# Patient Record
Sex: Female | Born: 1955 | Race: White | Hispanic: No | Marital: Married | Smoking: Former smoker
Health system: Southern US, Community
[De-identification: ages and names within clinical notes are randomized; demographics above are authoritative.]

## PROBLEM LIST (undated history)

## (undated) DIAGNOSIS — J310 Chronic rhinitis: Secondary | ICD-10-CM

## (undated) DIAGNOSIS — M797 Fibromyalgia: Secondary | ICD-10-CM

## (undated) DIAGNOSIS — E785 Hyperlipidemia, unspecified: Secondary | ICD-10-CM

## (undated) DIAGNOSIS — M810 Age-related osteoporosis without current pathological fracture: Secondary | ICD-10-CM

## (undated) DIAGNOSIS — E559 Vitamin D deficiency, unspecified: Secondary | ICD-10-CM

## (undated) DIAGNOSIS — C539 Malignant neoplasm of cervix uteri, unspecified: Secondary | ICD-10-CM

## (undated) DIAGNOSIS — E8801 Alpha-1-antitrypsin deficiency: Secondary | ICD-10-CM

## (undated) DIAGNOSIS — Z720 Tobacco use: Secondary | ICD-10-CM

## (undated) DIAGNOSIS — Z205 Contact with and (suspected) exposure to viral hepatitis: Secondary | ICD-10-CM

## (undated) DIAGNOSIS — G4733 Obstructive sleep apnea (adult) (pediatric): Secondary | ICD-10-CM

## (undated) DIAGNOSIS — K589 Irritable bowel syndrome without diarrhea: Secondary | ICD-10-CM

## (undated) DIAGNOSIS — J8410 Pulmonary fibrosis, unspecified: Secondary | ICD-10-CM

## (undated) DIAGNOSIS — K219 Gastro-esophageal reflux disease without esophagitis: Secondary | ICD-10-CM

## (undated) DIAGNOSIS — J449 Chronic obstructive pulmonary disease, unspecified: Secondary | ICD-10-CM

## (undated) HISTORY — DX: Fibromyalgia: M79.7

## (undated) HISTORY — DX: Age-related osteoporosis without current pathological fracture: M81.0

## (undated) HISTORY — DX: Alpha-1-antitrypsin deficiency: E88.01

## (undated) HISTORY — DX: Tobacco use: Z72.0

## (undated) HISTORY — PX: LUMBAR DISC SURGERY: SHX700

## (undated) HISTORY — DX: Contact with and (suspected) exposure to viral hepatitis: Z20.5

## (undated) HISTORY — DX: Malignant neoplasm of cervix uteri, unspecified: C53.9

## (undated) HISTORY — DX: Chronic rhinitis: J31.0

## (undated) HISTORY — DX: Vitamin D deficiency, unspecified: E55.9

## (undated) HISTORY — DX: Obstructive sleep apnea (adult) (pediatric): G47.33

## (undated) HISTORY — PX: ABDOMINAL HYSTERECTOMY: SHX81

## (undated) HISTORY — DX: Irritable bowel syndrome, unspecified: K58.9

## (undated) HISTORY — DX: Pulmonary fibrosis, unspecified: J84.10

## (undated) HISTORY — DX: Hyperlipidemia, unspecified: E78.5

## (undated) HISTORY — DX: Chronic obstructive pulmonary disease, unspecified: J44.9

## (undated) HISTORY — DX: Gastro-esophageal reflux disease without esophagitis: K21.9

---

## 1999-02-24 ENCOUNTER — Other Ambulatory Visit: Admission: RE | Admit: 1999-02-24 | Discharge: 1999-02-24 | Payer: Self-pay | Admitting: Family Medicine

## 2000-03-01 ENCOUNTER — Other Ambulatory Visit: Admission: RE | Admit: 2000-03-01 | Discharge: 2000-03-01 | Payer: Self-pay | Admitting: Family Medicine

## 2000-11-23 ENCOUNTER — Other Ambulatory Visit: Admission: RE | Admit: 2000-11-23 | Discharge: 2000-11-23 | Payer: Self-pay | Admitting: *Deleted

## 2000-11-23 ENCOUNTER — Encounter (INDEPENDENT_AMBULATORY_CARE_PROVIDER_SITE_OTHER): Payer: Self-pay | Admitting: Specialist

## 2000-12-01 ENCOUNTER — Inpatient Hospital Stay (HOSPITAL_COMMUNITY): Admission: EM | Admit: 2000-12-01 | Discharge: 2000-12-04 | Payer: Self-pay | Admitting: Emergency Medicine

## 2000-12-01 ENCOUNTER — Encounter (INDEPENDENT_AMBULATORY_CARE_PROVIDER_SITE_OTHER): Payer: Self-pay

## 2000-12-04 ENCOUNTER — Encounter: Payer: Self-pay | Admitting: Internal Medicine

## 2001-01-01 ENCOUNTER — Ambulatory Visit: Admission: RE | Admit: 2001-01-01 | Discharge: 2001-01-01 | Payer: Self-pay | Admitting: Gynecology

## 2001-01-03 ENCOUNTER — Encounter: Payer: Self-pay | Admitting: *Deleted

## 2001-01-08 ENCOUNTER — Encounter (INDEPENDENT_AMBULATORY_CARE_PROVIDER_SITE_OTHER): Payer: Self-pay

## 2001-01-08 ENCOUNTER — Inpatient Hospital Stay (HOSPITAL_COMMUNITY): Admission: RE | Admit: 2001-01-08 | Discharge: 2001-01-10 | Payer: Self-pay | Admitting: *Deleted

## 2001-01-22 ENCOUNTER — Ambulatory Visit: Admission: RE | Admit: 2001-01-22 | Discharge: 2001-01-22 | Payer: Self-pay | Admitting: Gynecology

## 2001-02-20 ENCOUNTER — Ambulatory Visit: Admission: RE | Admit: 2001-02-20 | Discharge: 2001-02-20 | Payer: Self-pay | Admitting: Gynecology

## 2001-04-23 ENCOUNTER — Other Ambulatory Visit: Admission: RE | Admit: 2001-04-23 | Discharge: 2001-04-23 | Payer: Self-pay | Admitting: Gynecology

## 2001-04-23 ENCOUNTER — Ambulatory Visit: Admission: RE | Admit: 2001-04-23 | Discharge: 2001-04-23 | Payer: Self-pay | Admitting: Gynecology

## 2001-08-14 ENCOUNTER — Ambulatory Visit: Admission: RE | Admit: 2001-08-14 | Discharge: 2001-08-14 | Payer: Self-pay | Admitting: Gynecology

## 2001-08-14 ENCOUNTER — Other Ambulatory Visit: Admission: RE | Admit: 2001-08-14 | Discharge: 2001-08-14 | Payer: Self-pay | Admitting: Gynecology

## 2002-02-10 ENCOUNTER — Other Ambulatory Visit: Admission: RE | Admit: 2002-02-10 | Discharge: 2002-02-10 | Payer: Self-pay | Admitting: *Deleted

## 2002-07-29 ENCOUNTER — Other Ambulatory Visit: Admission: RE | Admit: 2002-07-29 | Discharge: 2002-07-29 | Payer: Self-pay | Admitting: Gynecology

## 2002-07-29 ENCOUNTER — Encounter (INDEPENDENT_AMBULATORY_CARE_PROVIDER_SITE_OTHER): Payer: Self-pay | Admitting: Specialist

## 2002-07-29 ENCOUNTER — Ambulatory Visit: Admission: RE | Admit: 2002-07-29 | Discharge: 2002-07-29 | Payer: Self-pay | Admitting: Gynecology

## 2002-11-20 DIAGNOSIS — C539 Malignant neoplasm of cervix uteri, unspecified: Secondary | ICD-10-CM

## 2002-11-20 HISTORY — DX: Malignant neoplasm of cervix uteri, unspecified: C53.9

## 2002-12-04 ENCOUNTER — Ambulatory Visit (HOSPITAL_COMMUNITY): Admission: RE | Admit: 2002-12-04 | Discharge: 2002-12-04 | Payer: Self-pay | Admitting: Family Medicine

## 2002-12-04 ENCOUNTER — Encounter: Payer: Self-pay | Admitting: Family Medicine

## 2003-01-19 ENCOUNTER — Other Ambulatory Visit: Admission: RE | Admit: 2003-01-19 | Discharge: 2003-01-19 | Payer: Self-pay | Admitting: *Deleted

## 2003-08-05 ENCOUNTER — Encounter (INDEPENDENT_AMBULATORY_CARE_PROVIDER_SITE_OTHER): Payer: Self-pay | Admitting: Specialist

## 2003-08-05 ENCOUNTER — Ambulatory Visit: Admission: RE | Admit: 2003-08-05 | Discharge: 2003-08-05 | Payer: Self-pay | Admitting: Gynecology

## 2003-08-05 ENCOUNTER — Other Ambulatory Visit: Admission: RE | Admit: 2003-08-05 | Discharge: 2003-08-05 | Payer: Self-pay | Admitting: Gynecology

## 2003-09-19 ENCOUNTER — Emergency Department (HOSPITAL_COMMUNITY): Admission: EM | Admit: 2003-09-19 | Discharge: 2003-09-19 | Payer: Self-pay | Admitting: Emergency Medicine

## 2004-01-27 ENCOUNTER — Other Ambulatory Visit: Admission: RE | Admit: 2004-01-27 | Discharge: 2004-01-27 | Payer: Self-pay | Admitting: *Deleted

## 2004-08-05 ENCOUNTER — Encounter (INDEPENDENT_AMBULATORY_CARE_PROVIDER_SITE_OTHER): Payer: Self-pay | Admitting: *Deleted

## 2004-08-05 ENCOUNTER — Ambulatory Visit: Admission: RE | Admit: 2004-08-05 | Discharge: 2004-08-05 | Payer: Self-pay | Admitting: Gynecology

## 2004-11-29 ENCOUNTER — Ambulatory Visit (HOSPITAL_COMMUNITY): Admission: RE | Admit: 2004-11-29 | Discharge: 2004-11-29 | Payer: Self-pay | Admitting: Family Medicine

## 2005-02-08 ENCOUNTER — Other Ambulatory Visit: Admission: RE | Admit: 2005-02-08 | Discharge: 2005-02-08 | Payer: Self-pay | Admitting: *Deleted

## 2005-07-25 ENCOUNTER — Encounter (INDEPENDENT_AMBULATORY_CARE_PROVIDER_SITE_OTHER): Payer: Self-pay | Admitting: *Deleted

## 2005-07-25 ENCOUNTER — Ambulatory Visit: Admission: RE | Admit: 2005-07-25 | Discharge: 2005-07-25 | Payer: Self-pay | Admitting: Gynecologic Oncology

## 2005-07-25 ENCOUNTER — Other Ambulatory Visit: Admission: RE | Admit: 2005-07-25 | Discharge: 2005-07-25 | Payer: Self-pay | Admitting: Gynecologic Oncology

## 2005-07-31 ENCOUNTER — Ambulatory Visit (HOSPITAL_COMMUNITY): Admission: RE | Admit: 2005-07-31 | Discharge: 2005-07-31 | Payer: Self-pay | Admitting: Gynecologic Oncology

## 2006-03-22 ENCOUNTER — Other Ambulatory Visit: Admission: RE | Admit: 2006-03-22 | Discharge: 2006-03-22 | Payer: Self-pay | Admitting: *Deleted

## 2006-04-17 ENCOUNTER — Ambulatory Visit: Payer: Self-pay | Admitting: Internal Medicine

## 2006-05-15 ENCOUNTER — Ambulatory Visit: Payer: Self-pay | Admitting: Internal Medicine

## 2006-05-22 ENCOUNTER — Ambulatory Visit: Payer: Self-pay | Admitting: Internal Medicine

## 2006-05-30 ENCOUNTER — Ambulatory Visit: Payer: Self-pay | Admitting: Internal Medicine

## 2006-05-30 ENCOUNTER — Encounter (INDEPENDENT_AMBULATORY_CARE_PROVIDER_SITE_OTHER): Payer: Self-pay | Admitting: Specialist

## 2006-07-04 ENCOUNTER — Ambulatory Visit: Payer: Self-pay | Admitting: Internal Medicine

## 2006-07-11 ENCOUNTER — Ambulatory Visit: Payer: Self-pay | Admitting: Internal Medicine

## 2006-08-29 ENCOUNTER — Ambulatory Visit: Payer: Self-pay | Admitting: Internal Medicine

## 2006-12-03 ENCOUNTER — Ambulatory Visit: Payer: Self-pay | Admitting: Internal Medicine

## 2007-01-01 ENCOUNTER — Ambulatory Visit: Payer: Self-pay | Admitting: Internal Medicine

## 2007-06-03 ENCOUNTER — Other Ambulatory Visit: Admission: RE | Admit: 2007-06-03 | Discharge: 2007-06-03 | Payer: Self-pay | Admitting: *Deleted

## 2007-06-21 ENCOUNTER — Ambulatory Visit: Payer: Self-pay | Admitting: Internal Medicine

## 2007-09-14 DIAGNOSIS — M797 Fibromyalgia: Secondary | ICD-10-CM

## 2007-09-14 DIAGNOSIS — J449 Chronic obstructive pulmonary disease, unspecified: Secondary | ICD-10-CM

## 2007-09-14 HISTORY — DX: Chronic obstructive pulmonary disease, unspecified: J44.9

## 2007-12-06 ENCOUNTER — Ambulatory Visit: Payer: Self-pay | Admitting: Internal Medicine

## 2007-12-06 DIAGNOSIS — E8801 Alpha-1-antitrypsin deficiency: Secondary | ICD-10-CM

## 2008-04-17 ENCOUNTER — Encounter: Payer: Self-pay | Admitting: Internal Medicine

## 2008-06-02 ENCOUNTER — Ambulatory Visit: Payer: Self-pay | Admitting: Internal Medicine

## 2008-06-02 DIAGNOSIS — G4733 Obstructive sleep apnea (adult) (pediatric): Secondary | ICD-10-CM

## 2008-06-02 HISTORY — DX: Obstructive sleep apnea (adult) (pediatric): G47.33

## 2008-06-07 ENCOUNTER — Encounter: Payer: Self-pay | Admitting: Internal Medicine

## 2008-06-07 ENCOUNTER — Ambulatory Visit (HOSPITAL_BASED_OUTPATIENT_CLINIC_OR_DEPARTMENT_OTHER): Admission: RE | Admit: 2008-06-07 | Discharge: 2008-06-07 | Payer: Self-pay | Admitting: Internal Medicine

## 2008-06-10 ENCOUNTER — Emergency Department (HOSPITAL_COMMUNITY): Admission: EM | Admit: 2008-06-10 | Discharge: 2008-06-10 | Payer: Self-pay | Admitting: Emergency Medicine

## 2008-06-13 ENCOUNTER — Ambulatory Visit: Payer: Self-pay | Admitting: Internal Medicine

## 2008-06-23 ENCOUNTER — Ambulatory Visit: Payer: Self-pay | Admitting: Internal Medicine

## 2008-06-24 ENCOUNTER — Encounter: Payer: Self-pay | Admitting: Internal Medicine

## 2008-06-24 ENCOUNTER — Ambulatory Visit (HOSPITAL_COMMUNITY): Admission: RE | Admit: 2008-06-24 | Discharge: 2008-06-24 | Payer: Self-pay | Admitting: Family Medicine

## 2008-06-24 DIAGNOSIS — R0602 Shortness of breath: Secondary | ICD-10-CM | POA: Insufficient documentation

## 2008-07-09 ENCOUNTER — Ambulatory Visit: Payer: Self-pay | Admitting: Internal Medicine

## 2008-07-10 ENCOUNTER — Encounter: Payer: Self-pay | Admitting: Internal Medicine

## 2008-07-21 ENCOUNTER — Encounter: Payer: Self-pay | Admitting: Internal Medicine

## 2008-08-06 ENCOUNTER — Encounter: Payer: Self-pay | Admitting: Internal Medicine

## 2008-10-19 ENCOUNTER — Encounter: Payer: Self-pay | Admitting: Internal Medicine

## 2008-11-09 ENCOUNTER — Ambulatory Visit: Payer: Self-pay | Admitting: Internal Medicine

## 2008-12-28 ENCOUNTER — Encounter: Payer: Self-pay | Admitting: Internal Medicine

## 2009-03-31 ENCOUNTER — Encounter: Payer: Self-pay | Admitting: Internal Medicine

## 2009-04-05 ENCOUNTER — Encounter: Payer: Self-pay | Admitting: Internal Medicine

## 2009-04-13 ENCOUNTER — Encounter: Payer: Self-pay | Admitting: Internal Medicine

## 2009-05-11 ENCOUNTER — Encounter: Payer: Self-pay | Admitting: Internal Medicine

## 2009-05-21 ENCOUNTER — Encounter: Payer: Self-pay | Admitting: Internal Medicine

## 2009-05-30 ENCOUNTER — Encounter: Payer: Self-pay | Admitting: Internal Medicine

## 2009-07-13 ENCOUNTER — Encounter: Payer: Self-pay | Admitting: Internal Medicine

## 2009-07-23 ENCOUNTER — Encounter: Payer: Self-pay | Admitting: Internal Medicine

## 2009-10-08 ENCOUNTER — Encounter: Payer: Self-pay | Admitting: Internal Medicine

## 2009-11-22 ENCOUNTER — Ambulatory Visit: Payer: Self-pay | Admitting: Internal Medicine

## 2009-12-02 ENCOUNTER — Telehealth: Payer: Self-pay | Admitting: Internal Medicine

## 2009-12-08 ENCOUNTER — Telehealth: Payer: Self-pay | Admitting: Internal Medicine

## 2009-12-09 ENCOUNTER — Encounter: Payer: Self-pay | Admitting: Internal Medicine

## 2009-12-10 ENCOUNTER — Encounter: Payer: Self-pay | Admitting: Internal Medicine

## 2010-01-03 ENCOUNTER — Encounter: Payer: Self-pay | Admitting: Internal Medicine

## 2010-03-04 ENCOUNTER — Telehealth: Payer: Self-pay | Admitting: Internal Medicine

## 2010-03-07 ENCOUNTER — Encounter: Payer: Self-pay | Admitting: Internal Medicine

## 2010-04-01 ENCOUNTER — Encounter: Payer: Self-pay | Admitting: Internal Medicine

## 2010-05-18 ENCOUNTER — Encounter: Payer: Self-pay | Admitting: Internal Medicine

## 2010-05-31 ENCOUNTER — Encounter: Payer: Self-pay | Admitting: Internal Medicine

## 2010-07-10 ENCOUNTER — Encounter: Payer: Self-pay | Admitting: Internal Medicine

## 2010-08-01 ENCOUNTER — Telehealth (INDEPENDENT_AMBULATORY_CARE_PROVIDER_SITE_OTHER): Payer: Self-pay | Admitting: *Deleted

## 2010-09-23 ENCOUNTER — Encounter: Payer: Self-pay | Admitting: Internal Medicine

## 2010-10-09 ENCOUNTER — Encounter: Payer: Self-pay | Admitting: Internal Medicine

## 2010-11-22 ENCOUNTER — Ambulatory Visit
Admission: RE | Admit: 2010-11-22 | Discharge: 2010-11-22 | Payer: Self-pay | Source: Home / Self Care | Attending: Internal Medicine | Admitting: Internal Medicine

## 2010-11-22 DIAGNOSIS — Z720 Tobacco use: Secondary | ICD-10-CM

## 2010-11-22 HISTORY — DX: Tobacco use: Z72.0

## 2010-12-12 ENCOUNTER — Telehealth (INDEPENDENT_AMBULATORY_CARE_PROVIDER_SITE_OTHER): Payer: Self-pay | Admitting: *Deleted

## 2010-12-16 ENCOUNTER — Encounter
Admission: RE | Admit: 2010-12-16 | Discharge: 2010-12-16 | Payer: Self-pay | Source: Home / Self Care | Attending: Internal Medicine | Admitting: Internal Medicine

## 2010-12-22 NOTE — Miscellaneous (Signed)
Summary: Treatment Plan/[facility]  Treatment Plan/[facility]   Imported By: Sherian Rein 07/18/2010 09:26:55  _____________________________________________________________________  External Attachment:    Type:   Image     Comment:   External Document

## 2010-12-22 NOTE — Miscellaneous (Signed)
Summary: Treatment Plans/[facility]  Treatment Plans/[facility]   Imported By: Sherian Rein 10/12/2010 09:11:22  _____________________________________________________________________  External Attachment:    Type:   Image     Comment:   External Document

## 2010-12-22 NOTE — Progress Notes (Signed)
Summary: rx issue-  Phone Note Call from Patient Call back at Home Phone [phone]   Caller: Patient Call For: young Summary of Call: Pt states she doesn't know what the doxycycline is for, pls advise. Initial call taken by: Darletta Moll,  August 01, 2010 1:55 PM  Follow-up for Phone Call        ATC pt back at home number and this number has been d/c'ed. No other number in the system.  Will hol in triage to see if pt tried to call back again. Vernie Murders  August 01, 2010 3:04 PM  LM for pt's daughter, Bud Face at [phone]. Her # was listed in IDX. Asked that she get a msg to the pt and have her call us back. Pt's phone disconnected.  Follow-up by: Michel Bickers CMA,  August 02, 2010 10:45 AM  Additional Follow-up for Phone Call Additional follow up Details #1::        Patient says she does not need RX for the doxy. She received Zpak through her family doctor. RX request was sent from the pharmacy by mistake.Michel Bickers [facility]  August 02, 2010 11:06 AM  Pt's new number is [phone] and will be updated in her chart.Michel Bickers [facility]  August 02, 2010 11:07 AM

## 2010-12-22 NOTE — Progress Notes (Signed)
Summary: rx  Phone Note Call from Patient Call back at Home Phone [phone]   Caller: Patient Call For: Gerda Yin Reason for Call: Talk to Nurse Summary of Call: [facility] called last week re: needing a PA for pt's Zemaira.  Can you please check into this and see if it is being handled.  Pt doesn't have a treatment for today and [facility] is saying they don't have authorization for the meds. Initial call taken by: Eugene Gavia,  December 08, 2009 2:56 PM  Follow-up for Phone Call        Called  [facility] and spoke with [facility] and got PA approval for pt  Zemaira from 12-08-09 thru 12-08-10. Advised pharmacy of thsi as well as the patient. Approval number is [id number]. Carron Curie CMA  December 08, 2009 3:39 PM

## 2010-12-22 NOTE — Progress Notes (Signed)
Summary: prescription  Phone Note From Other Clinic Call back at [phone] ext [phone]   Caller: cindy/[facility] Summary of Call: Wants benadryl rx to go with vemeila infusion. Initial call taken by: Darletta Moll,  March 04, 2010 9:54 AM  Follow-up for Phone Call        Please advise on RX for Benadryl.Michel Bickers CMA  March 04, 2010 10:20 AM  Additional Follow-up for Phone Call Additional follow up Details #1::        Does she want by mouth benadryl ( 25 mg , 1-2 by mouth four times a day as needed , # 30, ref as needed)?   Or IV Benadryl ( 25 mg IV two times a day as needed) , # 8, ref as needed??  I think she wants this for nausea/ itching with her weekly IV Zemaira infusion. Additional Follow-up by: Waymon Budge MD,  March 04, 2010 1:45 PM    Additional Follow-up for Phone Call Additional follow up Details #2::    called and spoke with the pharmacists at [facility]---she stated the pt said she would just pick up the benadryl from the local pharmacy---pharmacy also stated that she has enough refills to last until july.   Randell Loop CMA  March 04, 2010 2:30 PM

## 2010-12-22 NOTE — Medication Information (Signed)
Summary: [facility]  [facility]   Imported By: Lester [place] 06/06/2010 10:45:45  _____________________________________________________________________  External Attachment:    Type:   Image     Comment:   External Document

## 2010-12-22 NOTE — Miscellaneous (Signed)
Summary: Plan/[facility]  Plan/[facility]   Imported By: Lester [place] 10/17/2010 09:30:30  _____________________________________________________________________  External Attachment:    Type:   Image     Comment:   External Document

## 2010-12-22 NOTE — Miscellaneous (Signed)
Summary: Physician Interim Order/[facility]  Physician Interim Order/[facility]   Imported By: Sherian Rein 12/16/2009 10:34:29  _____________________________________________________________________  External Attachment:    Type:   Image     Comment:   External Document

## 2010-12-22 NOTE — Miscellaneous (Signed)
Summary: Plan of Treatment/[facility]  Plan of Treatment/[facility]   Imported By: Sherian Rein 04/08/2010 14:16:13  _____________________________________________________________________  External Attachment:    Type:   Image     Comment:   External Document

## 2010-12-22 NOTE — Medication Information (Signed)
Summary: RX Folder  Registration Update   Imported By: Valinda Hoar 12/09/2009 08:01:52  _____________________________________________________________________  External Attachment:    Type:   Image     Comment:   External Document

## 2010-12-22 NOTE — Miscellaneous (Signed)
Summary: [facility] of Treatment[facility]  Plan of Treatment[facility]   Imported By: Sherian Rein 01/07/2010 10:31:49  _____________________________________________________________________  External Attachment:    Type:   Image     Comment:   External Document

## 2010-12-22 NOTE — Progress Notes (Signed)
Summary: prior auth  Phone Note From Pharmacy   Caller: [facility] Call For: [name]  Summary of Call: [name] needs Prior Auth.  [id number] Policy # [id number] Initial call taken by: Eugene Gavia,  December 02, 2009 12:11 PM  Follow-up for Phone Call        called number given and requested PA form be faxed. will give to CY once received. Carron Curie CMA  December 02, 2009 2:50 PM

## 2010-12-22 NOTE — Assessment & Plan Note (Signed)
Summary: ROV - OK PER KATIE ///KP   Primary Provider/Referring Provider:  Vernon Prey  CC:  Follow up visit-alpha 1 and COPD.[name]  History of Present Illness: 122109-COPD, a-AT deficiency had both flu vx. No recent acute problems. Stays with congested feeling. Using mucinex and more regular albuterol. Phlegm a little yellow or clear, but no blood, fever, chills, chest pain. Feet sometimes swell. More sensitive to odors but without nasal congestion or discharge. Can't tolerate Chantix. stopped smoking again about 2 weeks ago. Dr Christell Constant has cxr scheduled.  November 22, 2009- COPD, A-1aT deficiency Continhues Zemaira IV infusion once weekly with no problems. Sees Dr Christell Constant every 3 months for primary care. Got flu vax. Overall has felt stable, but has noted increased nose and chest congestion x 3 days, dry cough. Sore throat is better today. No fever. GI is ok. She expects a CXR once a year, but doesn't remember one. She uses rescue inhaler and Spiriva at home.  November 22, 2010- COPD, A-1aT deficiency, tobacco Nurse-CC: Follow up visit-alpha 1 and COPD. Continues weekly infusions for alpha 1 replacement through [facility]. She keeps some baseline cough and wheeze. Blames allergy for nasal congestion, hacking cough, sinus drip. Heat pump.  Unfortunately she still smokes some, but trying to taper off. We focused on this issue today.    Preventive Screening-Counseling & Management  Alcohol-Tobacco     Smoking Status: current     Smoking Cessation Counseling: yes     Smoke Cessation Stage: contemplative     Packs/Day: <0.25     Year Started: age 55     Tobacco Counseling: to quit use of tobacco products  Current Medications (verified): 1)  Spiriva Handihaler 18 Mcg  Caps (Tiotropium Bromide Monohydrate) .... Inhale Contents of 1 Capsule Once A Day 2)  Astelin 137 Mcg/spray  Soln (Azelastine Hcl) .... Take 2 Sprays in Each Nostril Twice A Day  As Needed 3)  Proair Hfa 108 (90 Base) Mcg/act  Aers  (Albuterol Sulfate) .... 2 Puffs Four Times A Day As Needed 4)  Fluoxetine Hcl 20 Mg Tabs (Fluoxetine Hcl) .... Take 1 Tablet By Mouth Once A Day 5)  Optivar 0.05 % Soln (Azelastine Hcl) .... Instill 1 Gtt Each Eye Two Times A Day 6)  Amitriptyline Hcl 75 Mg  Tabs (Amitriptyline Hcl) .... One At Bedtime 7)  Crestor 20 Mg  Tabs (Rosuvastatin Calcium) .... One By Mouth Qd 8)  Prevacid 30 Mg  Cpdr (Lansoprazole) .... Take 1 Capsule By Mouth Once A Day 9)  Zemaira 1000 Mg  Solr (Proteinase Inhibitor (Human)) .... 60 Mg/kg 10)  Hydrocodone-Acetaminophen 5-500 Mg Tabs (Hydrocodone-Acetaminophen) .... Take 1 Tablet By Mouth Two Times A Day 11)  Flutter To Help Clear Airways .[name].. 3-4 Reps, Three Times A Day 12)  Mucinex 600 Mg Xr12h-Tab (Guaifenesin) .... One Tab Once Daily 13)  Doxycycline Hyclate 100 Mg Tabs (Doxycycline Hyclate) .... 2 Today Then One Daily  Allergies: 1)  ! Erythromycin 2)  ! Iodine 3)  ! Morphine 4)  ! * Antiinflammatory Meds 5)  ! Jonne Ply  Past History:  Past Medical History: Last updated: 06/02/2008 cervical cancer A-1-AT COPD  Past Surgical History: Last updated: 12/06/2007 lumbar disk cage radical hysterectomy  Family History: Last updated: 11/09/2008 Sister Eber Jones also a1at, older sister been tested. Mother died pancreatic cancer MI/Heart Attack Osteoarthritis Brother died lung cancer  Social History: Last updated: 11/09/2008 Patient states former smoker.off and on, currently off.  disability  Risk Factors: Smoking Status:  current (11/22/2010) Packs/Day: <0.25 (11/22/2010)  Social History: Smoking Status:  current Packs/Day:  <0.25  Review of Systems      See HPI       The patient complains of shortness of breath with activity, non-productive cough, and nasal congestion/difficulty breathing through nose.  The patient denies shortness of breath at rest, productive cough, coughing up blood, chest pain, irregular heartbeats, acid heartburn,  indigestion, loss of appetite, weight change, abdominal pain, difficulty swallowing, sore throat, tooth/dental problems, headaches, sneezing, change in color of mucus, and fever.    Vital Signs:  Patient profile:   55 year old female Height:      65 inches Weight:      205.50 pounds BMI:     34.32 O2 Sat:      92 % on Room air Pulse rate:   75 / minute BP sitting:   136 / 70  (left arm) Cuff size:   large  Vitals Entered By: Reynaldo Minium CMA (November 22, 2010 9:44 AM)  O2 Flow:  Room air  Physical Exam  Additional Exam:  General: A/Ox3; pleasant and cooperative, NAD, room air SKIN: no rash, lesions NODES: no lymphadenopathy HEENT: [place]/AT, EOM- WNL, Conjuctivae- clear, PERRLA, TM-WNL, Nose- sniffing, Throat- clear and wnl, dentures, Mallampati   III NECK: Supple w/ fair ROM, JVD- none, normal carotid impulses w/o bruits Thyroid- normal to palpation, no stridor CHEST: Coarse rhonchi and raspy, wheezey laugh HEART: RRR, no m/g/r heard ABDOMEN: Soft and nl;  ZOX:WRUE, nl pulses, no edema  NEURO: Grossly intact to observation      Impression & Recommendations:  Problem # 1:  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   Problem # 2:  TOBACCO USER (ICD-305.1)  I am disappointed that she is still smoking. She is now trying to wean off gradually and we discussed support.   Problem # 3:  CHRONIC OBSTRUCTIVE PULMONARY DISEASE, SEVERE (ICD-496) Bronchitic wheezey congestion is evident on exam today, even though she considers herself comfortable and stable today.   Other Orders: Est. Patient Level III (45409)  Patient Instructions: 1)  Please schedule a follow-up appointment in 6 months. 2)  Please keep working at those cigarettes- they are bad for you and not worth the money ! 3)  cc Dr Christell Constant   Immunization History:  Influenza Immunization History:    Influenza:  historical (09/15/2010)

## 2010-12-22 NOTE — Miscellaneous (Signed)
Summary: [facility]  [facility]   Imported By: Lester [place] 03/10/2010 08:59:33  _____________________________________________________________________  External Attachment:    Type:   Image     Comment:   External Document

## 2010-12-22 NOTE — Assessment & Plan Note (Signed)
Summary: 12 months/apc   Primary Provider/Referring Provider:  Vernon Prey  CC:  Yearly Follow up.  States breathing is doing well overall.  having inc SOB with activity, head and chest congestion, dry cough, some wheezing, and and chest tightness x3 days. Marland Kitchen  History of Present Illness: 07/09/08- 55 year old woman returning for follow-up of COPD related to alpha one antitrypsin deficiency.  She continues IV replacement.  Stays short of breath with exertion, but feels better and is stable with no acute problems or concerns.  Took an antibiotic for a URI.  Two months ago at her primary office.  Stopped Advair because it caused yeast.  Has continued Spiriva and her rescue inhaler.  She has degenerative disk disease in her lumbar spine and is going for surgery evaluation. Returns for follow-up after her sleep study, which showed mild obstructive sleep apnea with an index of 6.9/hr only while supine.  Moderate snoring with desaturation to 71%.  We discussed conservative treatment.  Her mean oxygen saturation was only 89.3% during sleep.  We discussed potential need for home oxygen while sleeping. PFTs showed mild to moderate obstructive disease previously.  122109-COPD, a-AT deficiency had both flu vx. No recent acute problems. Stays with congested feeling. Using mucinex and more regular albuterol. Phlegm a little yellow or clear, but no blood, fever, chills, chest pain. Feet sometimes swell. More sensitive to odors but without nasal congestion or discharge. Can't tolerate Chantix. stopped smoking again about 2 weeks ago. Dr Christell Constant has cxr scheduled.  November 22, 2009- COPD, A-1aT deficiency Continhues Zemaira IV infusion once weekly with no problems. Sees Dr Christell Constant every 3 months for primary care. Got flu vax. Overall has felt stable, but has noted increased nose and chest congestion x 3 days, dry cough. Sore throat is better today. No fever. GI is ok. She expects a CXR once a year, but doesn't remember  one. She uses rescue inhaler and Spiriva at home.    Current Medications (verified): 1)  Spiriva Handihaler 18 Mcg  Caps (Tiotropium Bromide Monohydrate) .... Inhale Contents of 1 Capsule Once A Day 2)  Astelin 137 Mcg/spray  Soln (Azelastine Hcl) .... Take 2 Sprays in Each Nostril Twice A Day  As Needed 3)  Proair Hfa 108 (90 Base) Mcg/act  Aers (Albuterol Sulfate) .... 2 Puffs Four Times A Day As Needed 4)  Fluoxetine Hcl 20 Mg Tabs (Fluoxetine Hcl) .... Take 1 Tablet By Mouth Once A Day 5)  Optivar 0.05 % Soln (Azelastine Hcl) .... Instill 1 Gtt Each Eye Two Times A Day 6)  Amitriptyline Hcl 75 Mg  Tabs (Amitriptyline Hcl) .... One At Bedtime 7)  Crestor 20 Mg  Tabs (Rosuvastatin Calcium) .... One By Mouth Qd 8)  Prevacid 30 Mg  Cpdr (Lansoprazole) .... Take 1 Capsule By Mouth Once A Day 9)  Zemaira 1000 Mg  Solr (Proteinase Inhibitor (Human)) .... 60 Mg/kg 10)  Robinul 1 Mg Tabs (Glycopyrrolate) .... Take 2 Tablets Once Daily As Needed 11)  Lovaza 1 Gm Caps (Omega-3-Acid Ethyl Esters) .... Take 1 Capsule By Mouth Two Times A Day 12)  Hydrocodone-Acetaminophen 5-500 Mg Tabs (Hydrocodone-Acetaminophen) .... Take 1 Tablet By Mouth Two Times A Day 13)  Flutter To Help Clear Airways .Marland Kitchen.. 3-4 Reps, Three Times A Day 14)  Mucinex 600 Mg Xr12h-Tab (Guaifenesin) .... One Tab Once Daily  Allergies (verified): 1)  ! Erythromycin 2)  ! Iodine 3)  ! Morphine 4)  ! * Antiinflammatory Meds  Past History:  Past  Medical History: Last updated: 06/02/2008 cervical cancer A-1-AT COPD  Past Surgical History: Last updated: 12/06/2007 lumbar disk cage radical hysterectomy  Family History: Last updated: 11/09/2008 Sister Eber Jones also a1at, older sister been tested. Mother died pancreatic cancer MI/Heart Attack Osteoarthritis Brother died lung cancer  Social History: Last updated: 11/09/2008 Patient states former smoker.off and on, currently off.  disability  Risk Factors: Smoking  Status: quit (12/06/2007)  Review of Systems      See HPI       The patient complains of dyspnea on exertion and prolonged cough.  The patient denies anorexia, fever, weight loss, weight gain, vision loss, decreased hearing, hoarseness, chest pain, syncope, peripheral edema, hemoptysis, abdominal pain, and severe indigestion/heartburn.    Vital Signs:  Patient profile:   55 year old female Height:      65 inches Weight:      200 pounds BMI:     33.40 O2 Sat:      91 % on Room air Pulse rate:   81 / minute BP sitting:   132 / 84  (right arm) Cuff size:   regular  Vitals Entered By: Gweneth Dimitri RN (November 22, 2009 3:46 PM)  O2 Flow:  Room air CC: Yearly Follow up.  States breathing is doing well overall.  having inc SOB with activity, head and chest congestion, dry cough, some wheezing, and chest tightness x3 days.  Comments Medications reviewed with patient Gweneth Dimitri RN  November 22, 2009 3:47 PM    Physical Exam  Additional Exam:  General: A/Ox3; pleasant and cooperative, NAD, room air SKIN: no rash, lesions NODES: no lymphadenopathy HEENT: [place]/AT, EOM- WNL, Conjuctivae- clear, PERRLA, TM-WNL, Nose- sniffing, Throat- clear and wnl, dentures, [place]  III NECK: Supple w/ fair ROM, JVD- none, normal carotid impulses w/o bruits Thyroid- normal to palpation CHEST: Cough with blateral fine rhonchi HEART: RRR, no m/g/r heard ABDOMEN: Soft and nl;  ZOX:WRUE, nl pulses, no edema  NEURO: Grossly intact to observation      Impression & Recommendations:  Problem # 1:  CHRONIC OBSTRUCTIVE PULMONARY DISEASE, SEVERE (ICD-496) Acute exacerbation of chronic bronchitis. We will give neb and depo, CXR, antibiotic.  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 One tab once daily 2)   Doxycycline Hyclate 100 Mg Tabs (Doxycycline hyclate) .... 2 today then one daily  Other Orders: Est. Patient Level III (45409) Admin of Therapeutic Inj  intramuscular or subcutaneous (81191) Depo- Medrol 80mg  (J1040) Nebulizer Tx (47829) T-2 View CXR (71020TC)  Patient Instructions: 1)  Schedule return in one year, earlier if needed 2)  neb xop 1.25 3)  depo 80 4)  Script to hold for antibiotic doxycycline 5)  A chest x-ray has been recommended.  Your imaging study may require preauthorization.  Prescriptions: DOXYCYCLINE HYCLATE 100 MG TABS (DOXYCYCLINE HYCLATE) 2 today then one daily  #8 x 0   Entered and Authorized by:   Waymon Budge MD   Signed by:   Waymon Budge MD on 11/22/2009   Method used:   Print then Give to Patient   RxID:   [id number]    Medication Administration  Injection # 1:    Medication: Depo- Medrol 80mg     Diagnosis: CHRONIC OBSTRUCTIVE PULMONARY DISEASE, SEVERE (ICD-496)    Route: SQ    Site: LUOQ gluteus  Exp Date: 08/2010    Lot #: [id number]    Mfr: Teva    Patient tolerated injection without complications    Given by: Vivianne Spence 11/22/2009  Medication # 1:    Medication: Xopenex 1.25mg     Diagnosis: CHRONIC OBSTRUCTIVE PULMONARY DISEASE, SEVERE (ICD-496)    Dose: 1 vial     Route: inhaled    Exp Date: 04/2010    Lot #: [id number]    Mfr: Sepracor    Patient tolerated medication without complications    Given by: Vivianne Spence 11/22/2009  Orders Added: 1)  Est. Patient Level III [91478] 2)  Admin of Therapeutic Inj  intramuscular or subcutaneous [96372] 3)  Depo- Medrol 80mg  [J1040] 4)  Nebulizer Tx [94640] 5)  T-2 View CXR [71020TC]

## 2010-12-30 ENCOUNTER — Encounter: Payer: Self-pay | Admitting: Internal Medicine

## 2011-01-05 NOTE — Medication Information (Signed)
Summary: Tax adviser   Imported By: Valinda Hoar 12/30/2010 16:32:38  _____________________________________________________________________  External Attachment:    Type:   Image     Comment:   External Document

## 2011-01-05 NOTE — Progress Notes (Signed)
Summary: prior auth for Zemaira-APPROVED 12-24-10 [facility] 11-20-2011  Phone Note From Pharmacy   Caller: ALISA WITH [facility] Call For: DR YOUNG  Summary of Call: Alisa with [facility] phoned she needs prior auth for Zemaira 60 mg per kg. Part D usually authorizes this for one year at a time and the one for this year expired on 12/08/2010. Her last shipment went out on the 19th. Serina Cowper can be reached at [phone]. Part B can be reached at [phone] for authorizations patients policy number [id number] Initial call taken by: Vedia Coffer,  December 12, 2010 11:32 AM  Follow-up for Phone Call        will forward to PA folder. Boone Master CNA/MA  December 12, 2010 3:10 PM    Serina Cowper called back to day to ck on the status of PA-was unaware that PA needed to be done; I called PA Dept at Insurance company-approved from 12-24-10 through 11-20-2011; Alisa at [facility] is aware.Reynaldo Minium CMA  December 28, 2010 12:19 PM

## 2011-01-06 ENCOUNTER — Encounter: Payer: Self-pay | Admitting: Internal Medicine

## 2011-01-15 ENCOUNTER — Encounter: Payer: Self-pay | Admitting: Internal Medicine

## 2011-01-17 NOTE — Miscellaneous (Signed)
Summary: Treatment Plans/[facility]  Treatment Plans/[facility]   Imported By: Sherian Rein 01/13/2011 10:34:32  _____________________________________________________________________  External Attachment:    Type:   Image     Comment:   External Document

## 2011-01-26 NOTE — Miscellaneous (Signed)
Summary: Certification & Plan of Care /[facility]  Certification & Plan of Care /[facility]   Imported By: Lennie Odor 01/18/2011 11:12:11  _____________________________________________________________________  External Attachment:    Type:   Image     Comment:   External Document

## 2011-04-04 NOTE — Procedures (Signed)
NAMERECIA, SONS              ACCOUNT NO.:  [id number]   MEDICAL RECORD NO.:  [id number]          PATIENT TYPE:  OUT   LOCATION:  SLEEP CENTER                 FACILITY[facility]  [facility]   PHYSICIAN:  Clinton D. Maple Hudson, MD, FCCP, FACPDATE OF BIRTH:  02/01/58   DATE OF STUDY:  06/07/2008                            NOCTURNAL POLYSOMNOGRAM   REFERRING PHYSICIAN:   REFERRING PHYSICIAN:  Clinton D. Maple Hudson, M.D, [facility], FACP.   INDICATION FOR STUDY:  Hypersomnia with sleep apnea.   EPWORTH SLEEPINESS SCORE:  8/24, BMI 36.6, weight 220 pounds.  Height 65  inches.  Neck 15 inches.   HOME MEDICATIONS:  Charted and reviewed.   SLEEP ARCHITECTURE:  Total sleep time 328 minutes with sleep efficiency  71%.  Stage I was 1.5%.  Stage II 91.9%.  Stage III absent.  REM 6.5% of  total sleep time.  Sleep latency 117 minutes.  REM latency 196 minutes.  Awake after sleep onset 15.5 minutes.  Arousal index 5.3.  No bedtime  medication was taken.   RESPIRATORY DATA:  Apnea-hypopnea index (AHI) 6.9 per hour.  There were  38 events scored, including 6 obstructive apneas, 1 mixed apnea, and 31  hypopneas.  Events were associated with supine sleep position.  REM AHI  47.4.  There were insufficient events to qualify for CPAP titration by  split protocol on this study night.   OXYGEN DATA:  Moderately loud snoring with oxygen desaturation to a  nadir of 71%.  Mean oxygen saturation through the study was 89.3% on  room air, indicating underlying cardiopulmonary disease.   CARDIAC DATA:  Normal sinus rhythm.   MOVEMENT-PARASOMNIA:  Occasional limb jerk with arousal, insignificant.  No bathroom trips.   IMPRESSIONS-RECOMMENDATIONS:  1. Mild obstructive sleep apnea/hypopnea syndrome, apnea-hypopnea      index 6.9 per hour.  Events were limited to supine sleep position.      Moderate snoring with oxygen desaturation to a nadir of 71%.  2. Scores in this range are not usually addressed first with CPAP.  Consider conservative measures and evaluate for alternative      management as appropriate.  3. Low mean oxygen saturation on room air through the study, 89.3%,      suggesting underlying cardiopulmonary disease.      Clinton D. Maple Hudson, MD, FCCP, FACP  Diplomate, Biomedical engineer of Sleep Medicine  Electronically Signed     CDY/MEDQ  D:  06/13/2008 11:09:26  T:  06/13/2008 11:23:36  Job:  [id number]

## 2011-04-04 NOTE — Assessment & Plan Note (Signed)
[facility]                             PULMONARY OFFICE NOTE   NAME:[name], [name]                     MRN:          [id number]  DATE:06/21/2007                            DOB:          [birth year]    PROBLEM:  1. Severe COPD.  2. Alpha 1 antitrypsin deficiency.  3. Fibromyalgia.   HISTORY:  She has been off of cigarette for about 2 months and I  congratulated her. She feel fairly well trying to avoid the summer heat.  She likes Astelin nasal spray which works quite well for her. She  stopped Advair because it caused thrush.  [facility] continues to give  intervenous Zemaira infusion every Wednesday, well tolerated. Little  sputum, no chest pain. No acute events.   MEDICATIONS:  1. Spiriva once daily.  2. Prozac 40 mg.  3. Amitriptyline 75 mg at bedtime.  4. Aciphex 20 mg.  5. Crestor 20 mg.  6. Prevacid 30 mg.  7. Zemaira 60 mg per kilogram.  8. P.r.n. use of Astelin nasal spray.  9. Fish oil.  10.Albuterol rescue inhaler.   DRUG INTOLERANT:  ERYTHROMYCIN, IODINE, MORPHINE, ANTIINFLAMMATORY  DRUGS.   OBJECTIVE:  Weight 212 pounds, blood pressure 124/76, pulse 81, room air  saturation 95%. Wet congested rattle.  HEART SOUNDS: Regular without murmur.  There is no neck vein distension or strider. She is not coughing or  laboring.  There is no cyanosis or edema.   IMPRESSION:  Severe chronic obstructive pulmonary disease with a chronic  bronchitic component evident from the airway noise. The underlying  problem is alpha 1 antitrypsin deficiency.   PLAN:  Support cigarette abstinence with discussions with her. Consider  Symbicort with a spacer if we need to replace the Advair. Schedule  return 6 months.     Clinton D. Maple Hudson, MD, Tonny Bollman, FACP  Electronically Signed    CDY/MedQ  DD: 06/22/2007  DT: 06/22/2007  Job #: [id number]   cc:   Ernestina Penna, M.D.  Areatha Keas, M.D.

## 2011-04-07 NOTE — Consult Note (Signed)
NAMEDESARAY, MARSCHNER              ACCOUNT NO.:  [id number]   MEDICAL RECORD NO.:  [id number]          PATIENT TYPE:  OUT   LOCATION:  GYN                          FACILITY:  [facility]   PHYSICIAN:  De Blanch, M.D.DATE OF BIRTH:  1956/04/16   DATE OF CONSULTATION:  08/06/2004  DATE OF DISCHARGE:                                   CONSULTATION   A 55 year old white female returns for continuing follow-up of cervical  cancer.   INTERVAL HISTORY:  Since her last visit, the patient has done well.  She  denies any GI symptoms.  She does have some urinary hesitancy which seems to  be new.  She does not have any dysuria, flank pain, or fever.  She denies  any other pelvic pain, pressure, vaginal bleeding, or discharge.  She has  had some constipation resulting in a small tear in the anus which bled for 2  days.   HISTORY OF PRESENT ILLNESS:  The patient underwent a radical hysterectomy in  February 2002.  She had low risk disease and received no further therapy.   CURRENT MEDICATIONS:  Amitriptyline, Prozac, Pepcid, albuterol inhaler,  Darvocet-n   PAST MEDICAL ILLNESSES:  1.  Asthma.  2.  Gastroesophageal reflux disease.  3.  Fibromyalgia.   PAST SURGICAL HISTORY:  Radical hysterectomy.   DRUG ALLERGIES:  None.   REVIEW OF SYSTEMS:  Negative except as noted above.Marland Kitchen   PHYSICAL EXAMINATION:  VITAL SIGNS:  Weight 180 pounds.  GENERAL:  The patient is a healthy white female in no acute distress.  HEENT:  Negative.  NECK:  Supple without thyromegaly.  There is no supraclavicular or inguinal  adenopathy.  ABDOMEN:  Soft, nontender.  No mass, organomegaly, ascites, or hernias are  noted.  Her Pfannenstiel incision is well healed.  PELVIC:  EG//BUS, vagina, bladder, urethra are normal.  Vaginal cuff is well  healed.  No lesions are noted.  She has good vaginal depth and caliber.  Palpation over the bladder reveals no symptoms.  RECTAL EXAM:  Deferred.   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  No evidence of recurrent disease.  Anal  tear secondary to constipation.  The patient is encouraged to use more stool  softeners.   Because of her urinary hesitancy, we will obtain an urine culture to make  certain she does not have a urinary tract infection.  She will return to see  Dr. Jeanine Luz in 6 months and return to see [place] in 1 year.     Dani   DC/MEDQ  D:  08/05/2004  T:  08/06/2004  Job:  [id number]   cc:   Almedia Balls. Fore, M.D.  [phone] [address]. [address] [place]  [place] [address]  Fax: [phone]   Telford Nab, R.N.  [phone] N. [address]  [place], [place] [address]

## 2011-04-07 NOTE — Consult Note (Signed)
[facility]  Patient:    [name], [name]                  MRN: [id number] Proc. Date: 02/20/01 Adm. Date:  [id number] Disc. Date: [id number] Attending:  Jeannette Corpus CC:         Almedia Balls. Randell Patient, M.D.  Jeralyn Bennett, M.D.  Telford Nab, R.N.   Consultation Report  HISTORY OF PRESENT ILLNESS:  [age]-year-old white female returns for continuing followup of carcinoma of the cervix.  She underwent a radical hysterectomy and pelvic lymphadenectomy on February 19th.  She was found to have a poorly differentiated squamous cell carcinoma invading 7 mm.  There were no margins involved and all lymph nodes were negative.  The patient had her suprapubic catheter removed approximately two weeks following surgery. Since that time, she has done well.  She has some loss of sensation of the bladder but feels like she is emptying completely when she does.  She has had slight overflow incontinence on one or two occasions.  She denies any fever, chills or flank pain.  She has no GI or GU symptoms.  Overall, she feels quite well.  REVIEW OF SYSTEMS:  Review of systems is negative except as noted in history of present illness.  FAMILY HISTORY AND SOCIAL HISTORY:  Reviewed and unchanged.  PHYSICAL EXAMINATION:  GENERAL:  Weight 184 pounds.  ABDOMEN:  The abdomen is soft and nontender.  No mass, organomegaly, ascites or herniae are noted.  Her transverse incision is well-healed.  PELVIC:  EG/BUS normal.  Vagina is clean and well-supported.  The cuff is healing, although there is a slight amount of granulation in this area. Bimanual exam reveals some postoperative induration.  No masses or nodularity are noted.  IMPRESSION:  Status post radical hysterectomy for a stage IB1 squamous cell carcinoma of the cervix.  The patient is given the okay to return to full levels of activity.  I would like her to abstain from sexual intercourse for one more  week.  She will return to see me in three months for continuing followup and we will initiate Pap smear followup at that time.  DD:  02/20/01 TD:  02/21/01 Job: [id number]

## 2011-04-07 NOTE — Discharge Summary (Signed)
[facility]  Patient:    [name], [name]                     MRN: [id number] Adm. Date:  [id number] Disc. Date: [id number] Attending:  Estella Husk Dictator:   Mike Gip, P.A. CC:         Monica Becton, M.D.             Almedia Balls. Randell Patient, M.D.             Daniel L. Clarke-Pearson, M.D.                           Discharge Summary  ADMISSION DIAGNOSES: 1. [age]-year-old white female with acute lower gastrointestinal bleed,    rule out rectal involvement with gynecologic cancer, rule out ischemic    colitis, rule out occult colonic lesion or diverticular bleeding. 2. New diagnosis of cervical cancer.  Surgical evaluation pending. 3. Asthma. 4. Gastroesophageal reflux disease. 5. Fibromyalgia.  DISCHARGE DIAGNOSES: 1. Acute splenic flexure ischemic colitis, mild. 2. Cervical cancer.  Surgical intervention pending. 3. Asthma. 4. Gastroesophageal reflux disease. 5. Fibromyalgia.  CONSULTATIONS:  Dr. Stanford Breed, GYN oncology.  PROCEDURES: 1. Colonoscopy. 2. CT scan of the abdomen and pelvis.  HISTORY OF PRESENT ILLNESS:  [name] is a pleasant 55 year old white female, primary patient of Dr. Vernon Prey, also known to Dr. Randell Patient with history of fibromyalgia, asthma, and reflux.  The patient has not had any prior other GI problems.  She has been having some recent vaginal bleeding and had undergone laparoscopy and hysteroscopy as well as D&C approximately one week ago with Dr. Randell Patient.  She was found to have endometriosis and also what sounds like cervical cancer.  She is scheduled to see Dr. Stanford Breed on Tuesday, December 04, 2000.  The patient had not noted any recent changes in her bowel habits, abdominal pain, cramping, etc.  She had onset on Friday, November 30, 2000, in the evening with an episode of abdominal cramping, diaphoresis, and then diarrhea but did not notice blood initially.  About 4:30 a.m. she was then awakened  with another episode of abdominal cramping, followed by burgundy stool.  She had several episodes of this over the next few hours, and then it stopped.  This was associated with sweats and lightheadedness and recurred again last evening with three further episodes of burgundy stool, and she presented to the emergency room.  She was initially seen and evaluated by Dr. Beverely Pace, found to have a large amount of burgundy heme on rectal exam, though hemoglobin was 14.7, WBC of 12.4.  GI was called, and the patient is admitted to the hospital for further diagnostic workup.  LABORATORY DATA:  Again, on December 01, 2000, WBC of 12.4, hemoglobin 14.7, hematocrit of 41.4, MCV of 87.9, platelets 314.  Serial laboratories were obtained.  On December 04, 2000, hemoglobin 14.2, hematocrit of 39.4.  Protime 13.3, INR of 1.1, PTT of 33.  Electrolytes within normal limits.  BUN less than 5, creatinine 0.5, albumin 4.3.  Liver function studies normal. Urinalysis did show 6-10 wbcs and many bacteria.  X-ray studies:  CT scan of the abdomen and pelvis done on December 04, 2000, shows scarring in the lung bases bilaterally.  There are some areas of nodularity associated with pleural parenchymal scarring, likely representing scarring; however, recommended follow-up in four to six months.  There was no evidence of metastatic disease in the  abdomen.  There was noted some indistinctness around the cervix, difficult to interpret local invasion by CT scan, and further evaluation by MRI may be beneficial.  Remainder of the uterus was unremarkable.  There was some mild stranding also in the perirectal fat.  HOSPITAL COURSE:  The patient was admitted to the service of Dr. Yancey Flemings, who was covering the hospital for GI.  She was initially started on clear liquid diet, started on IV fluids, given Phenergan and Demerol as needed for pain and nausea.  She was started on Pepcid, and serial H&Hs were obtained. The following  morning the patient had not had any further episodes of hematochezia and was feeling better.  Her hemoglobin had remained stable at 13.5 despite her episodes of hematochezia.  The decision was made to proceed with colonoscopy for diagnostic purposes, especially in light of the fact that she had had a recent diagnosis of cervical cancer.  She was prepped for colonoscopy and tolerated this without difficulty on December 03, 2000.  She was found to have a mild segmental ischemic colitis of the splenic flexure. Biopsies were taken and, again, were consistent with an ischemic colitis. There was no evidence of rectal involvement of her cervical cancer on colonoscopy.  We also obtained CT scan of the abdomen and pelvis prior to discharge, with findings as outlined above.  The patient was seen in consultation while in the hospital with Dr. Stanford Breed, and arrangements are in the works regarding surgical intervention.  Apparently, there is some difficulty currently with coverage of Dr. Karie Kirks service by this patients insurance.  By December 04, 2000, the patient was felt to be stable for discharge from the hospital.  Was discharged with instructions to follow up with Dr. Randell Patient and Dr. Stanford Breed as arranged and with GI on a p.r.n. basis.  She was advised to keep herself well hydrated and to avoid aspirin and NSAIDs over the next couple of weeks.  We did not add any new medications.  DISCHARGE MEDICATIONS:  As previous, including: 1. Amitriptyline 75 q.h.s. 2. Prozac 20 p.o. q.d. 3. Pepcid 40 p.o. q.d. 4. Albuterol inhaler 2 puffs as needed. 5. Darvocet-N 100 every six hours as needed for pain.  CONDITION ON DISCHARGE:  Stable. DD:  12/11/00 TD:  12/11/00 Job: [id number]

## 2011-04-07 NOTE — Consult Note (Signed)
[facility]  Patient:    [name], [name]                  MRN: [id number] Proc. Date: 01/01/01 Adm. Date:  [id number] Attending:  Jeannette Corpus CC:         Almedia Balls. Randell Patient, M.D.  Wilhemina Bonito. Eda Keys., M.D. [facility]  Telford Nab, R.N.   Consultation Report  HISTORY:  Patient returns today for further preoperative preparation.  We have worked closely with her insurance company, who has now approved for her to go ahead with the radical hysterectomy for a stage IB-1 poorly differentiated squamous cell carcinoma of the cervix.  I initially saw the patient on January 15th.  Since that time, she has had no rectal bleeding and denies any vaginal bleeding.  The patients past medical history, surgical history and review of systems are reviewed.  Only modifications from my note on January 15th is that patient has had back surgery in the past and currently takes Motrin.  It is interesting that she claims an allergic reaction to CELEBREX and VIOXX but not to Motrin, which she takes quite readily.  PHYSICAL EXAMINATION  VITAL SIGNS:  Weight 183 pounds.  Blood pressure 120/70, pulse 88, respiratory rate 16.  GENERAL:  The patient is a healthy white female in no acute distress.  HEENT:  Negative.  NECK:  Supple without thyromegaly.  LYMPHATICS:  There is no supraclavicular, axillary or inguinal adenopathy.  ABDOMEN:  Soft and nontender.  No mass, organomegaly, ascites or herniae are noted.  She does have a well-healed scar over her lumbar vertebrae.  PELVIC:  EG/BUS normal.  Vagina is clean.  Cervix is apparently normal, however, on bimanual examination, it is hard, measuring approximately 3 cm across.  There is no apparent parametrial involvement.  Rectovaginal exam confirms.  IMPRESSION:  Stage IB-1 cervical carcinoma; patient also apparently has associated endometriosis, based on Dr. Almedia Balls. Fores laparoscopy.  We discussed the pros and  cons of radiation versus surgery and the patient continues to wish to have surgical approach.  We would recommend she undergo a radical hysterectomy and pelvic lymphadenectomy.  If her ovaries appear normal, we would preserve them; on the other hand, if she has severe endometriosis or bad adhesions, we would proceed with bilateral salpingo-oophorectomy.  She is aware of this need for intraoperative decision making and is in agreement with our best opinion at the time of surgery.  She wishes to proceed with surgery next week.  The risks of surgery including hemorrhage or infection, injury to adjacent viscera and thromboembolic complications are outlined to the patient.  We also emphasized the problems with bladder dysfunction following radical hysterectomy, that she will need to have a suprapubic catheter for a period of possibly several weeks after surgery and that she may require intermittent self-catheterization.  She is in agreement with this plan and these potential risks.  We will proceed with surgery, operating with Dr. Jeanine Luz on Tuesday, February 19th. DD:  01/01/01 TD:  01/02/01 Job: [id number]

## 2011-04-07 NOTE — Assessment & Plan Note (Signed)
[facility]                               PULMONARY OFFICE NOTE   NAME:[name], [name]                     MRN:          [id number]  DATE:08/29/2006                            DOB:          November 01, 1956    PROBLEM LIST:  1. Alpha 1 antitrypsin deficiency (homozygous ZZ).  2. Severe chronic obstructive pulmonary disease.  3. History of cervical cancer.  4. Irritable bowel.  5. Esophageal reflux and chronic gastritis (Dr. Marina Goodell).  6. Fibromyalgia.   HISTORY OF PRESENT ILLNESS:  She returns for follow-up having started  proteinase inhibitor replacement with Zemaira once a week through [employer] [id number]) after her August visit.  She had endoscopies by  Dr. Marina Goodell and was continued on Prevacid with reflux precautions.  She says  she still feels tired on the day of her Zemaira infusion each week, but that  is an improvement.  Originally she was also having some nausea and headache,  but that is improved by taking Benadryl.  She is not smoking now.  Cough  brings up a little yellow sputum.  There is no chest pain or edema.  She  will get her flu vaccine at Dr. Kathi Der office.  Her sister is also a  homozygote ZZ on replacement therapy.  Two other siblings are followed in  this pulmonary group and I have alerted their physician to screen them as  well.   MEDICATIONS:  1. Spiriva.  2. Nasacort.  3. Advair 250/50.  4. Prozac 40 mg.  5. Amitriptyline 75 mg.  6. AcipHex 20 mg.  7. Crestor 20 mg.  8. Prevacid 30 mg.  9. Zemaira 60 mg/kg IV once a week.   ALLERGIES:  Intolerant of ERYTHROMYCIN, IODINE, MORPHINE, ANTI-INFLAMMATORY  DRUGS.  She has an albuterol rescue inhaler and takes occasional  hydrocodone 5/500 for her diagnosis of fibromyalgia.   OBJECTIVE:  VITAL SIGNS:  Weight 213 pounds, blood pressure 108/78, pulse  regular at 71, room air saturation 94% at rest.  CHEST:  There is minimal chest congestion and a few scattered  crackles.  Breath sounds overall are distant and unlabored.  NECK:  There is no neck vein distention or peripheral edema.  I do not find  adenopathy or clubbing.  HEART:  Sounds are regular, best heard at the xiphoid, and without murmur or  gallop.   IMPRESSION:  She has severe chronic obstructive pulmonary disease directly  related to past smoking history and Alpha 1 antitrypsin deficiency, but is  clinically stable and improved now off of cigarettes.   PLAN:  She will return to routine follow-up through Dr. Lowanda Foster at [facility].  She can be seen here once a year to maintain  follow-up of her intravenous Zemaira therapy, earlier p.r.n.       Clinton D. Maple Hudson, MD, [facility], FACP      CDY/MedQ  DD:  08/29/2006  DT:  08/31/2006  Job #:  [id number]   cc:   Ernestina Penna, M.D.  Caryl Comes. Slotnick, M.D.  Almedia Balls. Randell Patient, M.D.  Areatha Keas, M.D.  Wilhemina Bonito. Eda Keys., MD

## 2011-04-07 NOTE — Consult Note (Signed)
   NAME:  [name], [name]                     ACCOUNT NO.:  [id number]   MEDICAL RECORD NO.:  [id number]                   PATIENT TYPE:  OUT   LOCATION:  GYN                                  FACILITY:  [facility]   PHYSICIAN:  De Blanch, M.D.         DATE OF BIRTH:  1955-12-19   DATE OF CONSULTATION:  08/05/2003  DATE OF DISCHARGE:                                   CONSULTATION   REASON FOR CONSULTATION:  A 55 year old white female returns for continuing  follow-up, having undergone a radical hysterectomy February 2002.   INTERVAL HISTORY:  Since her last visit she has done well.  She denies any  GI or GU symptoms.  There is no pelvic pain, pressure, vaginal bleeding, or  discharge.  She saw Dr. Jeanine Luz on January 19, 2003 at which time her exam  and findings were good.   Family history and social history are reviewed and unchanged.  She comes  accompanied by her husband who has recently had wrist surgery and has had to  stop his job as a Agricultural consultant.   REVIEW OF SYSTEMS:  Negative except as noted above.  The patient does have  fibromyalgia.   PHYSICAL EXAMINATION:  GENERAL:  The patient is a healthy white female in no  acute distress.  HEENT:  Negative.  NECK:  Supple without thyromegaly.  NODES:  There is no supraclavicular or inguinal adenopathy.  ABDOMEN:  Soft, nontender.  No mass, organomegaly, ascites, or hernias are  noted.  The lower abdominal incision is well healed.  PELVIC:  EG/BUS, vagina, bladder, urethra are normal.  Cuff is well healed.  No lesions are noted.  She has good depth and caliber of the vagina.  Bimanual and rectovaginal exam reveal no masses, induration, or nodularity.   IMPRESSION:  Stage IB1 squamous cell carcinoma of the cervix status post  radical hysterectomy February 2002.  No evidence of recurrent disease.    PLAN:  Pap smears are obtained today.  The patient will return to see Dr.  Melba Coon in six months and  return to see [place] in one year.  The patient was  given a new prescription for Premarin 1.25 mg daily.                                               De Blanch, M.D.    DC/MEDQ  D:  08/05/2003  T:  08/05/2003  Job:  [id number]   cc:   Almedia Balls. Fore, M.D.  [phone] [address]. [address] [place]  [place] [address]  Fax: [phone]   Telford Nab, R.N.  [phone] N. [address]  [place], [place] [address]

## 2011-04-07 NOTE — Consult Note (Signed)
[facility]  Patient:    [name], [name]                  MRN: [id number] Proc. Date: 04/23/01 Adm. Date:  [id number] Attending:  Jeannette Corpus CC:         Almedia Balls. Randell Patient, M.D.  Telford Nab, R.N.  Jeralyn Bennett, M.D.  Monica Becton, M.D., [facility]   Consultation Report  HISTORY OF PRESENT ILLNESS:  [age]-year-old white female returns for continuing followup, having undergone a radical hysterectomy and pelvic lymphadenectomy, February 2001, for a stage IB1 squamous cell carcinoma of the cervix.  She had 7 mm of invasion with no margins or lymph nodes involved. She has done well since her last visit.  She has had an upper respiratory tract infection and "pneumonia" and when she coughs, she is having minimal stress incontinence; otherwise, her bladder is working well.  She has no other GI or GU symptoms.  She does note she is having hot flushes and takes Premarin 0.625 mg daily.  Overall, she is doing quite well.  REVIEW OF SYSTEMS:  Negative except as noted above.  FAMILY HISTORY AND SOCIAL HISTORY:  Reviewed and unchanged.  Patient comes accompanied by her husband today.  PHYSICAL EXAMINATION:  VITAL SIGNS:  Weight 188 pounds (up 4 pounds from April).  Blood pressure 130/70.  GENERAL:  The patient is a healthy white female in no acute distress.  HEENT:  Negative.  NECK:  Supple without thyromegaly.  NODES:  There is no supraclavicular, axillary or inguinal adenopathy.  ABDOMEN:  Soft and nontender.  No mass, organomegaly, ascites or herniae are noted.  Her incision is well-healed.  PELVIC:  EGBUS is normal.  Vagina is clean, well-supported.  No lesions are noted.  Bimanual and rectovaginal exam reveal no mass, induration or nodularity.  Pap smears are obtained.  IMPRESSION:  Stage IB1 squamous cell carcinoma of the cervix, no evidence of recurrent disease, Pap smears are  pending.  Menopausal symptoms.  We will increase the patients Premarin dose to 0.9 mg. She will report to [place] in a month if she does not have improvement of her symptoms.  She will return to see me in three months and at that point, we may begin alternating visits with Dr. Vear Clock. DD:  04/23/01 TD:  04/23/01 Job: [id number]

## 2011-04-07 NOTE — Discharge Summary (Signed)
[facility]  Patient:    [name], [name]                  MRN: [id number] Adm. Date:  [id number] Disc. Date: [id number] Attending:  Collene Schlichter CC:         Rande Brunt. Clarke-Pearson, M.D.   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  She was admitted on January 08, 2001, for surgery.  The remainder of her History and Physical is as previously dictated.  LABORATORY DATA AND X-RAY FINDINGS:  Preoperative hemoglobin of 14.9, postoperative hemoglobin 13 with 6500 white blood cells initially and 10,300 white blood cells postoperatively.  Chemistries were within normal limits. Chest x-ray was normal.  HOSPITAL COURSE:  The patient was taken to the operating room on January 08, 2001, at which time Dr. De Blanch performed a radical hysterectomy, bilateral salpingo-oophorectomy with pelvic lymphadenectomy. The patient did well postoperatively.  Diet and ambulation were progressed over several days postoperatively.  On January 10, 2001, she was afebrile and experiencing no problems and had voided small amounts, but was left with the suprapubic catheter open.  It was felt that she could be discharged at this time.  DISCHARGE DIAGNOSIS:  Stage IB1 carcinoma of the cervix.  PROCEDURE:  Radical hysterectomy, bilateral salpingo-oophorectomy with bilateral iliac and obturator lymphadenectomy.  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  The patient also had an adenomyosis within the endometrium.  There were four external iliac nodes on the right and two on the left which were negative for tumor.  There was one obturator node on the right and three on the left which were also negative for tumor.  DISPOSITION:  Discharged home.  FOLLOWUP:  Return to the office in approximately five  days for followup.  SPECIAL INSTRUCTIONS:  She was discharged with the suprapubic catheter intact with instructions on how to manage this device.  She will call for any problems.  DISCHARGE MEDICATIONS: 1. Tylox or generic, #30, to be taken one or two q.4h. p.r.n. pain. 2. Doxycycline 100 mg, #14, to be taken one b.i.d. 3. Urecholine 25 mg, #28, or generic to be taken one q.i.d. DD:  01/14/01 TD:  01/15/01 Job: [id number]

## 2011-04-07 NOTE — H&P (Signed)
[facility]  Patient:    [name], [name]                  MRN: [id number] Adm. Date:  [id number] Disc. Date: [id number] Attending:  Jeannette Corpus                         History and Physical  CHIEF COMPLAINT:  Carcinoma of the cervix.  HISTORY:  Patient is a 55 year old gravida 2, para 2, who was initially seen in our office on September 26, 2000 on referral by [place] Dimple [place] to abnormal Pap smear.  The patient underwent evaluation for this and for abnormal bleeding as well as pelvic pain and underwent endocervical curettage on November 23, 2000 which showed squamous cell carcinoma of the cervix, which was poorly differentiated, and laparoscopy which showed multiple adhesions involving the adnexal structures.  There were areas of peritoneal infolding and suggestions of endometriosis throughout the pelvis with enlargement of the uterus.  She is admitted at this time for definitive surgery for her cervical carcinoma, having been seen by Dr. Reuel Boom L. Clarke-Pearson on December 04, 2000.  She was admitted on December 03, 2000 to the hospital by the GI service because of profuse rectal bleeding and found to have colonic ischemia at the splenic flexure but otherwise normal.  CT scan was performed which revealed no evidence of other pelvic disease.  Colonoscopy was done showing the above-noted findings only. Patient has been fully counseled as to the nature of the procedure to be performed and the risks involved to include risks of anesthesia; injury to bowel, bladder, blood vessels, ureters; postoperative hemorrhage or infection; recuperation and possible hormone replacement, should her ovaries be removed. She fully understands all these considerations and wishes to proceed on January 08, 2001.  PAST MEDICAL HISTORY:  History of fibromyalgia, hypercholesterolemia, emotional problems, bone density problems.  She has  frequent headaches, history of some asthma, which has not been a problem, but bronchitis, which has recurred.  MEDICATIONS:  She has been maintained on Prozac 20 mg daily, amitriptyline, Zocor, Fosamax and calcium.  ALLERGIES:  She is allergic to MORPHINE, ERYTHROMYCIN, INFLAMMATORY MEDICATIONS, ASPIRIN and IODINE.  PAST SURGICAL HISTORY:  She has undergone surgery in the past for arthroscopic right knee evaluation in 1994 and a ruptured disk in her lumbosacral area in 1998.  SOCIAL HISTORY:  She smokes four to five cigarettes a day and drinks three to four cups of coffee a day.  FAMILY HISTORY:  Sister with leukemia.  Mother, father, two brothers and a sister with cardiovascular disease.  Uncle with diabetes mellitus.  REVIEW OF SYSTEMS:  HEENT:  Headaches.  CARDIORESPIRATORY:  As noted above only.  GI:  Has trouble with constipation and occasional abdominal pain secondary to GI problems.  GENITOURINARY:  As in present illness. NEUROMUSCULAR:  As noted above only.  PHYSICAL EXAMINATION  VITAL SIGNS:  Height 5 feet 5-1/2 inches.  Weight 187 pounds.  Blood pressure 128/70, pulse 72, respirations 18.  GENERAL:  Well-developed white female in no acute distress.  HEENT:  Within normal limits.  NECK:  Supple without masses, adenopathy or bruits.  HEART:  Regular rate and rhythm without murmurs.  LUNGS:  Clear to P&A.  BREASTS:  Examined sitting and lying, without mass.  Axillae negative.  ABDOMEN:  Flat and soft with no palpable masses, somewhat tender in both lower quadrants.  PELVIC:  External genitalia, Bartholins, urethra  and Skenes glands within normal limits.  Vagina shows no lesions.  Cervix has a reddish lesion within the endocervix and some abnormal friability to the exocervix without definite lesion noted.  Bimanual exam reveals no extension of disease in the parametrial area to palpation.  Adnexa are full but mobile without firmness into the pelvic wall.   Rectovaginal exam is confirmatory.  EXTREMITIES:  Within normal limits.  CENTRAL NERVOUS SYSTEM:  Grossly intact.  SKIN:  Without suspicious lesions.  IMPRESSION:  Carcinoma of the cervix, stage IB clinically.  DISPOSITION:  Admit for the above-noted surgery on January 08, 2001. DD:  01/03/01 TD:  01/04/01 Job: [id number]

## 2011-04-07 NOTE — Assessment & Plan Note (Signed)
HEALTHCARE                               PULMONARY OFFICE NOTE   NAME:[name], [name]                     MRN:          [id number]  DATE:07/04/2006                            DOB:          [birth year]    PROBLEM LIST:  1. Alpha 1 antitrypsin deficiency (homozygous ZZ).  2. Severe chronic obstructive pulmonary disease.  3. History of cervical cancer.   HISTORY:  This woman who is a confirmed homozygous ZZ alpha 1 antitrypsin  deficient patient on chronic replacement, comes now with her husband for  followup.  Her own reference laboratory phenotype is also homozygous ZZ with  an alpha 1 antitrypsin level of 13.959 mg/dl, confirming screening value  done elsewhere.  Her spirometry had shown an FEV1 of 2.77 (56% predicted)  and an FEV1/FVC ratio of 81.99 or 85% of predicted.  We will get more  complete baseline function including lung volumes.  She has quit smoking  very recently using Chantix and we discussed this for encouragement.  She  notices some mild crackling and cough but no chest pain, palpitations, ankle  edema, bloody or purulent sputum.  She has avoided outside activity in the  current air quality conditions.   MEDICATIONS:  1. Spiriva once daily.  2. Nasacort p.r.n.  3. Advair 250/50 b.i.d.  4. Prozac 40 mg  5. Amitriptyline 75 mg at h.s.  6. Crestor 20 mg  7. Prevacid 30 mg  8. Chantix.  9. Rescue albuterol inhaler used occasionally.   ALLERGIES:  DRUG INTOLERANT OF ERYTHROMYCIN, IODINE, MORPHINE AND ANTI-  INFLAMMATORY DRUGS.   Chest CT in January 2006 at [facility] had shown emphysema  changes with no masses.  We talked today again, with husband present, about  the availability and theoretic infection risks associated with providing  replacement therapy on a chronic basis.  She is familiar with the issue from  her sister's experience and wishes  to begin.   OBJECTIVE:  VITAL SIGNS:  Weight 214 pounds, BP  104/70, pulse regular 87,  room air saturation 94%.  There is a mildly congested quality to her  laughter.  Work of breathing is not increased as she sits quietly breathing  on room air.  NECK:  There is no neck vein distention, clubbing, cyanosis or edema.  I  find no adenopathy.  HEART:  Heart sounds are regular.  P2 is not increased.   IMPRESSION:  Chronic obstructive pulmonary disease, Alpha 1 antitrypsin  deficient (ZZ).   PLAN:  1. Confirmed that she has had pneumococcal vaccine within the last few      years.  2. Discussed infection precautions, recommending annual flu shot.  3. Chest x-ray.  4. Schedule complete pulmonary function tests.  5. Walk regularly for endurance.  6. We are arranging to begin proteinase inhibitor replacement therapy      anticipating IV infusion once a week of Zemaira 60 mg/kg per week.      Schedule return here in one year to follow up on replacement therapy.      Should will follow for her primary and  pulmonary management with Dr.      Lowanda Foster and [facility].                                   Clinton D. Maple Hudson, MD, FCCP, FACP   CDY/MedQ  DD:  07/05/2006  DT:  07/05/2006  Job #:  [id number]   cc:   Caryl Comes. Lowanda Foster, MD  Ernestina Penna, MD  Areatha Keas, MD  Almedia Balls Randell Patient, MD

## 2011-04-07 NOTE — Consult Note (Signed)
NAMEKATHYANN, Rose              ACCOUNT NO.:  [id number]   MEDICAL RECORD NO.:  [id number]          PATIENT TYPE:  OUT   LOCATION:  GYN                          FACILITY:  [facility]   PHYSICIAN:  John T. Kyla Balzarine, M.D.    DATE OF BIRTH:  1956/03/18   DATE OF CONSULTATION:  DATE OF DISCHARGE:                                   CONSULTATION   CHIEF COMPLAINT:  This patient returns for ongoing followup of cervical  cancer.   INTERVAL HISTORY/HISTORY OF PRESENT ILLNESS:  Patient underwent a radical  hysterectomy in February, 2002 and received no further therapy for low risk  stage IBI disease.  She denies back pain or pelvic pain, pressure, vaginal  bleeding, or discharge.  She has symptoms of urinary urgency but no  incontinence.  Particularly troublesome is urgency immediately after she has  voided.   PAST MEDICAL HISTORY:  Essentially unchanged from those recorded during  intake evaluation in 2002.   PERSONAL SOCIAL HISTORY:  Essentially unchanged from those recorded during  intake evaluation in 2002.   FAMILY HISTORY:  Essentially unchanged from those recorded during intake  evaluation in 2002.   She is allergic to no known drugs.   REVIEW OF SYSTEMS:  Other than noted above, a comprehensive 10-point review  of systems is negative.   PHYSICAL EXAMINATION:  VITAL SIGNS:  Weight 209 pounds.  GENERAL:  Patient is alert and oriented x3 in no acute distress.  LUNGS:  Lung fields are clear.  BACK:  There is no back or CVA tenderness.  ABDOMEN:  Obese, soft and benign without ascites, mass, or organomegaly.  No  hernia noted.  EXTREMITIES:  Good strength and full range of motion with intact pulses and  no edema.  PELVIC:  External genitalia and BUS are normal.  The vagina is clear without  mucosal lesions and with good supportive bladder and urethra.  Bimanual and  rectovaginal examinations reveal absent uterus and cervix without mass or  nodularity.   ASSESSMENT:  1.  Cervical  carcinoma, no active disease.  2.  Urinary urgency.   PLAN:  These urinary symptoms are worrisome.  I recommended an empiric trial  of Detrol XL 10 mg daily.  She is cautioned regarding side effects.  Patient  will be seeing Dr. Randell Patient for followup in approximately six months.  That  would be her fifth year anniversary.  After that visit, she could be  followed on an annual basis by Dr. Randell Patient.  We would, of course, be glad to  see her at any time on a p.r.n. basis.  Today's cytology will be  communicated to her and to Dr. Randell Patient.      John T. Kyla Balzarine, M.D.  Electronically Signed     JTS/MEDQ  D:  07/25/2005  T:  07/25/2005  Job:  [id number]   cc:   Telford Nab, R.N.  [address]  [place], [place] [address]   Almedia Balls. Fore, M.D.  [phone] [address] [place]  [place] [address]  Fax: [phone]

## 2011-04-07 NOTE — H&P (Signed)
[facility]  Patient:    [name], [name]                     MRN: [id number] Adm. Date:  [id number] Attending:  Estella Husk Dictator:   Mike Gip, P.A.-C. CC:         Almedia Balls. Randell Patient, M.D.  Daniel L. Clarke-Pearson, M.D.   History and Physical  CHIEF COMPLAINT:  Rectal bleeding and abdominal cramping.  HISTORY OF PRESENT ILLNESS:  [name] is a pleasant, 55 year old, white female, a primary patient of Monica Becton, M.D., also known to [employer]. Fore, M.D., with a history of fibromyalgia, asthma, and reflux.  The patient has no prior history of GI problems and no prior abdominal surgery.  She has recently been having difficulty with vaginal bleeding and underwent laparoscopy and hysteroscopy, as well as D&C, approximately one week ago with Almedia Balls. Randell Patient, M.D.  She has been told of a diagnosis of endometriosis and also possibly endometrial and cervical cancer.  The patient is unsure of the exact diagnosis.  She is scheduled to see Reuel Boom L. Clarke-Pearson, M.D., on Tuesday, December 04, 2000, for further evaluation.  She has not noted any recent in her bowel habits, abdominal pain, etc.  She has been losing weight, but had been attempting to lose weight.  Now with onset on Friday, November 30, 2000, p.m. with an episode of abdominal cramping and diaphoresis followed by diarrhea.  She did not notice any blood at that time and went back to bed. About 4:30 a.m., she was awakened with abdominal cramping followed by a burgundy stool and then had several more episodes over the next few hours. This then resolved.  These episodes were associated with sweats and lightheadedness.  Last evening, her symptoms recurred and she had three further episodes of burgundy stool and came to the emergency room.  She was evaluated by the ER physician, hemodynamically stable.  The rectal exam per Smitty Cords. Cheek, M.D., showed a large amount of burgundy  heme.  Labs showed a hemoglobin of 14.7, a hematocrit of 41.4, a WBC of 12.4, and platelets of 314. The urinalysis was positive for ketones, bacteria, and 6-10 wbcs.  The culture is pending.  GI was called and she is now admitted to the hospital for further diagnostic evaluation.  CURRENT MEDICATIONS: 1. Amitriptyline 75 mg q.h.s. 2. Prozac 20 mg q.d. 3. Pepcid 40 mg q.a.m. 4. Albuterol inhaler two puffs q.i.d. 5. Darvocet-N 100 one every six hours as needed.  ALLERGIES:  IODINE which causes hives and swelling, MORPHINE itching and swelling, and ASPIRIN and NSAIDs aggravate her asthma symptoms.  PAST MEDICAL HISTORY:  As above.  FAMILY HISTORY:  Mother with pacemaker.  Brother, age 23, status post recent CABG.  The family history is pertinent for COPD and asthma.  No family history of colon cancer, polyps, or inflammatory bowel disease.  SOCIAL HISTORY:  The patient is married.  She is disabled secondary to her fibromyalgia.  She has two children, ages 58 and 55.  No tobacco and no ETOH.  REVIEW OF SYSTEMS:  Cardiovascular:  Negative for chest pain or anginal symptoms.  Pulmonary:  Intermittent for asthma symptoms.  Genitourinary: Denies any dysuria, urgency, or frequency currently.  GYN:  As above. Musculoskeletal:  Patient with fibromyalgia fairly well controlled at the moment.  PHYSICAL EXAMINATION:  A well-developed white female in no acute distress.  VITAL SIGNS:  The temperature was 97.4 degrees, blood pressure  105/60, and pulse in the 70s.  WEIGHT:  178 pounds.  HEENT:  Normocephalic and atraumatic.  EOMI.  PERRLA.  Sclerae anicteric.  NECK:  Supple without nodes.  No JVD or bruit.  CARDIOVASCULAR:  Regular rate and rhythm with S1 and S2.  No murmurs, rubs, or gallops.  PULMONARY:  Scattered wheezes.  ABDOMEN:  Soft and nontender.  There is no mass or hepatosplenomegaly.  Bowel sounds are active.  RECTAL:  Exam is not repeated at this time.  Burgundy heme per  the ER physician.  PELVIC:  Exam not done.  Recent work-up per Almedia Balls. Fore, M.D., as outlined above.  EXTREMITIES:  No clubbing, cyanosis, or edema.  Pulses are intact.  IMPRESSION: 1. A 55 year old white female with acute lower gastrointestinal bleed.  Rule    out rectal involvement with gynecologic cancer.  Rule out ischemic colitis.    Rule out occult colonic lesion.  Rule out diverticular source. 2. New diagnosis of endometrial versus cervical cancer. 3. Asthma. 4. Gastroesophageal reflux disease. 5. Fibromyalgia.  PLAN:  The patient is admitted to the service of Wilhemina Bonito. Eda Keys., M.D., for IV fluid hydration, serial H&Hs, and transfusions as necessary.  She will be kept on a liquid diet.  Will check CT scan of the abdomen and pelvis and possibly colonoscopy depending on CT results.  Will notify.  Will also notify Almedia Balls. Randell Patient, M.D., and Rande Brunt. Clarke-Pearson, M.D., of her admission. For details, please see the orders. DD:  12/02/00 TD:  12/02/00 Job: [id number]

## 2011-04-07 NOTE — Assessment & Plan Note (Signed)
[facility]                           GASTROENTEROLOGY OFFICE NOTE   KHALIYA, GOLINSKI                     MRN:          [id number]  DATE:07/11/2006                            DOB:          07-Aug-1956    HISTORY:  Ms. Natt presents today for followup.  She was evaluated on May 22, 2006 for dysphagia and diarrhea.  See that dictation for details.  She  was suspected as having irritable bowel syndrome as well as reflux disease  with possible peptic stricture.   She subsequently underwent colonoscopy and upper endoscopy on May 30, 2006.  A complete colonoscopy was normal except for the presence of a diminutive  sigmoid colon polyp, which was removed and found to be hyperplastic.  Followup in 10 years recommended.  Levbid prescribed for irritable bowel.   Next, upper endoscopy revealed of esophagitis as well as chronic gastritis.  Biopsies of the gastric mucosa confirmed gastritis.  No Helicobacter pylori.  She was treated with Prevacid 30 mg daily.   She returns for followup today, as requested.  Patient is with her husband.  She is happy to report that she is feeling much better.  No complaints of  dysphagia or reflux symptoms.  As well, her abdominal cramping and loose  stools are much improved on Levbid.  No appreciable medication side effects.  She is also happy to report that she is taking an agent to help her quit  smoking.  She has not smoked for one week, which is the longest she has gone  since she started smoking many years ago.   PHYSICAL EXAMINATION:  GENERAL:  A well-appearing female in no acute  distress.  VITAL SIGNS:  Blood pressure 138/76, heart rate 88 and regular.  Weight is  212.6 pounds.  ABDOMEN:  Soft without tenderness, mass, or hernia.  Good bowel sounds  heard.   IMPRESSION:  1. Diarrhea, predominant irritable bowel syndrome, improved on Levbid.      Negative colonoscopy, as discussed.  2. Gastroesophageal  reflux disease and chronic gastritis.  Symptoms      improved on Prevacid.   RECOMMENDATIONS:  1. Continue Prevacid 30 mg daily.  2. Reflux precautions with attention to weight loss and discontinuance of      smoking.  3. Continue Levbid p.r.n.  4. Return to the general medical care of Dr. Rudi Heap.  5. GI followup p.r.n.                                   Wilhemina Bonito. Eda Keys., MD   JNP/MedQ  DD:  07/11/2006  DT:  07/11/2006  Job #:  [id number]   cc:   Ernestina Penna, MD  Almedia Balls. Randell Patient, MD

## 2011-04-07 NOTE — Consult Note (Signed)
NAME:  [name], [name]                     ACCOUNT NO.:  [id number]   MEDICAL RECORD NO.:  [id number]                   PATIENT TYPE:  OUT   LOCATION:  GYN                                  FACILITY:  [facility]   PHYSICIAN:  Daniel L. Clarke-Pearson, M.D.      DATE OF BIRTH:  [birth year]   DATE OF CONSULTATION:  07/29/2002  DATE OF DISCHARGE:                                   CONSULTATION   HISTORY OF PRESENT ILLNESS:  The patient is a 55 year old white female who  returns for continuing follow-up having undergone a radicle hysterectomy and  pelvic lymphadenectomy in February of 2001 with a stage IBI squamous cell  carcinoma of the cervix.  She had good prognostic features and did not  receive any additional adjuvant radiation therapy.   Since her last visit with me a year ago, she has seen Almedia Balls. Fore, M.D.,  approximately six months ago.   From a gynecologic point of view, the patient denies any pelvic pain or  pressure, GI or GU symptoms.  She has no vaginal bleeding.  She does note  that she is having hot flashes especially at night. She currently is taking  Premarin 0.9 mg daily.   REVIEW OF SYSTEMS:  This is essentially negative except as noted above. The  patient has fibromyalgia.   FAMILY HISTORY:  Reviewed and unchanged.   SOCIAL HISTORY:  Reviewed and unchanged.  She comes accompanied by her  husband today.   PHYSICAL EXAMINATION:  GENERAL APPEARANCE:  The patient is a healthy white  female in no acute distress.  VITAL SIGNS:  Weight 184 pounds, blood pressure 130/86.  HEENT:  Negative.  NECK:  Supple without thyromegaly.  There is no supraclavicular or inguinal  adenopathy.  ABDOMEN:  Soft and nontender with no masses, organomegaly, ascites or  hernias noted.  Transverse incision has healed nicely.  PELVIC:  EGBUS, vagina, bladder and urethra are normal.  The vaginal cuff is  well healed.  No lesions are noted.  Bimanual and rectovaginal examination  revealed  no masses,  induration or nodularity.   IMPRESSION:  1. Stage IB1 squamous cell carcinoma of the cervix, status post radical     hysterectomy February 2001.  No evidence of recurrent disease.  2.     Hot flashes are not being adequately controlled using Premarin 0.9 mg.  We     will increase her dose to 1.25 mg.  Potassiums are obtained today.   PLAN:  She will return to see Dr. Randell Patient in six months and return to see [place] in  one year.                                                 Daniel L. Clarke-Pearson, M.D.    DLC/MEDQ  D:  07/29/2002  T:  07/29/2002  Job:  [id number]   cc:   Almedia Balls. Fore, M.D.  [phone] [address] [place]  [place] [address]  Fax: [phone]   Telford Nab, R.N.

## 2011-04-07 NOTE — Discharge Summary (Signed)
[facility]  Patient:    [name], [name]                  MRN: [id number] Adm. Date:  [id number] Disc. Date: [id number] Attending:  Collene Schlichter                           Discharge Summary  ADDENDUM:  DISPOSITION:  She was fully ambulatory, on a regular diet and in good condition at the time of discharge. DD:  01/14/01 TD:  01/15/01 Job: [id number]

## 2011-04-07 NOTE — Consult Note (Signed)
[facility]  Patient:    [name], [name]                     MRN: [id number] Proc. Date: 12/04/00 Adm. Date:  [id number] Attending:  Estella Husk CC:         Almedia Balls. Randell Patient, M.D.             Telford Nab, R.N.             Wilhemina Bonito. Eda Keys., M.D. [facility]                          Consultation Report  HISTORY:  [age]-old white married female seen in consultation regarding management of a newly diagnosed cervical carcinoma.  The patient has had at least one year of irregular bleeding.  She has been seen recently by Dr. Almedia Balls. Fore, who found that the patient had pelvic pain, ovarian cysts and a stenotic cervix.  She underwent hysteroscopy, endocervical curettage and laparoscopy on January 4th.  On the endocervical curettage, she was found to have a poorly differentiated squamous cell carcinoma.  At the time of laparoscopy, she was found to have pelvic adhesions, ovarian cysts and possible endometriosis.  The patient has had an uncomplicated postoperative course.  She was admitted to [facility] with lower GI bleeding and has recently undergone colonoscopy showing an ischemic colitis.  From a gynecologic point of view, the patient claims she has had recent Pap smears which were essentially normal.  She does have a remote history of having cryotherapy as a young adult.  She denies any fever or chills or any other GU symptoms.  PAST MEDICAL HISTORY Medial illnesses:  Fibromyalgia and asthma.  SURGICAL HISTORY:  None.  DRUG ALLERGIES:  IODINE, MORPHINE, ASPIRIN and NSAIDS.  CURRENT MEDICATIONS:  Amitriptyline, Prozac, Pepcid, albuterol inhaler p.r.n., Darvocet-N 100.  FAMILY HISTORY:  Negative for gynecologic, breast or colon cancer.  SOCIAL HISTORY:  The patient is married.  She is disabled secondary to fibromyalgia.  She has two children.  She does not smoke or drink.  PHYSICAL EXAMINATION  VITAL SIGNS:  Height 5  feet 5 inches.  Weight 178 pounds.  Blood pressure 110/70, pulse 80, respiratory rate 18.  GENERAL:  The patient is a healthy white female in no acute distress.  HEENT:  Negative.  NECK:  Supple without thyromegaly.  LYMPHATICS:  There is no supraclavicular or inguinal adenopathy.  ABDOMEN:  Soft and nontender.  Her laparoscopy incisions are healing well.  No masses, organomegaly, ascites or herniae are noted.  PELVIC:  EG/BUS, vagina, urethra and bladder are normal.  The cervix has an endophytic lesion measuring approximately 2 to 3 cm in diameter with friable tissue in the central portion of the lesion.  On bimanual examination, the cervix is tender.  I do not feel any parametrial or vaginal involvement. Rectovaginal exam confirms.  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  RECOMMENDATION:  Patient has had a CAT scan earlier this morning and I do not have these results, which need to be checked to be certain there is no evidence of distant metastases.  Provided the lesion appears to be confined to the cervix, the patient might be treated either with radical hysterectomy and pelvic lymphadenectomy or with radiation therapy.  I discussed with the patient and her husband both of these approaches and the expected side-effects.  As  it turns out, I am not on this patients insurance plan and therefore cannot provide further care.  Further consultation and management might be coordinated through Dr. Pershing Cox here in [place], or upon referral to an institution signed on on this patients managed care plan. DD:  12/04/00 TD:  12/04/00 Job: [id number]

## 2011-04-07 NOTE — Consult Note (Signed)
[facility]  Patient:    VERNELL, BACK Visit Number: [id number] MRN: [id number]          Service Type: GON Location: GYN Attending Physician:  Jeannette Corpus Dictated by:   Rande Brunt. Clarke-Pearson, M.D. Proc. Date: 08/14/01 Admit Date:  08/14/2001   CC:         Almedia Balls. Randell Patient, M.D.  Jeralyn Bennett, M.D.  Gwynneth Albright, M.D., [facility]  Telford Nab, R.N.   Consultation Report  FOLLOWUP EVALUATION:  [age]-year-old white female returns for continuing followup of a cervical cancer.  She underwent a radical hysterectomy and pelvic lymphadenopathy in February of 2001 for a stage IB1 squamous cell carcinoma of the cervix.  She had negative margins, negative nodes and received no adjuvant therapy.  INTERVAL HISTORY:  Since her last visit, the patient reports she has done well.  She does have some urinary hesitancy, she has no incontinence and has had no bladder infections or pyelonephritis.  She has no other GI or GU symptoms and is fully active.  REVIEW OF SYSTEMS:  Negative.  FAMILY HISTORY AND SOCIAL HISTORY:  Reviewed and unchanged from previous notation.  Her husband comes with her today.  PHYSICAL EXAMINATION:  VITAL SIGNS:  Weight 188-1/2 pounds (stable).  Blood pressure 128/78.  GENERAL:  The patient is a healthy white female in no acute distress.  HEENT:  Negative.  NECK:  Supple without thyromegaly.  NODES:  There is no supraclavicular or inguinal adenopathy.  ABDOMEN:  Soft and nontender.  No masses, organomegaly, ascites or herniae are noted.  Her transverse incision is well-healed.  PELVIC:  EGBUS normal.  Vagina is clean, well-supported.  No lesions are noted.  Bimanual and rectovaginal exam reveal no masses, induration or nodularity.  IMPRESSION:  Stage IB1 squamous cell carcinoma of the cervix, status post radical hysterectomy, February of 2001.  No evidence of recurrent  disease.  PLAN:  Pap smear are obtained.  We will have the patient return to see Dr. Almedia Balls. Fore in six months and have her return to see me in one year and we will alternate visits therefore.Dictated by:   Rande Brunt. Clarke-Pearson, M.D. Attending Physician:  Jeannette Corpus DD:  08/14/01 TD:  08/14/01 Job: [phone] [id number]

## 2011-04-07 NOTE — Assessment & Plan Note (Signed)
[facility]                             PULMONARY OFFICE NOTE   NAME:DUNLAPLisel, Siegrist                     MRN:          [id number]  DATE:12/03/2006                            DOB:          [birth year]    PROBLEMS:  1. Alpha-1 antitrypsin deficiency (homozygous ZZ).  2. Severe chronic obstructive pulmonary disease.  3. Tobacco abuse.  4. History of cervical cancer.  5. Irritable bowel.  6. Esophageal reflux and chronic gastritis (Dr. Marina Goodell).  7. Fibromyalgia.   HISTORY OF PRESENT ILLNESS:  She was having trouble with headache and  chills from Zemaira, but now pretreating with Benadryl and Tylenol.  This is no longer much of an issue, and she is continuing her infusions  once a week.  She could not afford the Chantix which she says was  helping, and hopes I can get her an override to cover it.  I compared  the cost of a couple of months of Chantix with long term cost of ongoing  cigarette smoking.  Unfortunately, she is smoking more.  She will get  short of breath walking across the parking lot or carrying groceries,  but she tries to walk daily.  She says Dr.  Christell Constant did a chest x-ray two  months ago with no acute findings.   MEDICATIONS:  1. Spiriva.  2. Nasacort.  3. Advair 250/50 .  4. Prozac 40 mg.  5. Amitriptyline 75 mg.  6. Aciphex 20 mg.  7. Crestor 20 mg.  8. Prevacid 30 mg.  9. Chantix .  10.Zemaira 60 mg/kg.  11.Albuterol inhaler.  12.Hydrocodone.   ALLERGIES:  INTOLERANCES TO ERYTHROMYCIN, IODINE, MORPHINE, ANTI-  INFLAMMATORY DRUGS.   OBJECTIVE:  VITAL SIGNS:  Weight 212 pounds, blood pressure 122/64,  pulse regular at 80.  Room air saturation 92%.  There are a few crackles  at the bases.  Breath sounds are quite diminished, but she is not  labored or in distress.  HEART:  Sounds are regular without murmur or gallops.  I do not find  adenopathy, cyanosis, clubbing or edema.   IMPRESSION:  1. Severe COPD.  2. Alpha-1  antitrypsin deficiency.  3. Ongoing tobacco use.   PLAN:  1. Smoking cessation counseling was emphasized with options and      techniques reviewed.  We are scheduling a pulmonary function test.      I will try calling her home pharmacy to see if we can get an      override request for the Chantix.  2. Schedule return in six months, earlier p.r.n.     Clinton D. Maple Hudson, MD, Tonny Bollman, FACP  Electronically Signed    CDY/MedQ  DD: 12/03/2006  DT: 12/04/2006  Job #: [id number]   cc:   Ernestina Penna, M.D.  Areatha Keas, M.D.

## 2011-04-07 NOTE — Consult Note (Signed)
[facility]  Patient:    [name], [name]                  MRN: [id number] Adm. Date:  [id number] Attending:  Jeannette Corpus CC:         Almedia Balls. Randell Patient, M.D.  Telford Nab, R.N.  Jeralyn Bennett, M.D.   Consultation Report  A 55 year old white female returns now two weeks postoperatively from a radical hysterectomy and pelvic lymphadenectomy.  She has been doing a clamp and release regimen with her suprapubic catheter and is now voiding nice volumes with minimal residuals.  She has no urinary tract symptoms.  We also had the patient come today to discuss her pathology report and treatment plan.  PROCEDURE NOTE:  The suprapubic catheter was removed without difficulty. Dressing was applied.  IMPRESSION:  Stage 1B1 squamous cell carcinoma of the cervix.  Final pathology showed invasion to 7 mm and a rare focus of vascular space invasion.  The perimetria, vaginal margins, and all lymph nodes were free of any metastatic disease.  Given the favorable pathology report I do not believe any additional therapy would be of benefit to the patient.  We will arrange for her to have followup visits.  She is encouraged to void by the clock for the next week and to get up once at night to void so that she does not get over extension of her bladder.  She will return for six week postoperative checkup. DD:  01/23/01 TD:  01/23/01 Job: [id number]

## 2011-05-19 ENCOUNTER — Encounter: Payer: Self-pay | Admitting: Internal Medicine

## 2011-05-22 ENCOUNTER — Encounter: Payer: Self-pay | Admitting: Internal Medicine

## 2011-05-22 ENCOUNTER — Ambulatory Visit (INDEPENDENT_AMBULATORY_CARE_PROVIDER_SITE_OTHER): Payer: Medicare Other | Admitting: Internal Medicine

## 2011-05-22 VITALS — BP 130/72 | HR 86 | Ht 65.0 in | Wt 210.0 lb

## 2011-05-22 DIAGNOSIS — F17200 Nicotine dependence, unspecified, uncomplicated: Secondary | ICD-10-CM

## 2011-05-22 DIAGNOSIS — J449 Chronic obstructive pulmonary disease, unspecified: Secondary | ICD-10-CM

## 2011-05-22 DIAGNOSIS — E8809 Other disorders of plasma-protein metabolism, not elsewhere classified: Secondary | ICD-10-CM

## 2011-05-22 DIAGNOSIS — J4489 Other specified chronic obstructive pulmonary disease: Secondary | ICD-10-CM

## 2011-05-22 MED ORDER — BUDESONIDE-FORMOTEROL FUMARATE 80-4.5 MCG/ACT IN AERO: 2.0000 | INHALATION_SPRAY | Freq: Two times a day (BID) | RESPIRATORY_TRACT | Status: DC

## 2011-05-22 MED ORDER — FUROSEMIDE 20 MG PO TABS: ORAL_TABLET | ORAL | Status: DC

## 2011-05-22 NOTE — Assessment & Plan Note (Signed)
We explained the extreme importance of this. She is going to try patches.

## 2011-05-22 NOTE — Assessment & Plan Note (Addendum)
Severe COPD with ongoing bronchitis. Advair caused edema. We will try sample Symbicort. I discussed her mild dependent edema, probably from cor pulmonale, and we will give her some lasix for prn use. As long as she doesn't use more than a couple of days/ week, she shouldn't need supplemental potassium.

## 2011-05-22 NOTE — Patient Instructions (Signed)
Script for lasix/ furosemide 20 mg- diuretic. Use no more than a couple of times/ week to control swelling from water retention.   Sample and script Symbicort 2 puffs and rinse mouth, twice every day  Try nicotine patches- start with 21 mg, once daily. Every 2-3 weeks, try reducing- to 14 mg, then to 7 mg, then eventually stop. They come with directions.

## 2011-05-22 NOTE — Progress Notes (Signed)
  Subjective:    Patient ID: [name], female    DOB: 10-25-1956, 55 y.o.   MRN: [id number]  HPI 05/22/11- 55 yoF  smoker on replacement for a1AT deficiency, with severe COPD, complicated by OSA and fibromyalgia. Last here November 22, 2010. Still smoking 1/3 ppd. We discussed her mood changes with Chantix. She has never tried patches.  Coughing more in hot weather, around strong smells. Short of breath may be a little worse over time, especially if carrying something. Denies chest pain or palpitation. Feet and legs swell some and she notes nocturia x 2-3.   Review of Systems Constitutional:   No weight loss, night sweats,  Fevers, chills, fatigue, lassitude. HEENT:   No headaches,  Difficulty swallowing,  Tooth/dental problems,  Sore throat,                No sneezing, itching, ear ache, nasal congestion, post nasal drip,   CV:  No chest pain,  Orthopnea, PND, , anasarca, dizziness, palpitations  GI  No heartburn, indigestion, abdominal pain, nausea, vomiting, diarrhea, change in bowel habits, loss of appetite  Resp:No excess mucus,   No coughing up of blood.  No change in color of mucus.    Skin: no rash or lesions.  GU: no dysuria, change in color of urine, no urgency or frequency.  No flank pain.  MS:  No joint pain or swelling.  No decreased range of motion.  No back pain.  Psych:  No change in mood or affect. No depression or anxiety.  No memory loss.      Objective:   Physical Exam General- Alert, Oriented, Affect-appropriate, Distress- none acute  Skin- rash-none, lesions- none, excoriation- none  Lymphadenopathy- none  Head- atraumatic  Eyes- Gross vision intact, PERRLA, conjunctivae clear secretions  Ears- Hearing, canals, Tm- normal   Nose- Clear, No- Septal dev, mucus, polyps, erosion, perforation   Throat- Mallampati II , mucosa clear , drainage- none, tonsils- atrophic  Neck- flexible , trachea midline, no stridor , thyroid nl, carotid no bruit  Chest  - symmetrical excursion , unlabored     Heart/CV- RRR , no murmur , no gallop  , no rub, nl s1 s2                     - JVD- none , edema- none now , stasis changes- none, varices- none     Lung- Rhonchit right base, raspy / wheezy cough , dullness-none, rub- none     Chest wall-  Abd- tender-no, distended-no, bowel sounds-present, HSM- no  Br/ Gen/ Rectal- Not done, not indicated  Extrem- cyanosis- none, clubbing, none, atrophy- none, strength- nl  Neuro- grossly intact to observation         Assessment & Plan:

## 2011-05-22 NOTE — Assessment & Plan Note (Signed)
Continues Zemaira delivered weekly IV by [facility]

## 2011-05-27 ENCOUNTER — Encounter: Payer: Self-pay | Admitting: Internal Medicine

## 2011-06-08 ENCOUNTER — Telehealth: Payer: Self-pay | Admitting: Internal Medicine

## 2011-06-08 MED ORDER — DOXYCYCLINE HYCLATE 100 MG PO TABS: ORAL_TABLET | ORAL | Status: DC

## 2011-06-08 NOTE — Telephone Encounter (Signed)
Per CY-more likely she is getting sick offer her Doxycycline 100 mg #8 take 2 today then 1 daily no refills.

## 2011-06-08 NOTE — Telephone Encounter (Signed)
Pt returning call to triage [place]

## 2011-06-08 NOTE — Telephone Encounter (Signed)
Spoke with pt. She was last seen on 05/22/11 and started on symbicort. She is now c/o chest tightness, prod cough with minimal light yellow sputum, and increased congestion in her chest. She states that she is wondering if this is coming from symbicort, or if she is just getting sick. She denies any fever, chills, sweats, wheeze or change in her breathing. Please advise, thanks! Allergies  Allergen Reactions  . Aspirin   . Erythromycin   . Iodine   . Morphine

## 2011-06-08 NOTE — Telephone Encounter (Signed)
LMOMTCB

## 2011-06-08 NOTE — Telephone Encounter (Signed)
Spoke with pt and notified of recs per CDY. She verbalized understanding, and rx for doxy called to pharm.

## 2011-11-01 ENCOUNTER — Telehealth: Payer: Self-pay | Admitting: Internal Medicine

## 2011-11-01 DIAGNOSIS — R05 Cough: Secondary | ICD-10-CM

## 2011-11-01 DIAGNOSIS — R509 Fever, unspecified: Secondary | ICD-10-CM

## 2011-11-01 MED ORDER — LEVOFLOXACIN 750 MG PO TABS: 750.0000 mg | ORAL_TABLET | Freq: Every day | ORAL | Status: AC

## 2011-11-01 NOTE — Telephone Encounter (Signed)
Spoke with pt. She states has had fever x 2 days ranging from 100-102- states that she is also having dry cough. No other complaints- denied any wheeze or SOB. She states has only tried taking mucinex and benadryl for the cough with no relief. Please advise, thanks! Allergies  Allergen Reactions  . Aspirin   . Erythromycin   . Iodine   . Morphine

## 2011-11-01 NOTE — Telephone Encounter (Signed)
Prescription called to pharmacy. Pt aware. Pt verbalized understanding. Appointment made with CDY 11-02-11 at 10:30 pt to go for CXR prior order placed.

## 2011-11-01 NOTE — Telephone Encounter (Signed)
CY is out of the office this afternoon so will send to [facility] as doc of day.

## 2011-11-01 NOTE — Telephone Encounter (Signed)
Ok to call in levaquin 750mg  one a day for 5 days so that she can get started, but MUST be seen tomorrow in the office by someone given her severe lung disease and fever.  Will need cxr prior to visit with provider tomorrow.

## 2011-11-02 ENCOUNTER — Ambulatory Visit (INDEPENDENT_AMBULATORY_CARE_PROVIDER_SITE_OTHER)
Admission: RE | Admit: 2011-11-02 | Discharge: 2011-11-02 | Disposition: A | Discharge status: Home / Self Care | Payer: Medicare Other | Source: Ambulatory Visit | Attending: Internal Medicine | Admitting: Internal Medicine

## 2011-11-02 ENCOUNTER — Encounter: Payer: Self-pay | Admitting: Internal Medicine

## 2011-11-02 ENCOUNTER — Ambulatory Visit (INDEPENDENT_AMBULATORY_CARE_PROVIDER_SITE_OTHER): Payer: Medicare Other | Admitting: Internal Medicine

## 2011-11-02 VITALS — BP 110/70 | HR 103 | Temp 97.5°F | Ht 65.0 in | Wt 208.4 lb

## 2011-11-02 DIAGNOSIS — J449 Chronic obstructive pulmonary disease, unspecified: Secondary | ICD-10-CM

## 2011-11-02 DIAGNOSIS — R509 Fever, unspecified: Secondary | ICD-10-CM

## 2011-11-02 DIAGNOSIS — R05 Cough: Secondary | ICD-10-CM

## 2011-11-02 MED ORDER — METHYLPREDNISOLONE ACETATE 80 MG/ML IJ SUSP
80.0000 mg | Freq: Once | INTRAMUSCULAR | Status: AC
  Administered 2011-11-02: 80 mg via INTRAMUSCULAR

## 2011-11-02 MED ORDER — LEVALBUTEROL HCL 0.63 MG/3ML IN NEBU
0.6300 mg | INHALATION_SOLUTION | Freq: Once | RESPIRATORY_TRACT | Status: AC
  Administered 2011-11-02: 0.63 mg via RESPIRATORY_TRACT

## 2011-11-02 MED ORDER — LEVOFLOXACIN 500 MG PO TABS: 500.0000 mg | ORAL_TABLET | Freq: Every day | ORAL | Status: AC

## 2011-11-02 NOTE — Progress Notes (Signed)
Patient ID: [name], female    DOB: Oct 06, 1956, 55 y.o.   MRN: [id number]  HPI 05/22/11- 6 yoF  smoker on replacement for a1AT deficiency, with severe COPD, complicated by OSA and fibromyalgia. Last here November 22, 2010. Still smoking 1/3 ppd. We discussed her mood changes with Chantix. She has never tried patches.  Coughing more in hot weather, around strong smells. Short of breath may be a little worse over time, especially if carrying something. Denies chest pain or palpitation. Feet and legs swell some and she notes nocturia x 2-3.   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 Husband is here. She has had flu and pneumonia vaccines. Acute visit-new respiratory infection started about 2 days ago with dry cough, fever to 100 and 101, no chills, frontal headache, nasal congestion. She denies rash, nodes, GI upset or myalgias/arthralgias. She had called our on-call physician yesterday is going to sent Levaquin but her drugstore was already closed. A couple of grandchildren have been recently ill with viral syndromes.    Review of Systems- see HPI Constitutional:   No-   weight loss, night sweats,     +fevers, , fatigue, lassitude. HEENT:   + headaches, No- difficulty swallowing, tooth/dental problems, sore throat,       No-  sneezing, itching, ear ache,  +nasal congestion, post nasal drip,  CV:  No-   chest pain, orthopnea, PND, swelling in lower extremities, anasarca,                                  dizziness, palpitations Resp: +  shortness of breath with exertion or at rest.              +  productive cough,  + non-productive cough,  No- coughing up of blood.              No-   change in color of mucus.  No- wheezing.   Skin: No-   rash or lesions. GI:  No-   heartburn, indigestion, abdominal pain, nausea, vomiting, diarrhea,                 change in bowel habits, loss of appetite GU: MS:   Neuro-        Psych:         Objective:   Physical Exam General- Alert, Oriented, Affect-appropriate, Distress- none acute Skin- rash-none, lesions- none, excoriation- none Lymphadenopathy- none Head- atraumatic            Eyes- Gross vision intact, PERRLA, conjunctivae clear secretions            Ears- Hearing, canals-normal            Nose- congested, no-Septal dev, mucus, polyps, erosion, perforation             Throat- Mallampati II , mucosa clear , drainage- none, tonsils- atrophic, tongue coated Neck- flexible , trachea midline, no stridor , thyroid nl, carotid no bruit Chest - symmetrical excursion , unlabored           Heart/CV- RRR , no murmur , no gallop  , no rub, nl s1 s2                           - JVD- none , edema- none, stasis changes-  none, varices- none           Lung- rough bronchitic cough , dullness-none, rub- none           Chest wall-  Abd- tender-no, distended-no, bowel sounds-present, HSM- no Br/ Gen/ Rectal- Not done, not indicated Extrem- cyanosis- none, clubbing, none, atrophy- none, strength- nl Neuro- grossly intact to observation   or at all

## 2011-11-02 NOTE — Progress Notes (Signed)
Addended by: Ronny Bacon on: 11/02/2011 04:53 PM   Modules accepted: Orders

## 2011-11-02 NOTE — Assessment & Plan Note (Signed)
Acute URI/bronchitis syndrome consistent with the widespread respiratory illness in the community now. This may be influenza. Biggest concern with her underlying COPD, is her potential for progression with sinus infection or purulent bronchitis/pneumonia. We discussed supportive care measures, fluids and Mucinex etc. Plan-nebulizer treatment today, Depo-Medrol, Levaquin.

## 2011-11-02 NOTE — Patient Instructions (Signed)
Script sent for levaquin generic  Try mucinex and plenty of fluids to thin the mucus  Neb xop 0.63  Depo 80   Comfort meds for cough and colds are ok

## 2011-11-08 ENCOUNTER — Telehealth: Payer: Self-pay | Admitting: Internal Medicine

## 2011-11-08 NOTE — Telephone Encounter (Signed)
I spoke with patient; she states she was told to stop Levaquin due to reaction and is wanting another Rx. Pt aware that CY is out of office until Thursday am. Will address then.

## 2011-11-08 NOTE — Telephone Encounter (Signed)
Need to know what kind of reaction she thinks she had to Levaquin, what are her symptoms now, and what are her med allergies.

## 2011-11-09 NOTE — Telephone Encounter (Signed)
I called to speak with patient-she was still sleeping at this time-man that answered phone will have patient return our call.

## 2011-11-10 MED ORDER — DOXYCYCLINE HYCLATE 100 MG PO TABS: ORAL_TABLET | ORAL | Status: AC

## 2011-11-10 NOTE — Telephone Encounter (Signed)
Offer doxycycline 100 mg, # 8, 2 today then one daily 

## 2011-11-10 NOTE — Telephone Encounter (Signed)
Pt says Levaquin caused severe stomach cramping and blood in her stools. These symptoms have stopped.  She is still congested and coughing up yellow mucus. Also fatigued.  Allergies reviewed with the patient. NSAIDS added to the list.  Please advise. Allergies  Allergen Reactions  . Aspirin   . Erythromycin   . Iodine   . Morphine   . Nsaids     Swelling, GI upset

## 2011-11-10 NOTE — Telephone Encounter (Signed)
Left message with female that answered the phone and he stated he would let pt know new antibiotic was sent to the pharmacy

## 2011-11-16 ENCOUNTER — Telehealth: Payer: Self-pay | Admitting: Internal Medicine

## 2011-11-16 NOTE — Telephone Encounter (Signed)
Called to initiate PA for the zemaira.  Will fax over forms to be filled out and faxed back.

## 2011-11-16 NOTE — Telephone Encounter (Signed)
lmomtcb for alisa to discuss pts meds.

## 2011-11-23 ENCOUNTER — Ambulatory Visit: Payer: Medicare Other | Admitting: Internal Medicine

## 2011-11-24 NOTE — Telephone Encounter (Signed)
Papers have been completed per Katie---will forward message to [facility] to hold and wait for approval.

## 2011-11-24 NOTE — Telephone Encounter (Signed)
Pt is aware that we have faxed back the paperwork to insurance company and will await a decision. Papers in Triage holding area.

## 2011-11-27 NOTE — Telephone Encounter (Signed)
PA Approved through 11-23-2012. Christoper Allegra will be informed by [employer] as well.

## 2012-05-02 ENCOUNTER — Ambulatory Visit (INDEPENDENT_AMBULATORY_CARE_PROVIDER_SITE_OTHER): Payer: Medicare Other | Admitting: Internal Medicine

## 2012-05-02 ENCOUNTER — Encounter: Payer: Self-pay | Admitting: Internal Medicine

## 2012-05-02 VITALS — BP 122/74 | HR 89 | Ht 65.0 in | Wt 208.0 lb

## 2012-05-02 DIAGNOSIS — E8809 Other disorders of plasma-protein metabolism, not elsewhere classified: Secondary | ICD-10-CM

## 2012-05-02 DIAGNOSIS — J4489 Other specified chronic obstructive pulmonary disease: Secondary | ICD-10-CM

## 2012-05-02 DIAGNOSIS — J449 Chronic obstructive pulmonary disease, unspecified: Secondary | ICD-10-CM

## 2012-05-02 DIAGNOSIS — J310 Chronic rhinitis: Secondary | ICD-10-CM

## 2012-05-02 DIAGNOSIS — F17200 Nicotine dependence, unspecified, uncomplicated: Secondary | ICD-10-CM

## 2012-05-02 MED ORDER — AZELASTINE HCL 0.1 % NA SOLN: 2.0000 | Freq: Two times a day (BID) | NASAL | Status: DC | PRN

## 2012-05-02 NOTE — Progress Notes (Signed)
Patient ID: [name], female    DOB: December 07, 1955, 56 y.o.   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 Last here November 22, 2010. Still smoking 1/3 ppd. We discussed her mood changes with Chantix. She has never tried patches.  Coughing more in hot weather, around strong smells. Short of breath may be a little worse over time, especially if carrying something. Denies chest pain or palpitation. Feet and legs swell some and she notes nocturia x 2-3.   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 Husband is here. She has had flu and pneumonia vaccines. Acute visit-new respiratory infection started about 2 days ago with dry cough, fever to 100 and 101, no chills, frontal headache, nasal congestion. She denies rash, nodes, GI upset or myalgias/arthralgias. She had called our on-call physician yesterday is going to sent Levaquin but her drugstore was already closed. A couple of grandchildren have been recently ill with viral syndromes.   05/02/12- 56 yoF former smoker on replacement for a1AT deficiency, with severe COPD, complicated by OSA and fibromyalgia. Says she quit smoking 04/01/12 !  Here with granddaughter.  Her COPD assessment test(CAT) score today 17/40. Or treatment cleared her bronchitis symptoms in December. In April she got bronchitis again treated by her primary physician with a home nebulizer/albuterol, used twice a day. Chronic productive cough, usually  white. CXR 11/02/11-reviewed with her IMPRESSION:  There is no evidence of acute cardiac or pulmonary process.  Original Report Authenticated By: Brandon Melnick, M.D.  Dr. Christell Constant did a chest x-ray 03/19/2012-COPD with emphysema and bronchitis changes. She says Advair caused edema. She continues alpha replacement Zemaira/ [facility].   ROS-see HPI Constitutional:   No-    weight loss, night sweats, fevers, chills, fatigue, lassitude. HEENT:   No-  headaches, difficulty swallowing, tooth/dental problems, sore throat,       No-  sneezing, itching, ear ache, nasal congestion, post nasal drip,  CV:  No-   chest pain, orthopnea, PND, swelling in lower extremities, anasarca, dizziness, palpitations Resp: +shortness of breath with exertion or at rest.              +productive cough,  No non-productive cough,  No- coughing up of blood.              + change in color of mucus.  No- wheezing.   Skin: No-   rash or lesions. GI:  No-   heartburn, indigestion, abdominal pain, nausea, vomiting, GU:  MS:  No-   joint pain or swelling.   Neuro-     nothing unusual Psych:  No- change in mood or affect. No depression or anxiety.  No memory loss.   Objective:   Physical Exam General- Alert, Oriented, Affect-appropriate, Distress- none acute, obese Skin- rash-none, lesions- none, excoriation- none Lymphadenopathy- none Head- atraumatic            Eyes- Gross vision intact, PERRLA, conjunctivae clear secretions            Ears- Hearing, canals-normal            Nose- congested, no-Septal dev, mucus, polyps, erosion, perforation             Throat- Mallampati II , mucosa clear , +drainage- white, mucoid, tonsils- atrophic, tongue coated Neck- flexible , trachea midline, no stridor , thyroid nl,  carotid no bruit Chest - symmetrical excursion , unlabored           Heart/CV- RRR , no murmur , no gallop  , no rub, nl s1 s2                           - JVD- none , edema- none, stasis changes- none, varices- none           Lung- + loose cough , +bilateral rhonchi, dullness-none, rub- none           Chest wall-  Abd-  Br/ Gen/ Rectal- Not done, not indicated Extrem- cyanosis- none, clubbing, none, atrophy- none, strength- nl Neuro- grossly intact to observation

## 2012-05-02 NOTE — Patient Instructions (Addendum)
Continue present treatment. You can use your nebulizer up to 4 times per day if needed. f that isn't holding you, then please let me know.   Script sent to refill Astelin

## 2012-05-08 DIAGNOSIS — J310 Chronic rhinitis: Secondary | ICD-10-CM | POA: Insufficient documentation

## 2012-05-08 HISTORY — DX: Chronic rhinitis: J31.0

## 2012-05-08 NOTE — Assessment & Plan Note (Signed)
She has been very compliant with long-term therapy.

## 2012-05-08 NOTE — Assessment & Plan Note (Signed)
She is encouraged to remain firmly off of cigarettes

## 2012-05-08 NOTE — Assessment & Plan Note (Signed)
We discussed nebulized bronchodilators. Consider another trial of inhaled steroid.

## 2012-05-08 NOTE — Assessment & Plan Note (Signed)
Plan-refill Astelin. Use Mucinex if needed. Maintain hydration.

## 2012-06-07 ENCOUNTER — Other Ambulatory Visit: Payer: Self-pay | Admitting: Internal Medicine

## 2012-11-05 ENCOUNTER — Encounter: Payer: Self-pay | Admitting: Internal Medicine

## 2012-11-05 ENCOUNTER — Ambulatory Visit (INDEPENDENT_AMBULATORY_CARE_PROVIDER_SITE_OTHER): Payer: Medicare Other | Admitting: Internal Medicine

## 2012-11-05 ENCOUNTER — Ambulatory Visit (INDEPENDENT_AMBULATORY_CARE_PROVIDER_SITE_OTHER)
Admission: RE | Admit: 2012-11-05 | Discharge: 2012-11-05 | Disposition: A | Discharge status: Home / Self Care | Payer: Medicare Other | Source: Ambulatory Visit | Attending: Internal Medicine | Admitting: Internal Medicine

## 2012-11-05 VITALS — BP 130/72 | HR 99 | Ht 65.0 in | Wt 203.0 lb

## 2012-11-05 DIAGNOSIS — F17200 Nicotine dependence, unspecified, uncomplicated: Secondary | ICD-10-CM

## 2012-11-05 DIAGNOSIS — E8809 Other disorders of plasma-protein metabolism, not elsewhere classified: Secondary | ICD-10-CM

## 2012-11-05 DIAGNOSIS — J449 Chronic obstructive pulmonary disease, unspecified: Secondary | ICD-10-CM

## 2012-11-05 DIAGNOSIS — J4489 Other specified chronic obstructive pulmonary disease: Secondary | ICD-10-CM

## 2012-11-05 NOTE — Patient Instructions (Addendum)
Order- CXR   Dx COPD, ? Fibrosis  We can continue Zemaira a1AT replacement  There is some risk of repeat or worse reaction to flu vax, so no guarantee can be given. It would help to pretreat yourself with an antihistamine an hour before the shot if you decide to do it.   Order PFT next visit   Dx COPD

## 2012-11-05 NOTE — Progress Notes (Signed)
Patient ID: [name], female    DOB: Jun 19, 1956, 56 y.o.   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 Last here November 22, 2010. Still smoking 1/3 ppd. We discussed her mood changes with Chantix. She has never tried patches.  Coughing more in hot weather, around strong smells. Short of breath may be a little worse over time, especially if carrying something. Denies chest pain or palpitation. Feet and legs swell some and she notes nocturia x 2-3.   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 Husband is here. She has had flu and pneumonia vaccines. Acute visit-new respiratory infection started about 2 days ago with dry cough, fever to 100 and 101, no chills, frontal headache, nasal congestion. She denies rash, nodes, GI upset or myalgias/arthralgias. She had called our on-call physician yesterday is going to sent Levaquin but her drugstore was already closed. A couple of grandchildren have been recently ill with viral syndromes.   05/02/12- 56 yoF former smoker on replacement for a1AT deficiency, with severe COPD, complicated by OSA and fibromyalgia. Says she quit smoking 04/01/12 !  Here with granddaughter.  Her COPD assessment test(CAT) score today 17/40. Or treatment cleared her bronchitis symptoms in December. In April she got bronchitis again treated by her primary physician with a home nebulizer/albuterol, used twice a day. Chronic productive cough, usually  white. CXR 11/02/11-reviewed with her IMPRESSION:  There is no evidence of acute cardiac or pulmonary process.  Original Report Authenticated By: Brandon Melnick, M.D.  Dr. Christell Constant did a chest x-ray 03/19/2012-COPD with emphysema and bronchitis changes. She says Advair caused edema. She continues alpha replacement Zemaira/ [facility].  11/05/12-  56 yoF former smoker on  replacement for a1AT deficiency, with severe COPD, complicated by OSA and fibromyalgia. FOLLOWS FOR: doing good overall; no more than usual SOB, wheezing ,cough, or congestion    Daughter here Had large local reaction with heat and rash after flu shot last year( Fluzone) so we discussed different brands. She continues on Zemaira IV/ [facility]. replacement therapy for alpha-1 antitrypsin deficiency CXR 03/19/12- recommended follow-up attention to ? Early fibrotic changes   ROS-see HPI Constitutional:   No-   weight loss, night sweats, fevers, chills, fatigue, lassitude. HEENT:   No-  headaches, difficulty swallowing, tooth/dental problems, sore throat,       No-  sneezing, itching, ear ache, nasal congestion, post nasal drip,  CV:  No-   chest pain, orthopnea, PND, swelling in lower extremities, anasarca, dizziness, palpitations Resp: +shortness of breath with exertion or at rest.              +productive cough,  No non-productive cough,  No- coughing up of blood.              + change in color of mucus.  No- wheezing.   Skin: No-   rash or lesions. GI:  No-   heartburn, indigestion, abdominal pain, nausea, vomiting, GU:  MS:  No-   joint pain or swelling.   Neuro-     nothing unusual Psych:  No- change in mood or affect. No depression or anxiety.  No memory loss.   Objective:   Physical Exam General- Alert, Oriented, Affect-appropriate, Distress- none acute, obese Skin- rash-none, lesions- none, excoriation- none Lymphadenopathy- none Head- atraumatic  Eyes- Gross vision intact, PERRLA, conjunctivae clear secretions            Ears- Hearing, canals-normal            Nose- congested, no-Septal dev, mucus, polyps, erosion, perforation             Throat- Mallampati II , mucosa clear , +drainage- white, mucoid, tonsils- atrophic,  Neck- flexible , trachea midline, no stridor , thyroid nl, carotid no bruit Chest - symmetrical excursion , unlabored           Heart/CV- RRR , no  murmur , no gallop  , no rub, nl s1 s2                           - JVD- none , edema- none, stasis changes- none, varices- none           Lung- + loose cough , +bilateral rhonchi, dullness-none, rub- none. Unlabored           Chest wall-  Abd-  Br/ Gen/ Rectal- Not done, not indicated Extrem- cyanosis- none, clubbing, none, atrophy- none, strength- nl Neuro- grossly intact to observation

## 2012-11-17 NOTE — Assessment & Plan Note (Signed)
She is prepared to continue this.

## 2012-11-17 NOTE — Assessment & Plan Note (Signed)
She has been mostly successful remaining off cigarettes. Chest x-ray report from her primary office recommended followup some fibrotic change. Plan-chest x-ray

## 2012-11-17 NOTE — Assessment & Plan Note (Signed)
She is willing to take the risk one more time large local reaction after flu shot.

## 2013-01-14 ENCOUNTER — Telehealth: Payer: Self-pay | Admitting: Internal Medicine

## 2013-01-14 NOTE — Telephone Encounter (Signed)
Pt scheduled for previsit 02/17/13 @3 :30pm. Pt scheduled for Colon with Dr. Marina Goodell 03/04/13 @9 :30am. Pt aware of appt dates and times.

## 2013-01-14 NOTE — Telephone Encounter (Signed)
Given the history of rectal bleeding and a colonoscopy 7 years ago, go ahead and set her up for colonoscopy as recommended by her PCP

## 2013-01-14 NOTE — Telephone Encounter (Signed)
Pt states her PCP, Dr. Christell Constant, instructed her to call and schedule her colonoscopy. Per recall in the system pt is due for recall in 2017. Pt had colon in 2007 with some polyps removed. Pt states ago she was placed on an antibiotic due to COPD and she started having some bright red bleeding from her rectum. Pt states she was told by PCP to take align and the bleeding stopped. Pt states she is not having any problems at present. Pt wants to know if she should go ahead and schedule the colon or does she need an office visit. Dr. Marina Goodell please advise.

## 2013-01-14 NOTE — Telephone Encounter (Signed)
The patient had a colonoscopy 05/20/2010 when she saw Dr. Marina Goodell when she was having some problems.  It shows in Epic that she has a recall colon for 05/20/16.  Patient's primary doctor, Dr. Christell Constant, says it is time for her to have a colonoscopy.  Patient is wanting to know if she needs to schedule this or can she wait until 05/20/16.  She is not having any problems.

## 2013-01-28 ENCOUNTER — Telehealth: Payer: Self-pay | Admitting: Internal Medicine

## 2013-01-28 NOTE — Telephone Encounter (Signed)
Forms given to CY.  

## 2013-01-28 NOTE — Telephone Encounter (Signed)
Called [facility], spoke with Synetta Fail.  Calling to check on the status of the Plan of Treatment nursing orders for weekly Zemaira infusions for the date of 2/28-5/28.  States they faxed this to the front fax # on Feb 11, 18, and 26.  I do not see this scanned into pt's chart.  Katie, do you have this fax?  Pls advise.  Thank you.  Synetta Fail will refax this to triage just incase it wasn't received.

## 2013-01-29 NOTE — Telephone Encounter (Signed)
Forms faxed to [facility] and sent to scan in EPIC. Nothing more needed at this time.

## 2013-01-31 ENCOUNTER — Other Ambulatory Visit: Payer: Self-pay | Admitting: *Deleted

## 2013-01-31 DIAGNOSIS — E559 Vitamin D deficiency, unspecified: Secondary | ICD-10-CM

## 2013-02-11 ENCOUNTER — Other Ambulatory Visit: Payer: Self-pay | Admitting: Family Medicine

## 2013-02-13 ENCOUNTER — Encounter: Payer: Self-pay | Admitting: Family Medicine

## 2013-02-13 ENCOUNTER — Ambulatory Visit (INDEPENDENT_AMBULATORY_CARE_PROVIDER_SITE_OTHER): Payer: Medicare Other | Admitting: Family Medicine

## 2013-02-13 VITALS — BP 123/72 | HR 81 | Temp 98.2°F | Ht 65.0 in | Wt 201.0 lb

## 2013-02-13 DIAGNOSIS — J309 Allergic rhinitis, unspecified: Secondary | ICD-10-CM

## 2013-02-13 DIAGNOSIS — E559 Vitamin D deficiency, unspecified: Secondary | ICD-10-CM

## 2013-02-13 DIAGNOSIS — R5381 Other malaise: Secondary | ICD-10-CM

## 2013-02-13 DIAGNOSIS — J449 Chronic obstructive pulmonary disease, unspecified: Secondary | ICD-10-CM

## 2013-02-13 DIAGNOSIS — R42 Dizziness and giddiness: Secondary | ICD-10-CM

## 2013-02-13 DIAGNOSIS — J4489 Other specified chronic obstructive pulmonary disease: Secondary | ICD-10-CM

## 2013-02-13 DIAGNOSIS — E785 Hyperlipidemia, unspecified: Secondary | ICD-10-CM

## 2013-02-13 DIAGNOSIS — E8809 Other disorders of plasma-protein metabolism, not elsewhere classified: Secondary | ICD-10-CM

## 2013-02-13 DIAGNOSIS — M797 Fibromyalgia: Secondary | ICD-10-CM

## 2013-02-13 DIAGNOSIS — R5383 Other fatigue: Secondary | ICD-10-CM

## 2013-02-13 DIAGNOSIS — IMO0001 Reserved for inherently not codable concepts without codable children: Secondary | ICD-10-CM

## 2013-02-13 LAB — POCT CBC
HCT, POC: 46.1 % (ref 37.7–47.9)
Hemoglobin: 15.6 g/dL (ref 12.2–16.2)
MCH, POC: 30.7 pg (ref 27–31.2)
MCHC: 33.8 g/dL (ref 31.8–35.4)
POC Granulocyte: 4.8 (ref 2–6.9)
POC LYMPH PERCENT: 39.5 %L (ref 10–50)
RDW, POC: 14.1 %
WBC: 8.6 10*3/uL (ref 4.6–10.2)

## 2013-02-13 LAB — HEPATIC FUNCTION PANEL
AST: 24 U/L (ref 0–37)
Alkaline Phosphatase: 114 U/L (ref 39–117)
Indirect Bilirubin: 0.3 mg/dL (ref 0.0–0.9)
Total Bilirubin: 0.4 mg/dL (ref 0.3–1.2)

## 2013-02-13 LAB — BASIC METABOLIC PANEL WITH GFR
CO2: 30 mEq/L (ref 19–32)
Calcium: 9.9 mg/dL (ref 8.4–10.5)
Chloride: 100 mEq/L (ref 96–112)
Potassium: 4.3 mEq/L (ref 3.5–5.3)
Sodium: 138 mEq/L (ref 135–145)

## 2013-02-13 LAB — THYROID PANEL WITH TSH: TSH: 1.939 u[IU]/mL (ref 0.350–4.500)

## 2013-02-13 MED ORDER — FLUTICASONE PROPIONATE 50 MCG/ACT NA SUSP: 2.0000 | Freq: Every day | NASAL | Status: DC

## 2013-02-13 MED ORDER — MECLIZINE HCL 12.5 MG PO TABS: 12.5000 mg | ORAL_TABLET | Freq: Three times a day (TID) | ORAL | Status: DC | PRN

## 2013-02-13 NOTE — Patient Instructions (Addendum)
FOBT given Continue current meds and therapeutic lifestyle changes For chronic medication refills, please call pharmacy take meclizine regularly for one week and then as needed Use Flonase regularly 1-2 sprays at nighttime in each nostril

## 2013-02-13 NOTE — Progress Notes (Signed)
  Subjective:    Patient ID: [name], female    DOB: 21-Apr-1956, 57 y.o.   MRN: [id number]  HPI This patient presents for recheck of multiple medical problems.   Patient Active Problem List  Diagnosis  . ALPHA 1 ANTITRYPSIN DEFICIENCY  . CHRONIC OBSTRUCTIVE PULMONARY DISEASE, SEVERE  . FIBROMYALGIA  . SLEEP APNEA  . DYSPNEA  . TOBACCO USER  . Rhinitis, nonallergic   In addition some increased dizziness. Worse at nighttime with rolling over in the bed. Also notices with certain movements. This has been going on for about 2 weeks.  The allergies, current medications, past medical history, surgical history, family and social history are reviewed.  Immunizations reviewed.  Health maintenance reviewed. No items are outstanding:       Review of Systems  Eyes: Positive for itching (occasional).  Respiratory: Positive for cough and shortness of breath (as usual). Negative for choking, chest tightness and wheezing.   Cardiovascular: Negative.   Gastrointestinal: Negative.   Genitourinary: Negative.   Musculoskeletal: Positive for myalgias (arms and legs), back pain (mostly lower back) and arthralgias (all over).  Skin: Negative.   Allergic/Immunologic: Food allergies: secondary to fibromyalgia.  Neurological: Positive for dizziness (occasional, most everyday with fast movementespecially the last week), weakness and headaches (occasionalno worse thanthan usual). Negative for syncope.  Hematological: Negative.   Psychiatric/Behavioral: Negative.        Objective:   Physical Exam BP 123/72  Pulse 81  Temp(Src) 98.2 F (36.8 C) (Oral)  Ht 5\' 5"  (1.651 m)  Wt 201 lb (91.173 kg)  BMI 33.45 kg/m2  The patient appeared well nourished and normally developed, alert and oriented to time and place. Speech, behavior and judgement appear normal. Vital signs as documented.  Head exam is unremarkable except for increased nasal congestion . No scleral icterus or pallor noted.   Neck is without jugular venous distension, thyromegally, or carotid bruits. Carotid upstrokes are brisk bilaterally. No cervical adenopathy. Lungs have some scattered rales anteriorly and posteriorly to auscultation, less. than usual. Normal respiratory effort. Cardiac exam reveals regular rate and rhythm. First and second heart sounds normal. No murmurs, rubs or gallops.  Abdominal exam reveals normal bowl sounds, no masses, no organomegaly and no aortic enlargement. No inguinal adenopathy. Extremities are nonedematous and both femoral and pedal pulses are normal. Skin without pallor or jaundice.  Warm and dry, without rash. Small insect bite right back. Neurologic exam reveals normal deep tendon reflexes and normal sensation with generalized muscle soreness          Assessment & Plan:    1. Other and unspecified hyperlipidemia  - Hepatic function panel - NMR Lipoprofile with Lipids - BASIC METABOLIC PANEL WITH GFR  2. Unspecified vitamin D deficiency  - Vitamin D 25 hydroxy  3. Fibromyalgia   4. Other malaise and fatigue  - POCT CBC - Thyroid Panel With TSH  5. CHRONIC OBSTRUCTIVE PULMONARY DISEASE, SEVERE   6. ALPHA 1 ANTITRYPSIN DEFICIENCY   7. FIBROMYALGIA   8. Dizziness and giddiness  - meclizine (ANTIVERT) 12.5 MG tablet; Take 1 tablet (12.5 mg total) by mouth 3 (three) times daily as needed.  Dispense: 30 tablet; Refill: 0  9. Allergic rhinitis  - fluticasone (FLONASE) 50 MCG/ACT nasal spray; Place 2 sprays into the nose daily.  Dispense: 16 g; Refill: 6 - meclizine (ANTIVERT) 12.5 MG tablet; Take 1 tablet (12.5 mg total) by mouth 3 (three) times daily as needed.  Dispense: 30 tablet; Refill: 0

## 2013-02-17 ENCOUNTER — Ambulatory Visit (AMBULATORY_SURGERY_CENTER): Payer: Medicare Other | Admitting: *Deleted

## 2013-02-17 VITALS — Ht 65.0 in | Wt 204.4 lb

## 2013-02-17 DIAGNOSIS — K625 Hemorrhage of anus and rectum: Secondary | ICD-10-CM

## 2013-02-17 LAB — NMR LIPOPROFILE WITH LIPIDS
Cholesterol, Total: 200 mg/dL — ABNORMAL HIGH (ref <200)
HDL Particle Number: 36.1 umol/L (ref >=30.5)
HDL-C: 49 mg/dL (ref >=40)
LDL (calc): 116 mg/dL — ABNORMAL HIGH (ref <100)
LP-IR Score: 73 — ABNORMAL HIGH (ref <=45)
Large HDL-P: 2.4 umol/L — ABNORMAL LOW (ref >=4.8)
Triglycerides: 175 mg/dL — ABNORMAL HIGH (ref <150)

## 2013-02-17 MED ORDER — MOVIPREP 100 G PO SOLR: ORAL | Status: DC

## 2013-02-18 ENCOUNTER — Ambulatory Visit (INDEPENDENT_AMBULATORY_CARE_PROVIDER_SITE_OTHER): Payer: Medicare Other | Admitting: Nurse Practitioner

## 2013-02-18 ENCOUNTER — Encounter: Payer: Self-pay | Admitting: Internal Medicine

## 2013-02-18 ENCOUNTER — Encounter: Payer: Self-pay | Admitting: Nurse Practitioner

## 2013-02-18 VITALS — BP 133/75 | HR 94 | Temp 97.0°F | Ht 65.0 in | Wt 205.0 lb

## 2013-02-18 DIAGNOSIS — R87629 Unspecified abnormal cytological findings in specimens from vagina: Secondary | ICD-10-CM

## 2013-02-18 DIAGNOSIS — Z01419 Encounter for gynecological examination (general) (routine) without abnormal findings: Secondary | ICD-10-CM

## 2013-02-18 LAB — POCT UA - MICROSCOPIC ONLY
Casts, Ur, LPF, POC: NEGATIVE
Crystals, Ur, HPF, POC: NEGATIVE
Mucus, UA: NEGATIVE
Yeast, UA: NEGATIVE

## 2013-02-18 LAB — POCT URINALYSIS DIPSTICK
Bilirubin, UA: NEGATIVE
Glucose, UA: NEGATIVE
Ketones, UA: NEGATIVE
Nitrite, UA: POSITIVE
Protein, UA: NEGATIVE
Spec Grav, UA: <=1.005
Urobilinogen, UA: NEGATIVE
pH, UA: 7.5

## 2013-02-18 NOTE — Progress Notes (Signed)
Subjective:    Patient ID: [name], female    DOB: 11-Jan-1956, 57 y.o.   MRN: [id number]  HPIPatient had a PAP 06/13/12 that came back abnormal LSIL. Was sent to GYN and had BX which was abnormal but no cancer cells. Nothing further was done. Was told to repaet PAP in 6 months. Here to day for repeat.    Review of Systems  Genitourinary: Negative for vaginal bleeding, vaginal discharge and vaginal pain.       LMP 10 years ago   Allergies  Allergen Reactions  . Penicillins Nausea And Vomiting  . Actonel (Risedronate Sodium)   . Advair Diskus (Fluticasone-Salmeterol)   . Aspirin Nausea Only    Stomach burns; abdominal pain  . Levofloxacin     Reports hx of GI distress and GI bleed  . Morphine Itching and Swelling    Throat swells  . Nsaids     Swelling, GI upset   . Omnicef (Cefdinir)   . Erythromycin Rash  . Iodine Rash    Topical and IV    Outpatient Encounter Prescriptions as of 02/18/2013  Medication Sig Dispense Refill  . albuterol (PROVENTIL) (2.5 MG/3ML) 0.083% nebulizer solution Take 2.5 mg by nebulization every 6 (six) hours as needed.      Marland Kitchen alpha-1-proteinase inhibitor, human, (ZEMAIRA) 1000 MG SOLR Inject 60 mg/kg into the vein once a week.        Marland Kitchen amitriptyline (ELAVIL) 75 MG tablet Take 75 mg by mouth at bedtime.        Marland Kitchen FLUoxetine (PROZAC) 20 MG capsule Take 20 mg by mouth daily.        . fluticasone (FLONASE) 50 MCG/ACT nasal spray Place 2 sprays into the nose daily.  16 g  6  . guaiFENesin (MUCINEX) 600 MG 12 hr tablet Take 1,200 mg by mouth 2 (two) times daily.        . hydrocodone-acetaminophen (LORCET-HD) 5-500 MG per capsule Take 1 capsule by mouth 2 (two) times daily.        . meclizine (ANTIVERT) 12.5 MG tablet Take 1 tablet (12.5 mg total) by mouth 3 (three) times daily as needed.  30 tablet  0  . MOVIPREP 100 G SOLR moviprep as directed. No substitutions  1 kit  0  . omeprazole (PRILOSEC) 40 MG capsule TAKE 1 CAPSULE DAILY FOR GASTRIC  ESOPHAGEAL REFLUX DISEASE  90 capsule  0  . rosuvastatin (CRESTOR) 20 MG tablet Take 20 mg by mouth daily.        . SYMBICORT 80-4.5 MCG/ACT inhaler INHALE 2 PUFFS INTO THE LUNGS 2 TIMES A DAY  10.2 g  4  . tiotropium (SPIRIVA) 18 MCG inhalation capsule Place 18 mcg into inhaler and inhale daily.        Marland Kitchen ZETIA 10 MG tablet TAKE 1 TABLET ONCE A DAY  30 tablet  2  . furosemide (LASIX) 20 MG tablet 1 daily for occasional use - diuretic  30 tablet  11  . lansoprazole (PREVACID) 30 MG capsule Take 30 mg by mouth daily.         No facility-administered encounter medications on file as of 02/18/2013.    Past Medical History  Diagnosis Date  . Cervical cancer   . Alpha-1-antitrypsin deficiency   . COPD (chronic obstructive pulmonary disease)   . Hyperlipidemia   . Fibromyalgia   . GERD (gastroesophageal reflux disease)   . IBS (irritable bowel syndrome)   . Vitamin D deficiency   . Osteoporosis   .  Cervical cancer 2004    Past Surgical History  Procedure Laterality Date  . Lumbar disc surgery    . Radical hysterectomy    . Abdominal hysterectomy      History   Social History  . Marital Status: Married    Spouse Name: Clide Cliff    Number of Children: N/A  . Years of Education: N/A   Occupational History  . disabled    Social History Main Topics  . Smoking status: Former Smoker -- 0.50 packs/day    Types: Cigarettes    Start date: 11/20/1972    Quit date: 04/01/2012  . Smokeless tobacco: Never Used  . Alcohol Use: No  . Drug Use: No  . Sexually Active: Yes   Other Topics Concern  . Not on file   Social History Narrative  . No narrative on file          Objective:   Physical Exam  Constitutional: She is oriented to person, place, and time. She appears well-developed and well-nourished.  Cardiovascular: Normal rate, regular rhythm, normal heart sounds and intact distal pulses.   No murmur heard. Pulmonary/Chest: Effort normal and breath sounds normal.   Genitourinary:  Vaginal mucosa pink and atrophic Vaginal cuff intact No masses Bimanual exam -no tenderness no adnexal masses  Neurological: She is alert and oriented to person, place, and time.    BP 133/75  Pulse 94  Temp(Src) 97 F (36.1 C) (Oral)  Ht 5\' 5"  (1.651 m)  Wt 205 lb (92.987 kg)  BMI 34.11 kg/m2 Results for orders placed in visit on 02/18/13  POCT UA - MICROSCOPIC ONLY      Result Value Range   WBC, Ur, HPF, POC 5-10     RBC, urine, microscopic 1-5     Bacteria, U Microscopic many     Mucus, UA neg     Epithelial cells, urine per micros occ     Crystals, Ur, HPF, POC neg     Casts, Ur, LPF, POC neg     Yeast, UA neg    POCT URINALYSIS DIPSTICK      Result Value Range   Color, UA yellow     Clarity, UA clear     Glucose, UA neg     Bilirubin, UA neg     Ketones, UA neg     Spec Grav, UA <=1.005     Blood, UA trace     pH, UA 7.5     Protein, UA neg     Urobilinogen, UA negative     Nitrite, UA positive     Leukocytes, UA moderate (2+)          Assessment & Plan:  History Abnormal Pap  Keep routine F/U with Dr. Christell Constant  Will call with results of PAP Mary-Margaret Daphine Deutscher, FNP

## 2013-02-18 NOTE — Patient Instructions (Signed)

## 2013-02-19 ENCOUNTER — Other Ambulatory Visit: Payer: Self-pay | Admitting: Nurse Practitioner

## 2013-02-19 ENCOUNTER — Other Ambulatory Visit: Payer: Self-pay | Admitting: *Deleted

## 2013-02-19 LAB — PAP IG (IMAGE GUIDED)

## 2013-02-19 MED ORDER — NITROFURANTOIN MONOHYD MACRO 100 MG PO CAPS: 100.0000 mg | ORAL_CAPSULE | Freq: Two times a day (BID) | ORAL | Status: DC

## 2013-02-19 MED ORDER — ALBUTEROL SULFATE HFA 108 (90 BASE) MCG/ACT IN AERS: 2.0000 | INHALATION_SPRAY | RESPIRATORY_TRACT | Status: DC | PRN

## 2013-02-24 ENCOUNTER — Telehealth: Payer: Self-pay | Admitting: Nurse Practitioner

## 2013-02-24 NOTE — Telephone Encounter (Signed)
Patient was calling back on labs she is aware

## 2013-03-04 ENCOUNTER — Encounter: Payer: Self-pay | Admitting: Internal Medicine

## 2013-03-04 ENCOUNTER — Ambulatory Visit (AMBULATORY_SURGERY_CENTER): Payer: Medicare Other | Admitting: Internal Medicine

## 2013-03-04 VITALS — BP 112/77 | HR 74 | Temp 98.1°F | Resp 20 | Ht 65.0 in | Wt 204.0 lb

## 2013-03-04 DIAGNOSIS — D126 Benign neoplasm of colon, unspecified: Secondary | ICD-10-CM

## 2013-03-04 DIAGNOSIS — K625 Hemorrhage of anus and rectum: Secondary | ICD-10-CM

## 2013-03-04 MED ORDER — SODIUM CHLORIDE 0.9 % IV SOLN: 500.0000 mL | INTRAVENOUS | Status: DC

## 2013-03-04 NOTE — Progress Notes (Signed)
Called to room to assist during endoscopic procedure.  Patient ID and intended procedure confirmed with present staff. Received instructions for my participation in the procedure from the performing physician. ewm 

## 2013-03-04 NOTE — Op Note (Signed)
[facility] [address] [place], [address]   COLONOSCOPY PROCEDURE REPORT  PATIENT: [name], [name].  MR#: [id number] BIRTHDATE: 13-Aug-1956 , 56  yrs. old GENDER: Female ENDOSCOPIST: Roxy Cedar, MD REFERRED MW:[id number] Christell Constant, M.D. - direct referal PROCEDURE DATE:  03/04/2013 PROCEDURE:   Colonoscopy with snare polypectomy    x 1 ASA CLASS:   Class II INDICATIONS:hematochezia.   Rectal bleeding 6 months ago. Prior colonoscopy 2007 w/ hyperplastic polyp MEDICATIONS: MAC sedation, administered by CRNA and propofol (Diprivan) 250mg  IV  DESCRIPTION OF PROCEDURE:   After the risks benefits and alternatives of the procedure were thoroughly explained, informed consent was obtained.  A digital rectal exam revealed no abnormalities of the rectum.   The LB [id number]  endoscope was introduced through the anus and advanced to the cecum, which was identified by both the appendix and ileocecal valve. No adverse events experienced.   The quality of the prep was excellent, using MoviPrep  The instrument was then slowly withdrawn as the colon was fully examined.      COLON FINDINGS: A sessile polyp measuring 6 mm in size was found in the rectum.  A polypectomy was performed with a cold snare.  The resection was complete and the polyp tissue was completely retrieved.   The colon mucosa was otherwise normal.  Retroflexed views revealed internal hemorrhoids. The time to cecum=4 minutes 18 seconds.  Withdrawal time=8 minutes 40 seconds.  The scope was withdrawn and the procedure completed. COMPLICATIONS: There were no complications.  ENDOSCOPIC IMPRESSION: 1.   Sessile polyp measuring 6 mm in size was found in the rectum; polypectomy was performed with a cold snare 2.   The colon mucosa was otherwise normal  RECOMMENDATIONS: 1. Repeat colonoscopy in 5 years if polyp adenomatous; otherwise 10 years   eSigned:  Roxy Cedar, MD 03/04/2013 10:01  AM   cc: Rudi Heap, MD and The Patient

## 2013-03-04 NOTE — Progress Notes (Signed)
Patient did not experience any of the following events: a burn prior to discharge; a fall within the facility; wrong site/side/patient/procedure/implant event; or a hospital transfer or hospital admission upon discharge from the facility. (G8907) Patient did not have preoperative order for IV antibiotic SSI prophylaxis. (G8918)  

## 2013-03-04 NOTE — Patient Instructions (Addendum)
YOU HAD AN ENDOSCOPIC PROCEDURE TODAY AT THE [facility]: Refer to the procedure report that was given to you for any specific questions about what was found during the examination.  If the procedure report does not answer your questions, please call your gastroenterologist to clarify.  If you requested that your care partner not be given the details of your procedure findings, then the procedure report has been included in a sealed envelope for you to review at your convenience later.  YOU SHOULD EXPECT: Some feelings of bloating in the abdomen. Passage of more gas than usual.  Walking can help get rid of the air that was put into your GI tract during the procedure and reduce the bloating. If you had a lower endoscopy (such as a colonoscopy or flexible sigmoidoscopy) you may notice spotting of blood in your stool or on the toilet paper. If you underwent a bowel prep for your procedure, then you may not have a normal bowel movement for a few days.  DIET: Your first meal following the procedure should be a light meal and then it is ok to progress to your normal diet.  A half-sandwich or bowl of soup is an example of a good first meal.  Heavy or fried foods are harder to digest and may make you feel nauseous or bloated.  Likewise meals heavy in dairy and vegetables can cause extra gas to form and this can also increase the bloating.  Drink plenty of fluids but you should avoid alcoholic beverages for 24 hours.  ACTIVITY: Your care partner should take you home directly after the procedure.  You should plan to take it easy, moving slowly for the rest of the day.  You can resume normal activity the day after the procedure however you should NOT DRIVE or use heavy machinery for 24 hours (because of the sedation medicines used during the test).    SYMPTOMS TO REPORT IMMEDIATELY: A gastroenterologist can be reached at any hour.  During normal business hours, 8:30 AM to 5:00 PM Monday through Friday,  call [phone].  After hours and on weekends, please call the GI answering service at [phone] who will take a message and have the physician on call contact you.   Following lower endoscopy (colonoscopy or flexible sigmoidoscopy):  Excessive amounts of blood in the stool  Significant tenderness or worsening of abdominal pains  Swelling of the abdomen that is new, acute  Fever of 100F or higher  Following upper endoscopy (EGD)  Vomiting of blood or coffee ground material  New chest pain or pain under the shoulder blades  Painful or persistently difficult swallowing  New shortness of breath  Fever of 100F or higher  Black, tarry-looking stools  FOLLOW UP: If any biopsies were taken you will be contacted by phone or by letter within the next 1-3 weeks.  Call your gastroenterologist if you have not heard about the biopsies in 3 weeks.  Our staff will call the home number listed on your records the next business day following your procedure to check on you and address any questions or concerns that you may have at that time regarding the information given to you following your procedure. This is a courtesy call and so if there is no answer at the home number and we have not heard from you through the emergency physician on call, we will assume that you have returned to your regular daily activities without incident.  SIGNATURES/CONFIDENTIALITY: You and/or your care   partner have signed paperwork which will be entered into your electronic medical record.  These signatures attest to the fact that that the information above on your After Visit Summary has been reviewed and is understood.  Full responsibility of the confidentiality of this discharge information lies with you and/or your care-partner.  

## 2013-03-04 NOTE — Progress Notes (Signed)
Lidocaine-40mg IV prior to Propofol InductionPropofol given over incremental dosages 

## 2013-03-05 ENCOUNTER — Ambulatory Visit: Payer: Self-pay

## 2013-03-05 ENCOUNTER — Telehealth: Payer: Self-pay

## 2013-03-05 ENCOUNTER — Other Ambulatory Visit: Payer: Self-pay

## 2013-03-05 NOTE — Telephone Encounter (Signed)
  Follow up Call-  Call back number 03/04/2013  Post procedure Call Back phone  # [phone]  Permission to leave phone message Yes     Patient questions:  Do you have a fever, pain , or abdominal swelling? no Pain Score  0 *  Have you tolerated food without any problems? yes  Have you been able to return to your normal activities? yes  Do you have any questions about your discharge instructions: Diet   no Medications  no Follow up visit  no  Do you have questions or concerns about your Care? no  Actions: * If pain score is 4 or above: No action needed, pain <4.  No problems per the pt. Maw

## 2013-03-11 ENCOUNTER — Encounter: Payer: Self-pay | Admitting: Internal Medicine

## 2013-03-11 ENCOUNTER — Other Ambulatory Visit: Payer: Self-pay

## 2013-03-11 MED ORDER — ROSUVASTATIN CALCIUM 20 MG PO TABS: 20.0000 mg | ORAL_TABLET | Freq: Every day | ORAL | Status: DC

## 2013-04-24 ENCOUNTER — Encounter: Payer: Self-pay | Admitting: *Deleted

## 2013-05-06 ENCOUNTER — Encounter: Payer: Self-pay | Admitting: Internal Medicine

## 2013-05-06 ENCOUNTER — Ambulatory Visit (INDEPENDENT_AMBULATORY_CARE_PROVIDER_SITE_OTHER): Payer: Medicare Other | Admitting: Internal Medicine

## 2013-05-06 VITALS — BP 108/66 | HR 90 | Ht 65.5 in | Wt 202.0 lb

## 2013-05-06 DIAGNOSIS — J4489 Other specified chronic obstructive pulmonary disease: Secondary | ICD-10-CM

## 2013-05-06 DIAGNOSIS — E8809 Other disorders of plasma-protein metabolism, not elsewhere classified: Secondary | ICD-10-CM

## 2013-05-06 DIAGNOSIS — J449 Chronic obstructive pulmonary disease, unspecified: Secondary | ICD-10-CM

## 2013-05-06 LAB — PULMONARY FUNCTION TEST

## 2013-05-06 NOTE — Progress Notes (Signed)
Patient ID: [name], female    DOB: [birth year], 57 y.o.   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 Last here November 22, 2010. Still smoking 1/3 ppd. We discussed her mood changes with Chantix. She has never tried patches.  Coughing more in hot weather, around strong smells. Short of breath may be a little worse over time, especially if carrying something. Denies chest pain or palpitation. Feet and legs swell some and she notes nocturia x 2-3.   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 Husband is here. She has had flu and pneumonia vaccines. Acute visit-new respiratory infection started about 2 days ago with dry cough, fever to 100 and 101, no chills, frontal headache, nasal congestion. She denies rash, nodes, GI upset or myalgias/arthralgias. She had called our on-call physician yesterday is going to sent Levaquin but her drugstore was already closed. A couple of grandchildren have been recently ill with viral syndromes.   05/02/12- 72 yoF former smoker on replacement for a1AT deficiency, with severe COPD, complicated by OSA and fibromyalgia. Says she quit smoking 04/01/12 !  Here with granddaughter.  Her COPD assessment test(CAT) score today 17/40. Or treatment cleared her bronchitis symptoms in December. In April she got bronchitis again treated by her primary physician with a home nebulizer/albuterol, used twice a day. Chronic productive cough, usually  white. CXR 11/02/11-reviewed with her IMPRESSION:  There is no evidence of acute cardiac or pulmonary process.  Original Report Authenticated By: Brandon Melnick, M.D.  Dr. Christell Constant did a chest x-ray 03/19/2012-COPD with emphysema and bronchitis changes. She says Advair caused edema. She continues alpha replacement Zemaira/ [facility].  11/05/12-  37 yoF former smoker on  replacement for a1AT deficiency, with severe COPD, complicated by OSA and fibromyalgia. FOLLOWS FOR: doing good overall; no more than usual SOB, wheezing ,cough, or congestion    Daughter here Had large local reaction with heat and rash after flu shot last year( Fluzone) so we discussed different brands. She continues on Zemaira IV/ [facility]. replacement therapy for alpha-1 antitrypsin deficiency CXR 03/19/12- recommended follow-up attention to ? Early fibrotic changes   05/06/13-  55 yoF former smoker on replacement for a1AT deficiency, with severe COPD, complicated by OSA and fibromyalgia. Zemaira alpha 1 replacement. FOLLOWS FOR: review PFT results with patient, no more than usual of SOB. She continues her alpha 1 replacement intravenously each week with no problem. Some productive cough with clear sputum. Flonase worked well in the spring for rhinitis. Still noticing dyspnea on exertion especially if carrying a weight. Symbicort helps a lot. PFT- 05/06/2013-reduced FVC may reflect effort but the overall pattern is moderate obstructive airways disease with response to bronchodilator, air trapping, normal diffusion. This doesn't fit entirely with emphysema.  CXR 11/28/12 IMPRESSION:  No acute cardiopulmonary disease. Findings consistent with COPD.  No change from the prior study.  Original Report Authenticated By: Amie Portland, M.D.  ROS-see HPI Constitutional:   No-   weight loss, night sweats, fevers, chills, fatigue, lassitude. HEENT:   No-  headaches, difficulty swallowing, tooth/dental problems, sore throat,       No-  sneezing, itching, ear ache, nasal congestion, post nasal drip,  CV:  No-   chest pain, orthopnea, PND, swelling in lower extremities, anasarca, dizziness, palpitations Resp: +shortness of breath with exertion or at rest.              +  productive cough,  No non-productive cough,  No- coughing up of blood.              + change in color of mucus.  No- wheezing.   Skin: No-    rash or lesions. GI:  No-   heartburn, indigestion, abdominal pain, nausea, vomiting, GU:  MS:  No-   joint pain or swelling.   Neuro-     nothing unusual Psych:  No- change in mood or affect. No depression or anxiety.  No memory loss.   Objective:   Physical Exam General- Alert, Oriented, Affect-appropriate, Distress- none acute, obese Skin- rash-none, lesions- none, excoriation- none Lymphadenopathy- none Head- atraumatic            Eyes- Gross vision intact, PERRLA, conjunctivae clear secretions            Ears- Hearing, canals-normal            Nose- congested, no-Septal dev, mucus, polyps, erosion, perforation             Throat- Mallampati II , mucosa clear , +drainage- white, mucoid, tonsils- atrophic,  Neck- flexible , trachea midline, no stridor , thyroid nl, carotid no bruit Chest - symmetrical excursion , unlabored           Heart/CV- RRR , no murmur , no gallop  , no rub, nl s1 s2                           - JVD- none , edema- none, stasis changes- none, varices- none           Lung- no- cough , +bilateral rhonchi, dullness-none, rub- none. Unlabored           Chest wall-  Abd-  Br/ Gen/ Rectal- Not done, not indicated Extrem- cyanosis- none, clubbing, none, atrophy- none, strength- nl Neuro- grossly intact to observation

## 2013-05-06 NOTE — Progress Notes (Signed)
PFT done today. 

## 2013-05-06 NOTE — Patient Instructions (Addendum)
We can continue present meds including Zemaira replacement  Please call as needed

## 2013-05-07 ENCOUNTER — Other Ambulatory Visit: Payer: Self-pay

## 2013-05-07 ENCOUNTER — Ambulatory Visit: Payer: Self-pay

## 2013-05-19 ENCOUNTER — Other Ambulatory Visit: Payer: Self-pay | Admitting: Family Medicine

## 2013-05-21 NOTE — Assessment & Plan Note (Signed)
PFT shows air trapping but better diffusion capacity and less clear-cut obstruction than expected. She does respond to Symbicort.

## 2013-05-21 NOTE — Assessment & Plan Note (Signed)
She tolerates replacement well and has had no problems with this process.

## 2013-05-26 ENCOUNTER — Telehealth: Payer: Self-pay | Admitting: Family Medicine

## 2013-05-28 NOTE — Telephone Encounter (Signed)
April can you please send pap

## 2013-05-30 NOTE — Telephone Encounter (Signed)
Pap faxed to [facility]

## 2013-06-19 ENCOUNTER — Ambulatory Visit (INDEPENDENT_AMBULATORY_CARE_PROVIDER_SITE_OTHER): Payer: Medicare Other | Admitting: Family Medicine

## 2013-06-19 ENCOUNTER — Encounter: Payer: Self-pay | Admitting: Family Medicine

## 2013-06-19 VITALS — BP 129/78 | HR 76 | Temp 98.2°F | Ht 65.0 in | Wt 201.0 lb

## 2013-06-19 DIAGNOSIS — IMO0001 Reserved for inherently not codable concepts without codable children: Secondary | ICD-10-CM

## 2013-06-19 DIAGNOSIS — J449 Chronic obstructive pulmonary disease, unspecified: Secondary | ICD-10-CM

## 2013-06-19 DIAGNOSIS — R300 Dysuria: Secondary | ICD-10-CM

## 2013-06-19 DIAGNOSIS — L84 Corns and callosities: Secondary | ICD-10-CM

## 2013-06-19 DIAGNOSIS — M797 Fibromyalgia: Secondary | ICD-10-CM

## 2013-06-19 DIAGNOSIS — E559 Vitamin D deficiency, unspecified: Secondary | ICD-10-CM

## 2013-06-19 DIAGNOSIS — R5383 Other fatigue: Secondary | ICD-10-CM

## 2013-06-19 DIAGNOSIS — E785 Hyperlipidemia, unspecified: Secondary | ICD-10-CM

## 2013-06-19 DIAGNOSIS — E8801 Alpha-1-antitrypsin deficiency: Secondary | ICD-10-CM

## 2013-06-19 LAB — POCT URINALYSIS DIPSTICK
Protein, UA: NEGATIVE
Spec Grav, UA: 1.015
Urobilinogen, UA: NEGATIVE

## 2013-06-19 LAB — POCT CBC
Lymph, poc: 2.9 (ref 0.6–3.4)
MCH, POC: 29.9 pg (ref 27–31.2)
MCHC: 33.3 g/dL (ref 31.8–35.4)
MPV: 8 fL (ref 0–99.8)
POC LYMPH PERCENT: 30.6 %L (ref 10–50)
Platelet Count, POC: 245 10*3/uL (ref 142–424)
RDW, POC: 13.9 %

## 2013-06-19 LAB — POCT UA - MICROSCOPIC ONLY

## 2013-06-19 NOTE — Addendum Note (Signed)
Addended by: Lisbeth Ply C on: 06/19/2013 01:05 PM   Modules accepted: Orders

## 2013-06-19 NOTE — Progress Notes (Signed)
Subjective:    Patient ID: KINJAL NEITZKE, female    DOB: 06-05-1956, 57 y.o.   MRN: [id number]  HPI Patient comes in today for followup and management of chronic medical problems. She sees Dr. Jetty Duhamel for her alpha-1 deficiency. She recently saw them and got a good report that everything is stable. There has a history of hyperlipidemia vitamin D deficiency and fibromyalgia.   Review of Systems  Constitutional: Positive for fatigue.  HENT: Negative for ear pain, congestion, postnasal drip and sinus pressure.   Eyes: Negative.  Negative for photophobia, pain, redness, itching and visual disturbance.  Respiratory: Positive for cough (dry). Negative for chest tightness, shortness of breath and wheezing.   Cardiovascular: Positive for leg swelling. Negative for chest pain and palpitations.  Gastrointestinal: Negative for nausea, vomiting, abdominal pain, diarrhea, constipation, blood in stool, abdominal distention and rectal pain.  Endocrine: Negative.  Negative for cold intolerance, heat intolerance, polydipsia, polyphagia and polyuria.  Genitourinary: Positive for dysuria (slight), urgency and frequency. Negative for hematuria, vaginal bleeding, vaginal discharge and vaginal pain.  Musculoskeletal: Positive for myalgias (due to fibromyalgia), back pain and arthralgias (bilateral knees, albows, hips).  Skin: Negative.  Negative for color change, pallor, rash and wound.  Allergic/Immunologic: Positive for environmental allergies.  Neurological: Positive for headaches (occasional). Negative for dizziness, tremors, weakness and numbness.  Hematological: Negative.   Psychiatric/Behavioral: Negative for confusion and agitation. The patient is not nervous/anxious.        Objective:   Physical Exam BP 129/78  Pulse 76  Temp(Src) 98.2 F (36.8 C) (Oral)  Ht 5\' 5"  (1.651 m)  Wt 201 lb (91.173 kg)  BMI 33.45 kg/m2  The patient appeared well nourished and normally developed for her  stated age and her medical issues. She is alert and oriented to time and place. Speech, behavior and judgement appear normal. Vital signs as documented.  Head exam is unremarkable. No scleral icterus or pallor noted. Nasal passages ear canals eardrums and throat and mouth were normal. Neck is without jugular venous distension, thyromegally, or carotid bruits. Carotid upstrokes are brisk bilaterally. No cervical adenopathy. Lungs are clear anteriorly and posteriorly to auscultation. There were only a few crackles in her lungs today, which is much clearer than in the past. Normal respiratory effort. Cardiac exam reveals regular rate and rhythm at 72 per minute. First and second heart sounds normal.  No murmurs, rubs or gallops.  Abdominal exam reveals slight obesity, normal bowl sounds, no masses, no organomegaly and no aortic enlargement. No inguinal adenopathy. Minimal epigastric tenderness. Extremities are nonedematous and both pedal pulses are normal. Skin without pallor or jaundice.  Warm and dry, without rash. There was a metatarsal callus at the base of the right great toe. This was parred down with a #15 blade after sterile preparation of the skin with patient consent. Neurologic exam reveals normal deep tendon reflexes and normal sensation.           Assessment & Plan:  1. Hyperlipemia - NMR, lipoprofile; Standing - Hepatic function panel; Standing - NMR, lipoprofile - Hepatic function panel  2. COPD (chronic obstructive pulmonary disease)  3. Fibromyalgia - BMP8+EGFR; Standing - BMP8+EGFR  4. Vitamin D deficiency - Vitamin D 25 hydroxy; Standing - Vitamin D 25 hydroxy  5. Alpha-1-antitrypsin deficiency -Continued followup by Dr. Jetty Duhamel  6. Fatigue - POCT CBC; Standing - BMP8+EGFR; Standing - Thyroid Panel With TSH - POCT CBC - BMP8+EGFR  7. Dysuria - POCT UA - Microalbumin -  POCT UA - Microscopic Only  8. Callus of foot -Followup with  podiatrist  Patient Instructions  Fall precautions discussed Continue current meds and therapeutic lifestyle changes Pt will schedule mammogram Dexascan scheduled in August May need to see the podiatrist for followup of callus Please be careful and don't get exposed to people that are sick   Nyra Capes MD

## 2013-06-19 NOTE — Patient Instructions (Addendum)
Fall precautions discussed Continue current meds and therapeutic lifestyle changes Pt will schedule mammogram Dexascan scheduled in August May need to see the podiatrist for followup of callus Please be careful and don't get exposed to people that are sick

## 2013-06-21 LAB — HEPATIC FUNCTION PANEL
ALT: 17 IU/L (ref 0–32)
AST: 24 IU/L (ref 0–40)
Total Bilirubin: 0.3 mg/dL (ref 0.0–1.2)

## 2013-06-21 LAB — THYROID PANEL WITH TSH
Free Thyroxine Index: 2.3 (ref 1.2–4.9)
T3 Uptake Ratio: 23 % — ABNORMAL LOW (ref 24–39)

## 2013-06-21 LAB — NMR, LIPOPROFILE
Cholesterol: 173 mg/dL (ref <200)
HDL Particle Number: 36.3 umol/L (ref >=30.5)
LDL Particle Number: 1563 nmol/L — ABNORMAL HIGH (ref <1000)
LDL Size: 20.8 nm (ref >20.5)
Small LDL Particle Number: 656 nmol/L — ABNORMAL HIGH (ref <=527)
Triglycerides by NMR: 178 mg/dL — ABNORMAL HIGH (ref <150)

## 2013-06-21 LAB — BMP8+EGFR
CO2: 27 mmol/L (ref 18–29)
Calcium: 9.9 mg/dL (ref 8.7–10.2)
Potassium: 4.5 mmol/L (ref 3.5–5.2)
Sodium: 141 mmol/L (ref 134–144)

## 2013-06-25 ENCOUNTER — Ambulatory Visit (INDEPENDENT_AMBULATORY_CARE_PROVIDER_SITE_OTHER): Payer: Medicare Other

## 2013-06-25 ENCOUNTER — Ambulatory Visit (INDEPENDENT_AMBULATORY_CARE_PROVIDER_SITE_OTHER): Payer: Medicare Other | Admitting: Pharmacist

## 2013-06-25 VITALS — Ht 65.0 in | Wt 203.5 lb

## 2013-06-25 DIAGNOSIS — E559 Vitamin D deficiency, unspecified: Secondary | ICD-10-CM

## 2013-06-25 DIAGNOSIS — M810 Age-related osteoporosis without current pathological fracture: Secondary | ICD-10-CM

## 2013-06-25 DIAGNOSIS — N951 Menopausal and female climacteric states: Secondary | ICD-10-CM

## 2013-06-25 NOTE — Patient Instructions (Addendum)

## 2013-06-25 NOTE — Progress Notes (Signed)
Patient ID: [name], female   DOB: December 08, 1955, 57 y.o.   MRN: [id number] [facility] Current Height: Height: 5\' 5"  (165.1 cm)      Max Lifetime Height:  5' 5.5" Current Weight: Weight: 203 lb 8 oz (92.307 kg)       Ethnicity:Caucasian  BP:       HR:         HPI: Does pt already have a diagnosis of:   Osteoporosis?  Yes  Back Pain?  Yes  - history of surgery for ruptured disc (spinal fusion and ?discectomy)     Kyphosis?  No Prior fracture?  No Med(s) for Osteoporosis/Osteopenia:  None currently Med(s) previously tried for Osteoporosis/Osteopenia:  actonel and fosamax                                                             PMH: Age at menopause:  Surgical 2005, 57yo Hysterectomy?  Yes Oophorectomy?  Yes HRT? Yes - Former.  Type/duration: premarin for about 6 months Steroid Use?  Yes - Current.  Type/duration: oral steriod for alpha trip. deficiency Thyroid med?  No History of cancer?  Yes - cervical cancer 2005 History of digestive disorders (ie Crohn's)?  No Current or previous eating disorders?  No Last Vitamin D Result:  46.4 (06/19/2013) Last GFR Result:  99 (06/19/2013)   FH/SH: Family history of osteoporosis?  Yes - mother and 3 sisters Parent with history of hip fracture?  No Family history of breast cancer?  No Exercise?  Yes - walking daily Smoking?  No - quite 2013 Alcohol?  No    Calcium Assessment Calcium Intake  # of servings/day  Calcium mg  Milk (8 oz) 1  x  300  = 300mg   Yogurt (4 oz) 0 x  200 = 0  Cheese (1 oz) 1-2 x  200 = 200-400  Other Calcium sources   250mg   Ca supplement 0 = 0   Estimated calcium intake per day 750mg  to 950mg     DEXA Results Date of Test T-Score for AP Spine L1-L4 T-Score for Total Left Hip T-Score for Total Right Hip  06/25/2013 -1.5 -2.0 -1.9  06/22/2010 -1.1 -2.1 -1.6  04/29/2008 -1.1 -1.7 -1.5        ** T-Score for neck of left hip was -2.5 today **  Assessment: Osteoporosis with decreased  BMD  Recommendations: 1.  Looking into possibly starting Prolia but concerned about risk of skin infections and patient's alpha 1 tripysin deficiency.  Will look into further.   2.  continue calcium 1200mg  daily through supplementation or diet.  3.  recommend weight bearing exercise - 30 minutes at least 4 days  per week.   4.  Counseled and educated about fall risk and prevention.  Recheck DEXA:  2 years  Time spent counseling patient:  30 minutes

## 2013-07-02 ENCOUNTER — Other Ambulatory Visit (INDEPENDENT_AMBULATORY_CARE_PROVIDER_SITE_OTHER): Payer: Medicare Other

## 2013-07-02 DIAGNOSIS — N390 Urinary tract infection, site not specified: Secondary | ICD-10-CM

## 2013-07-03 ENCOUNTER — Other Ambulatory Visit: Payer: Self-pay | Admitting: *Deleted

## 2013-07-03 MED ORDER — SULFAMETHOXAZOLE-TMP DS 800-160 MG PO TABS: 1.0000 | ORAL_TABLET | Freq: Two times a day (BID) | ORAL | Status: DC

## 2013-07-04 LAB — URINE CULTURE

## 2013-07-11 ENCOUNTER — Other Ambulatory Visit: Payer: Self-pay | Admitting: Obstetrics & Gynecology

## 2013-07-11 DIAGNOSIS — R87612 Low grade squamous intraepithelial lesion on cytologic smear of cervix (LGSIL): Secondary | ICD-10-CM

## 2013-07-11 DIAGNOSIS — C539 Malignant neoplasm of cervix uteri, unspecified: Secondary | ICD-10-CM

## 2013-07-14 ENCOUNTER — Other Ambulatory Visit (INDEPENDENT_AMBULATORY_CARE_PROVIDER_SITE_OTHER): Payer: Medicare Other

## 2013-07-14 DIAGNOSIS — N390 Urinary tract infection, site not specified: Secondary | ICD-10-CM

## 2013-07-14 LAB — POCT URINALYSIS DIPSTICK
Glucose, UA: NEGATIVE
Ketones, UA: NEGATIVE
Protein, UA: NEGATIVE
Spec Grav, UA: 1.01

## 2013-07-14 LAB — POCT UA - MICROSCOPIC ONLY
Mucus, UA: NEGATIVE
RBC, urine, microscopic: NEGATIVE
WBC, Ur, HPF, POC: NEGATIVE

## 2013-07-17 ENCOUNTER — Ambulatory Visit
Admission: RE | Admit: 2013-07-17 | Discharge: 2013-07-17 | Disposition: A | Discharge status: Home / Self Care | Payer: Medicare Other | Source: Ambulatory Visit | Attending: Obstetrics & Gynecology | Admitting: Obstetrics & Gynecology

## 2013-07-17 DIAGNOSIS — R87612 Low grade squamous intraepithelial lesion on cytologic smear of cervix (LGSIL): Secondary | ICD-10-CM

## 2013-07-17 DIAGNOSIS — C539 Malignant neoplasm of cervix uteri, unspecified: Secondary | ICD-10-CM

## 2013-07-17 MED ORDER — IOHEXOL 300 MG/ML  SOLN
100.0000 mL | Freq: Once | INTRAMUSCULAR | Status: AC | PRN
  Administered 2013-07-17: 100 mL via INTRAVENOUS

## 2013-07-31 ENCOUNTER — Telehealth: Payer: Self-pay | Admitting: Family Medicine

## 2013-07-31 NOTE — Telephone Encounter (Signed)
Pt called

## 2013-08-13 ENCOUNTER — Other Ambulatory Visit: Payer: Self-pay | Admitting: Family Medicine

## 2013-08-20 ENCOUNTER — Other Ambulatory Visit: Payer: Self-pay | Admitting: Family Medicine

## 2013-09-25 ENCOUNTER — Other Ambulatory Visit: Payer: Self-pay

## 2013-10-06 ENCOUNTER — Encounter: Payer: Self-pay | Admitting: Family Medicine

## 2013-10-06 ENCOUNTER — Ambulatory Visit (INDEPENDENT_AMBULATORY_CARE_PROVIDER_SITE_OTHER): Payer: Medicare Other | Admitting: Family Medicine

## 2013-10-06 VITALS — BP 133/77 | HR 85 | Temp 98.7°F | Ht 65.0 in | Wt 198.0 lb

## 2013-10-06 DIAGNOSIS — IMO0001 Reserved for inherently not codable concepts without codable children: Secondary | ICD-10-CM

## 2013-10-06 DIAGNOSIS — J449 Chronic obstructive pulmonary disease, unspecified: Secondary | ICD-10-CM

## 2013-10-06 DIAGNOSIS — E8809 Other disorders of plasma-protein metabolism, not elsewhere classified: Secondary | ICD-10-CM

## 2013-10-06 DIAGNOSIS — L570 Actinic keratosis: Secondary | ICD-10-CM

## 2013-10-06 DIAGNOSIS — E785 Hyperlipidemia, unspecified: Secondary | ICD-10-CM | POA: Insufficient documentation

## 2013-10-06 DIAGNOSIS — Z23 Encounter for immunization: Secondary | ICD-10-CM

## 2013-10-06 DIAGNOSIS — J310 Chronic rhinitis: Secondary | ICD-10-CM

## 2013-10-06 NOTE — Patient Instructions (Addendum)
Continue current treatment and therapeutic lifestyle changes Drink plenty of fluid Continue to use Mucinex twice daily Keep cryotherapy area is cleaned with alcohol Use a cool mist humidifier this winter Always be careful and do not put yourself at risk for falling

## 2013-10-06 NOTE — Progress Notes (Signed)
Subjective:    Patient ID: [name], female    DOB: 08/26/1956, 57 y.o.   MRN: [id number]  HPI  Pt here for follow up and management of chronic medical problems. Patient sees a pulmonologist regularly and has been in her usual respiratory condition recently. She is due to get a flu shot today. She indicates that she stays as active as possible and if she is doing the best she can watching her diet.  Review of Systems  Constitutional: Positive for fatigue. Negative for fever, activity change and appetite change.  HENT: Negative for congestion, ear pain, sinus pressure and sore throat.   Eyes: Positive for itching (slight). Negative for discharge and redness.  Respiratory: Positive for shortness of breath and wheezing. Cough: intermitent.   Gastrointestinal: Negative for abdominal pain, diarrhea, constipation and abdominal distention.  Genitourinary: Negative for dysuria, urgency and frequency.  Musculoskeletal: Positive for myalgias (all over).  Skin: Positive for color change (L arm lesion). Negative for rash and wound.  Allergic/Immunologic: Negative for environmental allergies.  Neurological: Negative for dizziness, tremors, weakness and light-headedness.  Psychiatric/Behavioral: Negative for confusion, sleep disturbance and decreased concentration. The patient is not nervous/anxious.        Objective:   Physical Exam  Nursing note and vitals reviewed. Constitutional: She is oriented to person, place, and time. She appears well-developed and well-nourished. No distress.  HENT:  Head: Normocephalic and atraumatic.  Right Ear: External ear normal.  Left Ear: External ear normal.  Mouth/Throat: Oropharynx is clear and moist. No oropharyngeal exudate.  There is nasal congestion bilateral  Eyes: Conjunctivae and EOM are normal. Pupils are equal, round, and reactive to light. Right eye exhibits no discharge. Left eye exhibits no discharge. No scleral icterus.  Neck: Normal range  of motion. Neck supple. No JVD present. No thyromegaly present.  There no carotid bruits  Cardiovascular: Normal rate, regular rhythm, normal heart sounds and intact distal pulses.  Exam reveals no gallop and no friction rub.   No murmur heard. Pulmonary/Chest: Effort normal. No respiratory distress. She has wheezes. She has rales. She exhibits no tenderness.  She has bilateral basilar rales which is typical for her lungs from the past  Abdominal: Soft. Bowel sounds are normal. She exhibits no mass. There is no tenderness. There is no rebound and no guarding.  Musculoskeletal: Normal range of motion. She exhibits no edema and no tenderness.  Lymphadenopathy:    She has no cervical adenopathy.  Neurological: She is alert and oriented to person, place, and time. She has normal reflexes. No cranial nerve deficit.  Skin: Skin is warm and dry.  There are 2 actinic keratoses left lateral arm  Psychiatric: She has a normal mood and affect. Her behavior is normal. Judgment and thought content normal.  Patient has a positive affect considering her respiratory status    BP 133/77  Pulse 85  Temp(Src) 98.7 F (37.1 C) (Oral)  Ht 5\' 5"  (1.651 m)  Cryotherapy was performed on 2 actinic keratoses on left arm and patient tolerated the procedure well     Assessment & Plan:    1. Hyperlipidemia   2. FIBROMYALGIA   3. Rhinitis, nonallergic   4. CHRONIC OBSTRUCTIVE PULMONARY DISEASE, SEVERE   5. ALPHA 1 ANTITRYPSIN DEFICIENCY    6. Actinic keratoses x2, left arm No orders of the defined types were placed in this encounter.   No orders of the defined types were placed in this encounter.   Patient Instructions  Continue current  treatment and therapeutic lifestyle changes Drink plenty of fluid Continue to use Mucinex twice daily Keep cryotherapy area is cleaned with alcohol Use a cool mist humidifier this winter Always be careful and do not put yourself at risk for falling   Nyra Capes  MD

## 2013-10-09 ENCOUNTER — Other Ambulatory Visit: Payer: Self-pay | Admitting: Family Medicine

## 2013-10-24 ENCOUNTER — Other Ambulatory Visit (INDEPENDENT_AMBULATORY_CARE_PROVIDER_SITE_OTHER): Payer: Medicare Other

## 2013-10-24 DIAGNOSIS — E559 Vitamin D deficiency, unspecified: Secondary | ICD-10-CM

## 2013-10-24 DIAGNOSIS — M797 Fibromyalgia: Secondary | ICD-10-CM

## 2013-10-24 DIAGNOSIS — E785 Hyperlipidemia, unspecified: Secondary | ICD-10-CM

## 2013-10-24 DIAGNOSIS — R5381 Other malaise: Secondary | ICD-10-CM

## 2013-10-24 DIAGNOSIS — R5383 Other fatigue: Secondary | ICD-10-CM

## 2013-10-24 LAB — POCT CBC
HCT, POC: 45.9 % (ref 37.7–47.9)
Hemoglobin: 14.4 g/dL (ref 12.2–16.2)
MCH, POC: 28.5 pg (ref 27–31.2)
MCHC: 31.5 g/dL — AB (ref 31.8–35.4)
POC LYMPH PERCENT: 39.1 %L (ref 10–50)
RDW, POC: 14.4 %
WBC: 7.7 10*3/uL (ref 4.6–10.2)

## 2013-10-24 NOTE — Progress Notes (Signed)
Pt came in for labs only 

## 2013-10-26 LAB — HEPATIC FUNCTION PANEL
ALT: 14 IU/L (ref 0–32)
AST: 20 IU/L (ref 0–40)
Total Bilirubin: <0.2 mg/dL (ref 0.0–1.2)

## 2013-10-26 LAB — NMR, LIPOPROFILE
Cholesterol: 172 mg/dL (ref <200)
HDL Particle Number: 32.5 umol/L (ref >=30.5)
LDL Size: 20.8 nm (ref >20.5)
Small LDL Particle Number: 941 nmol/L — ABNORMAL HIGH (ref <=527)
Triglycerides by NMR: 165 mg/dL — ABNORMAL HIGH (ref <150)

## 2013-10-26 LAB — BMP8+EGFR
GFR calc Af Amer: 121 mL/min/{1.73_m2} (ref >59)
GFR calc non Af Amer: 105 mL/min/{1.73_m2} (ref >59)
Potassium: 4.1 mmol/L (ref 3.5–5.2)
Sodium: 141 mmol/L (ref 134–144)

## 2013-10-26 LAB — VITAMIN D 25 HYDROXY (VIT D DEFICIENCY, FRACTURES): Vit D, 25-Hydroxy: 52.5 ng/mL (ref 30.0–100.0)

## 2013-11-04 ENCOUNTER — Encounter: Payer: Self-pay | Admitting: Internal Medicine

## 2013-11-04 ENCOUNTER — Ambulatory Visit (INDEPENDENT_AMBULATORY_CARE_PROVIDER_SITE_OTHER): Payer: Medicare Other | Admitting: Internal Medicine

## 2013-11-04 VITALS — BP 132/74 | HR 91 | Ht 65.0 in | Wt 199.0 lb

## 2013-11-04 DIAGNOSIS — F17200 Nicotine dependence, unspecified, uncomplicated: Secondary | ICD-10-CM

## 2013-11-04 DIAGNOSIS — J449 Chronic obstructive pulmonary disease, unspecified: Secondary | ICD-10-CM

## 2013-11-04 DIAGNOSIS — E8809 Other disorders of plasma-protein metabolism, not elsewhere classified: Secondary | ICD-10-CM

## 2013-11-04 NOTE — Patient Instructions (Signed)
We can continue present meds  Please call as needed 

## 2013-11-04 NOTE — Progress Notes (Signed)
Patient ID: [name], female    DOB: December 08, 1955, 57 y.o.   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 Last here November 22, 2010. Still smoking 1/3 ppd. We discussed her mood changes with Chantix. She has never tried patches.  Coughing more in hot weather, around strong smells. Short of breath may be a little worse over time, especially if carrying something. Denies chest pain or palpitation. Feet and legs swell some and she notes nocturia x 2-3.   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 Husband is here. She has had flu and pneumonia vaccines. Acute visit-new respiratory infection started about 2 days ago with dry cough, fever to 100 and 101, no chills, frontal headache, nasal congestion. She denies rash, nodes, GI upset or myalgias/arthralgias. She had called our on-call physician yesterday is going to sent Levaquin but her drugstore was already closed. A couple of grandchildren have been recently ill with viral syndromes.   05/02/12- 61 yoF former smoker on replacement for a1AT deficiency, with severe COPD, complicated by OSA and fibromyalgia. Says she quit smoking 04/01/12 !  Here with granddaughter.  Her COPD assessment test(CAT) score today 17/40. Or treatment cleared her bronchitis symptoms in December. In April she got bronchitis again treated by her primary physician with a home nebulizer/albuterol, used twice a day. Chronic productive cough, usually  white. CXR 11/02/11-reviewed with her IMPRESSION:  There is no evidence of acute cardiac or pulmonary process.  Original Report Authenticated By: Brandon Melnick, M.D.  Dr. Christell Constant did a chest x-ray 03/19/2012-COPD with emphysema and bronchitis changes. She says Advair caused edema. She continues alpha replacement Zemaira/ [facility].  11/05/12-  51 yoF former smoker on  replacement for a1AT deficiency, with severe COPD, complicated by OSA and fibromyalgia. FOLLOWS FOR: doing good overall; no more than usual SOB, wheezing ,cough, or congestion    Daughter here Had large local reaction with heat and rash after flu shot last year( Fluzone) so we discussed different brands. She continues on Zemaira IV/ [facility]. replacement therapy for alpha-1 antitrypsin deficiency CXR 03/19/12- recommended follow-up attention to ? Early fibrotic changes   05/06/13-  55 yoF former smoker on replacement for a1AT deficiency, with severe COPD, complicated by OSA and fibromyalgia. Zemaira alpha 1 replacement. FOLLOWS FOR: review PFT results with patient, no more than usual of SOB. She continues her alpha 1 replacement intravenously each week with no problem. Some productive cough with clear sputum. Flonase worked well in the spring for rhinitis. Still noticing dyspnea on exertion especially if carrying a weight. Symbicort helps a lot. PFT- 05/06/2013-reduced FVC may reflect effort but the overall pattern is moderate obstructive airways disease with response to bronchodilator, air trapping, normal diffusion. This doesn't fit entirely with emphysema.  CXR 11/28/12 IMPRESSION:  No acute cardiopulmonary disease. Findings consistent with COPD.  No change from the prior study.  Original Report Authenticated By: Amie Portland, M.D.  11/04/13- 65 yoF former smoker on replacement for a1AT deficiency, with severe COPD, complicated by OSA and fibromyalgia. Zemaira alpha 1 replacement.  Husband here Follows for- 6 month rov.  Pt c/o SOB with exertion, no other complaints.  Zemaira/ [facility] Baseline the congested cough but doing well. Discussed her vaccine status.  ROS-see HPI Constitutional:   No-   weight loss, night sweats, fevers, chills, fatigue, lassitude. HEENT:  No-  headaches, difficulty swallowing, tooth/dental problems, sore throat,       No-  sneezing, itching, ear ache, nasal  congestion, post nasal drip,  CV:  No-   chest pain, orthopnea, PND, swelling in lower extremities, anasarca, dizziness, palpitations Resp: +shortness of breath with exertion or at rest.              +productive cough,  No non-productive cough,  No- coughing up of blood.              + change in color of mucus.  No- wheezing.   Skin: No-   rash or lesions. GI:  No-   heartburn, indigestion, abdominal pain, nausea, vomiting, GU:  MS:  No-   joint pain or swelling.   Neuro-     nothing unusual Psych:  No- change in mood or affect. No depression or anxiety.  No memory loss.   Objective:   Physical Exam General- Alert, Oriented, Affect-appropriate, Distress- none acute, obese Skin- rash-none, lesions- none, excoriation- none Lymphadenopathy- none Head- atraumatic            Eyes- Gross vision intact, PERRLA, conjunctivae clear secretions            Ears- Hearing, canals-normal            Nose- congested, no-Septal dev, mucus, polyps, erosion, perforation             Throat- Mallampati II , mucosa clear , +drainage- white, mucoid, tonsils- atrophic,  Neck- flexible , trachea midline, no stridor , thyroid nl, carotid no bruit Chest - symmetrical excursion , unlabored           Heart/CV- RRR , no murmur , no gallop  , no rub, nl s1 s2                           - JVD- none , edema- none, stasis changes- none, varices- none           Lung- +cough , +bilateral rhonchi, dullness-none, rub- none. Unlabored           Chest wall-  Abd-  Br/ Gen/ Rectal- Not done, not indicated Extrem- cyanosis- none, clubbing, none, atrophy- none, strength- nl Neuro- grossly intact to observation

## 2013-11-17 ENCOUNTER — Other Ambulatory Visit: Payer: Self-pay | Admitting: Family Medicine

## 2013-11-27 NOTE — Assessment & Plan Note (Signed)
Discussed vaccine protection is available. Dr. Laurance Flatten manages her pneumonia vaccine. Plan-we agreed to wait on chest x-ray

## 2013-11-27 NOTE — Assessment & Plan Note (Signed)
Remains off tobacco

## 2013-11-27 NOTE — Assessment & Plan Note (Signed)
Continues compliant successfully

## 2013-12-04 ENCOUNTER — Telehealth: Payer: Self-pay | Admitting: Internal Medicine

## 2013-12-04 NOTE — Telephone Encounter (Signed)
LM for [name] w/ [employer] (P) [phone] to contact our office

## 2013-12-05 NOTE — Telephone Encounter (Signed)
Faxed back; will await a decision from patients insurance company.Marland Kitchen

## 2013-12-05 NOTE — Telephone Encounter (Signed)
PA form received from [employer] for pt. Form completed and placed on CY cart to sign. Fax back # is on form. Shannon Bing, CMA

## 2013-12-05 NOTE — Telephone Encounter (Signed)
Done

## 2013-12-08 ENCOUNTER — Other Ambulatory Visit: Payer: Self-pay | Admitting: Family Medicine

## 2013-12-12 NOTE — Telephone Encounter (Signed)
[name], have you heard anything on the status of this thanks!

## 2013-12-19 NOTE — Telephone Encounter (Signed)
Sorry, the approval letter was sent to scan in [employer]; now in patients chart. Approved until 2016. Thanks.

## 2014-01-08 ENCOUNTER — Ambulatory Visit: Payer: Medicare Other | Admitting: Family Medicine

## 2014-01-14 ENCOUNTER — Telehealth: Payer: Self-pay | Admitting: Family Medicine

## 2014-01-14 NOTE — Telephone Encounter (Signed)
appt for tues 03/3

## 2014-01-15 ENCOUNTER — Ambulatory Visit: Payer: Medicare Other | Admitting: Family Medicine

## 2014-01-19 ENCOUNTER — Ambulatory Visit: Payer: Medicare Other | Admitting: Family Medicine

## 2014-01-20 ENCOUNTER — Encounter: Payer: Self-pay | Admitting: Family Medicine

## 2014-01-20 ENCOUNTER — Ambulatory Visit (INDEPENDENT_AMBULATORY_CARE_PROVIDER_SITE_OTHER): Payer: Medicare Other | Admitting: Family Medicine

## 2014-01-20 VITALS — BP 147/76 | HR 93 | Temp 98.5°F | Ht 65.0 in | Wt 202.0 lb

## 2014-01-20 DIAGNOSIS — R7989 Other specified abnormal findings of blood chemistry: Secondary | ICD-10-CM

## 2014-01-20 DIAGNOSIS — E559 Vitamin D deficiency, unspecified: Secondary | ICD-10-CM

## 2014-01-20 DIAGNOSIS — J449 Chronic obstructive pulmonary disease, unspecified: Secondary | ICD-10-CM

## 2014-01-20 DIAGNOSIS — M810 Age-related osteoporosis without current pathological fracture: Secondary | ICD-10-CM

## 2014-01-20 DIAGNOSIS — E8809 Other disorders of plasma-protein metabolism, not elsewhere classified: Secondary | ICD-10-CM

## 2014-01-20 DIAGNOSIS — E8881 Metabolic syndrome: Secondary | ICD-10-CM

## 2014-01-20 DIAGNOSIS — E785 Hyperlipidemia, unspecified: Secondary | ICD-10-CM

## 2014-01-20 LAB — POCT CBC
GRANULOCYTE PERCENT: 61.5 % (ref 37–80)
HEMATOCRIT: 46.1 % (ref 37.7–47.9)
HEMOGLOBIN: 15 g/dL (ref 12.2–16.2)
Lymph, poc: 2.9 (ref 0.6–3.4)
MCH, POC: 29.6 pg (ref 27–31.2)
MCHC: 32.6 g/dL (ref 31.8–35.4)
MCV: 91 fL (ref 80–97)
MPV: 7.9 fL (ref 0–99.8)
POC GRANULOCYTE: 4.9 (ref 2–6.9)
POC LYMPH %: 36.8 % (ref 10–50)
Platelet Count, POC: 296 10*3/uL (ref 142–424)
RBC: 5.1 M/uL (ref 4.04–5.48)
RDW, POC: 14.1 %
WBC: 7.9 10*3/uL (ref 4.6–10.2)

## 2014-01-20 LAB — POCT GLYCOSYLATED HEMOGLOBIN (HGB A1C): Hemoglobin A1C: 5.5

## 2014-01-20 NOTE — Addendum Note (Signed)
Addended by: Zannie Cove on: 01/20/2014 11:14 AM   Modules accepted: Orders

## 2014-01-20 NOTE — Patient Instructions (Addendum)
Continue current medications. Continue good therapeutic lifestyle changes which include good diet and exercise. Fall precautions discussed with patient. If an FOBT was given today- please return it to our front desk. If you are over 56 years old - you may need Prevnar 58 or the adult Pneumonia vaccine. Continue to use good hand cleansing and avoidance of people who are sick. We will call you with the results of the lab work once those results are available, return the FOBT in April

## 2014-01-20 NOTE — Progress Notes (Signed)
Subjective:    Patient ID: [name], female    DOB: 1956/08/12, 58 y.o.   MRN: [id number]  HPI Pt here for follow up and management of chronic medical problems. The patient is stable. She is also followed by Dr. Baird Lyons. He indicated to her that she did not need a Prevnar vaccine. She had a colonoscopy in April 2000 410 and will be given FOBT to return in one month. She is up-to-date on all of her other health maintenance parameters. She will get her lab work done today. Patient comes to the visit today with her husband. She is seeing spirits and has been feeling well. She asked that a copy of her DEXA scan be sent to a rheumatologist.        Patient Active Problem List   Diagnosis Date Noted  . Hyperlipidemia 10/06/2013  . Osteoporosis 06/25/2013  . Rhinitis, nonallergic 05/08/2012  . TOBACCO USER 11/22/2010  . DYSPNEA 06/24/2008  . SLEEP APNEA 06/02/2008  . ALPHA 1 ANTITRYPSIN DEFICIENCY 12/06/2007  . CHRONIC OBSTRUCTIVE PULMONARY DISEASE, SEVERE 09/14/2007  . FIBROMYALGIA 09/14/2007   Outpatient Encounter Prescriptions as of 01/20/2014  Medication Sig  . albuterol (PROVENTIL HFA;VENTOLIN HFA) 108 (90 BASE) MCG/ACT inhaler Inhale 2 puffs into the lungs every 4 (four) hours as needed for wheezing.  Marland Kitchen albuterol (PROVENTIL) (2.5 MG/3ML) 0.083% nebulizer solution Take 2.5 mg by nebulization every 6 (six) hours as needed.  Marland Kitchen alpha-1-proteinase inhibitor, human, (ZEMAIRA) 1000 MG SOLR Inject 60 mg/kg into the vein once a week.   Marland Kitchen amitriptyline (ELAVIL) 75 MG tablet Take 75 mg by mouth at bedtime.    . CRESTOR 40 MG tablet TAKE (1) TABLET DAILY AS DIRECTED.  Marland Kitchen FLUoxetine (PROZAC) 20 MG capsule Take 20 mg by mouth daily.    . fluticasone (FLONASE) 50 MCG/ACT nasal spray Place 2 sprays into the nose daily.  . furosemide (LASIX) 20 MG tablet 1 daily for occasional use - diuretic  . guaiFENesin (MUCINEX) 600 MG 12 hr tablet Take 1,200 mg by mouth 2 (two) times daily.    Marland Kitchen  HYDROcodone-acetaminophen (NORCO/VICODIN) 5-325 MG per tablet Take 1 tablet by mouth 3 (three) times daily.  Marland Kitchen omeprazole (PRILOSEC) 40 MG capsule TAKE (1) CAPSULE DAILY  . SPIRIVA HANDIHALER 18 MCG inhalation capsule INHALE 1 CAPSULE DAILY VIA NEBULIZER  . SYMBICORT 80-4.5 MCG/ACT inhaler 2 PUFFS 2 TIMES A DAY  . ZETIA 10 MG tablet TAKE 1 TABLET ONCE A DAY  . [DISCONTINUED] rosuvastatin (CRESTOR) 20 MG tablet Take 1 tablet (20 mg total) by mouth daily.    Review of Systems  Constitutional: Negative.   HENT: Negative.   Eyes: Negative.   Respiratory: Negative.   Cardiovascular: Negative.   Gastrointestinal: Negative.   Endocrine: Negative.   Genitourinary: Negative.   Musculoskeletal: Negative.   Skin: Negative.   Allergic/Immunologic: Negative.   Neurological: Negative.   Hematological: Negative.   Psychiatric/Behavioral: Negative.        Objective:   Physical Exam  Nursing note and vitals reviewed. Constitutional: She is oriented to person, place, and time. She appears well-developed and well-nourished. No distress.  HENT:  Head: Normocephalic and atraumatic.  Right Ear: External ear normal.  Left Ear: External ear normal.  Mouth/Throat: Oropharynx is clear and moist.  Nasal congestion and turbinate edema bilateral  Eyes: Conjunctivae and EOM are normal. Pupils are equal, round, and reactive to light. Right eye exhibits no discharge. Left eye exhibits no discharge. No scleral icterus.  Neck: Normal  range of motion. Neck supple. No thyromegaly present.  Cardiovascular: Normal rate, regular rhythm, normal heart sounds and intact distal pulses.  Exam reveals no gallop and no friction rub.   No murmur heard. At 84 per minute  Pulmonary/Chest: Effort normal. No respiratory distress. She has no wheezes. She has rales. She exhibits no tenderness.  Saran wrap breath sounds as usual by bi laterally  Abdominal: Soft. Bowel sounds are normal. She exhibits no mass. There is no  tenderness. There is no rebound and no guarding.  Musculoskeletal: Normal range of motion. She exhibits no edema and no tenderness.  Lymphadenopathy:    She has no cervical adenopathy.  Neurological: She is alert and oriented to person, place, and time. She has normal reflexes. No cranial nerve deficit.  Skin: Skin is warm and dry.  Psychiatric: She has a normal mood and affect. Her behavior is normal. Judgment and thought content normal.   BP 147/76  Pulse 93  Temp(Src) 98.5 F (36.9 C) (Oral)  Ht $R'5\' 5"'UG$  (1.651 m)  Wt 202 lb (91.627 kg)  BMI 33.61 kg/m2        Assessment & Plan:  1. ALPHA 1 ANTITRYPSIN DEFICIENCY - POCT CBC  2. CHRONIC OBSTRUCTIVE PULMONARY DISEASE, SEVERE - POCT CBC  3. Hyperlipidemia - POCT CBC - BMP8+EGFR - Hepatic function panel - NMR, lipoprofile  4. Osteoporosis - POCT CBC -copy of DEXA to Dr. Amil Amen   5. Vitamin D deficiency - Vit D  25 hydroxy (rtn osteoporosis monitoring)  6. Metabolic syndrome  Patient Instructions  Continue current medications. Continue good therapeutic lifestyle changes which include good diet and exercise. Fall precautions discussed with patient. If an FOBT was given today- please return it to our front desk. If you are over 45 years old - you may need Prevnar 37 or the adult Pneumonia vaccine. Continue to use good hand cleansing and avoidance of people who are sick. We will call you with the results of the lab work once those results are available, return the FOBT in April      Arrie Senate MD

## 2014-01-21 LAB — BMP8+EGFR
BUN/Creatinine Ratio: 14 (ref 9–23)
BUN: 10 mg/dL (ref 6–24)
CALCIUM: 9.6 mg/dL (ref 8.7–10.2)
CHLORIDE: 102 mmol/L (ref 97–108)
CO2: 23 mmol/L (ref 18–29)
Creatinine, Ser: 0.74 mg/dL (ref 0.57–1.00)
GFR, EST AFRICAN AMERICAN: 105 mL/min/{1.73_m2} (ref >59)
GFR, EST NON AFRICAN AMERICAN: 91 mL/min/{1.73_m2} (ref >59)
GLUCOSE: 112 mg/dL — AB (ref 65–99)
POTASSIUM: 4.4 mmol/L (ref 3.5–5.2)
Sodium: 143 mmol/L (ref 134–144)

## 2014-01-21 LAB — NMR, LIPOPROFILE
CHOLESTEROL: 192 mg/dL (ref <200)
HDL Cholesterol by NMR: 51 mg/dL (ref >=40)
HDL PARTICLE NUMBER: 36.4 umol/L (ref >=30.5)
LDL PARTICLE NUMBER: 1472 nmol/L — AB (ref <1000)
LDL Size: 21.1 nm (ref >20.5)
LDLC SERPL CALC-MCNC: 104 mg/dL — AB (ref <100)
LP-IR Score: 60 — ABNORMAL HIGH (ref <=45)
Small LDL Particle Number: 725 nmol/L — ABNORMAL HIGH (ref <=527)
TRIGLYCERIDES BY NMR: 183 mg/dL — AB (ref <150)

## 2014-01-21 LAB — HEPATIC FUNCTION PANEL
ALBUMIN: 4.5 g/dL (ref 3.5–5.5)
ALK PHOS: 141 IU/L — AB (ref 39–117)
ALT: 24 IU/L (ref 0–32)
AST: 30 IU/L (ref 0–40)
Bilirubin, Direct: 0.08 mg/dL (ref 0.00–0.40)
TOTAL PROTEIN: 7.8 g/dL (ref 6.0–8.5)
Total Bilirubin: 0.3 mg/dL (ref 0.0–1.2)

## 2014-01-21 LAB — VITAMIN D 25 HYDROXY (VIT D DEFICIENCY, FRACTURES): Vit D, 25-Hydroxy: 47.8 ng/mL (ref 30.0–100.0)

## 2014-02-06 ENCOUNTER — Other Ambulatory Visit: Payer: Self-pay | Admitting: Family Medicine

## 2014-02-07 ENCOUNTER — Other Ambulatory Visit: Payer: Self-pay | Admitting: Family Medicine

## 2014-02-18 ENCOUNTER — Other Ambulatory Visit: Payer: Self-pay | Admitting: Family Medicine

## 2014-05-05 ENCOUNTER — Encounter: Payer: Self-pay | Admitting: Internal Medicine

## 2014-05-05 ENCOUNTER — Ambulatory Visit (INDEPENDENT_AMBULATORY_CARE_PROVIDER_SITE_OTHER)
Admission: RE | Admit: 2014-05-05 | Discharge: 2014-05-05 | Disposition: A | Discharge status: Home / Self Care | Payer: Medicare Other | Source: Ambulatory Visit | Attending: Internal Medicine | Admitting: Internal Medicine

## 2014-05-05 ENCOUNTER — Ambulatory Visit (INDEPENDENT_AMBULATORY_CARE_PROVIDER_SITE_OTHER): Payer: Medicare Other | Admitting: Internal Medicine

## 2014-05-05 VITALS — BP 120/62 | HR 88 | Ht 65.0 in | Wt 200.6 lb

## 2014-05-05 DIAGNOSIS — J449 Chronic obstructive pulmonary disease, unspecified: Secondary | ICD-10-CM

## 2014-05-05 DIAGNOSIS — F17200 Nicotine dependence, unspecified, uncomplicated: Secondary | ICD-10-CM

## 2014-05-05 DIAGNOSIS — E8801 Alpha-1-antitrypsin deficiency: Secondary | ICD-10-CM

## 2014-05-05 DIAGNOSIS — J4489 Other specified chronic obstructive pulmonary disease: Secondary | ICD-10-CM

## 2014-05-05 DIAGNOSIS — E8809 Other disorders of plasma-protein metabolism, not elsewhere classified: Secondary | ICD-10-CM

## 2014-05-05 MED ORDER — COMPRESSOR/NEBULIZER MISC: Status: DC

## 2014-05-05 MED ORDER — FLUCONAZOLE 150 MG PO TABS: 150.0000 mg | ORAL_TABLET | Freq: Every day | ORAL | Status: DC

## 2014-05-05 NOTE — Patient Instructions (Addendum)
Script for nebulizer compressor  Script sent for diflucan to clear yeast from mouth  Order CXR  Dx COPD, alpha 1 antitrypsin deficiency

## 2014-05-05 NOTE — Progress Notes (Signed)
Patient ID: [name], female    DOB: December 08, 1955, 58 y.o.   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 Last here November 22, 2010. Still smoking 1/3 ppd. We discussed her mood changes with Chantix. She has never tried patches.  Coughing more in hot weather, around strong smells. Short of breath may be a little worse over time, especially if carrying something. Denies chest pain or palpitation. Feet and legs swell some and she notes nocturia x 2-3.   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 Husband is here. She has had flu and pneumonia vaccines. Acute visit-new respiratory infection started about 2 days ago with dry cough, fever to 100 and 101, no chills, frontal headache, nasal congestion. She denies rash, nodes, GI upset or myalgias/arthralgias. She had called our on-call physician yesterday is going to sent Levaquin but her drugstore was already closed. A couple of grandchildren have been recently ill with viral syndromes.   05/02/12- 58 yoF former smoker on replacement for a1AT deficiency, with severe COPD, complicated by OSA and fibromyalgia. Says she quit smoking 04/01/12 !  Here with granddaughter.  Her COPD assessment test(CAT) score today 17/40. Or treatment cleared her bronchitis symptoms in December. In April she got bronchitis again treated by her primary physician with a home nebulizer/albuterol, used twice a day. Chronic productive cough, usually  white. CXR 11/02/11-reviewed with her IMPRESSION:  There is no evidence of acute cardiac or pulmonary process.  Original Report Authenticated By: Duayne Cal, M.D.  Dr. Laurance Flatten did a chest x-ray 03/19/2012-COPD with emphysema and bronchitis changes. She says Advair caused edema. She continues alpha replacement Zemaira/ [facility].  11/05/12-  58 yoF former smoker on  replacement for a1AT deficiency, with severe COPD, complicated by OSA and fibromyalgia. FOLLOWS FOR: doing good overall; no more than usual SOB, wheezing ,cough, or congestion    Daughter here Had large local reaction with heat and rash after flu shot last year( Fluzone) so we discussed different brands. She continues on Zemaira IV/ [facility]. replacement therapy for alpha-1 antitrypsin deficiency CXR 03/19/12- recommended follow-up attention to ? Early fibrotic changes   05/06/13-  55 yoF former smoker on replacement for a1AT deficiency, with severe COPD, complicated by OSA and fibromyalgia. Zemaira alpha 1 replacement. FOLLOWS FOR: review PFT results with patient, no more than usual of SOB. She continues her alpha 1 replacement intravenously each week with no problem. Some productive cough with clear sputum. Flonase worked well in the spring for rhinitis. Still noticing dyspnea on exertion especially if carrying a weight. Symbicort helps a lot. PFT- 05/06/2013-reduced FVC may reflect effort but the overall pattern is moderate obstructive airways disease with response to bronchodilator, air trapping, normal diffusion. This doesn't fit entirely with emphysema.  CXR 11/28/12 IMPRESSION:  No acute cardiopulmonary disease. Findings consistent with COPD.  No change from the prior study.  Original Report Authenticated By: Lajean Manes, M.D.  11/04/13- 58 yoF former smoker on replacement for a1AT deficiency, with severe COPD, complicated by OSA and fibromyalgia. Zemaira alpha 1 replacement.  Husband here Follows for- 6 month rov.  Pt c/o SOB with exertion, no other complaints.  Z[facility]a/ [facility] Baseline the congested cough but doing well. Discussed her vaccine status.  05/05/14- 58 yoF former smoker on replacement for a1AT deficiency, with severe COPD, complicated by OSA and fibromyalgia. Zemaira  alpha 1 replacement.  Husband here FOLLOWS [id number] flare ups-staying out of the heat.  She is avoiding  the summer heat by staying in air conditioned areas. No acute events. Cough is usually productive. Says her nebulizer machine is worn out and asks if we can replace it  ROS-see HPI Constitutional:   No-   weight loss, night sweats, fevers, chills, fatigue, lassitude. HEENT:   No-  headaches, difficulty swallowing, tooth/dental problems, sore throat,       No-  sneezing, itching, ear ache, nasal congestion, post nasal drip,  CV:  No-   chest pain, orthopnea, PND, swelling in lower extremities, anasarca, dizziness, palpitations Resp: +shortness of breath with exertion or at rest.              +productive cough,  No non-productive cough,  No- coughing up of blood.              + change in color of mucus.  No- wheezing.   Skin: No-   rash or lesions. GI:  No-   heartburn, indigestion, abdominal pain, nausea, vomiting, GU:  MS:  No-   joint pain or swelling.   Neuro-     nothing unusual Psych:  No- change in mood or affect. No depression or anxiety.  No memory loss.   Objective:   Physical Exam General- Alert, Oriented, Affect-appropriate, Distress- none acute, obese Skin- rash-none, lesions- none, excoriation- none Lymphadenopathy- none Head- atraumatic            Eyes- Gross vision intact, PERRLA, conjunctivae clear secretions            Ears- Hearing, canals-normal            Nose- congested, no-Septal dev, mucus, polyps, erosion, perforation             Throat- Mallampati II , mucosa+tongue coated , +drainage- white, mucoid,                              tonsils- atrophic, +dentures Neck- flexible , trachea midline, no stridor , thyroid nl, carotid no bruit Chest - symmetrical excursion , unlabored           Heart/CV- RRR , no murmur , no gallop  , no rub, nl s1 s2                           - JVD- none , edema- none, stasis changes- none, varices- none           Lung- +cough , +bilateral rhonchi, dullness-none, rub- none. Unlabored           Chest wall-  Abd-  Br/ Gen/ Rectal- Not  done, not indicated Extrem- cyanosis- none, clubbing, none, atrophy- none, strength- nl Neuro- grossly intact to observation

## 2014-05-05 NOTE — Progress Notes (Signed)
Quick Note:  lmtcb X1 ______ 

## 2014-05-07 ENCOUNTER — Other Ambulatory Visit: Payer: Self-pay | Admitting: Family Medicine

## 2014-05-11 ENCOUNTER — Other Ambulatory Visit: Payer: Self-pay | Admitting: Family Medicine

## 2014-05-26 ENCOUNTER — Encounter: Payer: Self-pay | Admitting: Family Medicine

## 2014-05-26 ENCOUNTER — Ambulatory Visit (INDEPENDENT_AMBULATORY_CARE_PROVIDER_SITE_OTHER): Payer: Medicare Other | Admitting: Family Medicine

## 2014-05-26 VITALS — BP 124/70 | HR 78 | Temp 97.2°F | Ht 65.0 in | Wt 196.8 lb

## 2014-05-26 DIAGNOSIS — M797 Fibromyalgia: Secondary | ICD-10-CM

## 2014-05-26 DIAGNOSIS — E559 Vitamin D deficiency, unspecified: Secondary | ICD-10-CM

## 2014-05-26 DIAGNOSIS — J449 Chronic obstructive pulmonary disease, unspecified: Secondary | ICD-10-CM

## 2014-05-26 DIAGNOSIS — R5381 Other malaise: Secondary | ICD-10-CM

## 2014-05-26 DIAGNOSIS — IMO0001 Reserved for inherently not codable concepts without codable children: Secondary | ICD-10-CM

## 2014-05-26 DIAGNOSIS — R5383 Other fatigue: Secondary | ICD-10-CM

## 2014-05-26 DIAGNOSIS — J310 Chronic rhinitis: Secondary | ICD-10-CM

## 2014-05-26 DIAGNOSIS — E785 Hyperlipidemia, unspecified: Secondary | ICD-10-CM

## 2014-05-26 DIAGNOSIS — E8809 Other disorders of plasma-protein metabolism, not elsewhere classified: Secondary | ICD-10-CM

## 2014-05-26 LAB — POCT CBC
GRANULOCYTE PERCENT: 55.2 % (ref 37–80)
HCT, POC: 44.2 % (ref 37.7–47.9)
Hemoglobin: 14.5 g/dL (ref 12.2–16.2)
LYMPH, POC: 3.2 (ref 0.6–3.4)
MCH, POC: 29.5 pg (ref 27–31.2)
MCHC: 32.8 g/dL (ref 31.8–35.4)
MCV: 89.7 fL (ref 80–97)
MPV: 7.5 fL (ref 0–99.8)
PLATELET COUNT, POC: 248 10*3/uL (ref 142–424)
POC GRANULOCYTE: 4.3 (ref 2–6.9)
POC LYMPH %: 41.6 % (ref 10–50)
RBC: 4.9 M/uL (ref 4.04–5.48)
RDW, POC: 14.6 %
WBC: 7.7 10*3/uL (ref 4.6–10.2)

## 2014-05-26 NOTE — Patient Instructions (Addendum)
Continue current medications. Continue good therapeutic lifestyle changes which include good diet and exercise. Fall precautions discussed with patient. If an FOBT was given today- please return it to our front desk. If you are over 56 years old - you may need Prevnar 14 or the adult Pneumonia vaccine.  Continue followup visits with Dr. Annamaria Boots Keep appointment with gynecology as planned  Get eye exam as planned  Stay active physically and drink plenty of fluids You can try Claritin over-the-counter as needed for nasal congestion

## 2014-05-26 NOTE — Progress Notes (Signed)
Subjective:    Patient ID: [name], female    DOB: 1956/07/30, 58 y.o.   MRN: [id number]  HPI Pt is here today for follow up of chronic medical problems to include metabolic syndrome, hyperlipidemia, COPD, osteoporosis and fibromyalgia. The patient complains of fatigue, arthralgias, shortness of breath, some environmental allergies, and occasional headaches. She indicates shortness of breath is no worse than usual. She does see the pulmonologist regularly for her alpha 1 antitrypsin deficiency. She comes visit today with her husband. Another appointment with noting is that Dr. Annamaria Boots, her pulmonologist did not recommend getting the Prevnar vaccine. She is currently up-to-date on her pelvic exams, mammogram, DEXA scan and chest x-rays. She had a chest x-ray by Dr. Annamaria Boots in June of this year.       Patient Active Problem List   Diagnosis Date Noted  . Hyperlipidemia 10/06/2013  . Osteoporosis 06/25/2013  . Rhinitis, nonallergic 05/08/2012  . TOBACCO USER 11/22/2010  . DYSPNEA 06/24/2008  . SLEEP APNEA 06/02/2008  . ALPHA 1 ANTITRYPSIN DEFICIENCY 12/06/2007  . CHRONIC OBSTRUCTIVE PULMONARY DISEASE, SEVERE 09/14/2007  . FIBROMYALGIA 09/14/2007   Outpatient Encounter Prescriptions as of 05/26/2014  Medication Sig  . albuterol (PROVENTIL) (2.5 MG/3ML) 0.083% nebulizer solution Take 2.5 mg by nebulization every 6 (six) hours as needed.  Marland Kitchen alpha-1-proteinase inhibitor, human, (ZEMAIRA) 1000 MG SOLR Inject 60 mg/kg into the vein once a week.   Marland Kitchen amitriptyline (ELAVIL) 75 MG tablet Take 75 mg by mouth at bedtime.    . CRESTOR 40 MG tablet TAKE (1) TABLET DAILY AS DIRECTED.  Marland Kitchen FLUoxetine (PROZAC) 20 MG capsule Take 20 mg by mouth daily.    . fluticasone (FLONASE) 50 MCG/ACT nasal spray USE 2 SPRAYS IN EACH NOSTRIL ONCE DAILY.  . furosemide (LASIX) 20 MG tablet 1 daily for occasional use - diuretic  . guaiFENesin (MUCINEX) 600 MG 12 hr tablet Take 1,200 mg by mouth 2 (two) times daily.      Marland Kitchen HYDROcodone-acetaminophen (NORCO/VICODIN) 5-325 MG per tablet Take 1 tablet by mouth 3 (three) times daily.  . Nebulizers (COMPRESSOR/NEBULIZER) MISC Use as directed  . omeprazole (PRILOSEC) 40 MG capsule TAKE (1) CAPSULE DAILY  . PROAIR HFA 108 (90 BASE) MCG/ACT inhaler 2 PUFFS EVERY 4 HOURS AS NEEDED FOR WHEEZING  . SPIRIVA HANDIHALER 18 MCG inhalation capsule INHALE 1 CAPSULE DAILY VIA NEBULIZER  . SYMBICORT 80-4.5 MCG/ACT inhaler 2 PUFFS 2 TIMES A DAY  . ZETIA 10 MG tablet TAKE 1 TABLET ONCE A DAY  . [DISCONTINUED] fluconazole (DIFLUCAN) 150 MG tablet Take 1 tablet (150 mg total) by mouth daily.     Review of Systems  Constitutional: Positive for fatigue (slight). Negative for activity change.  Eyes: Negative.   Respiratory: Positive for shortness of breath (stable).   Endocrine: Negative.   Genitourinary: Negative for dysuria and frequency.  Musculoskeletal: Positive for arthralgias and myalgias (slight worsening).  Allergic/Immunologic: Positive for environmental allergies.  Neurological: Positive for headaches (intermitent).  Psychiatric/Behavioral: Negative.        Objective:   Physical Exam  Nursing note and vitals reviewed. Constitutional: She is oriented to person, place, and time. She appears well-developed and well-nourished. No distress.  HENT:  Head: Normocephalic and atraumatic.  Right Ear: External ear normal.  Left Ear: External ear normal.  Mouth/Throat: Oropharynx is clear and moist.  Nasal turbinate congestion bilaterally  Eyes: Conjunctivae and EOM are normal. Pupils are equal, round, and reactive to light. Right eye exhibits no discharge. Left eye exhibits  no discharge. No scleral icterus.  Neck: Normal range of motion. Neck supple. No thyromegaly present.  No carotid bruits or adenopathy  Cardiovascular: Normal rate, regular rhythm, normal heart sounds and intact distal pulses.  Exam reveals no gallop and no friction rub.   No murmur heard. 72 per  min  Pulmonary/Chest: Effort normal. No respiratory distress. She has no wheezes. She has no rales. She exhibits no tenderness.  This patient has crackles bilaterally both anteriorly and posteriorly and this is consistent with past evaluations because of her lung disease  Abdominal: Soft. Bowel sounds are normal. She exhibits no mass. There is tenderness (slight epigastric tenderness). There is no rebound and no guarding.  Musculoskeletal: Normal range of motion. She exhibits no edema and no tenderness.  Lymphadenopathy:    She has no cervical adenopathy.  Neurological: She is alert and oriented to person, place, and time. She has normal reflexes.  Skin: Skin is warm and dry. No rash noted.  Psychiatric: She has a normal mood and affect. Her behavior is normal. Judgment and thought content normal.   BP 124/70  Pulse 78  Temp(Src) 97.2 F (36.2 C) (Oral)  Ht _0  (1.651 m)  Wt 196 lb 12.8 oz (89.268 kg)  BMI 32.75 kg/m2        Assessment & Plan:  1. ALPHA 1 ANTITRYPSIN DEFICIENCY -Continue followup with pulmonologist  2. CHRONIC OBSTRUCTIVE PULMONARY DISEASE, SEVERE  3. FIBROMYALGIA  4. Rhinitis, nonallergic  5. Hyperlipidemia  6. Fatigue - POCT CBC - BMP8+EGFR  7. Fibromyalgia - BMP8+EGFR  8. Hyperlipemia - NMR, lipoprofile - Hepatic function panel  9. Vitamin D deficiency - Vitamin D 25 hydroxy  No orders of the defined types were placed in this encounter.   Patient Instructions  Continue current medications. Continue good therapeutic lifestyle changes which include good diet and exercise. Fall precautions discussed with patient. If an FOBT was given today- please return it to our front desk. If you are over 63 years old - you may need Prevnar 51 or the adult Pneumonia vaccine.  Continue followup visits with Dr. Annamaria Boots Keep appointment with gynecology as planned  Get eye exam as planned  Stay active physically and drink plenty of fluids You can try  Claritin over-the-counter as needed for nasal congestion     Arrie Senate MD

## 2014-05-27 LAB — BMP8+EGFR
BUN / CREAT RATIO: 12 (ref 9–23)
BUN: 9 mg/dL (ref 6–24)
CALCIUM: 9.3 mg/dL (ref 8.7–10.2)
CHLORIDE: 100 mmol/L (ref 97–108)
CO2: 27 mmol/L (ref 18–29)
CREATININE: 0.76 mg/dL (ref 0.57–1.00)
GFR calc Af Amer: 101 mL/min/{1.73_m2} (ref >59)
GFR calc non Af Amer: 88 mL/min/{1.73_m2} (ref >59)
GLUCOSE: 105 mg/dL — AB (ref 65–99)
Potassium: 4.2 mmol/L (ref 3.5–5.2)
Sodium: 139 mmol/L (ref 134–144)

## 2014-05-27 LAB — NMR, LIPOPROFILE
Cholesterol: 168 mg/dL (ref 100–199)
HDL Cholesterol by NMR: 47 mg/dL (ref >39)
HDL PARTICLE NUMBER: 34.9 umol/L (ref >=30.5)
LDL Particle Number: 1294 nmol/L — ABNORMAL HIGH (ref <1000)
LDL Size: 21 nm (ref >20.5)
LDLC SERPL CALC-MCNC: 89 mg/dL (ref 0–99)
LP-IR SCORE: 61 — AB (ref <=45)
Small LDL Particle Number: 667 nmol/L — ABNORMAL HIGH (ref <=527)
Triglycerides by NMR: 158 mg/dL — ABNORMAL HIGH (ref 0–149)

## 2014-05-27 LAB — HEPATIC FUNCTION PANEL
ALBUMIN: 4.5 g/dL (ref 3.5–5.5)
ALT: 17 IU/L (ref 0–32)
AST: 22 IU/L (ref 0–40)
Alkaline Phosphatase: 133 IU/L — ABNORMAL HIGH (ref 39–117)
BILIRUBIN DIRECT: 0.06 mg/dL (ref 0.00–0.40)
TOTAL PROTEIN: 7.3 g/dL (ref 6.0–8.5)
Total Bilirubin: 0.2 mg/dL (ref 0.0–1.2)

## 2014-05-27 LAB — VITAMIN D 25 HYDROXY (VIT D DEFICIENCY, FRACTURES): VIT D 25 HYDROXY: 44 ng/mL (ref 30.0–100.0)

## 2014-06-08 ENCOUNTER — Other Ambulatory Visit: Payer: Self-pay | Admitting: Family Medicine

## 2014-06-15 ENCOUNTER — Other Ambulatory Visit: Payer: Self-pay | Admitting: Family Medicine

## 2014-07-12 NOTE — Assessment & Plan Note (Signed)
Plan-chest x-ray, replacement for her nebulizer machine, may need AeroChamber spacer

## 2014-07-12 NOTE — Assessment & Plan Note (Signed)
She continues long-term replacement therapy without problems

## 2014-07-12 NOTE — Assessment & Plan Note (Signed)
-  she has been able to stay off

## 2014-08-06 ENCOUNTER — Other Ambulatory Visit: Payer: Self-pay | Admitting: Family Medicine

## 2014-08-08 ENCOUNTER — Other Ambulatory Visit: Payer: Self-pay | Admitting: Family Medicine

## 2014-09-28 ENCOUNTER — Ambulatory Visit (INDEPENDENT_AMBULATORY_CARE_PROVIDER_SITE_OTHER): Payer: Medicare Other | Admitting: Family Medicine

## 2014-09-28 ENCOUNTER — Encounter: Payer: Self-pay | Admitting: Family Medicine

## 2014-09-28 VITALS — BP 134/82 | HR 97 | Temp 97.8°F | Ht 65.0 in | Wt 192.0 lb

## 2014-09-28 DIAGNOSIS — E8801 Alpha-1-antitrypsin deficiency: Secondary | ICD-10-CM

## 2014-09-28 DIAGNOSIS — E8881 Metabolic syndrome: Secondary | ICD-10-CM

## 2014-09-28 DIAGNOSIS — E785 Hyperlipidemia, unspecified: Secondary | ICD-10-CM

## 2014-09-28 DIAGNOSIS — R87629 Unspecified abnormal cytological findings in specimens from vagina: Secondary | ICD-10-CM

## 2014-09-28 DIAGNOSIS — Z8541 Personal history of malignant neoplasm of cervix uteri: Secondary | ICD-10-CM

## 2014-09-28 DIAGNOSIS — M797 Fibromyalgia: Secondary | ICD-10-CM

## 2014-09-28 DIAGNOSIS — Z23 Encounter for immunization: Secondary | ICD-10-CM

## 2014-09-28 DIAGNOSIS — J449 Chronic obstructive pulmonary disease, unspecified: Secondary | ICD-10-CM

## 2014-09-28 DIAGNOSIS — E559 Vitamin D deficiency, unspecified: Secondary | ICD-10-CM

## 2014-09-28 DIAGNOSIS — R896 Abnormal cytological findings in specimens from other organs, systems and tissues: Secondary | ICD-10-CM

## 2014-09-28 DIAGNOSIS — J418 Mixed simple and mucopurulent chronic bronchitis: Secondary | ICD-10-CM

## 2014-09-28 LAB — POCT CBC
GRANULOCYTE PERCENT: 67.3 % (ref 37–80)
HCT, POC: 47.1 % (ref 37.7–47.9)
Hemoglobin: 15 g/dL (ref 12.2–16.2)
Lymph, poc: 3 (ref 0.6–3.4)
MCH: 28.4 pg (ref 27–31.2)
MCHC: 31.9 g/dL (ref 31.8–35.4)
MCV: 89.2 fL (ref 80–97)
MPV: 7.9 fL (ref 0–99.8)
PLATELET COUNT, POC: 295 10*3/uL (ref 142–424)
POC Granulocyte: 6.4 (ref 2–6.9)
POC LYMPH %: 31.2 % (ref 10–50)
RBC: 5.3 M/uL (ref 4.04–5.48)
RDW, POC: 15.8 %
WBC: 9.5 10*3/uL (ref 4.6–10.2)

## 2014-09-28 LAB — POCT GLYCOSYLATED HEMOGLOBIN (HGB A1C): Hemoglobin A1C: 5.7

## 2014-09-28 NOTE — Patient Instructions (Addendum)
Continue current medications. Continue good therapeutic lifestyle changes which include good diet and exercise. Fall precautions discussed with patient. If an FOBT was given today- please return it to our front desk. If you are over 56 years old - you may need Prevnar 35 or the adult Pneumonia vaccine.  Flu Shots will be available at our office starting mid- September. Please call and schedule a FLU CLINIC APPOINTMENT.   Continue follow-up with gynecology and pulmonology. Return the FOBT As discussed, continue to drink plenty of fluids this winter and keep the house cooler than usual so your airways do not get too dried out.

## 2014-09-28 NOTE — Progress Notes (Signed)
Subjective:    Patient ID: [name], female    DOB: 02-27-1956, 58 y.o.   MRN: [id number]  HPI  Pt here for management of chronic medical problems.this patient sees Dr. Annamaria Boots regularly. She is followed by him because for alpha 1 antitrypsin deficiency. He does not want her to get a Prevnar vaccine according to the patient at this point in time.generally the patient is doing well with no specific complaints. She will get a flu shot today. The patient comes to the visit today with her husband. She has had an abnormal Pap smear and is returning to the gynecologist for further follow-up with this. She does have a history of cervical cancer in the past and has had a hysterectomy. She will get lab work today and will be given an FOBT to return.        Patient Active Problem List   Diagnosis Date Noted  . Hyperlipidemia 10/06/2013  . Osteoporosis 06/25/2013  . Rhinitis, nonallergic 05/08/2012  . TOBACCO USER 11/22/2010  . DYSPNEA 06/24/2008  . SLEEP APNEA 06/02/2008  . ALPHA 1 ANTITRYPSIN DEFICIENCY 12/06/2007  . CHRONIC OBSTRUCTIVE PULMONARY DISEASE, SEVERE 09/14/2007  . FIBROMYALGIA 09/14/2007   Outpatient Encounter Prescriptions as of 09/28/2014  Medication Sig  . albuterol (PROVENTIL) (2.5 MG/3ML) 0.083% nebulizer solution Take 2.5 mg by nebulization every 6 (six) hours as needed.  Marland Kitchen alpha-1-proteinase inhibitor, human, (ZEMAIRA) 1000 MG SOLR Inject 60 mg/kg into the vein once a week.   Marland Kitchen amitriptyline (ELAVIL) 75 MG tablet Take 75 mg by mouth at bedtime.    . CRESTOR 40 MG tablet TAKE (1) TABLET DAILY AS DIRECTED.  Marland Kitchen FLUoxetine (PROZAC) 20 MG capsule Take 20 mg by mouth daily.    . fluticasone (FLONASE) 50 MCG/ACT nasal spray USE 2 SPRAYS IN EACH NOSTRIL ONCE DAILY.  Marland Kitchen guaiFENesin (MUCINEX) 600 MG 12 hr tablet Take 1,200 mg by mouth 2 (two) times daily.    Marland Kitchen HYDROcodone-acetaminophen (NORCO/VICODIN) 5-325 MG per tablet Take 1 tablet by mouth 3 (three) times daily.  .  Nebulizers (COMPRESSOR/NEBULIZER) MISC Use as directed  . omeprazole (PRILOSEC) 40 MG capsule TAKE (1) CAPSULE DAILY  . PROAIR HFA 108 (90 BASE) MCG/ACT inhaler 2 PUFFS EVERY 4 HOURS AS NEEDED FOR WHEEZING  . SPIRIVA HANDIHALER 18 MCG inhalation capsule INHALE 1 CAPSULE DAILY  . SYMBICORT 80-4.5 MCG/ACT inhaler 2 PUFFS 2 TIMES A DAY  . ZETIA 10 MG tablet TAKE 1 TABLET ONCE A DAY  . furosemide (LASIX) 20 MG tablet 1 daily for occasional use - diuretic    Review of Systems  Constitutional: Negative.   HENT: Negative.   Eyes: Negative.   Respiratory: Negative.   Cardiovascular: Negative.   Gastrointestinal: Negative.   Endocrine: Negative.   Genitourinary: Negative.   Musculoskeletal: Negative.   Skin: Negative.   Allergic/Immunologic: Negative.   Neurological: Negative.   Hematological: Negative.   Psychiatric/Behavioral: Negative.        Objective:   Physical Exam  Constitutional: She is oriented to person, place, and time. She appears well-developed and well-nourished.  HENT:  Head: Normocephalic and atraumatic.  Right Ear: External ear normal.  Left Ear: External ear normal.  Mouth/Throat: Oropharynx is clear and moist. No oropharyngeal exudate.  Nasal congestion bilaterally  Eyes: Conjunctivae and EOM are normal. Pupils are equal, round, and reactive to light. Right eye exhibits no discharge. Left eye exhibits no discharge. No scleral icterus.  Neck: Normal range of motion. Neck supple. No thyromegaly present.  No  carotid bruits or anterior cervical adenopathy  Cardiovascular: Normal rate, regular rhythm, normal heart sounds and intact distal pulses.  Exam reveals no gallop.   No murmur heard. The rhythm is regular at 72/m without murmurs or gallops  Pulmonary/Chest: Effort normal. No respiratory distress. She has no wheezes. She has no rales. She exhibits no tenderness.  The lungs are fairly clear anteriorly and posteriorly. She still has crackles in both lung bases  posteriorly which is consistent with past observation.  Abdominal: Soft. Bowel sounds are normal. She exhibits no mass. There is tenderness. There is no rebound and no guarding.  Slight) tenderness no inguinal adenopathy  Musculoskeletal: Normal range of motion. She exhibits no edema or tenderness.  Lymphadenopathy:    She has no cervical adenopathy.  Neurological: She is alert and oriented to person, place, and time. She has normal reflexes. No cranial nerve deficit.  Skin: Skin is warm and dry. No rash noted.  Psychiatric: She has a normal mood and affect. Her behavior is normal. Judgment and thought content normal.  The patient is intelligent and has a positive affect regarding the problems that she is dealing with  Nursing note and vitals reviewed.  BP 134/82 mmHg  Pulse 97  Temp(Src) 97.8 F (36.6 C) (Oral)  Ht $R'5\' 5"'qV$  (1.651 m)  Wt 192 lb (87.091 kg)  BMI 31.95 kg/m2        Assessment & Plan:  1. Hyperlipidemia - POCT CBC - BMP8+EGFR - Hepatic function panel - NMR, lipoprofile  2. Vitamin D deficiency - POCT CBC - Vit D  25 hydroxy (rtn osteoporosis monitoring)  3. Chronic obstructive pulmonary disease, unspecified COPD, unspecified chronic bronchitis type - POCT CBC  4. Metabolic syndrome - POCT CBC - POCT glycosylated hemoglobin (Hb A1C) - BMP8+EGFR  5. Alpha-1-antitrypsin deficiency  6. Mixed simple and mucopurulent chronic bronchitis  7. Fibromyalgia  8. Abnormal Pap smear of vagina -continue follow-up with gynecology  9. History of cervical cancer  Patient Instructions  Continue current medications. Continue good therapeutic lifestyle changes which include good diet and exercise. Fall precautions discussed with patient. If an FOBT was given today- please return it to our front desk. If you are over 44 years old - you may need Prevnar 51 or the adult Pneumonia vaccine.  Flu Shots will be available at our office starting mid- September. Please call  and schedule a FLU CLINIC APPOINTMENT.   Continue follow-up with gynecology and pulmonology. Return the FOBT As discussed, continue to drink plenty of fluids this winter and keep the house cooler than usual so your airways do not get too dried out.   Arrie Senate MD

## 2014-09-29 ENCOUNTER — Telehealth: Payer: Self-pay | Admitting: *Deleted

## 2014-09-29 LAB — BMP8+EGFR
BUN/Creatinine Ratio: 18 (ref 9–23)
BUN: 13 mg/dL (ref 6–24)
CO2: 27 mmol/L (ref 18–29)
Calcium: 9.2 mg/dL (ref 8.7–10.2)
Chloride: 98 mmol/L (ref 97–108)
Creatinine, Ser: 0.72 mg/dL (ref 0.57–1.00)
GFR, EST AFRICAN AMERICAN: 108 mL/min/{1.73_m2} (ref >59)
GFR, EST NON AFRICAN AMERICAN: 94 mL/min/{1.73_m2} (ref >59)
Glucose: 109 mg/dL — ABNORMAL HIGH (ref 65–99)
POTASSIUM: 4.6 mmol/L (ref 3.5–5.2)
SODIUM: 140 mmol/L (ref 134–144)

## 2014-09-29 LAB — HEPATIC FUNCTION PANEL
ALK PHOS: 139 IU/L — AB (ref 39–117)
ALT: 21 IU/L (ref 0–32)
AST: 24 IU/L (ref 0–40)
Albumin: 4.4 g/dL (ref 3.5–5.5)
BILIRUBIN DIRECT: 0.07 mg/dL (ref 0.00–0.40)
Total Bilirubin: 0.2 mg/dL (ref 0.0–1.2)
Total Protein: 7.4 g/dL (ref 6.0–8.5)

## 2014-09-29 LAB — NMR, LIPOPROFILE
Cholesterol: 201 mg/dL — ABNORMAL HIGH (ref 100–199)
HDL Cholesterol by NMR: 49 mg/dL (ref >39)
HDL Particle Number: 35.7 umol/L (ref >=30.5)
LDL PARTICLE NUMBER: 1518 nmol/L — AB (ref <1000)
LDL SIZE: 21.4 nm (ref >20.5)
LDL-C: 117 mg/dL — ABNORMAL HIGH (ref 0–99)
LP-IR SCORE: 53 — AB (ref <=45)
Small LDL Particle Number: 426 nmol/L (ref <=527)
Triglycerides by NMR: 173 mg/dL — ABNORMAL HIGH (ref 0–149)

## 2014-09-29 LAB — VITAMIN D 25 HYDROXY (VIT D DEFICIENCY, FRACTURES): Vit D, 25-Hydroxy: 40.7 ng/mL (ref 30.0–100.0)

## 2014-09-29 NOTE — Telephone Encounter (Signed)
Pt aware of all blood work results and instructed to pay attention to diet and cholestrol

## 2014-09-29 NOTE — Telephone Encounter (Signed)
-----   Message from [name], MD sent at 09/29/2014  8:47 AM EST ----- The vitamin D level is within normal limits at 40.7. Make sure the patient is taking her vitamin D regularly. I would like to see this number in the 50-60 range. The blood sugar slightly elevated at 109. The creatinine the most important kidney function test is within normal limits. The electrolytes including potassium are within normal limits. 1 liver function test remains elevated within the same range that it has been elevated in the past. All other liver function tests are within normal limits Cholesterol numbers with advanced lipid testing haven't total LDL particle number that is elevated more than it has been recently. The values 1518. This number should be less than 1000. The LDL C is elevated 117 and the should be less than 100. The triglycerides are elevated at 173 and the should be less than 150.-----she should continue her current cholesterol medication but should try to do better with her diet and physical activity and exercise. We will recheck this again in 4 months and hope that it will come down more with a more attention to her diet and getting more exercise

## 2014-11-05 ENCOUNTER — Ambulatory Visit: Payer: Medicare Other | Admitting: Internal Medicine

## 2014-11-05 ENCOUNTER — Other Ambulatory Visit: Payer: Self-pay | Admitting: Family Medicine

## 2014-11-25 ENCOUNTER — Telehealth: Payer: Self-pay | Admitting: Internal Medicine

## 2014-11-25 NOTE — Telephone Encounter (Signed)
When calling the number that was left for [place] we have to put in an extension. Nothing was left and there isn't an option to ask for a representative. I do not know how to complete this message without this information.

## 2014-12-07 ENCOUNTER — Other Ambulatory Visit: Payer: Self-pay | Admitting: Family Medicine

## 2014-12-11 ENCOUNTER — Ambulatory Visit: Payer: Medicare Other | Admitting: Internal Medicine

## 2014-12-14 ENCOUNTER — Telehealth: Payer: Self-pay | Admitting: Internal Medicine

## 2014-12-14 NOTE — Telephone Encounter (Signed)
Called [employer], spoke with Broadus John- pt's Marinell Blight is approved for 1 year.  Case # [id number].  Left a message for Julianne with this information.  Nothing further needed.

## 2014-12-17 ENCOUNTER — Ambulatory Visit: Payer: Self-pay | Admitting: Internal Medicine

## 2015-01-01 ENCOUNTER — Encounter: Payer: Self-pay | Admitting: Internal Medicine

## 2015-01-01 ENCOUNTER — Ambulatory Visit (INDEPENDENT_AMBULATORY_CARE_PROVIDER_SITE_OTHER): Payer: Medicare Other | Admitting: Internal Medicine

## 2015-01-01 VITALS — BP 122/74 | HR 79 | Ht 65.0 in | Wt 191.4 lb

## 2015-01-01 DIAGNOSIS — J418 Mixed simple and mucopurulent chronic bronchitis: Secondary | ICD-10-CM

## 2015-01-01 DIAGNOSIS — E8801 Alpha-1-antitrypsin deficiency: Secondary | ICD-10-CM | POA: Diagnosis not present

## 2015-01-01 MED ORDER — COMPRESSOR NEBULIZER MISC: 1.0000 | Status: AC | PRN

## 2015-01-01 NOTE — Assessment & Plan Note (Signed)
She continues replacement therapy for her alpha 1 antitrypsin deficiency with no problems

## 2015-01-01 NOTE — Assessment & Plan Note (Signed)
She is not on oxygen and resting saturation today on room air was 94%. I do hear mild bilateral rhonchi but she is clinically near her baseline without acute Plan-replacement for her old nebulizer machine.

## 2015-01-01 NOTE — Patient Instructions (Signed)
Script printed for replacement nebulizer compressor machine  Please call as needed

## 2015-01-01 NOTE — Addendum Note (Signed)
Addended by: Baird Lyons D on: 01/01/2015 04:28 PM   Modules accepted: Miquel Dunn

## 2015-01-01 NOTE — Progress Notes (Signed)
Patient ID: [name], female    DOB: Jul 22, 1956, 59 y.o.   MRN: [id number]  HPI 05/22/11- 64 yoF  smoker on replacement for a1AT deficiency, with severe COPD, complicated by OSA and fibromyalgia. Last here November 22, 2010. Still smoking 1/3 ppd. We discussed her mood changes with Chantix. She has never tried patches.  Coughing more in hot weather, around strong smells. Short of breath may be a little worse over time, especially if carrying something. Denies chest pain or palpitation. Feet and legs swell some and she notes nocturia x 2-3.   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 Husband is here. She has had flu and pneumonia vaccines. Acute visit-new respiratory infection started about 2 days ago with dry cough, fever to 100 and 101, no chills, frontal headache, nasal congestion. She denies rash, nodes, GI upset or myalgias/arthralgias. She had called our on-call physician yesterday is going to sent Levaquin but her drugstore was already closed. A couple of grandchildren have been recently ill with viral syndromes.   05/02/12- 38 yoF former smoker on replacement for a1AT deficiency, with severe COPD, complicated by OSA and fibromyalgia. Says she quit smoking 04/01/12 !  Here with granddaughter.  Her COPD assessment test(CAT) score today 17/40. Or treatment cleared her bronchitis symptoms in December. In April she got bronchitis again treated by her primary physician with a home nebulizer/albuterol, used twice a day. Chronic productive cough, usually  white. CXR 11/02/11-reviewed with her IMPRESSION:  There is no evidence of acute cardiac or pulmonary process.  Original Report Authenticated By: Duayne Cal, M.D.  Dr. Laurance Flatten did a chest x-ray 03/19/2012-COPD with emphysema and bronchitis changes. She says Advair caused edema. She continues alpha replacement Zemaira/ [facility].  11/05/12-  89 yoF former smoker on  replacement for a1AT deficiency, with severe COPD, complicated by OSA and fibromyalgia. FOLLOWS FOR: doing good overall; no more than usual SOB, wheezing ,cough, or congestion    Daughter here Had large local reaction with heat and rash after flu shot last year( Fluzone) so we discussed different brands. She continues on Zemaira IV/ [facility]. replacement therapy for alpha-1 antitrypsin deficiency CXR 03/19/12- recommended follow-up attention to ? Early fibrotic changes   05/06/13-  55 yoF former smoker on replacement for a1AT deficiency, with severe COPD, complicated by OSA and fibromyalgia. Zemaira alpha 1 replacement. FOLLOWS FOR: review PFT results with patient, no more than usual of SOB. She continues her alpha 1 replacement intravenously each week with no problem. Some productive cough with clear sputum. Flonase worked well in the spring for rhinitis. Still noticing dyspnea on exertion especially if carrying a weight. Symbicort helps a lot. PFT- 05/06/2013-reduced FVC may reflect effort but the overall pattern is moderate obstructive airways disease with response to bronchodilator, air trapping, normal diffusion. This doesn't fit entirely with emphysema.  CXR 11/28/12 IMPRESSION:  No acute cardiopulmonary disease. Findings consistent with COPD.  No change from the prior study.  Original Report Authenticated By: Lajean Manes, M.D.  11/04/13- 12 yoF former smoker on replacement for a1AT deficiency, with severe COPD, complicated by OSA and fibromyalgia. Zemaira alpha 1 replacement.  Husband here Follows for- 6 month rov.  Pt c/o SOB with exertion, no other complaints.  Zemaira/ [facility] Baseline the congested cough but doing well. Discussed her vaccine status.  05/05/14- 24 yoF former smoker on replacement for a1AT deficiency, with severe COPD, complicated by OSA and fibromyalgia. Zemaira  alpha 1 replacement.  Husband here FOLLOWS [id number] flare ups-staying out of the heat.  She is avoiding  the summer heat by staying in air conditioned areas. No acute events. Cough is usually productive. Says her nebulizer machine is worn out and asks if we can replace it  01/01/15 66 yoF former smoker on replacement for a1AT deficiency, with severe COPD, complicated by OSA and fibromyalgia. Zemaira alpha 1 replacement.  Husband here Dr. Laurance Flatten keeps her pneumonia vaccine up-to-date. She lost prescription from last visit for replacement nebulizer machine and asks up-to-date. Scant sputum is usually clear. No acute event CXR 05/05/14- IMPRESSION: COPD. There is no acute cardiopulmonary disease. Electronically Signed  By: David Martinique  On: 05/05/2014 11:36  ROS-see HPI Constitutional:   No-   weight loss, night sweats, fevers, chills, fatigue, lassitude. HEENT:   No-  headaches, difficulty swallowing, tooth/dental problems, sore throat,       No-  sneezing, itching, ear ache, nasal congestion, post nasal drip,  CV:  No-   chest pain, orthopnea, PND, swelling in lower extremities, anasarca, dizziness, palpitations Resp: +shortness of breath with exertion or at rest.              No-productive cough,  No non-productive cough,  No- coughing up of blood.              No-change in color of mucus.  No- wheezing.   Skin: No-   rash or lesions. GI:  No-   heartburn, indigestion, abdominal pain, nausea, vomiting, GU:  MS:  No-   joint pain or swelling.   Neuro-     nothing unusual Psych:  No- change in mood or affect. No depression or anxiety.  No memory loss.   Objective:   Physical Exam General- Alert, Oriented, Affect-appropriate, Distress- none acute, obese Skin- rash-none, lesions- none, excoriation- none Lymphadenopathy- none Head- atraumatic            Eyes- Gross vision intact, PERRLA, conjunctivae clear secretions            Ears- Hearing, canals-normal            Nose- congested, no-Septal dev, mucus, polyps, erosion, perforation             Throat- Mallampati II , mucosa clear ,  drainage- none,                              tonsils- atrophic, +dentures Neck- flexible , trachea midline, no stridor , thyroid nl, carotid no bruit Chest - symmetrical excursion , unlabored           Heart/CV- RRR , no murmur , no gallop  , no rub, nl s1 s2                           - JVD- none , edema- none, stasis changes- none, varices- none           Lung- cough+ light , +bilateral rhonchi, dullness-none, rub- none. Unlabored           Chest wall-  Abd-  Br/ Gen/ Rectal- Not done, not indicated Extrem- cyanosis- none, clubbing, none, atrophy- none, strength- nl Neuro- grossly intact to observation

## 2015-01-06 ENCOUNTER — Other Ambulatory Visit: Payer: Self-pay | Admitting: Family Medicine

## 2015-01-11 ENCOUNTER — Other Ambulatory Visit: Payer: Self-pay

## 2015-01-11 DIAGNOSIS — Z1212 Encounter for screening for malignant neoplasm of rectum: Secondary | ICD-10-CM | POA: Diagnosis not present

## 2015-01-11 NOTE — Progress Notes (Signed)
Lab only 

## 2015-01-12 DIAGNOSIS — J45909 Unspecified asthma, uncomplicated: Secondary | ICD-10-CM | POA: Diagnosis not present

## 2015-01-14 ENCOUNTER — Encounter: Payer: Self-pay | Admitting: Family Medicine

## 2015-01-14 ENCOUNTER — Other Ambulatory Visit: Payer: Self-pay

## 2015-01-14 LAB — FECAL OCCULT BLOOD, IMMUNOCHEMICAL: FECAL OCCULT BLD: NEGATIVE

## 2015-01-14 MED ORDER — ROSUVASTATIN CALCIUM 40 MG PO TABS: ORAL_TABLET | ORAL | Status: DC

## 2015-01-21 DIAGNOSIS — M810 Age-related osteoporosis without current pathological fracture: Secondary | ICD-10-CM | POA: Diagnosis not present

## 2015-01-21 DIAGNOSIS — M150 Primary generalized (osteo)arthritis: Secondary | ICD-10-CM | POA: Diagnosis not present

## 2015-01-21 DIAGNOSIS — M797 Fibromyalgia: Secondary | ICD-10-CM | POA: Diagnosis not present

## 2015-02-01 ENCOUNTER — Ambulatory Visit (INDEPENDENT_AMBULATORY_CARE_PROVIDER_SITE_OTHER): Payer: Medicare Other | Admitting: Family Medicine

## 2015-02-01 ENCOUNTER — Encounter: Payer: Self-pay | Admitting: Family Medicine

## 2015-02-01 VITALS — BP 132/81 | HR 86 | Temp 97.9°F | Ht 65.0 in | Wt 187.0 lb

## 2015-02-01 DIAGNOSIS — J449 Chronic obstructive pulmonary disease, unspecified: Secondary | ICD-10-CM | POA: Diagnosis not present

## 2015-02-01 DIAGNOSIS — E559 Vitamin D deficiency, unspecified: Secondary | ICD-10-CM | POA: Diagnosis not present

## 2015-02-01 DIAGNOSIS — E8881 Metabolic syndrome: Secondary | ICD-10-CM

## 2015-02-01 DIAGNOSIS — M797 Fibromyalgia: Secondary | ICD-10-CM

## 2015-02-01 DIAGNOSIS — E8801 Alpha-1-antitrypsin deficiency: Secondary | ICD-10-CM | POA: Diagnosis not present

## 2015-02-01 DIAGNOSIS — E785 Hyperlipidemia, unspecified: Secondary | ICD-10-CM | POA: Diagnosis not present

## 2015-02-01 LAB — POCT CBC
Granulocyte percent: 62.6 %G (ref 37–80)
HCT, POC: 48 % — AB (ref 37.7–47.9)
Hemoglobin: 15.4 g/dL (ref 12.2–16.2)
LYMPH, POC: 2.6 (ref 0.6–3.4)
MCH: 29.2 pg (ref 27–31.2)
MCHC: 32 g/dL (ref 31.8–35.4)
MCV: 91 fL (ref 80–97)
MPV: 7.4 fL (ref 0–99.8)
PLATELET COUNT, POC: 311 10*3/uL (ref 142–424)
POC Granulocyte: 5.6 (ref 2–6.9)
POC LYMPH %: 29.1 % (ref 10–50)
RBC: 5.27 M/uL (ref 4.04–5.48)
RDW, POC: 14.6 %
WBC: 8.9 10*3/uL (ref 4.6–10.2)

## 2015-02-01 MED ORDER — EZETIMIBE 10 MG PO TABS: 10.0000 mg | ORAL_TABLET | Freq: Every day | ORAL | Status: DC

## 2015-02-01 NOTE — Progress Notes (Signed)
Subjective:    Patient ID: [name], female    DOB: 11/08/1956, 59 y.o.   MRN: [id number]  HPI Pt here for follow up and management of chronic medical problems which includes COPD and hyperlipidemia. She is taking medications regularly. The patient sees Dr. Annamaria Boots, the pulmonologist about every 6 months for alpha 1 antitrypsin deficiency and she saw him recently and he was please with her condition. She also sees Dr. Amil Amen the rheumatologist because of her fibromyalgia and that's about every 6 months also. She is doing well with her lung disease and has recently had a gastrointestinal virus, having loose bowel movements frequently and this is improving. She says her stools are still somewhat dark in nature. She denies any epigastric pain or heartburn. She is trying to exercise as much as possible and she is careful not to be around people that are sick and have respiratory problems.        Patient Active Problem List   Diagnosis Date Noted  . Hyperlipidemia 10/06/2013  . Osteoporosis 06/25/2013  . Rhinitis, nonallergic 05/08/2012  . TOBACCO USER 11/22/2010  . DYSPNEA 06/24/2008  . SLEEP APNEA 06/02/2008  . Alpha-1-antitrypsin deficiency 12/06/2007  . COPD (chronic obstructive pulmonary disease) 09/14/2007  . Fibromyalgia 09/14/2007   Outpatient Encounter Prescriptions as of 02/01/2015  Medication Sig  . albuterol (PROVENTIL) (2.5 MG/3ML) 0.083% nebulizer solution Take 2.5 mg by nebulization every 6 (six) hours as needed.  Marland Kitchen alpha-1-proteinase inhibitor, human, (ZEMAIRA) 1000 MG SOLR Inject 60 mg/kg into the vein once a week.   Marland Kitchen amitriptyline (ELAVIL) 75 MG tablet Take 75 mg by mouth at bedtime.    Marland Kitchen FLUoxetine (PROZAC) 20 MG capsule Take 20 mg by mouth daily.    . fluticasone (FLONASE) 50 MCG/ACT nasal spray USE 2 SPRAYS IN EACH NOSTRIL ONCE DAILY.  . furosemide (LASIX) 20 MG tablet 1 daily for occasional use - diuretic  . guaiFENesin (MUCINEX) 600 MG 12 hr tablet Take  1,200 mg by mouth 2 (two) times daily.    Marland Kitchen HYDROcodone-acetaminophen (NORCO/VICODIN) 5-325 MG per tablet Take 1 tablet by mouth 3 (three) times daily.  . Nebulizers (COMPRESSOR NEBULIZER) MISC 1 Device by Does not apply route as needed.  . Nebulizers (COMPRESSOR/NEBULIZER) MISC Use as directed  . omeprazole (PRILOSEC) 40 MG capsule TAKE (1) CAPSULE DAILY  . PROAIR HFA 108 (90 BASE) MCG/ACT inhaler 2 PUFFS EVERY 4 HOURS AS NEEDED FOR WHEEZING  . rosuvastatin (CRESTOR) 40 MG tablet TAKE (1) TABLET DAILY AS DIRECTED.  Marland Kitchen SPIRIVA HANDIHALER 18 MCG inhalation capsule INHALE 1 CAPSULE DAILY  . SYMBICORT 80-4.5 MCG/ACT inhaler 2 PUFFS 2 TIMES A DAY  . ZETIA 10 MG tablet TAKE 1 TABLET ONCE A DAY     Review of Systems  Constitutional: Negative.   HENT: Negative.   Eyes: Negative.   Respiratory: Negative.   Cardiovascular: Negative.   Gastrointestinal: Negative.        Recent GI virus  Endocrine: Negative.   Genitourinary: Negative.   Musculoskeletal: Negative.   Skin: Negative.   Allergic/Immunologic: Negative.   Neurological: Negative.   Hematological: Negative.   Psychiatric/Behavioral: Negative.        Objective:   Physical Exam  Constitutional: She is oriented to person, place, and time. She appears well-developed and well-nourished. No distress.  The patient is pleasant, alert and in good spirits today.  HENT:  Head: Normocephalic and atraumatic.  Right Ear: External ear normal.  Left Ear: External ear normal.  Nose: Nose  normal.  Mouth/Throat: Oropharynx is clear and moist.  Eyes: Conjunctivae and EOM are normal. Pupils are equal, round, and reactive to light. Right eye exhibits no discharge. Left eye exhibits no discharge. No scleral icterus.  Neck: Normal range of motion. Neck supple. No JVD present. No thyromegaly present.  No anterior cervical nodes  Cardiovascular: Normal rate, regular rhythm, normal heart sounds and intact distal pulses.  Exam reveals no gallop and no  friction rub.   No murmur heard. The heart is regular at 72/m  Pulmonary/Chest: Effort normal. No respiratory distress. She has wheezes. She has rales. She exhibits no tenderness.  There are crackles bilaterally because of her lung disease and these appear stable from the past. There is condition with coughing and this is also stable.  Abdominal: Soft. Bowel sounds are normal. She exhibits no mass. There is no tenderness. There is no rebound and no guarding.  The abdomen was generally tender and somewhat gaseous  Musculoskeletal: Normal range of motion. She exhibits no edema or tenderness.  Lymphadenopathy:    She has no cervical adenopathy.  Neurological: She is alert and oriented to person, place, and time. She has normal reflexes. No cranial nerve deficit.  Skin: Skin is warm and dry. No rash noted.  Psychiatric: She has a normal mood and affect. Her behavior is normal. Judgment and thought content normal.  Nursing note and vitals reviewed.  BP 132/81 mmHg  Pulse 86  Temp(Src) 97.9 F (36.6 C) (Oral)  Ht $R'5\' 5"'dl$  (1.651 m)  Wt 187 lb (84.823 kg)  BMI 31.12 kg/m2        Assessment & Plan:   1. Vitamin D deficiency -Continue current treatment pending results of lab work - POCT CBC - Vit D  25 hydroxy (rtn osteoporosis monitoring)  2. Hyperlipidemia -Continue Crestor and try to do better with diet and exercise because of the higher numbers on the last blood work. - POCT CBC - BMP8+EGFR - Hepatic function panel - NMR, lipoprofile  3. Chronic obstructive pulmonary disease, unspecified COPD, unspecified chronic bronchitis type -Continue with current breathing inhalers and regular follow-up visits with Dr. Annamaria Boots - POCT CBC  4. Metabolic syndrome -Exercise and diet will help the metabolic syndrome - POCT CBC - BMP8+EGFR  5. Alpha-1-antitrypsin deficiency -Continue follow-up with pulmonology - POCT CBC  6. Fibromyalgia -Continue follow-up with rheumatology - POCT  CBC  Meds ordered this encounter  Medications  . ezetimibe (ZETIA) 10 MG tablet    Sig: Take 1 tablet (10 mg total) by mouth daily.    Dispense:  30 tablet    Refill:  3   Patient Instructions                       Medicare Annual Wellness Visit  [facility] and the medical providers at [facility] strive to bring you the best medical care.  In doing so we not only want to address your current medical conditions and concerns but also to detect new conditions early and prevent illness, disease and health-related problems.    Medicare offers a yearly Wellness Visit which allows our clinical staff to assess your need for preventative services including immunizations, lifestyle education, counseling to decrease risk of preventable diseases and screening for fall risk and other medical concerns.    This visit is provided free of charge (no copay) for all Medicare recipients. The clinical pharmacists at [facility] have begun to conduct these Wellness Visits  which will also include a thorough review of all your medications.    As you primary medical provider recommend that you make an appointment for your Annual Wellness Visit if you have not done so already this year.  You may set up this appointment before you leave today or you may call back ([phone]) and schedule an appointment.  Please make sure when you call that you mention that you are scheduling your Annual Wellness Visit with the clinical pharmacist so that the appointment may be made for the proper length of time.     Continue current medications. Continue good therapeutic lifestyle changes which include good diet and exercise. Fall precautions discussed with patient. If an FOBT was given today- please return it to our front desk. If you are over 26 years old - you may need Prevnar 28 or the adult Pneumonia vaccine.  Flu Shots are still available at our office. If you still haven't had  one please call to set up a nurse visit to get one.   After your visit with [place] today you will receive a survey in the mail or online from [employer] regarding your care with [place]. Please take a moment to fill this out. Your feedback is very important to [place] as you can help [place] better understand your patient needs as well as improve your experience and satisfaction. WE CARE ABOUT YOU!!!   Continue regular follow-ups with rheumatology and pulmonology Continue good respiratory hygiene and hand cleanliness to avoid getting respiratory infections As far as the resolving intestinal virus, continue on a bland diet for a few more days avoiding milk cheese ice cream and dairy products and caffeine. Continue Crestor and try to do better with exercise and diet habitshe noting that the last total LDL particle number was in the 1500 range.   Arrie Senate MD

## 2015-02-01 NOTE — Patient Instructions (Addendum)
Medicare Annual Wellness Visit  [facility] and the medical providers at [facility] strive to bring you the best medical care.  In doing so we not only want to address your current medical conditions and concerns but also to detect new conditions early and prevent illness, disease and health-related problems.    Medicare offers a yearly Wellness Visit which allows our clinical staff to assess your need for preventative services including immunizations, lifestyle education, counseling to decrease risk of preventable diseases and screening for fall risk and other medical concerns.    This visit is provided free of charge (no copay) for all Medicare recipients. The clinical pharmacists at [facility] have begun to conduct these Wellness Visits which will also include a thorough review of all your medications.    As you primary medical provider recommend that you make an appointment for your Annual Wellness Visit if you have not done so already this year.  You may set up this appointment before you leave today or you may call back ([phone]) and schedule an appointment.  Please make sure when you call that you mention that you are scheduling your Annual Wellness Visit with the clinical pharmacist so that the appointment may be made for the proper length of time.     Continue current medications. Continue good therapeutic lifestyle changes which include good diet and exercise. Fall precautions discussed with patient. If an FOBT was given today- please return it to our front desk. If you are over 23 years old - you may need Prevnar 76 or the adult Pneumonia vaccine.  Flu Shots are still available at our office. If you still haven't had one please call to set up a nurse visit to get one.   After your visit with [place] today you will receive a survey in the mail or online from [employer] regarding your care with [place]. Please take a moment to  fill this out. Your feedback is very important to [place] as you can help [place] better understand your patient needs as well as improve your experience and satisfaction. WE CARE ABOUT YOU!!!   Continue regular follow-ups with rheumatology and pulmonology Continue good respiratory hygiene and hand cleanliness to avoid getting respiratory infections As far as the resolving intestinal virus, continue on a bland diet for a few more days avoiding milk cheese ice cream and dairy products and caffeine. Continue Crestor and try to do better with exercise and diet habitshe noting that the last total LDL particle number was in the 1500 range.

## 2015-02-02 LAB — BMP8+EGFR
BUN / CREAT RATIO: 13 (ref 9–23)
BUN: 8 mg/dL (ref 6–24)
CHLORIDE: 98 mmol/L (ref 97–108)
CO2: 27 mmol/L (ref 18–29)
Calcium: 9.6 mg/dL (ref 8.7–10.2)
Creatinine, Ser: 0.62 mg/dL (ref 0.57–1.00)
GFR calc Af Amer: 117 mL/min/{1.73_m2} (ref >59)
GFR calc non Af Amer: 101 mL/min/{1.73_m2} (ref >59)
Glucose: 107 mg/dL — ABNORMAL HIGH (ref 65–99)
Potassium: 3.9 mmol/L (ref 3.5–5.2)
SODIUM: 140 mmol/L (ref 134–144)

## 2015-02-02 LAB — HEPATIC FUNCTION PANEL
ALBUMIN: 4.4 g/dL (ref 3.5–5.5)
ALK PHOS: 125 IU/L — AB (ref 39–117)
ALT: 16 IU/L (ref 0–32)
AST: 33 IU/L (ref 0–40)
BILIRUBIN TOTAL: 0.3 mg/dL (ref 0.0–1.2)
Bilirubin, Direct: 0.09 mg/dL (ref 0.00–0.40)
Total Protein: 7.5 g/dL (ref 6.0–8.5)

## 2015-02-02 LAB — NMR, LIPOPROFILE
Cholesterol: 164 mg/dL (ref 100–199)
HDL CHOLESTEROL BY NMR: 41 mg/dL (ref >39)
HDL PARTICLE NUMBER: 33.7 umol/L (ref >=30.5)
LDL PARTICLE NUMBER: 1686 nmol/L — AB (ref <1000)
LDL Size: 20.3 nm (ref >20.5)
LDL-C: 88 mg/dL (ref 0–99)
LP-IR Score: 65 — ABNORMAL HIGH (ref <=45)
Small LDL Particle Number: 948 nmol/L — ABNORMAL HIGH (ref <=527)
Triglycerides by NMR: 173 mg/dL — ABNORMAL HIGH (ref 0–149)

## 2015-02-02 LAB — VITAMIN D 25 HYDROXY (VIT D DEFICIENCY, FRACTURES): Vit D, 25-Hydroxy: 46.7 ng/mL (ref 30.0–100.0)

## 2015-02-03 ENCOUNTER — Telehealth: Payer: Self-pay | Admitting: Family Medicine

## 2015-02-03 NOTE — Telephone Encounter (Signed)
Patient aware of results.

## 2015-02-05 ENCOUNTER — Other Ambulatory Visit: Payer: Self-pay | Admitting: Family Medicine

## 2015-02-10 DIAGNOSIS — J45909 Unspecified asthma, uncomplicated: Secondary | ICD-10-CM | POA: Diagnosis not present

## 2015-03-08 ENCOUNTER — Other Ambulatory Visit: Payer: Self-pay | Admitting: Family Medicine

## 2015-03-13 DIAGNOSIS — J45909 Unspecified asthma, uncomplicated: Secondary | ICD-10-CM | POA: Diagnosis not present

## 2015-03-30 ENCOUNTER — Other Ambulatory Visit: Payer: Self-pay | Admitting: Family Medicine

## 2015-04-05 DIAGNOSIS — Z6831 Body mass index (BMI) 31.0-31.9, adult: Secondary | ICD-10-CM | POA: Diagnosis not present

## 2015-04-05 DIAGNOSIS — R030 Elevated blood-pressure reading, without diagnosis of hypertension: Secondary | ICD-10-CM | POA: Diagnosis not present

## 2015-04-05 DIAGNOSIS — M545 Low back pain: Secondary | ICD-10-CM | POA: Diagnosis not present

## 2015-04-05 DIAGNOSIS — M542 Cervicalgia: Secondary | ICD-10-CM | POA: Diagnosis not present

## 2015-06-01 ENCOUNTER — Ambulatory Visit (INDEPENDENT_AMBULATORY_CARE_PROVIDER_SITE_OTHER): Payer: Medicare Other | Admitting: Family Medicine

## 2015-06-01 ENCOUNTER — Encounter: Payer: Self-pay | Admitting: Family Medicine

## 2015-06-01 VITALS — BP 132/87 | HR 75 | Temp 97.5°F | Ht 65.0 in | Wt 184.0 lb

## 2015-06-01 DIAGNOSIS — M810 Age-related osteoporosis without current pathological fracture: Secondary | ICD-10-CM

## 2015-06-01 DIAGNOSIS — J418 Mixed simple and mucopurulent chronic bronchitis: Secondary | ICD-10-CM | POA: Diagnosis not present

## 2015-06-01 DIAGNOSIS — E785 Hyperlipidemia, unspecified: Secondary | ICD-10-CM | POA: Diagnosis not present

## 2015-06-01 LAB — POCT CBC
Granulocyte percent: 52.1 %G (ref 37–80)
HCT, POC: 46.9 % (ref 37.7–47.9)
Hemoglobin: 14.7 g/dL (ref 12.2–16.2)
Lymph, poc: 3.1 (ref 0.6–3.4)
MCH, POC: 28 pg (ref 27–31.2)
MCHC: 31.2 g/dL — AB (ref 31.8–35.4)
MCV: 89.6 fL (ref 80–97)
MPV: 6.8 fL (ref 0–99.8)
PLATELET COUNT, POC: 326 10*3/uL (ref 142–424)
POC Granulocyte: 3.9 (ref 2–6.9)
POC LYMPH PERCENT: 40.8 %L (ref 10–50)
RBC: 5.24 M/uL (ref 4.04–5.48)
RDW, POC: 15.3 %
WBC: 7.5 10*3/uL (ref 4.6–10.2)

## 2015-06-01 LAB — LIPID PANEL: LDL CALC: 111 mg/dL

## 2015-06-01 NOTE — Patient Instructions (Addendum)
Continue current medications. Continue good therapeutic lifestyle changes which include good diet and exercise. Fall precautions discussed with patient. If an FOBT was given today- please return it to our front desk. If you are over 57 years old - you may need Prevnar 66 or the adult Pneumonia vaccine.    After your visit with [place] today you will receive a survey in the mail or online from [employer] regarding your care with [place]. Please take a moment to fill this out. Your feedback is very important to [place] as you can help [place] better understand your patient needs as well as improve your experience and satisfaction. WE CARE ABOUT YOU!!!   We will call you with lab work results once those results become available Continue your current medicines Walk and exercise as much as possible If you decide to stop taking the Prilosec generic, you can switch to Zantac 150 twice daily before breakfast and supper We will arrange for you to have a DEXA scan in the office and please follow-up with your gynecologist for your pelvic exam and Pap smear

## 2015-06-01 NOTE — Progress Notes (Signed)
Subjective:    Patient ID: [name], female    DOB: [birth year], 59 y.o.   MRN: [id number]  HPI  59 year old female comes in today for follow up on chronic medical conditions which include osteoporosis, COPD, and hyperlipidemia. The patient comes in today for her regular follow-up. She has alpha 1 antitrypsin deficiency and is followed regularly by her pulmonologist in [place]. She has no specific complaints. She is due to get her pelvic exam nail. She is also due to get a DEXA scan in August or September. She will get routine lab work today. She will check with her insurance regarding the Prevnar vaccine and with her pulmonologist. The pulmonologist has told her that should the Prevnar vaccine would not help her. She sees him every 6 months. She did have some questions today about Prilosec. I informed her that she could switch to Zantac 150 twice daily and discontinue the Prilosec. She denies chest pain and only has shortness of breath when she is carrying materials and walking. She has no trouble swallowing with heartburn and indigestion nausea vomiting or diarrhea or blood in the stool. She is passing her water without problems.  Patient Active Problem List   Diagnosis Date Noted  . Hyperlipidemia 10/06/2013  . Osteoporosis 06/25/2013  . Rhinitis, nonallergic 05/08/2012  . TOBACCO USER 11/22/2010  . DYSPNEA 06/24/2008  . SLEEP APNEA 06/02/2008  . Alpha-1-antitrypsin deficiency 12/06/2007  . COPD (chronic obstructive pulmonary disease) 09/14/2007  . Fibromyalgia 09/14/2007   Outpatient Encounter Prescriptions as of 06/01/2015  Medication Sig  . albuterol (PROVENTIL) (2.5 MG/3ML) 0.083% nebulizer solution Take 2.5 mg by nebulization every 6 (six) hours as needed.  Marland Kitchen alpha-1-proteinase inhibitor, human, (ZEMAIRA) 1000 MG SOLR Inject 60 mg/kg into the vein once a week.   Marland Kitchen amitriptyline (ELAVIL) 75 MG tablet Take 75 mg by mouth at bedtime.    . CRESTOR 40 MG tablet TAKE (1) TABLET  DAILY AS DIRECTED.  Marland Kitchen ezetimibe (ZETIA) 10 MG tablet Take 1 tablet (10 mg total) by mouth daily.  Marland Kitchen FLUoxetine (PROZAC) 20 MG capsule Take 20 mg by mouth daily.    . fluticasone (FLONASE) 50 MCG/ACT nasal spray USE 2 SPRAYS IN EACH NOSTRIL ONCE DAILY.  . furosemide (LASIX) 20 MG tablet 1 daily for occasional use - diuretic  . guaiFENesin (MUCINEX) 600 MG 12 hr tablet Take 1,200 mg by mouth 2 (two) times daily.    Marland Kitchen HYDROcodone-acetaminophen (NORCO/VICODIN) 5-325 MG per tablet Take 1 tablet by mouth 3 (three) times daily.  . Nebulizers (COMPRESSOR NEBULIZER) MISC 1 Device by Does not apply route as needed.  . Nebulizers (COMPRESSOR/NEBULIZER) MISC Use as directed  . omeprazole (PRILOSEC) 40 MG capsule TAKE (1) CAPSULE DAILY  . PROAIR HFA 108 (90 BASE) MCG/ACT inhaler 2 PUFFS EVERY 4 HOURS AS NEEDED FOR WHEEZING  . SPIRIVA HANDIHALER 18 MCG inhalation capsule INHALE 1 CAPSULE DAILY  . SYMBICORT 80-4.5 MCG/ACT inhaler 2 PUFFS 2 TIMES A DAY   No facility-administered encounter medications on file as of 06/01/2015.      Review of Systems  Constitutional: Negative.   HENT: Negative.   Eyes: Negative.   Respiratory: Negative.   Cardiovascular: Negative.   Gastrointestinal: Negative.   Endocrine: Negative.   Genitourinary: Negative.   Musculoskeletal: Negative.   Skin: Negative.   Allergic/Immunologic: Negative.   Neurological: Negative.   Hematological: Negative.   Psychiatric/Behavioral: Negative.        Objective:   Physical Exam  Constitutional: She is  oriented to person, place, and time. She appears well-developed and well-nourished.  HENT:  Right Ear: External ear normal.  Left Ear: External ear normal.  Nose: Nose normal.  Mouth/Throat: Oropharynx is clear and moist.  Eyes: Conjunctivae and EOM are normal. Pupils are equal, round, and reactive to light. Right eye exhibits no discharge. Left eye exhibits no discharge. No scleral icterus.  Neck: Normal range of motion. Neck  supple. No thyromegaly present.  No carotid bruits or anterior cervical adenopathy  Cardiovascular: Normal rate, regular rhythm, normal heart sounds and intact distal pulses.  Exam reveals no gallop and no friction rub.   No murmur heard. At 72/m  Pulmonary/Chest: Effort normal. No respiratory distress. She has no wheezes. She has no rales. She exhibits no tenderness.  There are crackles bilaterally and this is normal for this patient on auscultation because of her lung disease  Abdominal: Soft. Bowel sounds are normal. She exhibits no mass. There is tenderness. There is no rebound and no guarding.  Slight epigastric tenderness  Musculoskeletal: Normal range of motion. She exhibits no edema or tenderness.  Lymphadenopathy:    She has no cervical adenopathy.  Neurological: She is alert and oriented to person, place, and time. She has normal reflexes. No cranial nerve deficit.  Skin: Skin is warm and dry. No rash noted.  Psychiatric: She has a normal mood and affect. Her behavior is normal. Judgment and thought content normal.  Nursing note and vitals reviewed.   BP 132/87 mmHg  Pulse 75  Temp(Src) 97.5 F (36.4 C) (Oral)  Ht 5' 5" (1.651 m)  Wt 184 lb (83.462 kg)  BMI 30.62 kg/m2       Assessment & Plan:  1. Osteoporosis -Patient is aware that she needs to come back and get a DEXA scan - Vit D  25 hydroxy (rtn osteoporosis monitoring) - DG Bone Density; Future  2. Hyperlipidemia -She should continue current treatment pending results of lab work - POCT CBC - [place], lipoprofile - Hepatic function panel  3. Mixed simple and mucopurulent chronic bronchitis -Continue regular follow-up with pulmonologist - POCT CBC - BMP8+EGFR  No orders of the defined types were placed in this encounter.   Patient Instructions  Continue current medications. Continue good therapeutic lifestyle changes which include good diet and exercise. Fall precautions discussed with  patient. If an FOBT was given today- please return it to our front desk. If you are over 89 years old - you may need Prevnar 19 or the adult Pneumonia vaccine.    After your visit with [place] today you will receive a survey in the mail or online from [employer] regarding your care with [place]. Please take a moment to fill this out. Your feedback is very important to [place] as you can help [place] better understand your patient needs as well as improve your experience and satisfaction. WE CARE ABOUT YOU!!!   We will call you with lab work results once those results become available Continue your current medicines Walk and exercise as much as possible If you decide to stop taking the Prilosec generic, you can switch to Zantac 150 twice daily before breakfast and supper We will arrange for you to have a DEXA scan in the office and please follow-up with your gynecologist for your pelvic exam and Pap smear   Arrie Senate MD

## 2015-06-03 LAB — BMP8+EGFR
BUN / CREAT RATIO: 13 (ref 9–23)
BUN: 8 mg/dL (ref 6–24)
CHLORIDE: 99 mmol/L (ref 97–108)
CO2: 23 mmol/L (ref 18–29)
CREATININE: 0.64 mg/dL (ref 0.57–1.00)
Calcium: 9.7 mg/dL (ref 8.7–10.2)
GFR calc Af Amer: 115 mL/min/{1.73_m2} (ref >59)
GFR calc non Af Amer: 99 mL/min/{1.73_m2} (ref >59)
Glucose: 99 mg/dL (ref 65–99)
Potassium: 4.8 mmol/L (ref 3.5–5.2)
Sodium: 142 mmol/L (ref 134–144)

## 2015-06-03 LAB — NMR, LIPOPROFILE
CHOLESTEROL: 198 mg/dL (ref 100–199)
HDL Cholesterol by NMR: 49 mg/dL (ref >39)
LDL-C: 111 mg/dL — ABNORMAL HIGH (ref 0–99)
TRIGLYCERIDES BY NMR: 188 mg/dL — AB (ref 0–149)

## 2015-06-03 LAB — VITAMIN D 25 HYDROXY (VIT D DEFICIENCY, FRACTURES): Vit D, 25-Hydroxy: 39 ng/mL (ref 30.0–100.0)

## 2015-06-03 LAB — HEPATIC FUNCTION PANEL
ALT: 23 IU/L (ref 0–32)
AST: 28 IU/L (ref 0–40)
Albumin: 4.3 g/dL (ref 3.5–5.5)
Alkaline Phosphatase: 141 IU/L — ABNORMAL HIGH (ref 39–117)
Bilirubin Total: 0.3 mg/dL (ref 0.0–1.2)
Bilirubin, Direct: 0.08 mg/dL (ref 0.00–0.40)
TOTAL PROTEIN: 7.4 g/dL (ref 6.0–8.5)

## 2015-06-21 ENCOUNTER — Telehealth: Payer: Self-pay | Admitting: Pharmacist

## 2015-06-21 NOTE — Telephone Encounter (Signed)
Patient called and appt made for 07/09/15 at [facility]

## 2015-07-02 ENCOUNTER — Ambulatory Visit: Payer: Self-pay

## 2015-07-02 ENCOUNTER — Other Ambulatory Visit: Payer: Self-pay

## 2015-07-05 ENCOUNTER — Ambulatory Visit: Payer: Medicare Other | Admitting: Internal Medicine

## 2015-07-09 ENCOUNTER — Encounter: Payer: Self-pay | Admitting: Pharmacist

## 2015-07-09 ENCOUNTER — Ambulatory Visit (INDEPENDENT_AMBULATORY_CARE_PROVIDER_SITE_OTHER): Payer: Medicare Other | Admitting: Pharmacist

## 2015-07-09 ENCOUNTER — Ambulatory Visit (INDEPENDENT_AMBULATORY_CARE_PROVIDER_SITE_OTHER): Payer: Medicare Other

## 2015-07-09 VITALS — BP 134/80 | HR 76 | Ht 64.75 in | Wt 186.0 lb

## 2015-07-09 DIAGNOSIS — Z0000 Encounter for general adult medical examination without abnormal findings: Secondary | ICD-10-CM | POA: Diagnosis not present

## 2015-07-09 DIAGNOSIS — M810 Age-related osteoporosis without current pathological fracture: Secondary | ICD-10-CM | POA: Diagnosis not present

## 2015-07-09 MED ORDER — ROSUVASTATIN CALCIUM 40 MG PO TABS: 20.0000 mg | ORAL_TABLET | Freq: Every day | ORAL | Status: DC

## 2015-07-09 NOTE — Patient Instructions (Addendum)
[name] , Thank you for taking time to come for your Medicare Wellness Visit. I appreciate your ongoing commitment to your health goals. Please review the following plan we discussed and let me know if I can assist you in the future.   These are the goals we discussed: Goals    . Exercise 4 times per week (30 min per time)     Continue to walk - great job!    . Weight < 180 lb (81.647 kg)       This is a list of the screening recommended for you and due dates:  Health Maintenance  Topic Date Due  .  Hepatitis C: One time screening is recommended by Center for Disease Control  (CDC) for  adults born from [birth year] through 1963.   [birth year]  . HIV Screening  05/20/1973  . Flu Shot  06/21/2015  . Mammogram  10/20/2015  . Stool Blood Test  01/02/2016  . Tetanus Vaccine  12/21/2016  . DEXA scan (bone density measurement)  07/08/2017  . Colon Cancer Screening  03/05/2023    Fall Prevention and Home Safety Falls cause injuries and can affect all age groups. It is possible to use preventive measures to significantly decrease the likelihood of falls. There are many simple measures which can make your home safer and prevent falls. OUTDOORS  Repair cracks and edges of walkways and driveways.  Remove high doorway thresholds.  Trim shrubbery on the main path into your home.  Have good outside lighting.  Clear walkways of tools, rocks, debris, and clutter.  Check that handrails are not broken and are securely fastened. Both sides of steps should have handrails.  Have leaves, snow, and ice cleared regularly.  Use sand or salt on walkways during winter months.  In the garage, clean up grease or oil spills. BATHROOM  Install night lights.  Install grab bars by the toilet and in the tub and shower.  Use non-skid mats or decals in the tub or shower.  Place a plastic non-slip stool in the shower to sit on, if needed.  Keep floors dry and clean up all water on the floor  immediately.  Remove soap buildup in the tub or shower on a regular basis.  Secure bath mats with non-slip, double-sided rug tape.  Remove throw rugs and tripping hazards from the floors. BEDROOMS  Install night lights.  Make sure a bedside light is easy to reach.  Do not use oversized bedding.  Keep a telephone by your bedside.  Have a firm chair with side arms to use for getting dressed.  Remove throw rugs and tripping hazards from the floor. KITCHEN  Keep handles on pots and pans turned toward the center of the stove. Use back burners when possible.  Clean up spills quickly and allow time for drying.  Avoid walking on wet floors.  Avoid hot utensils and knives.  Position shelves so they are not too high or low.  Place commonly used objects within easy reach.  If necessary, use a sturdy step stool with a grab bar when reaching.  Keep electrical cables out of the way.  Do not use floor polish or wax that makes floors slippery. If you must use wax, use non-skid floor wax.  Remove throw rugs and tripping hazards from the floor. STAIRWAYS  Never leave objects on stairs.  Place handrails on both sides of stairways and use them. Fix any loose handrails. Make sure handrails on both sides of the stairways  are as long as the stairs.  Check carpeting to make sure it is firmly attached along stairs. Make repairs to worn or loose carpet promptly.  Avoid placing throw rugs at the top or bottom of stairways, or properly secure the rug with carpet tape to prevent slippage. Get rid of throw rugs, if possible.  Have an electrician put in a light switch at the top and bottom of the stairs. OTHER FALL PREVENTION TIPS  Wear low-heel or rubber-soled shoes that are supportive and fit well. Wear closed toe shoes.  When using a stepladder, make sure it is fully opened and both spreaders are firmly locked. Do not climb a closed stepladder.  Add color or contrast paint or tape to  grab bars and handrails in your home. Place contrasting color strips on first and last steps.  Learn and use mobility aids as needed. Install an electrical emergency response system.  Turn on lights to avoid dark areas. Replace light bulbs that burn out immediately. Get light switches that glow.  Arrange furniture to create clear pathways. Keep furniture in the same place.  Firmly attach carpet with non-skid or double-sided tape.  Eliminate uneven floor surfaces.  Select a carpet pattern that does not visually hide the edge of steps.  Be aware of all pets. OTHER HOME SAFETY TIPS  Set the water temperature for 120 F (48.8 C).  Keep emergency numbers on or near the telephone.  Keep smoke detectors on every level of the home and near sleeping areas. Document Released: 10/27/2002 Document Revised: 05/07/2012 Document Reviewed: 01/26/2012 [facility] Patient Information 2015 [place], [place]. This information is not intended to replace advice given to you by your health care provider. Make sure you discuss any questions you have with your health care provider.  Calcium & Vitamin D: The Facts  Why is calcium and vitamin D consumption important? Calcium: . Most Americans do not consume adequate amounts of calcium! Calcium is required for proper muscle function, nerve communication, bone support, and many other functions in the body.  . The body uses bones as a source of calcium. Bones 'remodel' themselves continuously - the body constantly breaks bone down to release calcium and rebuilds bones by replacing calcium in the bone later.  . As we get older, the rate of bone breakdown occurs faster than bone rebuilding which could lead to osteopenia, osteoporosis, and possible fractures.   Vitamin D: . People naturally make vitamin D in the body when sunlight hits the skin and triggers a process that leads to vitamin D production. This natural vitamin D production requires about 10-15 minutes of  sun exposure on the hands, arms, and face at least 2-3 times per week. However, due to decreased sun exposure and the use of sunscreen, most people will need to get additional vitamin D from foods or supplements. Your doctor can measure your body's vitamin D level through a simple blood test to determine your daily vitamin D needs.  . Vitamin D is used to help the body absorb calcium, maintain bone health, help the immune system, and reduce inflammation. It also plays a role in muscle performance, balance and risk of falling.  . Vitamin D deficiency can lead to osteomalacia or softening of the bones, bone pain, and muscle weakness.   The recommended daily allowance of Calcium and Vitamin D varies for different age groups. Age group Calcium (mg) Vitamin D (IU)  Females and Males: Age 23-50 1000 mg 600 IU  Females: Age 62- 73 1200  mg 600 IU  Males: Age 45-70 1000 mg 600 IU  Females and Males: Age 64+ 1200 mg 800 IU  Pregnant/lactating Females age 84-50 1000 mg 600 IU   How much Calcium do you get in your diet? Calcium Intake # of servings per day  Total calcium (mg)  Skim milk, 2% milk (1 cup) _________ x 300 mg   Yogurt (1 small container) _________ x 200 mg   Cheese (1oz) _________ x 200 mg   Cottage Cheese (1 cup)             ________ x 150 mg   Almond milk (1 cup) _________ x 450 mg   Fortified Orange Juice (1 cup) _________ x 300 mg   Broccoli or spinach ( 1 cup) _________ x 100 mg   Salmon (3 oz) _________ x 150 mg    Almonds (1/4 cup) _______ x 90 mg      How do we get Calcium and Vitamin D in our diet? Calcium: . Obtaining calcium from the diet is the most preferred way to reach the recommended daily goal. If this goal is not reached through diet, calcium supplements are available.  . Calcium is found in many foods including: dairy products, dark leafy vegetables (like broccoli, kale, and spinach), fish, and fortified products like juices and cereals.  . The food label will have a  %DV (percent daily value) listed showing the amount of calcium per serving. To determine the total mg per serving, simply replace the % with zero (0).  For example, Almond Breeze almond milk contains 45% DV of calcium or '450mg'$  per 1 cup.  . You can increase the amount of calcium in your diet by using more calcium products in your daily meals. Use yogurt and fruit to make smoothies or use yogurt to top baked potatoes or make whipped potatoes. Sprinkle low fat cheese onto salads or into egg white omelets. You can even add non-fat dry milk powder ('300mg'$  calcium per 1/3 cup) to hot cereals, meat loaf, soups, or potatoes.  . Calcium supplements come in many forms including tablets, chewables, and gummies. Be sure to read the label to determine the correct number of tablets per serving and whether or not to take the supplement with food.  . Calcium carbonate products (Oscal, Caltrate, and Viactiv) are generally better absorbed when taken with food while calcium citrate products like Citracal can be taken with or without food.  . The body can only absorb about 600 mg of calcium at one time. It is recommended to take calcium supplements in small amounts several times per day.  However, taking it all at once is better than not taking it at all. . Increasing your intake of calcium is essential for bone health, but may also lead to some side effects like constipation, increased gas, bloating or abdominal cramping. To help reduce these side effects, start with 1 tablet per day and slowly increase your intake of the supplement to the recommended doses. It is also recommended that you drink plenty of water each day. Vitamin D: . Very few foods naturally contain vitamin D. However, it is found in saltwater fish (like tuna, salmon and mackerel), beef liver, egg yolks, cheese and vitamin D fortified foods (like yogurt, cereals, orange juice and milk) . The amount of vitamin D in each food or product is listed as %DV on the  product label. To determine the total amount of vitamin D per serving, drop the % sign and  multiply the number by 4. For example, 1 cup of Almond Breeze almond milk contains 25% DV vitamin D or 100 IU per serving (25 x 4 =100). . Vitamin D is also found in multivitamins and supplements and may be listed as ergocalciferol (vitamin D2) or cholecalciferol (vitamin D3). Each of these forms of vitamin D are equivalent and the daily recommended intake will vary based on your age and the vitamin D levels in your body. Follow your doctor's recommendation for vitamin D intake.

## 2015-07-09 NOTE — Progress Notes (Signed)
Patient ID: [name], female   DOB: [birth year], 59 y.o.   MRN: [id number]    Subjective:   [name] is a 59 y.o. female who presents for an Initial Medicare Annual Wellness Visit and follow up osteoporosis.  Patient WF who is disabled.  She is married.  I have seen patient in the past for osteoporosis.  She has tried Actonel and alendronate.  She was unable to tolerated either of these mediations (Actonel due to fluid retention and nausea and alendronate due to GI distress).  She has not broken any bones in the past. Has significant family history of osteoporosis (mother and sisters).  She has also tried several different forms of calcium in past but has been unable to tolerate calcium due to constipation.   Current Medications (verified) Outpatient Encounter Prescriptions as of 07/09/2015  Medication Sig  . albuterol (PROVENTIL) (2.5 MG/3ML) 0.083% nebulizer solution Take 2.5 mg by nebulization every 6 (six) hours as needed.  Marland Kitchen alpha-1-proteinase inhibitor, human, (ZEMAIRA) 1000 MG SOLR Inject 60 mg/kg into the vein once a week.   Marland Kitchen amitriptyline (ELAVIL) 75 MG tablet Take 75 mg by mouth at bedtime.    . CRESTOR 40 MG tablet TAKE (1) TABLET DAILY AS DIRECTED. (Patient taking differently: take 1/2 tablet daily)  . ezetimibe (ZETIA) 10 MG tablet Take 1 tablet (10 mg total) by mouth daily.  Marland Kitchen FLUoxetine (PROZAC) 20 MG capsule Take 20 mg by mouth daily.    . fluticasone (FLONASE) 50 MCG/ACT nasal spray USE 2 SPRAYS IN EACH NOSTRIL ONCE DAILY.  . furosemide (LASIX) 20 MG tablet 1 daily for occasional use - diuretic  . guaiFENesin (MUCINEX) 600 MG 12 hr tablet Take 1,200 mg by mouth 2 (two) times daily.    Marland Kitchen HYDROcodone-acetaminophen (NORCO/VICODIN) 5-325 MG per tablet Take 1 tablet by mouth 3 (three) times daily.  . Nebulizers (COMPRESSOR NEBULIZER) MISC 1 Device by Does not apply route as needed.  Marland Kitchen omeprazole (PRILOSEC) 40 MG capsule TAKE (1) CAPSULE DAILY  . PROAIR HFA 108 (90  BASE) MCG/ACT inhaler 2 PUFFS EVERY 4 HOURS AS NEEDED FOR WHEEZING  . SPIRIVA HANDIHALER 18 MCG inhalation capsule INHALE 1 CAPSULE DAILY  . SYMBICORT 80-4.5 MCG/ACT inhaler 2 PUFFS 2 TIMES A DAY  . [DISCONTINUED] Nebulizers (COMPRESSOR/NEBULIZER) MISC Use as directed   No facility-administered encounter medications on file as of 07/09/2015.    Allergies (verified) Penicillins; Actonel; Advair diskus; Alendronate sodium; Aspirin; Levofloxacin; Morphine; Nsaids; Omnicef; Erythromycin; and Iodine   History: Past Medical History  Diagnosis Date  . Alpha-1-antitrypsin deficiency   . COPD (chronic obstructive pulmonary disease)   . Hyperlipidemia   . Fibromyalgia   . GERD (gastroesophageal reflux disease)   . IBS (irritable bowel syndrome)   . Vitamin D deficiency   . Osteoporosis   . Chronic bronchitis with COPD (chronic obstructive pulmonary disease)   . Fibromyalgia   . Cervical cancer   . Cervical cancer 2004   Past Surgical History  Procedure Laterality Date  . Lumbar disc surgery    . Radical hysterectomy    . Abdominal hysterectomy     Family History  Problem Relation Age of Onset  . Alpha-1 antitrypsin deficiency Sister   . Osteoporosis Sister   . Pancreatic cancer Mother   . Osteoporosis Mother   . Irregular heart beat Mother   . Colon cancer Neg Hx   . Heart attack Father 6  . Migraines Father   . CAD Sister   .  Osteoporosis Sister   . Lung cancer Brother   . CAD Brother   . Alpha-1 antitrypsin deficiency Other    Social History   Occupational History  . disabled    Social History Main Topics  . Smoking status: Former Smoker -- 0.50 packs/day    Types: Cigarettes    Start date: 11/20/1972    Quit date: 04/01/2012  . Smokeless tobacco: Never Used  . Alcohol Use: No  . Drug Use: No  . Sexual Activity: Yes    Do you feel safe at home?  Yes  Dietary issues and exercise activities: Current Exercise Habits:: Home exercise routine, Type of exercise:  treadmill, Time (Minutes): 20, Frequency (Times/Week): 4, Weekly Exercise (Minutes/Week): 80, Intensity: Moderate  Current Dietary habits:  Tried to make effort to increase calcium rich foods such as greens and broccoli.   Objective:    Today's Vitals   07/09/15 0959  BP: 134/80  Pulse: 76  Height: 5' 4.75" (1.645 m)  Weight: 186 lb (84.369 kg)   Body mass index is 31.18 kg/(m^2).   DEXA Results Date of Test T-Score for AP Spine L1-L4 T-Score for Total Left Hip T-Score for Total Right Hip  07/09/2015 -1.4 -2.2 -1.5  06/25/2013 -1.5 -2.0 -1.9  06/22/2010 -1.1 -2.1 -1.6  04/29/2008 -1.1 -1.7 -1.5              Lowest T-Score = -2.7 at neck of left hip (from today's DEXA)  Activities of Daily Living In your present state of health, do you have any difficulty performing the following activities: 07/09/2015  Hearing? N  Vision? N  Difficulty concentrating or making decisions? N  Walking or climbing stairs? N  Dressing or bathing? N  Doing errands, shopping? N  Preparing Food and eating ? N  Using the Toilet? N  In the past six months, have you accidently leaked urine? N  Do you have problems with loss of bowel control? N  Managing your Medications? N  Managing your Finances? N  Housekeeping or managing your Housekeeping? N    Are there smokers in your home (other than you)? No   Cardiac Risk Factors include: advanced age (>11mn, >>4women);family history of premature cardiovascular disease;obesity (BMI >30kg/m2)  Depression Screen PHQ 2/9 Scores 07/09/2015 06/01/2015 02/01/2015 09/28/2014  PHQ - 2 Score 0 0 0 0    Fall Risk Fall Risk  07/09/2015 06/01/2015 02/01/2015 09/28/2014 05/26/2014  Falls in the past year? No No No No No    Cognitive Function: MMSE - Mini Mental State Exam 07/09/2015  Orientation to time 5  Orientation to Place 5  Registration 3  Attention/ Calculation 5  Recall 3  Language- name 2 objects 2  Language- repeat 1  Language-  follow 3 step command 3  Language- read & follow direction 1  Write a sentence 1  Copy design 1  Total score 30    Immunizations and Health Maintenance Immunization History  Administered Date(s) Administered  . Influenza Split 09/21/2011  . Influenza Whole 09/15/2010  . Influenza,inj,Quad PF,36+ Mos 10/06/2013  . Influenza,inj,quad, With Preservative 09/28/2014   Health Maintenance Due  Topic Date Due  . Hepatitis C Screening  01959-09-20 . HIV Screening  05/20/1973  . INFLUENZA VACCINE  06/21/2015    Patient Care Team: DChipper Herb MD as PCP - General (Family Medicine) CDeneise Lever MD (Pulmonary Disease) JLeigh Aurora MD (Rheumatology) YAdelina MingsHMargret Chance MD as Referring Physician (Optometry)  Indicate any recent Medical Services  you may have received from other than [facility] providers in the past year (date may be approximate).    Assessment:    Annual Wellness Visit  Osteoporosis - stable  Screening Tests Health Maintenance  Topic Date Due  . Hepatitis C Screening  1955/12/26  . HIV Screening  05/20/1973  . INFLUENZA VACCINE  06/21/2015  . MAMMOGRAM  10/20/2015  . COLON CANCER SCREENING ANNUAL FOBT  01/02/2016  . TETANUS/TDAP  12/21/2016  . DEXA SCAN  07/08/2017  . COLONOSCOPY  03/05/2023        Plan:   During the course of the visit Ayvah was educated and counseled about the following appropriate screening and preventive services:   Vaccines to include Pneumoccal, Influenza, Hepatitis B, Td, Zostavax, - UTD.  Reminded about influenza in fall.  Colorectal cancer screening - UTD  Cardiovascular disease screening - UTD  Diabetes screening - UTD  Bone Denisty / Osteoporosis Screening - Done today  Fall risk discussed with patient and also discussed ways to decrease falls.   Continue to eat calcium rich foods - list given and reviewed with patient.  Recommend '1200mg'$  daily.  Also discussed limiting calorie intake and serving sizes for weight  loss  Discussed both Prolia and Evista for osetoporosis.  Patient declined medcation at this time.   Mammogram - appt made for 11/16/2015  Continue walking daily - great job!  PAP - UTD  Glaucoma screening / Diabetic Eye Exam - UTD  Advanced Directives - packet given.    Goals    . Exercise 4 times per week (30 min per time)     Continue to walk - great job!    . Weight < 180 lb (81.647 kg)        Patient Instructions (the written plan) were given to the patient.   Cherre Robins, [facility]   07/09/2015

## 2015-07-27 ENCOUNTER — Other Ambulatory Visit: Payer: Self-pay | Admitting: Family Medicine

## 2015-08-23 DIAGNOSIS — D2371 Other benign neoplasm of skin of right lower limb, including hip: Secondary | ICD-10-CM | POA: Diagnosis not present

## 2015-08-23 DIAGNOSIS — D2372 Other benign neoplasm of skin of left lower limb, including hip: Secondary | ICD-10-CM | POA: Diagnosis not present

## 2015-08-23 DIAGNOSIS — M79609 Pain in unspecified limb: Secondary | ICD-10-CM | POA: Diagnosis not present

## 2015-08-27 ENCOUNTER — Other Ambulatory Visit: Payer: Self-pay | Admitting: Nurse Practitioner

## 2015-09-06 DIAGNOSIS — M2011 Hallux valgus (acquired), right foot: Secondary | ICD-10-CM | POA: Diagnosis not present

## 2015-09-06 DIAGNOSIS — D2371 Other benign neoplasm of skin of right lower limb, including hip: Secondary | ICD-10-CM | POA: Diagnosis not present

## 2015-09-06 DIAGNOSIS — M216X2 Other acquired deformities of left foot: Secondary | ICD-10-CM | POA: Diagnosis not present

## 2015-09-06 DIAGNOSIS — D2372 Other benign neoplasm of skin of left lower limb, including hip: Secondary | ICD-10-CM | POA: Diagnosis not present

## 2015-09-14 DIAGNOSIS — C539 Malignant neoplasm of cervix uteri, unspecified: Secondary | ICD-10-CM | POA: Diagnosis not present

## 2015-09-14 DIAGNOSIS — Z1151 Encounter for screening for human papillomavirus (HPV): Secondary | ICD-10-CM | POA: Diagnosis not present

## 2015-09-14 DIAGNOSIS — Z01419 Encounter for gynecological examination (general) (routine) without abnormal findings: Secondary | ICD-10-CM | POA: Diagnosis not present

## 2015-09-14 DIAGNOSIS — B977 Papillomavirus as the cause of diseases classified elsewhere: Secondary | ICD-10-CM | POA: Diagnosis not present

## 2015-09-14 DIAGNOSIS — R87811 Vaginal high risk human papillomavirus (HPV) DNA test positive: Secondary | ICD-10-CM | POA: Diagnosis not present

## 2015-09-20 DIAGNOSIS — M722 Plantar fascial fibromatosis: Secondary | ICD-10-CM | POA: Diagnosis not present

## 2015-09-20 DIAGNOSIS — M216X2 Other acquired deformities of left foot: Secondary | ICD-10-CM | POA: Diagnosis not present

## 2015-09-20 DIAGNOSIS — M2012 Hallux valgus (acquired), left foot: Secondary | ICD-10-CM | POA: Diagnosis not present

## 2015-09-23 DIAGNOSIS — M25512 Pain in left shoulder: Secondary | ICD-10-CM | POA: Diagnosis not present

## 2015-09-23 DIAGNOSIS — M797 Fibromyalgia: Secondary | ICD-10-CM | POA: Diagnosis not present

## 2015-09-23 DIAGNOSIS — M810 Age-related osteoporosis without current pathological fracture: Secondary | ICD-10-CM | POA: Diagnosis not present

## 2015-09-23 DIAGNOSIS — M150 Primary generalized (osteo)arthritis: Secondary | ICD-10-CM | POA: Diagnosis not present

## 2015-09-27 ENCOUNTER — Ambulatory Visit (INDEPENDENT_AMBULATORY_CARE_PROVIDER_SITE_OTHER): Payer: Medicare Other | Admitting: Family Medicine

## 2015-09-27 ENCOUNTER — Encounter: Payer: Self-pay | Admitting: Family Medicine

## 2015-09-27 VITALS — BP 130/78 | HR 79 | Temp 97.7°F | Ht 64.75 in | Wt 185.0 lb

## 2015-09-27 DIAGNOSIS — J42 Unspecified chronic bronchitis: Secondary | ICD-10-CM

## 2015-09-27 DIAGNOSIS — M797 Fibromyalgia: Secondary | ICD-10-CM | POA: Diagnosis not present

## 2015-09-27 DIAGNOSIS — E559 Vitamin D deficiency, unspecified: Secondary | ICD-10-CM | POA: Diagnosis not present

## 2015-09-27 DIAGNOSIS — J449 Chronic obstructive pulmonary disease, unspecified: Secondary | ICD-10-CM | POA: Diagnosis not present

## 2015-09-27 DIAGNOSIS — E785 Hyperlipidemia, unspecified: Secondary | ICD-10-CM

## 2015-09-27 DIAGNOSIS — Z23 Encounter for immunization: Secondary | ICD-10-CM | POA: Diagnosis not present

## 2015-09-27 DIAGNOSIS — E8801 Alpha-1-antitrypsin deficiency: Secondary | ICD-10-CM

## 2015-09-27 NOTE — Progress Notes (Signed)
Subjective:    Patient ID: [name], female    DOB: [birth year], 59 y.o.   MRN: [id number]  HPI Pt here for follow up and management of chronic medical problems which includes hyperlipidemia. She is taking medications regularly. The patient sees her pulmonologist regularly because of her alpha 1 antitrypsin deficiency. She has been stable. She will get her flu vaccine today we'll get lab work done today. The patient is doing as well as I have seen her recently. She says that she is exercising regularly and continues to use hand gels to keep herself protected from picking up any kind of infection. She denies chest pain. She has no more shortness of breath than usual. She does not have any heartburn or indigestion and less she eats something that would cause this. She has no trouble swallowing and there is no blood in the stool or black tarry bowel movements. She is passing her water without problems. She has her appointment with the pulmonologist the end of this week. She is passing her water without problems. She is recently started back on her Flonase.       Patient Active Problem List   Diagnosis Date Noted  . Hyperlipidemia 10/06/2013  . Osteoporosis 06/25/2013  . Rhinitis, nonallergic 05/08/2012  . TOBACCO USER 11/22/2010  . DYSPNEA 06/24/2008  . SLEEP APNEA 06/02/2008  . Alpha-1-antitrypsin deficiency ([place]) 12/06/2007  . COPD (chronic obstructive pulmonary disease) ([place]) 09/14/2007  . Fibromyalgia 09/14/2007   Outpatient Encounter Prescriptions as of 09/27/2015  Medication Sig  . albuterol (PROVENTIL) (2.5 MG/3ML) 0.083% nebulizer solution Take 2.5 mg by nebulization every 6 (six) hours as needed.  Marland Kitchen alpha-1-proteinase inhibitor, human, (ZEMAIRA) 1000 MG SOLR Inject 60 mg/kg into the vein once a week.   Marland Kitchen amitriptyline (ELAVIL) 75 MG tablet Take 75 mg by mouth at bedtime.    Marland Kitchen FLUoxetine (PROZAC) 20 MG capsule Take 20 mg by mouth daily.    . fluticasone (FLONASE) 50 MCG/ACT  nasal spray USE 2 SPRAYS IN EACH NOSTRIL ONCE DAILY.  . furosemide (LASIX) 20 MG tablet 1 daily for occasional use - diuretic  . guaiFENesin (MUCINEX) 600 MG 12 hr tablet Take 1,200 mg by mouth 2 (two) times daily.    Marland Kitchen HYDROcodone-acetaminophen (NORCO/VICODIN) 5-325 MG per tablet Take 1 tablet by mouth 3 (three) times daily.  . Nebulizers (COMPRESSOR NEBULIZER) MISC 1 Device by Does not apply route as needed.  Marland Kitchen omeprazole (PRILOSEC) 40 MG capsule TAKE (1) CAPSULE DAILY  . PROAIR HFA 108 (90 BASE) MCG/ACT inhaler 2 PUFFS EVERY 4 HOURS AS NEEDED FOR WHEEZING  . rosuvastatin (CRESTOR) 40 MG tablet Take 0.5 tablets (20 mg total) by mouth daily.  Marland Kitchen SPIRIVA HANDIHALER 18 MCG inhalation capsule INHALE 1 CAPSULE DAILY  . SYMBICORT 80-4.5 MCG/ACT inhaler 2 PUFFS 2 TIMES A DAY  . ZETIA 10 MG tablet TAKE 1 TABLET ONCE A DAY  . [DISCONTINUED] ezetimibe (ZETIA) 10 MG tablet Take 1 tablet (10 mg total) by mouth daily.   No facility-administered encounter medications on file as of 09/27/2015.     Review of Systems  Constitutional: Negative.   HENT: Negative.   Eyes: Negative.   Respiratory: Negative.   Cardiovascular: Negative.   Gastrointestinal: Negative.   Endocrine: Negative.   Genitourinary: Negative.   Musculoskeletal: Negative.   Skin: Negative.   Allergic/Immunologic: Negative.   Neurological: Negative.   Hematological: Negative.   Psychiatric/Behavioral: Negative.        Objective:   Physical Exam  Constitutional: She is oriented to person, place, and time. She appears well-developed and well-nourished. No distress.  Pleasant and alert  HENT:  Head: Normocephalic.  Right Ear: External ear normal.  Left Ear: External ear normal.  Mouth/Throat: Oropharynx is clear and moist.  Nasal congestion and turbinate swelling bilaterally left greater than right  Eyes: Conjunctivae and EOM are normal. Pupils are equal, round, and reactive to light. Right eye exhibits no discharge. Left eye  exhibits no discharge. No scleral icterus.  Neck: Normal range of motion. Neck supple. No thyromegaly present.  No carotid bruits thyromegaly or anterior cervical adenopathy  Cardiovascular: Normal rate, regular rhythm, normal heart sounds and intact distal pulses.   No murmur heard. The heart has a regular rate and rhythm at 84/m  Pulmonary/Chest: Effort normal. No respiratory distress. She has no wheezes. She has no rales. She exhibits no tenderness.  The lungs have a few crackles bilaterally but subjectively the scene less than in the past. The patient confirms that she is actually been feeling better recently 2.  Abdominal: Soft. Bowel sounds are normal. She exhibits no mass. There is no tenderness. There is no rebound and no guarding.  No masses tenderness or organ enlargement  Musculoskeletal: Normal range of motion. She exhibits no edema.  Lymphadenopathy:    She has no cervical adenopathy.  Neurological: She is alert and oriented to person, place, and time. She has normal reflexes. No cranial nerve deficit.  Skin: Skin is warm and dry. No rash noted.  Psychiatric: She has a normal mood and affect. Her behavior is normal. Judgment and thought content normal.  Nursing note and vitals reviewed.  BP 130/78 mmHg  Pulse 79  Temp(Src) 97.7 F (36.5 C) (Oral)  Ht 5' 4.75" (1.645 m)  Wt 185 lb (83.915 kg)  BMI 31.01 kg/m2         Assessment & Plan:  1. Hyperlipidemia - Continue current treatment pending lab results - BMP8+EGFR - CBC with Differential/Platelet - Hepatic function panel - NMR, lipoprofile  2. Vitamin D deficiency -continue current treatment pending results of labs - CBC with Differential/Platelet - Vit D  25 hydroxy (rtn osteoporosis monitoring)  3. Chronic obstructive pulmonary disease, unspecified COPD, unspecified chronic bronchitis type -Follow up with pulmonology - CBC with Differential/Platelet  4. Fibromyalgia -Continue current treatment - CBC with  Differential/Platelet  5. Alpha-1-antitrypsin deficiency ([place]) -Follow up with pulmonology  6. Chronic bronchitis, unspecified chronic bronchitis type ([place]) -Follow up with pulmonology. In this winter drink plenty of fluids stay well hydrated and keep the house as cool as possible  Patient Instructions  Continue current medications. Continue good therapeutic lifestyle changes which include good diet and exercise. Fall precautions discussed with patient. If an FOBT was given today- please return it to our front desk. If you are over 78 years old - you may need Prevnar 67 or the adult Pneumonia vaccine.  **Flu shots are available--- please call and schedule a FLU-CLINIC appointment**  After your visit with [place] today you will receive a survey in the mail or online from [employer] regarding your care with [place]. Please take a moment to fill this out. Your feedback is very important to [place] as you can help [place] better understand your patient needs as well as improve your experience and satisfaction. WE CARE ABOUT YOU!!!   The patient should continue to use good hand hygiene especially if out going to the grocery store or out in public. She should screenpeople come and visit  her at home. This winter she should drink plenty of fluids and keep the house as cool as possible. She should resume using her Flonase nasal spray 1 spray each nostril at bedtime. She should also use nasal saline frequently during the day. She should use Mucinex maximum strength, blue and white in color, 1 twice daily for cough and congestion with a large glass of water as needed. She should follow-up with her pulmonologist as planned.   Arrie Senate MD

## 2015-09-27 NOTE — Patient Instructions (Addendum)
Continue current medications. Continue good therapeutic lifestyle changes which include good diet and exercise. Fall precautions discussed with patient. If an FOBT was given today- please return it to our front desk. If you are over 57 years old - you may need Prevnar 39 or the adult Pneumonia vaccine.  **Flu shots are available--- please call and schedule a FLU-CLINIC appointment**  After your visit with [place] today you will receive a survey in the mail or online from [employer] regarding your care with [place]. Please take a moment to fill this out. Your feedback is very important to [place] as you can help [place] better understand your patient needs as well as improve your experience and satisfaction. WE CARE ABOUT YOU!!!   The patient should continue to use good hand hygiene especially if out going to the grocery store or out in public. She should screenpeople come and visit her at home. This winter she should drink plenty of fluids and keep the house as cool as possible. She should resume using her Flonase nasal spray 1 spray each nostril at bedtime. She should also use nasal saline frequently during the day. She should use Mucinex maximum strength, blue and white in color, 1 twice daily for cough and congestion with a large glass of water as needed. She should follow-up with her pulmonologist as planned.

## 2015-09-28 LAB — CBC WITH DIFFERENTIAL/PLATELET
BASOS ABS: 0.1 10*3/uL (ref 0.0–0.2)
Basos: 1 %
EOS (ABSOLUTE): 0.1 10*3/uL (ref 0.0–0.4)
Eos: 1 %
Hematocrit: 45.1 % (ref 34.0–46.6)
Hemoglobin: 14.9 g/dL (ref 11.1–15.9)
IMMATURE GRANS (ABS): 0 10*3/uL (ref 0.0–0.1)
IMMATURE GRANULOCYTES: 0 %
LYMPHS: 45 %
Lymphocytes Absolute: 3.6 10*3/uL — ABNORMAL HIGH (ref 0.7–3.1)
MCH: 30 pg (ref 26.6–33.0)
MCHC: 33 g/dL (ref 31.5–35.7)
MCV: 91 fL (ref 79–97)
Monocytes Absolute: 0.6 10*3/uL (ref 0.1–0.9)
Monocytes: 8 %
NEUTROS ABS: 3.7 10*3/uL (ref 1.4–7.0)
NEUTROS PCT: 45 %
PLATELETS: 292 10*3/uL (ref 150–379)
RBC: 4.96 x10E6/uL (ref 3.77–5.28)
RDW: 14.7 % (ref 12.3–15.4)
WBC: 8.1 10*3/uL (ref 3.4–10.8)

## 2015-09-28 LAB — HEPATIC FUNCTION PANEL
ALT: 22 IU/L (ref 0–32)
AST: 29 IU/L (ref 0–40)
Albumin: 4.3 g/dL (ref 3.5–5.5)
Alkaline Phosphatase: 133 IU/L — ABNORMAL HIGH (ref 39–117)
BILIRUBIN, DIRECT: 0.07 mg/dL (ref 0.00–0.40)
Bilirubin Total: 0.2 mg/dL (ref 0.0–1.2)
TOTAL PROTEIN: 7.6 g/dL (ref 6.0–8.5)

## 2015-09-28 LAB — NMR, LIPOPROFILE
CHOLESTEROL: 204 mg/dL — AB (ref 100–199)
HDL CHOLESTEROL BY NMR: 53 mg/dL (ref >39)
HDL Particle Number: 37.9 umol/L (ref >=30.5)
LDL PARTICLE NUMBER: 1458 nmol/L — AB (ref <1000)
LDL SIZE: 21.3 nm (ref >20.5)
LDL-C: 111 mg/dL — ABNORMAL HIGH (ref 0–99)
LP-IR Score: 62 — ABNORMAL HIGH (ref <=45)
Small LDL Particle Number: 617 nmol/L — ABNORMAL HIGH (ref <=527)
TRIGLYCERIDES BY NMR: 198 mg/dL — AB (ref 0–149)

## 2015-09-28 LAB — BMP8+EGFR
BUN/Creatinine Ratio: 13 (ref 9–23)
BUN: 9 mg/dL (ref 6–24)
CALCIUM: 9.5 mg/dL (ref 8.7–10.2)
CHLORIDE: 97 mmol/L (ref 97–106)
CO2: 29 mmol/L (ref 18–29)
Creatinine, Ser: 0.69 mg/dL (ref 0.57–1.00)
GFR calc Af Amer: 112 mL/min/{1.73_m2} (ref >59)
GFR calc non Af Amer: 97 mL/min/{1.73_m2} (ref >59)
GLUCOSE: 91 mg/dL (ref 65–99)
POTASSIUM: 4.6 mmol/L (ref 3.5–5.2)
Sodium: 138 mmol/L (ref 136–144)

## 2015-09-28 LAB — VITAMIN D 25 HYDROXY (VIT D DEFICIENCY, FRACTURES): Vit D, 25-Hydroxy: 51.2 ng/mL (ref 30.0–100.0)

## 2015-09-29 ENCOUNTER — Ambulatory Visit (INDEPENDENT_AMBULATORY_CARE_PROVIDER_SITE_OTHER): Payer: Medicare Other | Admitting: Internal Medicine

## 2015-09-29 ENCOUNTER — Encounter: Payer: Self-pay | Admitting: Internal Medicine

## 2015-09-29 VITALS — BP 120/74 | HR 84 | Ht 65.0 in | Wt 186.6 lb

## 2015-09-29 DIAGNOSIS — E8801 Alpha-1-antitrypsin deficiency: Secondary | ICD-10-CM | POA: Diagnosis not present

## 2015-09-29 DIAGNOSIS — Z720 Tobacco use: Secondary | ICD-10-CM | POA: Diagnosis not present

## 2015-09-29 DIAGNOSIS — G4733 Obstructive sleep apnea (adult) (pediatric): Secondary | ICD-10-CM

## 2015-09-29 DIAGNOSIS — J449 Chronic obstructive pulmonary disease, unspecified: Secondary | ICD-10-CM | POA: Diagnosis not present

## 2015-09-29 DIAGNOSIS — Z79899 Other long term (current) drug therapy: Secondary | ICD-10-CM | POA: Diagnosis not present

## 2015-09-29 NOTE — Assessment & Plan Note (Signed)
Very minimal obstructive apnea. Encouraged weight loss and sleep off flat of back

## 2015-09-29 NOTE — Patient Instructions (Signed)
We can continue present meds and the Zemaira   Dr Laurance Flatten will keep your vaccines up to date

## 2015-09-29 NOTE — Progress Notes (Signed)
Patient ID: [name], female    DOB: Jul 22, 1956, 59 y.o.   MRN: [id number]  HPI 05/22/11- 64 yoF  smoker on replacement for a1AT deficiency, with severe COPD, complicated by OSA and fibromyalgia. Last here November 22, 2010. Still smoking 1/3 ppd. We discussed her mood changes with Chantix. She has never tried patches.  Coughing more in hot weather, around strong smells. Short of breath may be a little worse over time, especially if carrying something. Denies chest pain or palpitation. Feet and legs swell some and she notes nocturia x 2-3.   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 Husband is here. She has had flu and pneumonia vaccines. Acute visit-new respiratory infection started about 2 days ago with dry cough, fever to 100 and 101, no chills, frontal headache, nasal congestion. She denies rash, nodes, GI upset or myalgias/arthralgias. She had called our on-call physician yesterday is going to sent Levaquin but her drugstore was already closed. A couple of grandchildren have been recently ill with viral syndromes.   05/02/12- 38 yoF former smoker on replacement for a1AT deficiency, with severe COPD, complicated by OSA and fibromyalgia. Says she quit smoking 04/01/12 !  Here with granddaughter.  Her COPD assessment test(CAT) score today 17/40. Or treatment cleared her bronchitis symptoms in December. In April she got bronchitis again treated by her primary physician with a home nebulizer/albuterol, used twice a day. Chronic productive cough, usually  white. CXR 11/02/11-reviewed with her IMPRESSION:  There is no evidence of acute cardiac or pulmonary process.  Original Report Authenticated By: Duayne Cal, M.D.  Dr. Laurance Flatten did a chest x-ray 03/19/2012-COPD with emphysema and bronchitis changes. She says Advair caused edema. She continues alpha replacement Zemaira/ [facility].  11/05/12-  89 yoF former smoker on  replacement for a1AT deficiency, with severe COPD, complicated by OSA and fibromyalgia. FOLLOWS FOR: doing good overall; no more than usual SOB, wheezing ,cough, or congestion    Daughter here Had large local reaction with heat and rash after flu shot last year( Fluzone) so we discussed different brands. She continues on Zemaira IV/ [facility]. replacement therapy for alpha-1 antitrypsin deficiency CXR 03/19/12- recommended follow-up attention to ? Early fibrotic changes   05/06/13-  55 yoF former smoker on replacement for a1AT deficiency, with severe COPD, complicated by OSA and fibromyalgia. Zemaira alpha 1 replacement. FOLLOWS FOR: review PFT results with patient, no more than usual of SOB. She continues her alpha 1 replacement intravenously each week with no problem. Some productive cough with clear sputum. Flonase worked well in the spring for rhinitis. Still noticing dyspnea on exertion especially if carrying a weight. Symbicort helps a lot. PFT- 05/06/2013-reduced FVC may reflect effort but the overall pattern is moderate obstructive airways disease with response to bronchodilator, air trapping, normal diffusion. This doesn't fit entirely with emphysema.  CXR 11/28/12 IMPRESSION:  No acute cardiopulmonary disease. Findings consistent with COPD.  No change from the prior study.  Original Report Authenticated By: Lajean Manes, M.D.  11/04/13- 12 yoF former smoker on replacement for a1AT deficiency, with severe COPD, complicated by OSA and fibromyalgia. Zemaira alpha 1 replacement.  Husband here Follows for- 6 month rov.  Pt c/o SOB with exertion, no other complaints.  Zemaira/ [facility] Baseline the congested cough but doing well. Discussed her vaccine status.  05/05/14- 24 yoF former smoker on replacement for a1AT deficiency, with severe COPD, complicated by OSA and fibromyalgia. Zemaira  alpha 1 replacement.  Husband here FOLLOWS FVC:BSWHQPRFFM flare ups-staying out of the heat.  She is avoiding  the summer heat by staying in air conditioned areas. No acute events. Cough is usually productive. Says her nebulizer machine is worn out and asks if we can replace it  01/01/15 47 yoF former smoker on replacement for a1AT deficiency, with severe COPD, complicated by OSA and fibromyalgia. Zemaira alpha 1 replacement.  Husband here Dr. Laurance Flatten keeps her pneumonia vaccine up-to-date. She lost prescription from last visit for replacement nebulizer machine and asks up-to-date. Scant sputum is usually clear. No acute event CXR 05/05/14- IMPRESSION: COPD. There is no acute cardiopulmonary disease. Electronically Signed  By: David Martinique  On: 05/05/2014 11:36  09/29/15- 59 year old female former smoker on replacement for alpha-1 AT deficiency with severe COPD, complicated by OSA and fibromyalgia. Zemaira alpha 1 replacement. FOLLOWS FOR: Pt states she is having more SOB than in past otherwise things remain the same. PFT in 2014 showed moderate obstructive airways disease with normal diffusion Husband here Can walk 1 mile on treadmill but more trouble with stairs or carrying a load. Dr. Laurance Flatten manages her vaccinations. CXR 05/05/14 IMPRESSION: COPD. There is no acute cardiopulmonary disease. Electronically Signed  By: David Martinique  On: 05/05/2014 11:36  ROS-see HPI Constitutional:   No-   weight loss, night sweats, fevers, chills, fatigue, lassitude. HEENT:   No-  headaches, difficulty swallowing, tooth/dental problems, sore throat,       No-  sneezing, itching, ear ache, nasal congestion, post nasal drip,  CV:  No-   chest pain, orthopnea, PND, swelling in lower extremities, anasarca, dizziness, palpitations Resp: +shortness of breath with exertion or at rest.              No-productive cough,  No non-productive cough,  No- coughing up of blood.              No-change in color of mucus.  No- wheezing.   Skin: No-   rash or lesions. GI:  No-   heartburn, indigestion, abdominal pain,  nausea, vomiting, GU:  MS:  No-   joint pain or swelling.   Neuro-     nothing unusual Psych:  No- change in mood or affect. No depression or anxiety.  No memory loss.   Objective:   Physical Exam General- Alert, Oriented, Affect-appropriate, Distress- none acute, obese Skin- rash-none, lesions- none, excoriation- none Lymphadenopathy- none Head- atraumatic            Eyes- Gross vision intact, PERRLA, conjunctivae clear secretions            Ears- Hearing, canals-normal            Nose- congested, no-Septal dev, mucus, polyps, erosion, perforation             Throat- Mallampati II , mucosa clear , drainage- none,                              tonsils- atrophic, +dentures Neck- flexible , trachea midline, no stridor , thyroid nl, carotid no bruit Chest - symmetrical excursion , unlabored           Heart/CV- RRR , no murmur , no gallop  , no rub, nl s1 s2                           - JVD- none , edema- none, stasis changes-  none, varices- none           Lung- cough+ light , +bilateral rhonchi, dullness-none, rub- none. Unlabored           Chest wall-  Abd-  Br/ Gen/ Rectal- Not done, not indicated Extrem- cyanosis- none, clubbing, none, atrophy- none, strength- nl Neuro- grossly intact to observation

## 2015-09-29 NOTE — Assessment & Plan Note (Signed)
No obvious acute change. I discussed exercise for fitness as a way of dealing with dyspnea on exertion and encouraged her to continue her treadmill. Does not need medication refills at this visit.

## 2015-09-29 NOTE — Assessment & Plan Note (Signed)
Has remained off of cigarettes but several family members smoke

## 2015-09-29 NOTE — Assessment & Plan Note (Signed)
She continues weekly protease inhibitor infusion

## 2015-10-05 DIAGNOSIS — M2012 Hallux valgus (acquired), left foot: Secondary | ICD-10-CM | POA: Diagnosis not present

## 2015-10-05 DIAGNOSIS — M2010 Hallux valgus (acquired), unspecified foot: Secondary | ICD-10-CM | POA: Diagnosis not present

## 2015-10-05 DIAGNOSIS — M216X2 Other acquired deformities of left foot: Secondary | ICD-10-CM | POA: Diagnosis not present

## 2015-10-07 ENCOUNTER — Ambulatory Visit: Payer: Self-pay | Admitting: Family Medicine

## 2015-10-08 ENCOUNTER — Ambulatory Visit: Payer: Medicare Other | Admitting: Internal Medicine

## 2015-10-08 DIAGNOSIS — M25572 Pain in left ankle and joints of left foot: Secondary | ICD-10-CM | POA: Diagnosis not present

## 2015-10-19 DIAGNOSIS — R87622 Low grade squamous intraepithelial lesion on cytologic smear of vagina (LGSIL): Secondary | ICD-10-CM | POA: Diagnosis not present

## 2015-10-19 DIAGNOSIS — R87612 Low grade squamous intraepithelial lesion on cytologic smear of cervix (LGSIL): Secondary | ICD-10-CM | POA: Diagnosis not present

## 2015-10-20 DIAGNOSIS — M25572 Pain in left ankle and joints of left foot: Secondary | ICD-10-CM | POA: Diagnosis not present

## 2015-11-03 ENCOUNTER — Telehealth: Payer: Self-pay | Admitting: Internal Medicine

## 2015-11-03 NOTE — Telephone Encounter (Signed)
Spoke with [place] inquiring whether we received forms faxed on 10/18/15 for pt's zemaria rx Informed pharmacy that i would route message to [facility] nurse to see if she still has forms to submit  Katie, please advise.

## 2015-11-04 DIAGNOSIS — M25572 Pain in left ankle and joints of left foot: Secondary | ICD-10-CM | POA: Diagnosis not present

## 2015-11-04 NOTE — Telephone Encounter (Signed)
Fax was sent back on 11/03/15

## 2015-11-19 DIAGNOSIS — Z1231 Encounter for screening mammogram for malignant neoplasm of breast: Secondary | ICD-10-CM | POA: Diagnosis not present

## 2015-11-19 LAB — HM MAMMOGRAPHY: HM MAMMO: NEGATIVE

## 2015-11-24 ENCOUNTER — Other Ambulatory Visit: Payer: Self-pay | Admitting: Family Medicine

## 2015-11-25 ENCOUNTER — Encounter: Payer: Self-pay | Admitting: *Deleted

## 2015-11-26 ENCOUNTER — Telehealth: Payer: Self-pay | Admitting: Internal Medicine

## 2015-11-26 NOTE — Telephone Encounter (Signed)
LMTCB for [name]

## 2015-11-26 NOTE — Telephone Encounter (Signed)
[phone] ext [phone], Lonn Georgia cb

## 2015-11-26 NOTE — Telephone Encounter (Signed)
ATC Kayla and LMTCB x1

## 2015-11-29 NOTE — Telephone Encounter (Signed)
lmtcb X2 for [employer]

## 2015-12-01 NOTE — Telephone Encounter (Signed)
Spoke with [employer]. Advised her that we have not received this form. She is going to refax it to [place]. This will be attention to [facility].

## 2015-12-02 DIAGNOSIS — M25572 Pain in left ankle and joints of left foot: Secondary | ICD-10-CM | POA: Diagnosis not present

## 2015-12-02 DIAGNOSIS — M25571 Pain in right ankle and joints of right foot: Secondary | ICD-10-CM | POA: Diagnosis not present

## 2015-12-02 NOTE — Telephone Encounter (Signed)
Checked CY's folder up front and at [employer], no form has been received. Called [facility], spoke with Merry Proud, pharmacist.  Will be refaxing forms to our office.  Will await fax.

## 2015-12-03 NOTE — Telephone Encounter (Signed)
Checked [employer] and up front. This form has still not been received. lmtcb with [facility].

## 2015-12-06 NOTE — Telephone Encounter (Signed)
Called [facility], opt 2, opt 2 - spoke with Vicente Males Member ID [id number] Rx form is being re-faxed to our office ATTN: [name] Will send to [facility] to follow up

## 2015-12-07 NOTE — Telephone Encounter (Signed)
PA information given to me by [place] faxed to [facility] office. PA in process. Thanks.

## 2015-12-07 NOTE — Telephone Encounter (Signed)
No form seen in CY's folder up front or in CY's box.   Katie please advise if you've received this form yet.

## 2015-12-10 NOTE — Telephone Encounter (Signed)
Margie/Misty please advise if you have this form, and if this PA has been initiated.  Thanks!

## 2015-12-10 NOTE — Telephone Encounter (Signed)
I don't have this form.

## 2015-12-13 NOTE — Telephone Encounter (Signed)
No PA in triage. Misty is stating she does not have PA either. Katie, do you still have fax so we can start PA? thanks

## 2015-12-13 NOTE — Telephone Encounter (Signed)
I had started PA for this patient and not below. PA has been sent to Clinical Review team for further benefit investigation. Marland Kitchen    Ref # [id number]. Thanks.

## 2015-12-15 NOTE — Telephone Encounter (Signed)
Received fax that Follansbee '1000mg'$  injection was approved through 11/19/2016 under Medicare Part D benefit. [id number] Pt ID: [id number]

## 2015-12-15 NOTE — Telephone Encounter (Signed)
Called [facility]. Left a message with their PA department to check the status of this.

## 2015-12-17 DIAGNOSIS — M2012 Hallux valgus (acquired), left foot: Secondary | ICD-10-CM | POA: Diagnosis not present

## 2015-12-17 DIAGNOSIS — Z79899 Other long term (current) drug therapy: Secondary | ICD-10-CM | POA: Diagnosis not present

## 2015-12-17 DIAGNOSIS — M12272 Villonodular synovitis (pigmented), left ankle and foot: Secondary | ICD-10-CM | POA: Diagnosis not present

## 2016-01-04 DIAGNOSIS — M216X1 Other acquired deformities of right foot: Secondary | ICD-10-CM | POA: Diagnosis not present

## 2016-01-04 DIAGNOSIS — M2011 Hallux valgus (acquired), right foot: Secondary | ICD-10-CM | POA: Diagnosis not present

## 2016-01-04 DIAGNOSIS — M2010 Hallux valgus (acquired), unspecified foot: Secondary | ICD-10-CM | POA: Diagnosis not present

## 2016-01-07 DIAGNOSIS — M25571 Pain in right ankle and joints of right foot: Secondary | ICD-10-CM | POA: Diagnosis not present

## 2016-01-20 DIAGNOSIS — M25571 Pain in right ankle and joints of right foot: Secondary | ICD-10-CM | POA: Diagnosis not present

## 2016-02-03 DIAGNOSIS — M25571 Pain in right ankle and joints of right foot: Secondary | ICD-10-CM | POA: Diagnosis not present

## 2016-02-10 ENCOUNTER — Ambulatory Visit (INDEPENDENT_AMBULATORY_CARE_PROVIDER_SITE_OTHER): Payer: Medicare Other | Admitting: Family Medicine

## 2016-02-10 ENCOUNTER — Encounter: Payer: Self-pay | Admitting: Family Medicine

## 2016-02-10 VITALS — BP 135/78 | HR 76 | Temp 97.2°F | Ht 65.0 in | Wt 189.0 lb

## 2016-02-10 DIAGNOSIS — E8801 Alpha-1-antitrypsin deficiency: Secondary | ICD-10-CM | POA: Diagnosis not present

## 2016-02-10 DIAGNOSIS — M797 Fibromyalgia: Secondary | ICD-10-CM | POA: Diagnosis not present

## 2016-02-10 DIAGNOSIS — E559 Vitamin D deficiency, unspecified: Secondary | ICD-10-CM | POA: Diagnosis not present

## 2016-02-10 DIAGNOSIS — J449 Chronic obstructive pulmonary disease, unspecified: Secondary | ICD-10-CM | POA: Diagnosis not present

## 2016-02-10 DIAGNOSIS — Z1211 Encounter for screening for malignant neoplasm of colon: Secondary | ICD-10-CM | POA: Diagnosis not present

## 2016-02-10 DIAGNOSIS — E785 Hyperlipidemia, unspecified: Secondary | ICD-10-CM

## 2016-02-10 DIAGNOSIS — G4733 Obstructive sleep apnea (adult) (pediatric): Secondary | ICD-10-CM | POA: Diagnosis not present

## 2016-02-10 NOTE — Patient Instructions (Addendum)
Continue current medications. Continue good therapeutic lifestyle changes which include good diet and exercise. Fall precautions discussed with patient. If an FOBT was given today- please return it to our front desk. If you are over 58 years old - you may need Prevnar 62 or the adult Pneumonia vaccine.  **Flu shots are available--- please call and schedule a FLU-CLINIC appointment**  After your visit with [place] today you will receive a survey in the mail or online from [employer] regarding your care with [place]. Please take a moment to fill this out. Your feedback is very important to [place] as you can help [place] better understand your patient needs as well as improve your experience and satisfaction. WE CARE ABOUT YOU!!!     Continue to drink plenty of fluids and stay well hydrated Continue to use a cool mist humidifier in the house and keep the house as cool as possible Use nasal saline and nasal saline gel and continue with current breathing treatments Continue with follow-up with the pulmonologist Continue with your regular GYN appointments Return the FOBT card and we will call you with the results of the lab work as soon as that becomes available

## 2016-02-10 NOTE — Progress Notes (Signed)
Subjective:    Patient ID: [name], female    DOB: 1956/04/30, 60 y.o.   MRN: [id number]  HPI Pt here for follow up and management of chronic medical problems which includes hyperlipidemia. She is taking medications regularly.[name] continues to do well. She is followed regularly by Dr. Baird Rose her pulmonologist because of her alpha 1 antitrypsin deficiency. She also sees [name] and had her last exam in August 2016. She will be given an FOBT to return and we'll get her routine lab work today. She is going to check with her insurance regarding the Prevnar vaccine. The patient comes to the visit today with her husband. She is doing extremely well and has had no respiratory infections this winter. Her 6 month visit with her pulmonologist this coming up in April. She denies chest pain shortness of breath trouble swallowing heartburn indigestion nausea vomiting diarrhea or blood in the stool. She is passing her water without problems. The patient has had surgery on both feet with pins inserted because of callus formation and to straighten her toes to prevent this callus formation. The last surgery was in February of this year and she is doing well with this and it has improved her walking ability.      Patient Active Problem List   Diagnosis Date Noted  . Hyperlipidemia 10/06/2013  . Osteoporosis 06/25/2013  . Rhinitis, nonallergic 05/08/2012  . 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 DYSPNEA 06/24/2008  . Obstructive sleep apnea 06/02/2008  . Alpha-1-antitrypsin deficiency ([place]) 12/06/2007  . 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 Fibromyalgia 09/14/2007   Outpatient Encounter Prescriptions as of 02/10/2016  Medication Sig  . albuterol (PROVENTIL) (2.5 MG/3ML) 0.083% nebulizer solution Take 2.5 mg by nebulization every 6 (six) hours as needed.  Marland Kitchen alpha-1-proteinase inhibitor, human, (ZEMAIRA) 1000 MG SOLR Inject 60 mg/kg into the vein once a week.   Marland Kitchen amitriptyline  (ELAVIL) 75 MG tablet Take 75 mg by mouth at bedtime.    Marland Kitchen FLUoxetine (PROZAC) 20 MG capsule Take 40 mg by mouth daily.   . fluticasone (FLONASE) 50 MCG/ACT nasal spray USE 2 SPRAYS IN EACH NOSTRIL ONCE DAILY.  . furosemide (LASIX) 20 MG tablet 1 daily for occasional use - diuretic  . guaiFENesin (MUCINEX) 600 MG 12 hr tablet Take 1,200 mg by mouth 2 (two) times daily.    Marland Kitchen HYDROcodone-acetaminophen (NORCO/VICODIN) 5-325 MG per tablet Take 1 tablet by mouth 3 (three) times daily.  . Nebulizers (COMPRESSOR NEBULIZER) MISC 1 Device by Does not apply route as needed.  Marland Kitchen omeprazole (PRILOSEC) 40 MG capsule TAKE (1) CAPSULE DAILY  . PROAIR HFA 108 (90 BASE) MCG/ACT inhaler 2 PUFFS EVERY 4 HOURS AS NEEDED FOR WHEEZING  . rosuvastatin (CRESTOR) 40 MG tablet Take 0.5 tablets (20 mg total) by mouth daily.  Marland Kitchen SPIRIVA HANDIHALER 18 MCG inhalation capsule INHALE 1 CAPSULE DAILY  . SYMBICORT 80-4.5 MCG/ACT inhaler 2 PUFFS 2 TIMES A DAY  . ZETIA 10 MG tablet TAKE 1 TABLET ONCE A DAY   No facility-administered encounter medications on file as of 02/10/2016.      Review of Systems  HENT: Negative.   Eyes: Negative.   Respiratory: Negative.   Cardiovascular: Negative.   Gastrointestinal: Negative.   Endocrine: Negative.   Genitourinary: Negative.   Musculoskeletal: Negative.   Skin: Negative.   Allergic/Immunologic: Negative.   Neurological: Negative.   Hematological: Negative.   Psychiatric/Behavioral: Negative.        Objective:   Physical  Exam  Constitutional: She is oriented to person, place, and time. She appears well-developed and well-nourished. No distress.  HENT:  Head: Normocephalic and atraumatic.  Right Ear: External ear normal.  Left Ear: External ear normal.  Nose: Nose normal.  Mouth/Throat: Oropharynx is clear and moist.  Eyes: Conjunctivae and EOM are normal. Pupils are equal, round, and reactive to light. Right eye exhibits no discharge. Left eye exhibits no discharge. No  scleral icterus.  Neck: Normal range of motion. Neck supple. No JVD present. No thyromegaly present.  Cardiovascular: Normal rate, regular rhythm and normal heart sounds.   No murmur heard. Pulmonary/Chest: Effort normal and breath sounds normal. No respiratory distress. She has no wheezes. She has no rales. She exhibits no tenderness.  A few crackles in both lung bases but less than in the past.  Abdominal: Soft. Bowel sounds are normal. She exhibits no mass. There is no tenderness. There is no rebound and no guarding.  Nontender without masses or organ enlargement or bruits  Genitourinary:  Last pelvic was in August 2016 by [facility] and this was normal according to the patient.  Musculoskeletal: Normal range of motion. She exhibits no edema.  Lymphadenopathy:    She has no cervical adenopathy.  Neurological: She is alert and oriented to person, place, and time. She has normal reflexes. No cranial nerve deficit.  Skin: Skin is warm and dry. No rash noted.  Psychiatric: She has a normal mood and affect. Her behavior is normal. Judgment and thought content normal.  Nursing note and vitals reviewed.  BP 135/78 mmHg  Pulse 76  Temp(Src) 97.2 F (36.2 C) (Oral)  Ht '5\' 5"'$  (1.651 m)  Wt 189 lb (85.73 kg)  BMI 31.45 kg/m2        Assessment & Plan:  1. Hyperlipidemia -Continue current treatment pending results of lab work - BMP8+EGFR - CBC with Differential/Platelet - Hepatic function panel - NMR, lipoprofile  2. Vitamin D deficiency -Continue with vitamin D replacement pending results of lab work - CBC with Differential/Platelet - VITAMIN D 25 Hydroxy (Vit-D Deficiency, Fractures)  3. Chronic obstructive pulmonary disease, unspecified COPD, unspecified chronic bronchitis type -Breathing is stable she will continue with nebulizers and respiratory precaution - CBC with Differential/Platelet  4. Special screening for malignant neoplasms, colon - Fecal occult blood,  imunochemical; Future  5. Obstructive sleep apnea -She is currently not doing CPAP.  6. Fibromyalgia -She has no complaints with her fibromyalgia.  7. Alpha-1-antitrypsin deficiency ([place]) -Continue with treatment and follow-up with pulmonology  8. COPD mixed type ([place]) -Continue with treatment and follow-up with pulmonology  Patient Instructions  Continue current medications. Continue good therapeutic lifestyle changes which include good diet and exercise. Fall precautions discussed with patient. If an FOBT was given today- please return it to our front desk. If you are over 52 years old - you may need Prevnar 26 or the adult Pneumonia vaccine.  **Flu shots are available--- please call and schedule a FLU-CLINIC appointment**  After your visit with [place] today you will receive a survey in the mail or online from [employer] regarding your care with [place]. Please take a moment to fill this out. Your feedback is very important to [place] as you can help [place] better understand your patient needs as well as improve your experience and satisfaction. WE CARE ABOUT YOU!!!     Continue to drink plenty of fluids and stay well hydrated Continue to use a cool mist humidifier in the house and keep  the house as cool as possible Use nasal saline and nasal saline gel and continue with current breathing treatments Continue with follow-up with the pulmonologist Continue with your regular GYN appointments Return the FOBT card and we will call you with the results of the lab work as soon as that becomes available   Arrie Senate MD

## 2016-02-11 LAB — CBC WITH DIFFERENTIAL/PLATELET
BASOS ABS: 0 10*3/uL (ref 0.0–0.2)
Basos: 1 %
EOS (ABSOLUTE): 0.1 10*3/uL (ref 0.0–0.4)
Eos: 2 %
Hematocrit: 42.9 % (ref 34.0–46.6)
Hemoglobin: 14 g/dL (ref 11.1–15.9)
IMMATURE GRANS (ABS): 0 10*3/uL (ref 0.0–0.1)
IMMATURE GRANULOCYTES: 0 %
Lymphocytes Absolute: 2.9 10*3/uL (ref 0.7–3.1)
Lymphs: 40 %
MCH: 29.4 pg (ref 26.6–33.0)
MCHC: 32.6 g/dL (ref 31.5–35.7)
MCV: 90 fL (ref 79–97)
MONOCYTES: 8 %
Monocytes Absolute: 0.6 10*3/uL (ref 0.1–0.9)
Neutrophils Absolute: 3.7 10*3/uL (ref 1.4–7.0)
Neutrophils: 49 %
PLATELETS: 292 10*3/uL (ref 150–379)
RBC: 4.76 x10E6/uL (ref 3.77–5.28)
RDW: 14.1 % (ref 12.3–15.4)
WBC: 7.4 10*3/uL (ref 3.4–10.8)

## 2016-02-11 LAB — HEPATIC FUNCTION PANEL
ALT: 16 IU/L (ref 0–32)
AST: 24 IU/L (ref 0–40)
Albumin: 4 g/dL (ref 3.5–5.5)
Alkaline Phosphatase: 122 IU/L — ABNORMAL HIGH (ref 39–117)
BILIRUBIN TOTAL: 0.2 mg/dL (ref 0.0–1.2)
BILIRUBIN, DIRECT: 0.06 mg/dL (ref 0.00–0.40)
TOTAL PROTEIN: 7.2 g/dL (ref 6.0–8.5)

## 2016-02-11 LAB — BMP8+EGFR
BUN / CREAT RATIO: 14 (ref 9–23)
BUN: 10 mg/dL (ref 6–24)
CALCIUM: 9.3 mg/dL (ref 8.7–10.2)
CO2: 23 mmol/L (ref 18–29)
Chloride: 101 mmol/L (ref 96–106)
Creatinine, Ser: 0.7 mg/dL (ref 0.57–1.00)
GFR, EST AFRICAN AMERICAN: 111 mL/min/{1.73_m2} (ref >59)
GFR, EST NON AFRICAN AMERICAN: 96 mL/min/{1.73_m2} (ref >59)
Glucose: 106 mg/dL — ABNORMAL HIGH (ref 65–99)
POTASSIUM: 4 mmol/L (ref 3.5–5.2)
Sodium: 140 mmol/L (ref 134–144)

## 2016-02-11 LAB — NMR, LIPOPROFILE
CHOLESTEROL: 171 mg/dL (ref 100–199)
HDL Cholesterol by NMR: 50 mg/dL (ref >39)
HDL Particle Number: 34.8 umol/L (ref >=30.5)
LDL Particle Number: 1379 nmol/L — ABNORMAL HIGH (ref <1000)
LDL SIZE: 21.3 nm (ref >20.5)
LDL-C: 93 mg/dL (ref 0–99)
LP-IR Score: 54 — ABNORMAL HIGH (ref <=45)
SMALL LDL PARTICLE NUMBER: 542 nmol/L — AB (ref <=527)
TRIGLYCERIDES BY NMR: 140 mg/dL (ref 0–149)

## 2016-02-11 LAB — VITAMIN D 25 HYDROXY (VIT D DEFICIENCY, FRACTURES): Vit D, 25-Hydroxy: 45.2 ng/mL (ref 30.0–100.0)

## 2016-02-21 ENCOUNTER — Other Ambulatory Visit: Payer: Self-pay | Admitting: Family Medicine

## 2016-03-02 ENCOUNTER — Other Ambulatory Visit: Payer: Medicare Other

## 2016-03-02 DIAGNOSIS — Z1211 Encounter for screening for malignant neoplasm of colon: Secondary | ICD-10-CM

## 2016-03-02 DIAGNOSIS — M25571 Pain in right ankle and joints of right foot: Secondary | ICD-10-CM | POA: Diagnosis not present

## 2016-03-04 LAB — FECAL OCCULT BLOOD, IMMUNOCHEMICAL: Fecal Occult Bld: NEGATIVE

## 2016-03-22 ENCOUNTER — Other Ambulatory Visit: Payer: Self-pay | Admitting: Family Medicine

## 2016-03-22 DIAGNOSIS — M150 Primary generalized (osteo)arthritis: Secondary | ICD-10-CM | POA: Diagnosis not present

## 2016-03-22 DIAGNOSIS — M797 Fibromyalgia: Secondary | ICD-10-CM | POA: Diagnosis not present

## 2016-03-22 DIAGNOSIS — M810 Age-related osteoporosis without current pathological fracture: Secondary | ICD-10-CM | POA: Diagnosis not present

## 2016-03-22 DIAGNOSIS — M25512 Pain in left shoulder: Secondary | ICD-10-CM | POA: Diagnosis not present

## 2016-03-22 DIAGNOSIS — M5136 Other intervertebral disc degeneration, lumbar region: Secondary | ICD-10-CM | POA: Diagnosis not present

## 2016-03-28 ENCOUNTER — Encounter: Payer: Self-pay | Admitting: Internal Medicine

## 2016-03-28 ENCOUNTER — Ambulatory Visit (INDEPENDENT_AMBULATORY_CARE_PROVIDER_SITE_OTHER): Payer: Medicare Other | Admitting: Internal Medicine

## 2016-03-28 ENCOUNTER — Ambulatory Visit (INDEPENDENT_AMBULATORY_CARE_PROVIDER_SITE_OTHER)
Admission: RE | Admit: 2016-03-28 | Discharge: 2016-03-28 | Disposition: A | Discharge status: Home / Self Care | Payer: Medicare Other | Source: Ambulatory Visit | Attending: Internal Medicine | Admitting: Internal Medicine

## 2016-03-28 VITALS — BP 118/76 | HR 77 | Ht 65.0 in | Wt 192.4 lb

## 2016-03-28 DIAGNOSIS — J449 Chronic obstructive pulmonary disease, unspecified: Secondary | ICD-10-CM

## 2016-03-28 DIAGNOSIS — E8801 Alpha-1-antitrypsin deficiency: Secondary | ICD-10-CM | POA: Diagnosis not present

## 2016-03-28 DIAGNOSIS — Z720 Tobacco use: Secondary | ICD-10-CM

## 2016-03-28 DIAGNOSIS — J8410 Pulmonary fibrosis, unspecified: Secondary | ICD-10-CM | POA: Diagnosis not present

## 2016-03-28 NOTE — Patient Instructions (Signed)
Order- CXR   Dx COPD, alpha-1- antitrypsin deficiency  Please call as needed

## 2016-03-28 NOTE — Progress Notes (Signed)
11/05/12-  35 yoF former smoker on replacement for a1AT deficiency, with severe COPD, complicated by OSA and fibromyalgia. FOLLOWS FOR: doing good overall; no more than usual SOB, wheezing ,cough, or congestion    Daughter here Had large local reaction with heat and rash after flu shot last year( Fluzone) so we discussed different brands. She continues on Zemaira IV/ [facility]. replacement therapy for alpha-1 antitrypsin deficiency CXR 03/19/12- recommended follow-up attention to ? Early fibrotic changes   05/06/13-  55 yoF former smoker on replacement for a1AT deficiency, with severe COPD, complicated by OSA and fibromyalgia. Zemaira alpha 1 replacement. FOLLOWS FOR: review PFT results with patient, no more than usual of SOB. She continues her alpha 1 replacement intravenously each week with no problem. Some productive cough with clear sputum. Flonase worked well in the spring for rhinitis. Still noticing dyspnea on exertion especially if carrying a weight. Symbicort helps a lot. PFT- 05/06/2013-reduced FVC may reflect effort but the overall pattern is moderate obstructive airways disease with response to bronchodilator, air trapping, normal diffusion. This doesn't fit entirely with emphysema.  CXR 11/28/12 IMPRESSION:  No acute cardiopulmonary disease. Findings consistent with COPD.  No change from the prior study.  Original Report Authenticated By: Lajean Manes, M.D.  11/04/13- 50 yoF former smoker on replacement for a1AT deficiency, with severe COPD, complicated by OSA and fibromyalgia. Zemaira alpha 1 replacement.  Husband here Follows for- 6 month rov.  Pt c/o SOB with exertion, no other complaints.  Zemaira/ [facility] Baseline the congested cough but doing well. Discussed her vaccine status.  05/05/14- 57 yoF former smoker on replacement for a1AT deficiency, with severe COPD, complicated by OSA and fibromyalgia. Zemaira alpha 1 replacement.  Husband here FOLLOWS EVO:[id number] flare  ups-staying out of the heat.  She is avoiding the summer heat by staying in air conditioned areas. No acute events. Cough is usually productive. Says her nebulizer machine is worn out and asks if we can replace it  01/01/15 17 yoF former smoker on replacement for a1AT deficiency, with severe COPD, complicated by OSA and fibromyalgia. Zemaira alpha 1 replacement.  Husband here Dr. Laurance Flatten keeps her pneumonia vaccine up-to-date. She lost prescription from last visit for replacement nebulizer machine and asks up-to-date. Scant sputum is usually clear. No acute event CXR 05/05/14- IMPRESSION: COPD. There is no acute cardiopulmonary disease. Electronically Signed  By: David Martinique  On: 05/05/2014 11:36  09/29/15- 60 year old female former smoker on replacement for alpha-1 AT deficiency with severe COPD, complicated by OSA and fibromyalgia. Zemaira alpha 1 replacement. FOLLOWS FOR: Pt states she is having more SOB than in past otherwise things remain the same. PFT in 2014 showed moderate obstructive airways disease with normal diffusion Husband here Can walk 1 mile on treadmill but more trouble with stairs or carrying a load. Dr. Laurance Flatten manages her vaccinations. CXR 05/05/14 IMPRESSION: COPD. There is no acute cardiopulmonary disease. Electronically Signed  By: David Martinique  On: 05/05/2014 11:36  04/08/2016-60 year old female former smoker on replacement for alpha-1 18 deficiency with severe COPD, complicated by OSA, fibromyalgia.  Zemaira alpha 1 replacement FOLLOWS FOR: Pt states her breathing has been doing fine-denies any wheezing, cough, congestion, or SOB. Likes [facility] home care company that administers her alpha-1 proteinase inhibitor Zemaira. Meds okay, rare need for rescue inhaler. Some cough with scant clear mucus. Minimal OSA AHI 6.9/hour, not treated  ROS-see HPI Constitutional:   No-   weight loss, night sweats, fevers, chills, fatigue, lassitude. HEENT:   No-  headaches,  difficulty swallowing, tooth/dental problems, sore throat,       No-  sneezing, itching, ear ache, nasal congestion, post nasal drip,  CV:  No-   chest pain, orthopnea, PND, swelling in lower extremities, anasarca, dizziness, palpitations Resp: +shortness of breath with exertion or at rest.              No-productive cough,  No non-productive cough,  No- coughing up of blood.              No-change in color of mucus.  No- wheezing.   Skin: No-   rash or lesions. GI:  No-   heartburn, indigestion, abdominal pain, nausea, vomiting, GU:  MS:  No-   joint pain or swelling.   Neuro-     nothing unusual Psych:  No- change in mood or affect. No depression or anxiety.  No memory loss.   Objective:   Physical Exam General- Alert, Oriented, Affect-appropriate, Distress- none acute, obese Skin- rash-none, lesions- none, excoriation- none Lymphadenopathy- none Head- atraumatic            Eyes- Gross vision intact, PERRLA, conjunctivae clear secretions            Ears- Hearing, canals-normal            Nose- congested, no-Septal dev, mucus, polyps, erosion, perforation             Throat- Mallampati II , mucosa clear , drainage- none,                              tonsils- atrophic, +dentures Neck- flexible , trachea midline, no stridor , thyroid nl, carotid no bruit Chest - symmetrical excursion , unlabored           Heart/CV- RRR , no murmur , no gallop  , no rub, nl s1 s2                           - JVD- none , edema- none, stasis changes- none, varices- none           Lung- cough+ light ,  rhonchi-None, dullness-none, rub- none. Unlabored, + Raspy laugh           Chest wall-  Abd-  Br/ Gen/ Rectal- Not done, not indicated Extrem- cyanosis- none, clubbing, none, atrophy- none, strength- nl Neuro- grossly intact to observation

## 2016-03-29 NOTE — Assessment & Plan Note (Signed)
Appropriate to continue replacement therapy

## 2016-03-29 NOTE — Assessment & Plan Note (Signed)
She is not smoking

## 2016-03-29 NOTE — Assessment & Plan Note (Signed)
Stable at baseline without acute exacerbation. Medications appropriate. Plan-update chest x-ray

## 2016-05-18 ENCOUNTER — Other Ambulatory Visit: Payer: Self-pay | Admitting: Family Medicine

## 2016-06-01 ENCOUNTER — Other Ambulatory Visit: Payer: Self-pay | Admitting: Family Medicine

## 2016-06-01 MED ORDER — ALBUTEROL SULFATE (2.5 MG/3ML) 0.083% IN NEBU: 2.5000 mg | INHALATION_SOLUTION | Freq: Four times a day (QID) | RESPIRATORY_TRACT | Status: DC | PRN

## 2016-06-01 NOTE — Telephone Encounter (Signed)
done

## 2016-06-29 ENCOUNTER — Other Ambulatory Visit: Payer: Medicare Other

## 2016-06-29 DIAGNOSIS — J449 Chronic obstructive pulmonary disease, unspecified: Secondary | ICD-10-CM | POA: Diagnosis not present

## 2016-06-29 DIAGNOSIS — E785 Hyperlipidemia, unspecified: Secondary | ICD-10-CM

## 2016-06-29 DIAGNOSIS — M797 Fibromyalgia: Secondary | ICD-10-CM

## 2016-06-29 DIAGNOSIS — E8801 Alpha-1-antitrypsin deficiency: Secondary | ICD-10-CM

## 2016-06-29 DIAGNOSIS — E559 Vitamin D deficiency, unspecified: Secondary | ICD-10-CM

## 2016-06-30 ENCOUNTER — Encounter: Payer: Self-pay | Admitting: Family Medicine

## 2016-06-30 ENCOUNTER — Ambulatory Visit (INDEPENDENT_AMBULATORY_CARE_PROVIDER_SITE_OTHER): Payer: Medicare Other | Admitting: Family Medicine

## 2016-06-30 VITALS — BP 140/84 | HR 80 | Temp 97.3°F | Ht 65.0 in | Wt 188.0 lb

## 2016-06-30 DIAGNOSIS — E8801 Alpha-1-antitrypsin deficiency: Secondary | ICD-10-CM | POA: Diagnosis not present

## 2016-06-30 DIAGNOSIS — M797 Fibromyalgia: Secondary | ICD-10-CM | POA: Diagnosis not present

## 2016-06-30 DIAGNOSIS — J449 Chronic obstructive pulmonary disease, unspecified: Secondary | ICD-10-CM | POA: Diagnosis not present

## 2016-06-30 DIAGNOSIS — E559 Vitamin D deficiency, unspecified: Secondary | ICD-10-CM

## 2016-06-30 DIAGNOSIS — E785 Hyperlipidemia, unspecified: Secondary | ICD-10-CM

## 2016-06-30 LAB — BMP8+EGFR
BUN/Creatinine Ratio: 13 (ref 9–23)
BUN: 10 mg/dL (ref 6–24)
CALCIUM: 9.3 mg/dL (ref 8.7–10.2)
CHLORIDE: 100 mmol/L (ref 96–106)
CO2: 27 mmol/L (ref 18–29)
CREATININE: 0.77 mg/dL (ref 0.57–1.00)
GFR calc Af Amer: 98 mL/min/{1.73_m2} (ref >59)
GFR, EST NON AFRICAN AMERICAN: 85 mL/min/{1.73_m2} (ref >59)
Glucose: 103 mg/dL — ABNORMAL HIGH (ref 65–99)
Potassium: 4.7 mmol/L (ref 3.5–5.2)
Sodium: 142 mmol/L (ref 134–144)

## 2016-06-30 LAB — CBC WITH DIFFERENTIAL/PLATELET
BASOS ABS: 0 10*3/uL (ref 0.0–0.2)
Basos: 1 %
EOS (ABSOLUTE): 0.2 10*3/uL (ref 0.0–0.4)
Eos: 2 %
HEMOGLOBIN: 13.9 g/dL (ref 11.1–15.9)
Hematocrit: 43.3 % (ref 34.0–46.6)
Immature Grans (Abs): 0 10*3/uL (ref 0.0–0.1)
Immature Granulocytes: 0 %
LYMPHS: 45 %
Lymphocytes Absolute: 2.8 10*3/uL (ref 0.7–3.1)
MCH: 29.1 pg (ref 26.6–33.0)
MCHC: 32.1 g/dL (ref 31.5–35.7)
MCV: 91 fL (ref 79–97)
MONOCYTES: 9 %
Monocytes Absolute: 0.6 10*3/uL (ref 0.1–0.9)
NEUTROS ABS: 2.7 10*3/uL (ref 1.4–7.0)
Neutrophils: 43 %
PLATELETS: 292 10*3/uL (ref 150–379)
RBC: 4.77 x10E6/uL (ref 3.77–5.28)
RDW: 15 % (ref 12.3–15.4)
WBC: 6.3 10*3/uL (ref 3.4–10.8)

## 2016-06-30 LAB — NMR, LIPOPROFILE
CHOLESTEROL: 183 mg/dL (ref 100–199)
HDL CHOLESTEROL BY NMR: 51 mg/dL (ref >39)
HDL PARTICLE NUMBER: 35.8 umol/L (ref >=30.5)
LDL PARTICLE NUMBER: 1374 nmol/L — AB (ref <1000)
LDL Size: 21.5 nm (ref >20.5)
LDL-C: 101 mg/dL — AB (ref 0–99)
LP-IR Score: 54 — ABNORMAL HIGH (ref <=45)
Small LDL Particle Number: 424 nmol/L (ref <=527)
TRIGLYCERIDES BY NMR: 156 mg/dL — AB (ref 0–149)

## 2016-06-30 LAB — HEPATIC FUNCTION PANEL
ALBUMIN: 4.3 g/dL (ref 3.5–5.5)
ALK PHOS: 130 IU/L — AB (ref 39–117)
ALT: 22 IU/L (ref 0–32)
AST: 34 IU/L (ref 0–40)
Bilirubin Total: 0.2 mg/dL (ref 0.0–1.2)
Bilirubin, Direct: 0.08 mg/dL (ref 0.00–0.40)
TOTAL PROTEIN: 7.5 g/dL (ref 6.0–8.5)

## 2016-06-30 LAB — VITAMIN D 25 HYDROXY (VIT D DEFICIENCY, FRACTURES): Vit D, 25-Hydroxy: 46.1 ng/mL (ref 30.0–100.0)

## 2016-06-30 NOTE — Patient Instructions (Addendum)
Continue current medications. Continue good therapeutic lifestyle changes which include good diet and exercise. Fall precautions discussed with patient. If an FOBT was given today- please return it to our front desk. If you are over 58 years old - you may need Prevnar 10 or the adult Pneumonia vaccine.  After your visit with [place] today you will receive a survey in the mail or online from [employer] regarding your care with [place]. Please take a moment to fill this out. Your feedback is very important to [place] as you can help [place] better understand your patient needs as well as improve your experience and satisfaction. WE CARE ABOUT YOU!!!   Continue to use nebulizers and inhalers as doing Avoid irritating environments Follow-up with pulmonology Follow diet as closely as possible and lose weight if possible

## 2016-06-30 NOTE — Progress Notes (Signed)
Subjective:    Patient ID: [name], female    DOB: 1956/01/20, 60 y.o.   MRN: [id number]  HPI Pt here for follow up and management of chronic medical problems which includes hyperlipidemia and COPD. She is taking medication regularly.This patient continues to do well overall. She has alpha 1 antitrypsin deficiency. She is followed by the pulmonologist every 6 months and recently saw him in May of this year. She has no specific complaints. She does not need any refills. She has had her lab work and we will review this with her during the visit today. Cholesterol numbers with advanced lipid testing had a total LDL particle number that remains elevated summer to what it was previously and she is taking maximum doses of statins and Zetia. The LDL particle number is 1374. Triglycerides are slightly elevated at 156 and the good cholesterol was within normal limits. Renal function and electrolytes were all good the blood sugar was slightly increased at 103. The hemoglobin was good at 13.9 with a normal white blood cell count and the platelet count that was within normal limits. 1 liver function tests was slightly elevated all other wounds were normal. Alkaline phosphatase is been elevated in the past. Vitamin D level was good at 46.1. Patient denies any chest pain and no more shortness of breath than usual. With her condition she has shortness of breath with lifting and carrying articles. She feels some pressure with this. But this is no more than usual and her pulmonologist is aware of it. She denies any problems with nausea vomiting diarrhea blood in the stool black tarry bowel movements are heartburn. She is passing her water without problems. She drinks plenty of water and fluids on a daily basis. She is up-to-date on her health maintenance parameters.     Patient Active Problem List   Diagnosis Date Noted  . Hyperlipidemia 10/06/2013  . Osteoporosis 06/25/2013  . Rhinitis, nonallergic  05/08/2012  . 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 DYSPNEA 06/24/2008  . Obstructive sleep apnea 06/02/2008  . Alpha-1-antitrypsin deficiency ([place]) 12/06/2007  . 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 Fibromyalgia 09/14/2007   Outpatient Encounter Prescriptions as of 06/30/2016  Medication Sig  . albuterol (PROVENTIL) (2.5 MG/3ML) 0.083% nebulizer solution Take 3 mLs (2.5 mg total) by nebulization every 6 (six) hours as needed.  Marland Kitchen alpha-1-proteinase inhibitor, human, (ZEMAIRA) 1000 MG SOLR Inject 60 mg/kg into the vein once a week.   Marland Kitchen amitriptyline (ELAVIL) 75 MG tablet Take 75 mg by mouth at bedtime.    Marland Kitchen FLUoxetine (PROZAC) 20 MG capsule Take 40 mg by mouth daily.   . fluticasone (FLONASE) 50 MCG/ACT nasal spray USE 2 SPRAYS IN EACH NOSTRIL ONCE DAILY.  . furosemide (LASIX) 20 MG tablet 1 daily for occasional use - diuretic  . guaiFENesin (MUCINEX) 600 MG 12 hr tablet Take 1,200 mg by mouth 2 (two) times daily.    Marland Kitchen HYDROcodone-acetaminophen (NORCO/VICODIN) 5-325 MG per tablet Take 1 tablet by mouth 3 (three) times daily.  . Nebulizers (COMPRESSOR NEBULIZER) MISC 1 Device by Does not apply route as needed.  Marland Kitchen omeprazole (PRILOSEC) 40 MG capsule TAKE (1) CAPSULE DAILY  . PROAIR HFA 108 (90 Base) MCG/ACT inhaler 2 PUFFS EVERY 4 HOURS AS NEEDED FOR WHEEZING  . rosuvastatin (CRESTOR) 40 MG tablet Take 0.5 tablets (20 mg total) by mouth daily.  Marland Kitchen SPIRIVA HANDIHALER 18 MCG inhalation capsule INHALE 1 CAPSULE DAILY  . SYMBICORT 80-4.5 MCG/ACT inhaler 2 PUFFS 2  TIMES A DAY  . ZETIA 10 MG tablet TAKE 1 TABLET ONCE A DAY   No facility-administered encounter medications on file as of 06/30/2016.       Review of Systems  Constitutional: Negative.   HENT: Negative.   Eyes: Negative.   Respiratory: Negative.   Cardiovascular: Negative.   Gastrointestinal: Negative.   Endocrine: Negative.   Genitourinary: Negative.   Musculoskeletal: Negative.   Skin: Negative.   Allergic/Immunologic:  Negative.   Neurological: Negative.   Hematological: Negative.   Psychiatric/Behavioral: Negative.        Objective:   Physical Exam  Constitutional: She is oriented to person, place, and time. She appears well-developed and well-nourished. No distress.  HENT:  Head: Normocephalic and atraumatic.  Right Ear: External ear normal.  Left Ear: External ear normal.  Mouth/Throat: Oropharynx is clear and moist.  Slight nasal congestion bilaterally  Eyes: Conjunctivae and EOM are normal. Pupils are equal, round, and reactive to light. Right eye exhibits no discharge. Left eye exhibits no discharge. No scleral icterus.  Neck: Normal range of motion. Neck supple. No thyromegaly present.  No thyromegaly anterior cervical adenopathy or bruits  Cardiovascular: Normal rate, regular rhythm and intact distal pulses.  Exam reveals no friction rub.   No murmur heard. Pulmonary/Chest: Effort normal. She has no wheezes. She has no rales. She exhibits no tenderness.  Clear anteriorly and posteriorly except slight crackles at both lung bases but does not appear to be any worse than in the past. Dr. Annamaria Boots did a chest x-ray on her in May.  Abdominal: Soft. Bowel sounds are normal. She exhibits no mass. There is no tenderness. There is no rebound and no guarding.  Musculoskeletal: Normal range of motion. She exhibits no edema.  Lymphadenopathy:    She has no cervical adenopathy.  Neurological: She is alert and oriented to person, place, and time. She has normal reflexes. No cranial nerve deficit.  The knee jerk was slightly diminished on the left greater than the right.  Skin: Skin is warm and dry. No rash noted.  Psychiatric: She has a normal mood and affect. Her behavior is normal. Judgment and thought content normal.  Nursing note and vitals reviewed.  BP 140/84 (BP Location: Right Arm)   Pulse 80   Temp 97.3 F (36.3 C) (Oral)   Ht '5\' 5"'$  (1.651 m)   Wt 188 lb (85.3 kg)   BMI 31.28 kg/m          Assessment & Plan:  1. Hyperlipidemia -Continue with Crestor and Zetia and as aggressive therapeutic lifestyle changes as possible  2. Vitamin D deficiency -Continue with vitamin D replacement  3. Chronic obstructive pulmonary disease, unspecified COPD, unspecified chronic bronchitis type -Continue regular follow-up with pulmonology and regular breathing treatments and respiratory protection  4. Fibromyalgia -She is doing well with this at this time. She will continue with the amitriptyline and pain pill as needed.  5. Alpha-1-antitrypsin deficiency ([place]) -Continue regular follow-up with Dr. Baird Lyons, her pulmonologist.  Patient Instructions  Continue current medications. Continue good therapeutic lifestyle changes which include good diet and exercise. Fall precautions discussed with patient. If an FOBT was given today- please return it to our front desk. If you are over 66 years old - you may need Prevnar 43 or the adult Pneumonia vaccine.  After your visit with [place] today you will receive a survey in the mail or online from [employer] regarding your care with [place]. Please take a moment to fill  this out. Your feedback is very important to [place] as you can help [place] better understand your patient needs as well as improve your experience and satisfaction. WE CARE ABOUT YOU!!!   Continue to use nebulizers and inhalers as doing Avoid irritating environments Follow-up with pulmonology Follow diet as closely as possible and lose weight if possible   Arrie Senate MD

## 2016-07-18 ENCOUNTER — Other Ambulatory Visit: Payer: Self-pay | Admitting: *Deleted

## 2016-07-18 MED ORDER — FLUOXETINE HCL 20 MG PO CAPS: 40.0000 mg | ORAL_CAPSULE | Freq: Every day | ORAL | 2 refills | Status: DC

## 2016-07-19 ENCOUNTER — Encounter: Payer: Self-pay | Admitting: Family Medicine

## 2016-07-19 ENCOUNTER — Ambulatory Visit (INDEPENDENT_AMBULATORY_CARE_PROVIDER_SITE_OTHER): Payer: Medicare Other | Admitting: Family Medicine

## 2016-07-19 VITALS — BP 123/75 | HR 79 | Temp 98.0°F | Ht 65.0 in | Wt 185.0 lb

## 2016-07-19 DIAGNOSIS — R8290 Unspecified abnormal findings in urine: Secondary | ICD-10-CM | POA: Diagnosis not present

## 2016-07-19 DIAGNOSIS — H8303 Labyrinthitis, bilateral: Secondary | ICD-10-CM | POA: Diagnosis not present

## 2016-07-19 DIAGNOSIS — E8801 Alpha-1-antitrypsin deficiency: Secondary | ICD-10-CM | POA: Diagnosis not present

## 2016-07-19 DIAGNOSIS — R42 Dizziness and giddiness: Secondary | ICD-10-CM

## 2016-07-19 LAB — URINALYSIS, COMPLETE
BILIRUBIN UA: NEGATIVE
Glucose, UA: NEGATIVE
Ketones, UA: NEGATIVE
Nitrite, UA: POSITIVE — AB
PH UA: 7 (ref 5.0–7.5)
PROTEIN UA: NEGATIVE
Specific Gravity, UA: 1.01 (ref 1.005–1.030)
Urobilinogen, Ur: 0.2 mg/dL (ref 0.2–1.0)

## 2016-07-19 LAB — MICROSCOPIC EXAMINATION: WBC, UA: >30 /hpf — AB (ref 0–?)

## 2016-07-19 MED ORDER — FLUOXETINE HCL 20 MG PO CAPS: 40.0000 mg | ORAL_CAPSULE | Freq: Every day | ORAL | 3 refills | Status: DC

## 2016-07-19 MED ORDER — MECLIZINE HCL 12.5 MG PO TABS: 12.5000 mg | ORAL_TABLET | Freq: Three times a day (TID) | ORAL | 1 refills | Status: DC | PRN

## 2016-07-19 MED ORDER — SULFAMETHOXAZOLE-TRIMETHOPRIM 800-160 MG PO TABS: 1.0000 | ORAL_TABLET | Freq: Two times a day (BID) | ORAL | 0 refills | Status: DC

## 2016-07-19 NOTE — Progress Notes (Signed)
Subjective:    Patient ID: [name], female    DOB: [birth year], 58 y.o.   MRN: [id number]  HPI Patient here today for dizziness that started about 1 week ago. She is mainly effected with sudden movements and bending over.This dizziness has been going on for several days. She denies any fever gastrointestinal symptoms like diarrhea or vomiting or any voiding symptoms. She does feel queasy when she's feeling dizzy. With laying down on the table she felt that way and especially with laying down and turning her head. [name] not been coughing anymore than usual and no more shortness of breath than usual. She does have a history of alpha 1 antitrypsin deficiency.      Patient Active Problem List   Diagnosis Date Noted  . Hyperlipidemia 10/06/2013  . Osteoporosis 06/25/2013  . Rhinitis, nonallergic 05/08/2012  . 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 DYSPNEA 06/24/2008  . Obstructive sleep apnea 06/02/2008  . Alpha-1-antitrypsin deficiency ([place]) 12/06/2007  . 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 Fibromyalgia 09/14/2007   Outpatient Encounter Prescriptions as of 07/19/2016  Medication Sig  . albuterol (PROVENTIL) (2.5 MG/3ML) 0.083% nebulizer solution Take 3 mLs (2.5 mg total) by nebulization every 6 (six) hours as needed.  Marland Kitchen alpha-1-proteinase inhibitor, human, (ZEMAIRA) 1000 MG SOLR Inject 60 mg/kg into the vein once a week.   Marland Kitchen amitriptyline (ELAVIL) 75 MG tablet Take 75 mg by mouth at bedtime.    Marland Kitchen FLUoxetine (PROZAC) 20 MG capsule Take 2 capsules (40 mg total) by mouth daily.  . fluticasone (FLONASE) 50 MCG/ACT nasal spray USE 2 SPRAYS IN EACH NOSTRIL ONCE DAILY.  . furosemide (LASIX) 20 MG tablet 1 daily for occasional use - diuretic  . guaiFENesin (MUCINEX) 600 MG 12 hr tablet Take 1,200 mg by mouth 2 (two) times daily.    Marland Kitchen HYDROcodone-acetaminophen (NORCO/VICODIN) 5-325 MG per tablet Take 1 tablet by mouth 3 (three) times daily.  . Nebulizers (COMPRESSOR NEBULIZER) MISC 1  Device by Does not apply route as needed.  Marland Kitchen omeprazole (PRILOSEC) 40 MG capsule TAKE (1) CAPSULE DAILY  . PROAIR HFA 108 (90 Base) MCG/ACT inhaler 2 PUFFS EVERY 4 HOURS AS NEEDED FOR WHEEZING  . rosuvastatin (CRESTOR) 40 MG tablet Take 0.5 tablets (20 mg total) by mouth daily.  Marland Kitchen SPIRIVA HANDIHALER 18 MCG inhalation capsule INHALE 1 CAPSULE DAILY  . SYMBICORT 80-4.5 MCG/ACT inhaler 2 PUFFS 2 TIMES A DAY  . ZETIA 10 MG tablet TAKE 1 TABLET ONCE A DAY   No facility-administered encounter medications on file as of 07/19/2016.       Review of Systems  Constitutional: Negative.   HENT: Negative.   Eyes: Negative.   Respiratory: Negative.   Cardiovascular: Negative.   Gastrointestinal: Negative.   Endocrine: Negative.   Genitourinary: Negative.   Musculoskeletal: Negative.   Skin: Negative.   Allergic/Immunologic: Negative.   Neurological: Positive for dizziness (worse with bending over / leaning down).  Hematological: Negative.   Psychiatric/Behavioral: Negative.        Objective:   Physical Exam  Constitutional: She is oriented to person, place, and time. She appears well-developed and well-nourished. No distress.  HENT:  Head: Normocephalic and atraumatic.  Right Ear: External ear normal.  Left Ear: External ear normal.  Mouth/Throat: Oropharynx is clear and moist. No oropharyngeal exudate.  There is slight nasal congestion and turbinate swelling bilaterally  Eyes: Conjunctivae and EOM are normal. Pupils are equal, round, and reactive to light. Right eye exhibits no discharge.  Left eye exhibits no discharge. No scleral icterus.  Neck: Normal range of motion. Neck supple. No thyromegaly present.  The neck was without anterior cervical adenopathy  Cardiovascular: Normal rate, regular rhythm and normal heart sounds.   No murmur heard. The heart had a regular rate and rhythm at 72/m  Pulmonary/Chest: Effort normal. No respiratory distress. She has no wheezes. She has no rales.    The breast sounds were stable and consistent with past auscultation is because of her alpha 1 antitrypsin deficiency with basilar crackles posteriorly and bilaterally.  Abdominal: Soft. Bowel sounds are normal. She exhibits no mass. There is no tenderness. There is no rebound and no guarding.  No suprapubic tenderness  Musculoskeletal: Normal range of motion. She exhibits no edema.  Lymphadenopathy:    She has no cervical adenopathy.  Neurological: She is alert and oriented to person, place, and time. She has normal reflexes. No cranial nerve deficit.  There is definitely some dizziness with head movements up and down from side to side and especially with laying down and turning the head from one side to the other.  Skin: Skin is warm and dry. No rash noted.  Psychiatric: She has a normal mood and affect. Her behavior is normal. Judgment and thought content normal.  Nursing note and vitals reviewed.  BP 123/75 (BP Location: Left Arm)   Pulse 79   Temp 98 F (36.7 C) (Oral)   Ht '5\' 5"'$  (1.651 m)   Wt 185 lb (83.9 kg)   SpO2 93%   BMI 30.79 kg/m   EKG--the interpretation was an old anterior septal infarct plus left atrial enlargement but normal sinus rhythm. We will get a copy of her paper chart and find a copy of her EKG in the chart for comparison. I still believe her problem is not cardiac it is in her ear and she will take the medicine as directed.  The urinalysis is pending.     Assessment & Plan:  1. Dizziness -The patient's dizziness is most likely an inner ear type problem related to her allergies. It seems to be positionally related. She just started about recently on her Flonase. She will use this regularly from here out. She will use 2 sprays each nostril for couple weeks then reduce it to one spray each nostril indefinitely. - EKG 12-Lead - Urinalysis, Complete  2. Labyrinthitis, bilateral -Drink plenty of fluids and stay well hydrated and use meclizine 4 times daily with  food and at bedtime and continue to use Flonase as directed  3. Alpha-1-antitrypsin deficiency ([place]) -Drink plenty of fluids and stay well hydrated and continue to follow-up with pulmonology  Meds ordered this encounter  Medications  . FLUoxetine (PROZAC) 20 MG capsule    Sig: Take 2 capsules (40 mg total) by mouth daily.    Dispense:  180 capsule    Refill:  3  . meclizine (ANTIVERT) 12.5 MG tablet    Sig: Take 1 tablet (12.5 mg total) by mouth 3 (three) times daily as needed for dizziness.    Dispense:  30 tablet    Refill:  1   Patient Instructions  The patient will take her meclizine regularly for 1 week and then as needed. She will stay well hydrated and drink plenty of fluids She will avoid climbing therefore to avoid falling She will continue to use her Flonase regularly 2 sprays each nostril for 2 weeks and then cut it back to 1 spray each nostril indefinitely She will use  nasal saline during the day  Arrie Senate MD

## 2016-07-19 NOTE — Addendum Note (Signed)
Addended by: Zannie Cove on: 07/19/2016 05:18 PM   Modules accepted: Orders

## 2016-07-19 NOTE — Patient Instructions (Signed)
The patient will take her meclizine regularly for 1 week and then as needed. She will stay well hydrated and drink plenty of fluids She will avoid climbing therefore to avoid falling She will continue to use her Flonase regularly 2 sprays each nostril for 2 weeks and then cut it back to 1 spray each nostril indefinitely She will use nasal saline during the day

## 2016-08-01 ENCOUNTER — Other Ambulatory Visit: Payer: Medicare Other

## 2016-08-01 DIAGNOSIS — R8290 Unspecified abnormal findings in urine: Secondary | ICD-10-CM | POA: Diagnosis not present

## 2016-08-03 LAB — URINE CULTURE

## 2016-08-07 DIAGNOSIS — M25512 Pain in left shoulder: Secondary | ICD-10-CM | POA: Diagnosis not present

## 2016-08-07 DIAGNOSIS — M5136 Other intervertebral disc degeneration, lumbar region: Secondary | ICD-10-CM | POA: Diagnosis not present

## 2016-08-07 DIAGNOSIS — M810 Age-related osteoporosis without current pathological fracture: Secondary | ICD-10-CM | POA: Diagnosis not present

## 2016-08-07 DIAGNOSIS — M797 Fibromyalgia: Secondary | ICD-10-CM | POA: Diagnosis not present

## 2016-08-07 DIAGNOSIS — M150 Primary generalized (osteo)arthritis: Secondary | ICD-10-CM | POA: Diagnosis not present

## 2016-08-10 ENCOUNTER — Other Ambulatory Visit: Payer: Self-pay | Admitting: Family Medicine

## 2016-08-10 ENCOUNTER — Other Ambulatory Visit: Payer: Self-pay | Admitting: Pharmacist

## 2016-09-08 ENCOUNTER — Ambulatory Visit (INDEPENDENT_AMBULATORY_CARE_PROVIDER_SITE_OTHER): Payer: Medicare Other

## 2016-09-08 ENCOUNTER — Other Ambulatory Visit: Payer: Self-pay | Admitting: Family Medicine

## 2016-09-08 DIAGNOSIS — Z23 Encounter for immunization: Secondary | ICD-10-CM | POA: Diagnosis not present

## 2016-09-12 ENCOUNTER — Telehealth: Payer: Self-pay | Admitting: Family Medicine

## 2016-09-12 DIAGNOSIS — Z205 Contact with and (suspected) exposure to viral hepatitis: Secondary | ICD-10-CM

## 2016-09-12 HISTORY — DX: Contact with and (suspected) exposure to viral hepatitis: Z20.5

## 2016-09-12 NOTE — Telephone Encounter (Signed)
Patient aware.

## 2016-09-12 NOTE — Telephone Encounter (Signed)
Patients husband Kesi Perrow seen [facility] and tested positive for hep c and patient wants to know if she can just come in and get the blood work done or do you want her to make appointment? Please advise

## 2016-09-12 NOTE — Telephone Encounter (Signed)
I have placed orders, we discussed this on the phone when he was Dx'd. I am ok with testing.   Laroy Apple, MD [facility] 09/12/2016, 5:30 PM

## 2016-09-14 ENCOUNTER — Other Ambulatory Visit (INDEPENDENT_AMBULATORY_CARE_PROVIDER_SITE_OTHER): Payer: Medicare Other

## 2016-09-14 DIAGNOSIS — Z1159 Encounter for screening for other viral diseases: Secondary | ICD-10-CM

## 2016-09-15 LAB — HEPATITIS C ANTIBODY: Hep C Virus Ab: <0.1 s/co ratio (ref 0.0–0.9)

## 2016-09-18 DIAGNOSIS — Z779 Other contact with and (suspected) exposures hazardous to health: Secondary | ICD-10-CM | POA: Diagnosis not present

## 2016-09-29 ENCOUNTER — Encounter: Payer: Self-pay | Admitting: Internal Medicine

## 2016-09-29 ENCOUNTER — Ambulatory Visit (INDEPENDENT_AMBULATORY_CARE_PROVIDER_SITE_OTHER): Payer: Medicare Other | Admitting: Internal Medicine

## 2016-09-29 VITALS — BP 120/70 | HR 71 | Ht 65.0 in | Wt 190.2 lb

## 2016-09-29 DIAGNOSIS — J849 Interstitial pulmonary disease, unspecified: Secondary | ICD-10-CM

## 2016-09-29 DIAGNOSIS — J449 Chronic obstructive pulmonary disease, unspecified: Secondary | ICD-10-CM | POA: Diagnosis not present

## 2016-09-29 DIAGNOSIS — J8410 Pulmonary fibrosis, unspecified: Secondary | ICD-10-CM

## 2016-09-29 DIAGNOSIS — E8801 Alpha-1-antitrypsin deficiency: Secondary | ICD-10-CM | POA: Diagnosis not present

## 2016-09-29 DIAGNOSIS — R102 Pelvic and perineal pain: Secondary | ICD-10-CM | POA: Diagnosis not present

## 2016-09-29 HISTORY — DX: Pulmonary fibrosis, unspecified: J84.10

## 2016-09-29 NOTE — Assessment & Plan Note (Signed)
She has had a chronic bronchitis component. Hopefully the prominent interstitial markings seen on chest x-ray or related to her known COPD/alpha 1 antitrypsin deficiency and don't represent an additional diagnosis of Interstitial Lung Disease. We reviewed chest x-rays together. Plan-high resolution CT chest

## 2016-09-29 NOTE — Progress Notes (Signed)
HPI  27 yoF former smoker on replacement for a1AT deficiency, with severe COPD, complicated by OSA and fibromyalgia.    09/29/15- 60 year old female former smoker on replacement for alpha-1 AT deficiency with severe COPD, complicated by OSA and fibromyalgia. Zemaira alpha 1 replacement. FOLLOWS FOR: Pt states she is having more SOB than in past otherwise things remain the same. PFT in 2014 showed moderate obstructive airways disease with normal diffusion Husband here Can walk 1 mile on treadmill but more trouble with stairs or carrying a load. Dr. Laurance Flatten manages her vaccinations. CXR 05/05/14 IMPRESSION: COPD. There is no acute cardiopulmonary disease. Electronically Signed  By: David Martinique  On: 05/05/2014 11:36  04/08/2016-60 year old female former smoker on replacement for alpha-1 18 deficiency with severe COPD, complicated by mild OSA, fibromyalgia.  Zemaira alpha 1 replacement FOLLOWS FOR: Pt states her breathing has been doing fine-denies any wheezing, cough, congestion, or SOB. Likes [facility] company that administers her alpha-1 proteinase inhibitor Zemaira. Meds okay, rare need for rescue inhaler. Some cough with scant clear mucus. Minimal OSA AHI 6.9/hour, not treated  09/29/2016-60 year old female former smoker on replacement for off a-1- antitrypsin deficiency with severe COPD, ?ILD, complicated by mild OSA, fibromyalgia,  Zemaira alpha 1 replacement Minimal OSA AHI 6.9/hour, not treated FOLLOWS FOR: Pt denies any SOB, wheezing, cough, or congestion that is more that usual Denies any acute or specific change. Reports feeling somewhat more easily short of breath with exertion such as hills and stairs over the past year with no sudden event. Occasional cough and some clear sputum. CXR 03/28/2016- IMPRESSION: COPD. Increased pulmonary fibrotic changes. There is no alveolar pneumonia nor CHF.  ROS-see HPI Constitutional:   No-   weight loss, night sweats, fevers,  chills, fatigue, lassitude. HEENT:   No-  headaches, difficulty swallowing, tooth/dental problems, sore throat,       No-  sneezing, itching, ear ache, nasal congestion, post nasal drip,  CV:  No-   chest pain, orthopnea, PND, swelling in lower extremities, anasarca, dizziness, palpitations Resp: +shortness of breath with exertion or at rest.              No-productive cough,  No non-productive cough,  No- coughing up of blood.              No-change in color of mucus.  No- wheezing.   Skin: No-   rash or lesions. GI:  No-   heartburn, indigestion, abdominal pain, nausea, vomiting, GU:  MS:  No-   joint pain or swelling.   Neuro-     nothing unusual Psych:  No- change in mood or affect. No depression or anxiety.  No memory loss.   Objective:   Physical Exam General- Alert, Oriented, Affect-appropriate, Distress- none acute, +obese Skin- rash-none, lesions- none, excoriation- none Lymphadenopathy- none Head- atraumatic            Eyes- Gross vision intact, PERRLA, conjunctivae clear secretions            Ears- Hearing, canals-normal            Nose- congested, no-Septal dev, mucus, polyps, erosion, perforation             Throat- Mallampati II , mucosa clear , drainage- none,                              tonsils- atrophic, +dentures Neck- flexible , trachea midline, no stridor , thyroid nl, carotid no  bruit Chest - symmetrical excursion , unlabored           Heart/CV- RRR , no murmur , no gallop  , no rub, nl s1 s2                           - JVD- none , edema- none, stasis changes- none, varices- none           Lung- cough+ None ,  rhonchi-None, dullness-none, rub- none. Unlabored, + crackles-minimal in bases           Chest wall-  Abd-  Br/ Gen/ Rectal- Not done, not indicated Extrem- cyanosis- none, clubbing, none, atrophy- none, strength- nl Neuro- grossly intact to observation

## 2016-09-29 NOTE — Assessment & Plan Note (Signed)
Relatively quiet now and probably get current baseline without a significant active bronchitis component. Hopefully she will be able to overweight significant respiratory infections this winter. Plan-continue current meds.

## 2016-09-29 NOTE — Patient Instructions (Addendum)
Order- schedule CT chest High Resolution       Dx alpha 1 antitrypsin deficiency, Interstitial lung Disease   Ok to continue present meds  Please call if we can help

## 2016-09-29 NOTE — Assessment & Plan Note (Signed)
She continues proteinase replacement without treatment problems reported.

## 2016-10-05 ENCOUNTER — Ambulatory Visit (INDEPENDENT_AMBULATORY_CARE_PROVIDER_SITE_OTHER)
Admission: RE | Admit: 2016-10-05 | Discharge: 2016-10-05 | Disposition: A | Discharge status: Home / Self Care | Payer: Medicare Other | Source: Ambulatory Visit | Attending: Internal Medicine | Admitting: Internal Medicine

## 2016-10-05 DIAGNOSIS — J849 Interstitial pulmonary disease, unspecified: Secondary | ICD-10-CM

## 2016-10-05 DIAGNOSIS — R0602 Shortness of breath: Secondary | ICD-10-CM | POA: Diagnosis not present

## 2016-10-05 DIAGNOSIS — E8801 Alpha-1-antitrypsin deficiency: Secondary | ICD-10-CM

## 2016-11-03 ENCOUNTER — Encounter: Payer: Self-pay | Admitting: Family Medicine

## 2016-11-03 ENCOUNTER — Ambulatory Visit (INDEPENDENT_AMBULATORY_CARE_PROVIDER_SITE_OTHER): Payer: Medicare Other | Admitting: Family Medicine

## 2016-11-03 VITALS — BP 131/84 | HR 84 | Temp 97.2°F | Ht 65.0 in | Wt 191.0 lb

## 2016-11-03 DIAGNOSIS — C539 Malignant neoplasm of cervix uteri, unspecified: Secondary | ICD-10-CM | POA: Diagnosis not present

## 2016-11-03 DIAGNOSIS — E8801 Alpha-1-antitrypsin deficiency: Secondary | ICD-10-CM

## 2016-11-03 DIAGNOSIS — E559 Vitamin D deficiency, unspecified: Secondary | ICD-10-CM

## 2016-11-03 DIAGNOSIS — J449 Chronic obstructive pulmonary disease, unspecified: Secondary | ICD-10-CM

## 2016-11-03 DIAGNOSIS — M797 Fibromyalgia: Secondary | ICD-10-CM | POA: Diagnosis not present

## 2016-11-03 DIAGNOSIS — J8410 Pulmonary fibrosis, unspecified: Secondary | ICD-10-CM | POA: Diagnosis not present

## 2016-11-03 DIAGNOSIS — E7800 Pure hypercholesterolemia, unspecified: Secondary | ICD-10-CM | POA: Diagnosis not present

## 2016-11-03 NOTE — Patient Instructions (Addendum)
Continue current medications. Continue good therapeutic lifestyle changes which include good diet and exercise. Fall precautions discussed with patient. If an FOBT was given today- please return it to our front desk. If you are over 58 years old - you may need Prevnar 79 or the adult Pneumonia vaccine.  **Flu shots are available--- please call and schedule a FLU-CLINIC appointment**  After your visit with [place] today you will receive a survey in the mail or online from [employer] regarding your care with [place]. Please take a moment to fill this out. Your feedback is very important to [place] as you can help [place] better understand your patient needs as well as improve your experience and satisfaction. WE CARE ABOUT YOU!!!   The patient should continue to follow-up with pulmonology as she is doing because of her alpha 1 antitrypsin deficiency This winter she should drink plenty of fluids and keep the house as cool as possible and continue to use her breathing treatments as doing. She should make sure that she gets periodic pelvic exams.

## 2016-11-03 NOTE — Progress Notes (Signed)
Subjective:    Patient ID: [name], female    DOB: 19-May-1956, 60 y.o.   MRN: [id number]  HPI Pt here for follow up and management of chronic medical problems which includes hyperlipidemia. She is taking medications regularly.She is doing well today and has no specific complaints. She will get lab work done today. The patient is doing well as mentioned. She comes to the visit today with her husband. She is using her inhalers regularly. She does use overhead fans and she was asked to stop doing this. She is also reminded keep the house as cool as possible and drink plenty of fluids and keep using her Mucinex along with her inhalers. She denies any chest pain or palpitations or chest pressure. She denies any more shortness of breath than usual. She sees the pulmonologist every 6 months. She is swallowing her food without problems with no nausea vomiting diarrhea blood in the stool or black tarry bowel movements. She is passing her water without problems. Did today and alert.      Patient Active Problem List   Diagnosis Date Noted  . Pulmonary fibrosis, unspecified ([place]) 09/29/2016  . Exposure to hepatitis C 09/12/2016  . Hyperlipidemia 10/06/2013  . Osteoporosis 06/25/2013  . Rhinitis, nonallergic 05/08/2012  . 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 DYSPNEA 06/24/2008  . Obstructive sleep apnea 06/02/2008  . Alpha-1-antitrypsin deficiency ([place]) 12/06/2007  . 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 Fibromyalgia 09/14/2007   Outpatient Encounter Prescriptions as of 11/03/2016  Medication Sig  . albuterol (PROVENTIL) (2.5 MG/3ML) 0.083% nebulizer solution Take 3 mLs (2.5 mg total) by nebulization every 6 (six) hours as needed.  Marland Kitchen alpha-1-proteinase inhibitor, human, (ZEMAIRA) 1000 MG SOLR Inject 60 mg/kg into the vein once a week.   Marland Kitchen amitriptyline (ELAVIL) 75 MG tablet Take 75 mg by mouth at bedtime.    Marland Kitchen ezetimibe (ZETIA) 10 MG tablet TAKE 1 TABLET ONCE A DAY  . FLUoxetine (PROZAC)  20 MG capsule Take 2 capsules (40 mg total) by mouth daily.  . fluticasone (FLONASE) 50 MCG/ACT nasal spray USE 2 SPRAYS IN EACH NOSTRIL ONCE DAILY.  . furosemide (LASIX) 20 MG tablet 1 daily for occasional use - diuretic  . guaiFENesin (MUCINEX) 600 MG 12 hr tablet Take 1,200 mg by mouth 2 (two) times daily.    Marland Kitchen HYDROcodone-acetaminophen (NORCO/VICODIN) 5-325 MG per tablet Take 1 tablet by mouth 3 (three) times daily.  . meclizine (ANTIVERT) 12.5 MG tablet Take 1 tablet (12.5 mg total) by mouth 3 (three) times daily as needed for dizziness.  Marland Kitchen omeprazole (PRILOSEC) 40 MG capsule TAKE (1) CAPSULE DAILY  . PROAIR HFA 108 (90 Base) MCG/ACT inhaler 2 PUFFS EVERY 4 HOURS AS NEEDED FOR WHEEZING  . rosuvastatin (CRESTOR) 40 MG tablet TAKE 1/2 TABLET ONCE DAILY  . SPIRIVA HANDIHALER 18 MCG inhalation capsule INHALE 1 CAPSULE DAILY  . SYMBICORT 80-4.5 MCG/ACT inhaler 2 PUFFS 2 TIMES A DAY  . Nebulizers (COMPRESSOR NEBULIZER) MISC 1 Device by Does not apply route as needed.   No facility-administered encounter medications on file as of 11/03/2016.      Review of Systems  Constitutional: Negative.   HENT: Negative.   Eyes: Negative.   Respiratory: Negative.   Cardiovascular: Negative.   Gastrointestinal: Negative.   Endocrine: Negative.   Genitourinary: Negative.   Musculoskeletal: Negative.   Skin: Negative.   Allergic/Immunologic: Negative.   Neurological: Negative.   Hematological: Negative.   Psychiatric/Behavioral: Negative.  Objective:   Physical Exam  Constitutional: She is oriented to person, place, and time. She appears well-developed and well-nourished. No distress.  HENT:  Head: Normocephalic and atraumatic.  Right Ear: External ear normal.  Left Ear: External ear normal.  Nose: Nose normal.  Mouth/Throat: Oropharynx is clear and moist.  Eyes: Conjunctivae and EOM are normal. Pupils are equal, round, and reactive to light. Right eye exhibits no discharge. Left eye  exhibits no discharge. No scleral icterus.  Neck: Normal range of motion. Neck supple. No thyromegaly present.  Cardiovascular: Normal rate, regular rhythm, normal heart sounds and intact distal pulses.   No murmur heard. The heart is regular at 84/m  Pulmonary/Chest: Effort normal. No respiratory distress. She has no wheezes. She has no rales.  Diminished breath sounds as usual with basilar crackles as usual  Abdominal: Soft. Bowel sounds are normal. She exhibits no mass. There is no tenderness. There is no rebound and no guarding.  No abdominal tenderness or organ enlargement or masses  Musculoskeletal: Normal range of motion. She exhibits no edema.  Lymphadenopathy:    She has no cervical adenopathy.  Neurological: She is alert and oriented to person, place, and time. She has normal reflexes. No cranial nerve deficit.  Skin: Skin is warm and dry. No rash noted.  Psychiatric: She has a normal mood and affect. Her behavior is normal. Judgment and thought content normal.  Nursing note and vitals reviewed.   BP 131/84 (BP Location: Left Arm)   Pulse 84   Temp 97.2 F (36.2 C) (Oral)   Ht 5' 5" (1.651 m)   Wt 191 lb (86.6 kg)   SpO2 93%   BMI 31.78 kg/m         Assessment & Plan:  1. Vitamin D deficiency -Continue current treatment pending results of lab work - CBC with Differential/Platelet - VITAMIN D 25 Hydroxy (Vit-D Deficiency, Fractures)  2. Pure hypercholesterolemia -Continue current treatment pending results of lab work - BMP8+EGFR - CBC with Differential/Platelet - Hepatic function panel - Lipid panel  3. Fibromyalgia -The patient is doing well with this at the current time and she will continue her current treatment - CBC with Differential/Platelet  4. COPD mixed type ([place]) -Continue follow-up with pulmonology - CBC with Differential/Platelet  5. Alpha-1-antitrypsin deficiency ([place]) -Continue follow-up with pulmonology and continue with all inhalers  6.  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 Malignant neoplasm of cervix, unspecified site [facility]) -Continue to follow-up with gynecology.  Patient Instructions  Continue current medications. Continue good therapeutic lifestyle changes which include good diet and exercise. Fall precautions discussed with patient. If an FOBT was given today- please return it to our front desk. If you are over 38 years old - you may need Prevnar 1 or the adult Pneumonia vaccine.  **Flu shots are available--- please call and schedule a FLU-CLINIC appointment**  After your visit with [place] today you will receive a survey in the mail or online from [employer] regarding your care with [place]. Please take a moment to fill this out. Your feedback is very important to [place] as you can help [place] better understand your patient needs as well as improve your experience and satisfaction. WE CARE ABOUT YOU!!!   The patient should continue to follow-up with pulmonology as she is doing because of her alpha 1 antitrypsin deficiency This winter she should drink plenty of fluids and keep the house as cool as possible and continue to use her breathing  treatments as doing. She should make sure that she gets periodic pelvic exams.  Arrie Senate MD

## 2016-11-04 LAB — BMP8+EGFR
BUN / CREAT RATIO: 18 (ref 9–23)
BUN: 13 mg/dL (ref 6–24)
CO2: 25 mmol/L (ref 18–29)
CREATININE: 0.73 mg/dL (ref 0.57–1.00)
Calcium: 9.2 mg/dL (ref 8.7–10.2)
Chloride: 99 mmol/L (ref 96–106)
GFR calc Af Amer: 105 mL/min/{1.73_m2} (ref >59)
GFR calc non Af Amer: 91 mL/min/{1.73_m2} (ref >59)
GLUCOSE: 108 mg/dL — AB (ref 65–99)
Potassium: 4.7 mmol/L (ref 3.5–5.2)
SODIUM: 140 mmol/L (ref 134–144)

## 2016-11-04 LAB — LIPID PANEL
CHOLESTEROL TOTAL: 194 mg/dL (ref 100–199)
Chol/HDL Ratio: 3.7 ratio units (ref 0.0–4.4)
HDL: 52 mg/dL (ref >39)
LDL Calculated: 117 mg/dL — ABNORMAL HIGH (ref 0–99)
TRIGLYCERIDES: 125 mg/dL (ref 0–149)
VLDL Cholesterol Cal: 25 mg/dL (ref 5–40)

## 2016-11-04 LAB — CBC WITH DIFFERENTIAL/PLATELET
Basophils Absolute: 0.1 10*3/uL (ref 0.0–0.2)
Basos: 1 %
EOS (ABSOLUTE): 0.2 10*3/uL (ref 0.0–0.4)
EOS: 2 %
HEMATOCRIT: 42.7 % (ref 34.0–46.6)
Hemoglobin: 14 g/dL (ref 11.1–15.9)
Immature Grans (Abs): 0 10*3/uL (ref 0.0–0.1)
Immature Granulocytes: 0 %
LYMPHS ABS: 3.3 10*3/uL — AB (ref 0.7–3.1)
Lymphs: 46 %
MCH: 29.3 pg (ref 26.6–33.0)
MCHC: 32.8 g/dL (ref 31.5–35.7)
MCV: 89 fL (ref 79–97)
MONOS ABS: 0.6 10*3/uL (ref 0.1–0.9)
Monocytes: 8 %
NEUTROS ABS: 3.1 10*3/uL (ref 1.4–7.0)
Neutrophils: 43 %
Platelets: 296 10*3/uL (ref 150–379)
RBC: 4.78 x10E6/uL (ref 3.77–5.28)
RDW: 14.5 % (ref 12.3–15.4)
WBC: 7.2 10*3/uL (ref 3.4–10.8)

## 2016-11-04 LAB — HEPATIC FUNCTION PANEL
ALBUMIN: 4.3 g/dL (ref 3.5–5.5)
ALT: 30 IU/L (ref 0–32)
AST: 43 IU/L — AB (ref 0–40)
Alkaline Phosphatase: 150 IU/L — ABNORMAL HIGH (ref 39–117)
Bilirubin Total: <0.2 mg/dL (ref 0.0–1.2)
Bilirubin, Direct: 0.06 mg/dL (ref 0.00–0.40)
Total Protein: 7.3 g/dL (ref 6.0–8.5)

## 2016-11-04 LAB — VITAMIN D 25 HYDROXY (VIT D DEFICIENCY, FRACTURES): Vit D, 25-Hydroxy: 48.3 ng/mL (ref 30.0–100.0)

## 2016-11-06 DIAGNOSIS — M25512 Pain in left shoulder: Secondary | ICD-10-CM | POA: Diagnosis not present

## 2016-11-06 DIAGNOSIS — M797 Fibromyalgia: Secondary | ICD-10-CM | POA: Diagnosis not present

## 2016-11-06 DIAGNOSIS — M150 Primary generalized (osteo)arthritis: Secondary | ICD-10-CM | POA: Diagnosis not present

## 2016-11-06 DIAGNOSIS — M810 Age-related osteoporosis without current pathological fracture: Secondary | ICD-10-CM | POA: Diagnosis not present

## 2016-11-06 DIAGNOSIS — M5136 Other intervertebral disc degeneration, lumbar region: Secondary | ICD-10-CM | POA: Diagnosis not present

## 2016-11-07 ENCOUNTER — Telehealth: Payer: Self-pay | Admitting: Internal Medicine

## 2016-11-07 NOTE — Telephone Encounter (Signed)
Called and spoke with Kayla from [facility] and she stated that the form was for [place] to initiate a new authorization for the pt. [name] is aware that [name] it out of the office today, but will follow up on this form tomorrow.  Katie please advise. thanks

## 2016-11-09 NOTE — Telephone Encounter (Signed)
[name] from [facility] is calling to follow up with [name]. Josie Dixon stated the Josem Kaufmann will expire on 11/19/2016. Please return [name]'a call at [phone] [id number]

## 2016-11-09 NOTE — Telephone Encounter (Signed)
Will send to Katie to give call back, was this form faxed back?

## 2016-11-15 NOTE — Telephone Encounter (Signed)
LMTCB for [name]-I have not seen form nor have I faxed anything back. [name] to call back and confirm if someone else may have faxed forms to her or if they are still needed-will have her re-fax to me if that is the case. Mark as [name]

## 2016-11-15 NOTE — Telephone Encounter (Signed)
Katie please advise if this message can be closed.  Thanks!

## 2016-11-15 NOTE — Telephone Encounter (Signed)
Kayla with [facility] called back.  Per [name], I asked her to fax the form, which she states she will be doing now with Katie's attention.  [name] is aware it is being sent over now.

## 2016-11-17 ENCOUNTER — Ambulatory Visit: Payer: Medicare Other | Admitting: Pharmacist

## 2016-11-21 NOTE — Telephone Encounter (Signed)
I have called for the PA for the zemaria  To [phone] PT ID# [id number]  This has been approved through 11/19/2017  I have called and lmom for kayla from [facility] to make her aware of approval.

## 2016-11-21 NOTE — Telephone Encounter (Signed)
Calling to check on status of PA they can be reached @  [phone] ext [phone] [facility].Hillery Hunter

## 2016-11-23 ENCOUNTER — Telehealth: Payer: Self-pay | Admitting: Internal Medicine

## 2016-11-23 NOTE — Telephone Encounter (Signed)
Offer doxycycline 100 mg, # 8, 2 today then one daily   Stay well hydrated and warm

## 2016-11-23 NOTE — Telephone Encounter (Signed)
lmtcb x1 for pt. 

## 2016-11-23 NOTE — Telephone Encounter (Signed)
Spoke with pt. States that she is developing bronchitis. Reports cough and dry sinuses. Cough is non productive. Denies chest tightness, wheezing, SOB or fever. Symptoms started 3 days ago. Has been taking all of her prescribed pulmonary meds with minimal relief. Would like to have something called in to her pharmacy. CY - please advise. Thanks.  Allergies  Allergen Reactions  . Penicillins Nausea And Vomiting  . Actonel [Risedronate Sodium]     Caused her to retain fluid and stomach problems  . Advair Diskus [Fluticasone-Salmeterol]   . Alendronate Sodium     Stomach upset / esophageal burning  . Aspirin Nausea Only    Stomach burns; abdominal pain  . Levofloxacin     Reports hx of GI distress and GI bleed  . Morphine Itching and Swelling    Throat swells  . Nsaids     Swelling, GI upset   . Omnicef [Cefdinir]   . Erythromycin Rash  . Iodine Rash    Topical and IV   Current Outpatient Prescriptions on File Prior to Visit  Medication Sig Dispense Refill  . albuterol (PROVENTIL) (2.5 MG/3ML) 0.083% nebulizer solution Take 3 mLs (2.5 mg total) by nebulization every 6 (six) hours as needed. 75 mL 1  . alpha-1-proteinase inhibitor, human, (ZEMAIRA) 1000 MG SOLR Inject 60 mg/kg into the vein once a week.     Marland Kitchen amitriptyline (ELAVIL) 75 MG tablet Take 75 mg by mouth at bedtime.      Marland Kitchen ezetimibe (ZETIA) 10 MG tablet TAKE 1 TABLET ONCE A DAY 30 tablet 3  . FLUoxetine (PROZAC) 20 MG capsule Take 2 capsules (40 mg total) by mouth daily. 180 capsule 3  . fluticasone (FLONASE) 50 MCG/ACT nasal spray USE 2 SPRAYS IN EACH NOSTRIL ONCE DAILY. 16 g 5  . furosemide (LASIX) 20 MG tablet 1 daily for occasional use - diuretic 30 tablet 11  . guaiFENesin (MUCINEX) 600 MG 12 hr tablet Take 1,200 mg by mouth 2 (two) times daily.      Marland Kitchen HYDROcodone-acetaminophen (NORCO/VICODIN) 5-325 MG per tablet Take 1 tablet by mouth 3 (three) times daily.    . meclizine (ANTIVERT) 12.5 MG tablet Take 1 tablet  (12.5 mg total) by mouth 3 (three) times daily as needed for dizziness. 30 tablet 1  . Nebulizers (COMPRESSOR NEBULIZER) MISC 1 Device by Does not apply route as needed. 1 each prn  . omeprazole (PRILOSEC) 40 MG capsule TAKE (1) CAPSULE DAILY 30 capsule 3  . PROAIR HFA 108 (90 Base) MCG/ACT inhaler 2 PUFFS EVERY 4 HOURS AS NEEDED FOR WHEEZING 8.5 g 2  . rosuvastatin (CRESTOR) 40 MG tablet TAKE 1/2 TABLET ONCE DAILY 45 tablet 0  . SPIRIVA HANDIHALER 18 MCG inhalation capsule INHALE 1 CAPSULE DAILY 30 capsule 4  . SYMBICORT 80-4.5 MCG/ACT inhaler 2 PUFFS 2 TIMES A DAY 10.2 g 3   No current facility-administered medications on file prior to visit.

## 2016-11-24 MED ORDER — DOXYCYCLINE HYCLATE 100 MG PO TABS: ORAL_TABLET | ORAL | 0 refills | Status: DC

## 2016-11-24 NOTE — Telephone Encounter (Signed)
Spoke with pt. And informed her of CY recc. Pt. Agreed to the medication. The rx was sent to her pharmacy of choice. Nothing further is needed at this time.

## 2016-11-24 NOTE — Telephone Encounter (Signed)
Patient returning phone call [phone].

## 2017-01-05 ENCOUNTER — Other Ambulatory Visit: Payer: Self-pay | Admitting: Family Medicine

## 2017-02-02 ENCOUNTER — Other Ambulatory Visit: Payer: Self-pay | Admitting: Family Medicine

## 2017-02-08 DIAGNOSIS — M150 Primary generalized (osteo)arthritis: Secondary | ICD-10-CM | POA: Diagnosis not present

## 2017-02-08 DIAGNOSIS — M5136 Other intervertebral disc degeneration, lumbar region: Secondary | ICD-10-CM | POA: Diagnosis not present

## 2017-02-08 DIAGNOSIS — M797 Fibromyalgia: Secondary | ICD-10-CM | POA: Diagnosis not present

## 2017-03-29 ENCOUNTER — Ambulatory Visit: Payer: Medicare Other | Admitting: Internal Medicine

## 2017-04-05 ENCOUNTER — Encounter: Payer: Self-pay | Admitting: Family Medicine

## 2017-04-05 ENCOUNTER — Ambulatory Visit (INDEPENDENT_AMBULATORY_CARE_PROVIDER_SITE_OTHER): Payer: Medicare Other | Admitting: Family Medicine

## 2017-04-05 VITALS — BP 137/79 | HR 81 | Temp 98.1°F | Ht 65.0 in | Wt 194.0 lb

## 2017-04-05 DIAGNOSIS — E8801 Alpha-1-antitrypsin deficiency: Secondary | ICD-10-CM

## 2017-04-05 DIAGNOSIS — Z23 Encounter for immunization: Secondary | ICD-10-CM

## 2017-04-05 DIAGNOSIS — E559 Vitamin D deficiency, unspecified: Secondary | ICD-10-CM | POA: Diagnosis not present

## 2017-04-05 DIAGNOSIS — E7800 Pure hypercholesterolemia, unspecified: Secondary | ICD-10-CM | POA: Diagnosis not present

## 2017-04-05 DIAGNOSIS — M797 Fibromyalgia: Secondary | ICD-10-CM | POA: Diagnosis not present

## 2017-04-05 DIAGNOSIS — I700 Atherosclerosis of aorta: Secondary | ICD-10-CM

## 2017-04-05 DIAGNOSIS — J449 Chronic obstructive pulmonary disease, unspecified: Secondary | ICD-10-CM

## 2017-04-05 NOTE — Addendum Note (Signed)
Addended by: Zannie Cove on: 04/05/2017 09:29 AM   Modules accepted: Orders

## 2017-04-05 NOTE — Addendum Note (Signed)
Addended by: Zannie Cove on: 04/05/2017 09:31 AM   Modules accepted: Orders

## 2017-04-05 NOTE — Progress Notes (Signed)
Subjective:    Patient ID: [name], female    DOB: Jun 14, 1956, 61 y.o.   MRN: [id number]  HPI Pt here for follow up and management of chronic medical problems which includes hyperlipidemia. She is taking medication regularly.The patient sees the pulmonologist regularly. She has alpha 1 antitrypsin deficiency. A recent CT scan showed changes of emphysema and chronic bronchitis consistent with her diagnosis. There was none of the pulmonary fibrosis and the pulmonologist was please with that. It did reveal some atherosclerosis in her arteries. She has no specific complaints today and will be given an FOBT to return and will get lab work today. The CT scan that was recently done by her: A neurologist from November 2017 was reviewed with her she was given a copy of this report for her records. The patient is doing well overall and her breathing is fairly stable. She says her worst time of the years the summer months when it's hot and muggy. She states in the house more that time. She is followed by the pulmonologist every 6 months. The recent CT scan report was reviewed with her she was given a copy of this. It did show that she has some coronary calcium buildup and we will send her to the cardiologist to further evaluate this even though she's not having any chest pain. Her breathing is stable as mentioned. She is having no trouble with swallowing heartburn indigestion nausea vomiting diarrhea or blood in the stool and she is up-to-date on her colonoscopies with the next when not view until 10 years after the last one. She is passing her water without problems. She comes to the visit today with her husband.     Patient Active Problem List   Diagnosis Date Noted  . Pulmonary fibrosis, unspecified ([place]) 09/29/2016  . Exposure to hepatitis C 09/12/2016  . Hyperlipidemia 10/06/2013  . Osteoporosis 06/25/2013  . Rhinitis, nonallergic 05/08/2012  . 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 DYSPNEA  06/24/2008  . Obstructive sleep apnea 06/02/2008  . Alpha-1-antitrypsin deficiency ([place]) 12/06/2007  . 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 Fibromyalgia 09/14/2007   Outpatient Encounter Prescriptions as of 04/05/2017  Medication Sig  . albuterol (PROVENTIL) (2.5 MG/3ML) 0.083% nebulizer solution Take 3 mLs (2.5 mg total) by nebulization every 6 (six) hours as needed.  Marland Kitchen alpha-1-proteinase inhibitor, human, (ZEMAIRA) 1000 MG SOLR Inject 60 mg/kg into the vein once a week.   Marland Kitchen amitriptyline (ELAVIL) 75 MG tablet Take 75 mg by mouth at bedtime.    Marland Kitchen ezetimibe (ZETIA) 10 MG tablet TAKE 1 TABLET ONCE A DAY  . FLUoxetine (PROZAC) 20 MG capsule Take 2 capsules (40 mg total) by mouth daily.  . fluticasone (FLONASE) 50 MCG/ACT nasal spray USE 2 SPRAYS IN EACH NOSTRIL ONCE DAILY.  . furosemide (LASIX) 20 MG tablet 1 daily for occasional use - diuretic  . guaiFENesin (MUCINEX) 600 MG 12 hr tablet Take 1,200 mg by mouth 2 (two) times daily.    Marland Kitchen HYDROcodone-acetaminophen (NORCO/VICODIN) 5-325 MG per tablet Take 1 tablet by mouth 3 (three) times daily.  . meclizine (ANTIVERT) 12.5 MG tablet Take 1 tablet (12.5 mg total) by mouth 3 (three) times daily as needed for dizziness.  . Nebulizers (COMPRESSOR NEBULIZER) MISC 1 Device by Does not apply route as needed.  Marland Kitchen omeprazole (PRILOSEC) 40 MG capsule TAKE (1) CAPSULE DAILY  . PROAIR HFA 108 (90 Base) MCG/ACT inhaler 2 PUFFS EVERY 4 HOURS AS NEEDED FOR WHEEZING  . rosuvastatin (  CRESTOR) 40 MG tablet TAKE 1/2 TABLET ONCE DAILY  . SPIRIVA HANDIHALER 18 MCG inhalation capsule INHALE 1 CAPSULE DAILY  . SYMBICORT 80-4.5 MCG/ACT inhaler 2 PUFFS 2 TIMES A DAY  . [DISCONTINUED] doxycycline (VIBRA-TABS) 100 MG tablet Take 2 tabs today, then one daily   No facility-administered encounter medications on file as of 04/05/2017.      Review of Systems  Constitutional: Negative.   HENT: Negative.   Eyes: Negative.   Respiratory: Negative.   Cardiovascular: Negative.    Gastrointestinal: Negative.   Endocrine: Negative.   Genitourinary: Negative.   Musculoskeletal: Negative.   Skin: Negative.   Allergic/Immunologic: Negative.   Neurological: Negative.   Hematological: Negative.   Psychiatric/Behavioral: Negative.        Objective:   Physical Exam  Constitutional: She is oriented to person, place, and time. She appears well-developed and well-nourished.  The patient is pleasant and alert and in good spirits and thankful for her stable condition with her alpha 1 antitrypsin deficiency  HENT:  Head: Normocephalic and atraumatic.  Right Ear: External ear normal.  Left Ear: External ear normal.  Mouth/Throat: Oropharynx is clear and moist. No oropharyngeal exudate.  Slight nasal congestion  Eyes: Conjunctivae and EOM are normal. Pupils are equal, round, and reactive to light. Right eye exhibits no discharge. Left eye exhibits no discharge. No scleral icterus.  Last eye exam was 2 months ago and this was good.  Neck: Normal range of motion. Neck supple. No thyromegaly present.  No bruits thyromegaly or anterior cervical adenopathy  Cardiovascular: Normal rate, regular rhythm, normal heart sounds and intact distal pulses.   No murmur heard. The heart had a regular rate and rhythm at 60/m  Pulmonary/Chest: Effort normal. No respiratory distress. She has no wheezes. She has rales.  Lungs were clear anteriorly and posteriorly. At both lung bases posteriorly there were crackles as usual.  Abdominal: Soft. Bowel sounds are normal. She exhibits no mass. There is no tenderness. There is no rebound and no guarding.  No abdominal tenderness masses or bruits or organ enlargement  Musculoskeletal: Normal range of motion. She exhibits no edema.  Lymphadenopathy:    She has no cervical adenopathy.  Neurological: She is alert and oriented to person, place, and time. She has normal reflexes. No cranial nerve deficit.  Skin: Skin is warm and dry. No rash noted.    Psychiatric: She has a normal mood and affect. Her behavior is normal. Judgment and thought content normal.  Nursing note and vitals reviewed.  BP 137/79 (BP Location: Right Arm)   Pulse 81   Temp 98.1 F (36.7 C) (Oral)   Ht 5' 5" (1.651 m)   Wt 194 lb (88 kg)   BMI 32.28 kg/m         Assessment & Plan:  1. Pure hypercholesterolemia -Continue with current treatment and with as aggressive therapeutic lifestyle changes as possible - CBC with Differential/Platelet - BMP8+EGFR - Hepatic function panel - NMR, lipoprofile  2. Vitamin D deficiency -Continue with current treatment pending results of lab work - CBC with Differential/Platelet - VITAMIN D 25 Hydroxy (Vit-D Deficiency, Fractures)  3. Fibromyalgia -Continue to exercise and walk as much as possible - CBC with Differential/Platelet  4. Alpha-1-antitrypsin deficiency ([place]) -Avoid exposure to people that are sick and crowds of people as much as possible -Continue with inhalers and drink plenty of fluids and stay well hydrated -Follow-up with pulmonology as planned - CBC with Differential/Platelet - BMP8+EGFR  5. COPD mixed  type ([place]) -Continue with current treatments and follow up with pulmonology as planned - CBC with Differential/Platelet - BMP8+EGFR  6. Aortic atherosclerosis (H[facility]) -Because of a recent CT scan showing coronary atherosclerosis and aortic atherosclerosis, she will be referred to the cardiologist for further evaluation and workup even though she is not having any chest pain pressure or tightness other than her breathing issues. She is no longer a smoker.  Patient Instructions                       Medicare Annual Wellness Visit  [facility] and the medical providers at [facility] strive to bring you the best medical care.  In doing so we not only want to address your current medical conditions and concerns but also to detect new conditions early and prevent illness,  disease and health-related problems.    Medicare offers a yearly Wellness Visit which allows our clinical staff to assess your need for preventative services including immunizations, lifestyle education, counseling to decrease risk of preventable diseases and screening for fall risk and other medical concerns.    This visit is provided free of charge (no copay) for all Medicare recipients. The clinical pharmacists at [facility] have begun to conduct these Wellness Visits which will also include a thorough review of all your medications.    As you primary medical provider recommend that you make an appointment for your Annual Wellness Visit if you have not done so already this year.  You may set up this appointment before you leave today or you may call back ([phone]) and schedule an appointment.  Please make sure when you call that you mention that you are scheduling your Annual Wellness Visit with the clinical pharmacist so that the appointment may be made for the proper length of time.     Continue current medications. Continue good therapeutic lifestyle changes which include good diet and exercise. Fall precautions discussed with patient. If an FOBT was given today- please return it to our front desk. If you are over 81 years old - you may need Prevnar 28 or the adult Pneumonia vaccine.  **Flu shots are available--- please call and schedule a FLU-CLINIC appointment**  After your visit with [place] today you will receive a survey in the mail or online from [employer] regarding your care with [place]. Please take a moment to fill this out. Your feedback is very important to [place] as you can help [place] better understand your patient needs as well as improve your experience and satisfaction. WE CARE ABOUT YOU!!!   Continue to follow-up with pulmonology Do not use any fans Keep the house as cool as possible Drink plenty of fluids and stay well hydrated Use Mucinex as needed Continue  with Flonase and use nasal saline during the day  Arrie Senate MD

## 2017-04-05 NOTE — Patient Instructions (Addendum)
Medicare Annual Wellness Visit  [facility] and the medical providers at [facility] strive to bring you the best medical care.  In doing so we not only want to address your current medical conditions and concerns but also to detect new conditions early and prevent illness, disease and health-related problems.    Medicare offers a yearly Wellness Visit which allows our clinical staff to assess your need for preventative services including immunizations, lifestyle education, counseling to decrease risk of preventable diseases and screening for fall risk and other medical concerns.    This visit is provided free of charge (no copay) for all Medicare recipients. The clinical pharmacists at [facility] have begun to conduct these Wellness Visits which will also include a thorough review of all your medications.    As you primary medical provider recommend that you make an appointment for your Annual Wellness Visit if you have not done so already this year.  You may set up this appointment before you leave today or you may call back ([phone]) and schedule an appointment.  Please make sure when you call that you mention that you are scheduling your Annual Wellness Visit with the clinical pharmacist so that the appointment may be made for the proper length of time.     Continue current medications. Continue good therapeutic lifestyle changes which include good diet and exercise. Fall precautions discussed with patient. If an FOBT was given today- please return it to our front desk. If you are over 56 years old - you may need Prevnar 24 or the adult Pneumonia vaccine.  **Flu shots are available--- please call and schedule a FLU-CLINIC appointment**  After your visit with [place] today you will receive a survey in the mail or online from [employer] regarding your care with [place]. Please take a moment to fill this out. Your feedback is very  important to [place] as you can help [place] better understand your patient needs as well as improve your experience and satisfaction. WE CARE ABOUT YOU!!!   Continue to follow-up with pulmonology Do not use any fans Keep the house as cool as possible Drink plenty of fluids and stay well hydrated Use Mucinex as needed Continue with Flonase and use nasal saline during the day

## 2017-04-06 LAB — CBC WITH DIFFERENTIAL/PLATELET
Basophils Absolute: 0.1 10*3/uL (ref 0.0–0.2)
Basos: 1 %
EOS (ABSOLUTE): 0.1 10*3/uL (ref 0.0–0.4)
EOS: 1 %
HEMATOCRIT: 43.2 % (ref 34.0–46.6)
HEMOGLOBIN: 13.7 g/dL (ref 11.1–15.9)
IMMATURE GRANS (ABS): 0 10*3/uL (ref 0.0–0.1)
IMMATURE GRANULOCYTES: 0 %
LYMPHS: 35 %
Lymphocytes Absolute: 2.6 10*3/uL (ref 0.7–3.1)
MCH: 27.9 pg (ref 26.6–33.0)
MCHC: 31.7 g/dL (ref 31.5–35.7)
MCV: 88 fL (ref 79–97)
MONOCYTES: 8 %
MONOS ABS: 0.6 10*3/uL (ref 0.1–0.9)
NEUTROS PCT: 55 %
Neutrophils Absolute: 4.2 10*3/uL (ref 1.4–7.0)
Platelets: 331 10*3/uL (ref 150–379)
RBC: 4.91 x10E6/uL (ref 3.77–5.28)
RDW: 14.9 % (ref 12.3–15.4)
WBC: 7.6 10*3/uL (ref 3.4–10.8)

## 2017-04-06 LAB — BMP8+EGFR
BUN/Creatinine Ratio: 15 (ref 9–23)
BUN: 11 mg/dL (ref 6–24)
CALCIUM: 9.1 mg/dL (ref 8.7–10.2)
CO2: 25 mmol/L (ref 18–29)
Chloride: 100 mmol/L (ref 96–106)
Creatinine, Ser: 0.74 mg/dL (ref 0.57–1.00)
GFR calc Af Amer: 103 mL/min/{1.73_m2} (ref >59)
GFR, EST NON AFRICAN AMERICAN: 90 mL/min/{1.73_m2} (ref >59)
Glucose: 108 mg/dL — ABNORMAL HIGH (ref 65–99)
POTASSIUM: 4.4 mmol/L (ref 3.5–5.2)
Sodium: 139 mmol/L (ref 134–144)

## 2017-04-06 LAB — NMR, LIPOPROFILE
Cholesterol: 163 mg/dL (ref 100–199)
HDL Cholesterol by NMR: 50 mg/dL (ref >39)
HDL Particle Number: 33.1 umol/L (ref >=30.5)
LDL Particle Number: 1283 nmol/L — ABNORMAL HIGH (ref <1000)
LDL Size: 21.1 nm (ref >20.5)
LDL-C: 86 mg/dL (ref 0–99)
LP-IR SCORE: 40 (ref <=45)
Small LDL Particle Number: 650 nmol/L — ABNORMAL HIGH (ref <=527)
Triglycerides by NMR: 135 mg/dL (ref 0–149)

## 2017-04-06 LAB — HEPATIC FUNCTION PANEL
ALBUMIN: 4.2 g/dL (ref 3.5–5.5)
ALT: 15 IU/L (ref 0–32)
AST: 27 IU/L (ref 0–40)
Alkaline Phosphatase: 140 IU/L — ABNORMAL HIGH (ref 39–117)
BILIRUBIN TOTAL: 0.3 mg/dL (ref 0.0–1.2)
Bilirubin, Direct: 0.08 mg/dL (ref 0.00–0.40)
Total Protein: 7.6 g/dL (ref 6.0–8.5)

## 2017-04-06 LAB — VITAMIN D 25 HYDROXY (VIT D DEFICIENCY, FRACTURES): Vit D, 25-Hydroxy: 53.7 ng/mL (ref 30.0–100.0)

## 2017-04-18 ENCOUNTER — Encounter: Payer: Medicare Other | Admitting: *Deleted

## 2017-04-18 DIAGNOSIS — Z1231 Encounter for screening mammogram for malignant neoplasm of breast: Secondary | ICD-10-CM | POA: Diagnosis not present

## 2017-05-01 ENCOUNTER — Other Ambulatory Visit: Payer: Self-pay | Admitting: Family Medicine

## 2017-05-04 ENCOUNTER — Telehealth: Payer: Self-pay | Admitting: Internal Medicine

## 2017-05-04 NOTE — Telephone Encounter (Signed)
ATC x 2 and line was busy, [facility]

## 2017-05-07 ENCOUNTER — Telehealth: Payer: Self-pay | Admitting: Family Medicine

## 2017-05-07 MED ORDER — FUROSEMIDE 20 MG PO TABS: ORAL_TABLET | ORAL | 11 refills | Status: DC

## 2017-05-07 NOTE — Telephone Encounter (Signed)
It would bee better that she get this from her PCP please.

## 2017-05-07 NOTE — Telephone Encounter (Signed)
Spoke with pt. She is needing a refill on Furosemide 20mg . This was last given to her by CY in 2012.  CY - please advise on refill. Thanks.

## 2017-05-07 NOTE — Telephone Encounter (Signed)
Done

## 2017-05-07 NOTE — Telephone Encounter (Signed)
Pt is aware of CY's below message and voiced her understanding. Nothing further needed.

## 2017-05-14 DIAGNOSIS — M150 Primary generalized (osteo)arthritis: Secondary | ICD-10-CM | POA: Diagnosis not present

## 2017-05-14 DIAGNOSIS — M797 Fibromyalgia: Secondary | ICD-10-CM | POA: Diagnosis not present

## 2017-05-14 DIAGNOSIS — M5136 Other intervertebral disc degeneration, lumbar region: Secondary | ICD-10-CM | POA: Diagnosis not present

## 2017-05-29 NOTE — Progress Notes (Signed)
Cardiology Office Note   Date:  05/31/2017   ID:  Aela, Bohan 09-Feb-1956, MRN [id number]  PCP:  [name], MD  Cardiologist:   Minus Breeding, MD  Referring:  [name], MD  Chief Complaint  Patient presents with  . Aortic Atherosclerosis     History of Present Illness: [name] is a 61 y.o. female who is referred by Dr. Laurance Flatten for evaluation of aortic atherosclerosis.  She has no prior cardiac history.  She has multiple medical problems as below.   However, she gets along fairly well.  She does all of her household chores.  She is limited more by back and leg pain from fibromyalgia than from dyspnea.  She does however, get some dyspnea.  The patient denies any symptoms such as chest discomfort, neck or arm discomfort. There has been no PND or orthopnea. There have been no reported palpitations, presyncope or syncope.  Past Medical History:  Diagnosis Date  . Alpha-1-antitrypsin deficiency ([place])   . Cervical cancer ([place]) 2004  . 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 This doesn't fit entirely with emphysema.      . Exposure to hepatitis C 09/12/2016  . Fibromyalgia   . GERD (gastroesophageal reflux disease)   . Hyperlipidemia   . IBS (irritable bowel syndrome)   . Obstructive sleep apnea 06/02/2008   NPSG 06/07/08, AHI 6.9/ hr, weight 220 lbs    . Osteoporosis   . Pulmonary fibrosis, unspecified ([place]) 09/29/2016   CXR 03/28/2016  . Rhinitis, nonallergic 05/08/2012  . 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 Vitamin D deficiency     Past Surgical History:  Procedure Laterality Date  . ABDOMINAL HYSTERECTOMY    . LUMBAR DISC SURGERY       Current Outpatient Prescriptions  Medication Sig Dispense Refill  . albuterol (PROVENTIL) (2.5 MG/3ML)  0.083% nebulizer solution Take 3 mLs (2.5 mg total) by nebulization every 6 (six) hours as needed. 75 mL 1  . alpha-1-proteinase inhibitor, human, (ZEMAIRA) 1000 MG SOLR Inject 60 mg/kg into the vein once a week.     Marland Kitchen amitriptyline (ELAVIL) 75 MG tablet Take 75 mg by mouth at bedtime.      Marland Kitchen ezetimibe (ZETIA) 10 MG tablet TAKE 1 TABLET ONCE A DAY 30 tablet 5  . FLUoxetine (PROZAC) 20 MG capsule Take 2 capsules (40 mg total) by mouth daily. 180 capsule 3  . fluticasone (FLONASE) 50 MCG/ACT nasal spray USE 2 SPRAYS IN EACH NOSTRIL ONCE DAILY. 16 g 5  . furosemide (LASIX) 20 MG tablet 1 daily for occasional use - diuretic 30 tablet 11  . guaiFENesin (MUCINEX) 600 MG 12 hr tablet Take 1,200 mg by mouth 2 (two) times daily.      Marland Kitchen HYDROcodone-acetaminophen (NORCO/VICODIN) 5-325 MG per tablet Take 1 tablet by mouth 3 (three) times daily.    . meclizine (ANTIVERT) 12.5 MG tablet Take 1 tablet (12.5 mg total) by mouth 3 (three) times daily as needed for dizziness. 30 tablet 1  . omeprazole (PRILOSEC) 40 MG capsule TAKE (1) CAPSULE DAILY 30 capsule 5  . PROAIR HFA 108 (90 Base) MCG/ACT inhaler 2 PUFFS EVERY 4 HOURS AS NEEDED FOR WHEEZING 8.5 g 2  . rosuvastatin (CRESTOR) 40 MG tablet TAKE 1/2 TABLET ONCE DAILY 45  tablet 0  . SPIRIVA HANDIHALER 18 MCG inhalation capsule INHALE 1 CAPSULE DAILY 30 capsule 5  . SYMBICORT 80-4.5 MCG/ACT inhaler 2 PUFFS 2 TIMES A DAY 10.2 g 5  . Nebulizers (COMPRESSOR NEBULIZER) MISC 1 Device by Does not apply route as needed. 1 each prn   No current facility-administered medications for this visit.     Allergies:   Penicillins; Actonel [risedronate sodium]; Advair diskus [fluticasone-salmeterol]; Alendronate sodium; Aspirin; Levofloxacin; Morphine; Nsaids; Omnicef [cefdinir]; Erythromycin; and Iodine    Social History:  The patient  reports that she quit smoking about 5 years ago. Her smoking use included Cigarettes. She started smoking about 44 years ago. She smoked 0.50  packs per day. She has never used smokeless tobacco. She reports that she does not drink alcohol or use drugs.   Family History:  The patient's family history includes Alpha-1 antitrypsin deficiency in her other and sister; CAD (age of onset: 47) in her brother; CAD (age of onset: 57) in her sister; Heart attack (age of onset: 47) in her father; Irregular heart beat in her mother; Leukemia in her brother; Lung cancer in her brother; Migraines in her father; Osteoporosis in her mother, sister, and sister; Pancreatic cancer in her mother.    ROS:  Please see the history of present illness.   Otherwise, review of systems are positive for occasional headaches, reflux, joint pains..   All other systems are reviewed and negative.    PHYSICAL EXAM: VS:  BP (!) 142/76   Pulse 82   Ht 5\' 5"  (1.651 m)   Wt 194 lb (88 kg)   BMI 32.28 kg/m  , BMI Body mass index is 32.28 kg/m. GENERAL:  Well appearing HEENT:  Pupils equal round and reactive, fundi not visualized, oral mucosa unremarkable NECK:  No jugular venous distention, waveform within normal limits, carotid upstroke brisk and symmetric, no bruits, no thyromegaly LYMPHATICS:  No cervical, inguinal adenopathy LUNGS:  Clear to auscultation bilaterally BACK:  No CVA tenderness CHEST:  Unremarkable HEART:  PMI not displaced or sustained,S1 and S2 within normal limits, no S3, no S4, no clicks, no rubs, no murmurs ABD:  Flat, positive bowel sounds normal in frequency in pitch, no bruits, no rebound, no guarding, no midline pulsatile mass, no hepatomegaly, no splenomegaly EXT:  2 plus pulses throughout, no edema, no cyanosis no clubbing SKIN:  No rashes no nodules NEURO:  Cranial nerves II through XII grossly intact, motor grossly intact throughout PSYCH:  Cognitively intact, oriented to person place and time    EKG:  EKG is ordered today. The ekg ordered today demonstrates sinus rhythm, rate 80, axis within normal limits, intervals within normal  limits, no acute ST-T wave changes.   Recent Labs: 04/05/2017: ALT 15; BUN 11; Creatinine, Ser 0.74; Hemoglobin 13.7; Platelets 331; Potassium 4.4; Sodium 139    Lipid Panel    Component Value Date/Time   CHOL 163 04/05/2017 0918   CHOL 194 11/03/2016 0948   CHOL 200 (H) 02/13/2013 1523   TRIG 135 04/05/2017 0918   TRIG 175 (H) 02/13/2013 1523   HDL 50 04/05/2017 0918   HDL 49 02/13/2013 1523   CHOLHDL 3.7 11/03/2016 0948   LDLCALC 117 (H) 11/03/2016 0948   LDLCALC 89 05/26/2014 1030   LDLCALC 116 (H) 02/13/2013 1523      Wt Readings from Last 3 Encounters:  05/30/17 194 lb (88 kg)  04/05/17 194 lb (88 kg)  11/03/16 191 lb (86.6 kg)  Other studies Reviewed: Additional studies/ records that were reviewed today include: None. Review of the above records demonstrates:  Please see elsewhere in the note.     ASSESSMENT AND PLAN:  AORTIC ATHEROSCLEROSIS:  The patient will continue with aggressive risk reduction. She has no symptoms related to this at this point.  DYSPNEA:  She is a very strong family history. She certainly has risk factors and evidence of atherosclerosis as above. This could certainly be an anginal equivalent. I will bring the patient back for a POET (Plain Old Exercise Test). This will allow me to screen for obstructive coronary disease, risk stratify and very importantly provide a prescription for exercise.  HTN:  The blood pressure is at target. No change in medications is indicated. We will continue with therapeutic lifestyle changes (TLC).  DYSLIPIDEMIA:  Her LDL is 86 with and HDL of 50 earlier this year.  This is the best it has been on good therapy.  I would suggest no change to this.    Current medicines are reviewed at length with the patient today.  The patient does not have concerns regarding medicines.  The following changes have been made:  no change  Labs/ tests ordered today include:   Orders Placed This Encounter  Procedures  .  Exercise Tolerance Test  . EKG 12-Lead     Disposition:   FU with me as needed.     Signed, Minus Breeding, MD  05/31/2017 1:27 AM    [facility]

## 2017-05-30 ENCOUNTER — Encounter: Payer: Self-pay | Admitting: Cardiology

## 2017-05-30 ENCOUNTER — Ambulatory Visit (INDEPENDENT_AMBULATORY_CARE_PROVIDER_SITE_OTHER): Payer: Medicare Other | Admitting: Cardiology

## 2017-05-30 VITALS — BP 142/76 | HR 82 | Ht 65.0 in | Wt 194.0 lb

## 2017-05-30 DIAGNOSIS — E785 Hyperlipidemia, unspecified: Secondary | ICD-10-CM

## 2017-05-30 DIAGNOSIS — I10 Essential (primary) hypertension: Secondary | ICD-10-CM

## 2017-05-30 DIAGNOSIS — I700 Atherosclerosis of aorta: Secondary | ICD-10-CM | POA: Diagnosis not present

## 2017-05-30 DIAGNOSIS — R0602 Shortness of breath: Secondary | ICD-10-CM | POA: Diagnosis not present

## 2017-05-30 NOTE — Patient Instructions (Signed)
Medication Instructions:  The current medical regimen is effective;  continue present plan and medications.  Testing/Procedures: Your physician has requested that you have an exercise tolerance test. For further information please visit [email or web address]. Please also follow instruction sheet, as given.  Follow-Up: Follow up will be based on the results of the above testing.  Thank you for choosing [facility]!!

## 2017-05-31 ENCOUNTER — Encounter: Payer: Self-pay | Admitting: Cardiology

## 2017-05-31 DIAGNOSIS — I700 Atherosclerosis of aorta: Secondary | ICD-10-CM | POA: Insufficient documentation

## 2017-05-31 DIAGNOSIS — E785 Hyperlipidemia, unspecified: Secondary | ICD-10-CM | POA: Insufficient documentation

## 2017-05-31 DIAGNOSIS — I10 Essential (primary) hypertension: Secondary | ICD-10-CM | POA: Insufficient documentation

## 2017-06-07 ENCOUNTER — Other Ambulatory Visit: Payer: Self-pay | Admitting: Family Medicine

## 2017-06-12 ENCOUNTER — Ambulatory Visit (INDEPENDENT_AMBULATORY_CARE_PROVIDER_SITE_OTHER): Payer: Medicare Other | Admitting: Internal Medicine

## 2017-06-12 ENCOUNTER — Encounter: Payer: Self-pay | Admitting: Internal Medicine

## 2017-06-12 ENCOUNTER — Ambulatory Visit (INDEPENDENT_AMBULATORY_CARE_PROVIDER_SITE_OTHER)
Admission: RE | Admit: 2017-06-12 | Discharge: 2017-06-12 | Disposition: A | Discharge status: Home / Self Care | Payer: Medicare Other | Source: Ambulatory Visit | Attending: Internal Medicine | Admitting: Internal Medicine

## 2017-06-12 VITALS — BP 114/78 | HR 80 | Ht 65.0 in | Wt 192.2 lb

## 2017-06-12 DIAGNOSIS — J449 Chronic obstructive pulmonary disease, unspecified: Secondary | ICD-10-CM

## 2017-06-12 DIAGNOSIS — J849 Interstitial pulmonary disease, unspecified: Secondary | ICD-10-CM | POA: Diagnosis not present

## 2017-06-12 DIAGNOSIS — J8410 Pulmonary fibrosis, unspecified: Secondary | ICD-10-CM

## 2017-06-12 DIAGNOSIS — R05 Cough: Secondary | ICD-10-CM | POA: Diagnosis not present

## 2017-06-12 DIAGNOSIS — E8801 Alpha-1-antitrypsin deficiency: Secondary | ICD-10-CM | POA: Diagnosis not present

## 2017-06-12 DIAGNOSIS — R0602 Shortness of breath: Secondary | ICD-10-CM | POA: Diagnosis not present

## 2017-06-12 NOTE — Assessment & Plan Note (Signed)
Follow-up CXR 06/12/17 does not show obvious progressive fibrosis. We may just be seeing scarring and airway thickening of COPD.

## 2017-06-12 NOTE — Progress Notes (Signed)
HPI  61 yoF former smoker on replacement for a1AT deficiency, with severe COPD, complicated by OSA and fibromyalgia. 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 Reports feeling somewhat more easily short of breath with exertion such as hills and stairs over the past year with no sudden event. Occasional cough and some clear sputum. CXR 03/28/2016- IMPRESSION: COPD. Increased pulmonary fibrotic changes. There is no alveolar pneumonia nor CHF.  06/12/17- 61 year old female former smoker on replacement for off a-1- antitrypsin deficiency with severe COPD, ?ILD, complicated by mild OSA/not treated, fibromyalgia,  Zemaira alpha 1 replacement FOLLOWS FOR: Pt states she has become more SOB with activity; Has had to start using nebulizer Rx at least once a day. Dr. Hochrein/cardiology has scheduled an exercise tolerance test in August. Using nebulizer machine/albuterol, Spiriva, Symbicort 80, pro air She and her husband say she's been somewhat more short of breath recently with exertion but blames it on" humidity". No routine cough or wheeze and no acute events. She can tell Symbicort helps if she misses a dose. Very comfortable with [facility] for [place].  ROS-see HPI   + = pos Constitutional:   No-   weight loss, night sweats, fevers, chills, fatigue, lassitude. HEENT:   No-  headaches, difficulty swallowing, tooth/dental problems, sore throat,       No-   sneezing, itching, ear ache, nasal congestion, post nasal drip,  CV:  No-   chest pain, orthopnea, PND, swelling in lower extremities, anasarca, dizziness, palpitations Resp: +shortness of breath with exertion or at rest.              No-productive cough,  No non-productive cough,  No- coughing up of blood.              No-change in color of mucus.  No- wheezing.   Skin: No-   rash or lesions. GI:  No-   heartburn, indigestion, abdominal pain, nausea, vomiting, GU:  MS:  No-   joint pain or swelling.   Neuro-     nothing unusual Psych:  No- change in mood or affect. No depression or anxiety.  No memory loss.   Objective:   Physical Exam    Stable baseline exam General- Alert, Oriented, Affect-appropriate, Distress- none acute, +obese Skin- rash-none, lesions- none, excoriation- none Lymphadenopathy- none Head- atraumatic            Eyes- Gross vision intact, PERRLA, conjunctivae clear secretions            Ears- Hearing, canals-normal            Nose- congested, no-Septal dev, mucus, polyps, erosion, perforation             Throat- Mallampati II , mucosa clear , drainage- none,                              tonsils- atrophic, +dentures Neck- flexible , trachea midline, no stridor , thyroid nl, carotid no bruit Chest - symmetrical excursion , unlabored           Heart/CV- RRR ,  no murmur , no gallop  , no rub, nl s1 s2                           - JVD- none , edema- none, stasis changes- none, varices- none           Lung- cough+ None ,  rhonchi-None, dullness-none, rub- none. Unlabored, + crackles-minimal in bases           Chest wall-  Abd-  Br/ Gen/ Rectal- Not done, not indicated Extrem- cyanosis- none, clubbing, none, atrophy- none, strength- nl Neuro- grossly intact to observation

## 2017-06-12 NOTE — Assessment & Plan Note (Signed)
Recent awareness of dyspnea on exertion could be related to humidity as she thinks that it is time for [place] to update CXR and PFT to evaluate for possible disease progression. Current meds seem appropriate.

## 2017-06-12 NOTE — Patient Instructions (Signed)
Order- schedule PFT    Dx COPD mixed type  Order- CXR    Dx COPD mixed type, question ILD  Ok to use your nebulizer as often as eery 6 hours if you really need to.  Ok to continue Zemaira infusions for the alpha 1 antitrypsin deficiency  Please call as needed

## 2017-06-12 NOTE — Assessment & Plan Note (Signed)
It is appropriate to continue weekly alpha-1 replacement

## 2017-06-21 ENCOUNTER — Ambulatory Visit (INDEPENDENT_AMBULATORY_CARE_PROVIDER_SITE_OTHER): Payer: Medicare Other

## 2017-06-21 DIAGNOSIS — I700 Atherosclerosis of aorta: Secondary | ICD-10-CM | POA: Diagnosis not present

## 2017-06-21 LAB — EXERCISE TOLERANCE TEST
CHL CUP STRESS STAGE 1 GRADE: 0 %
CHL CUP STRESS STAGE 1 HR: 78 {beats}/min
CHL CUP STRESS STAGE 2 GRADE: 0 %
CHL CUP STRESS STAGE 2 HR: 82 {beats}/min
CHL CUP STRESS STAGE 2 SPEED: 0 mph
CHL CUP STRESS STAGE 4 GRADE: 0 %
CHL CUP STRESS STAGE 4 SPEED: 1 mph
CHL CUP STRESS STAGE 5 GRADE: 10 %
CHL CUP STRESS STAGE 5 HR: 108 {beats}/min
CHL CUP STRESS STAGE 5 SPEED: 1.7 mph
CHL CUP STRESS STAGE 6 DBP: 77 mmHg
CHL CUP STRESS STAGE 6 GRADE: 12 %
CHL CUP STRESS STAGE 6 HR: 129 {beats}/min
CHL CUP STRESS STAGE 7 HR: 134 {beats}/min
CHL CUP STRESS STAGE 8 DBP: 81 mmHg
CHL CUP STRESS STAGE 8 SBP: 212 mmHg
CHL CUP STRESS STAGE 9 HR: 88 {beats}/min
CSEPED: 6 min
CSEPEW: 7.6 METS
CSEPHR: 83 %
CSEPPHR: 134 {beats}/min
Exercise duration (sec): 50 s
MPHR: 161 {beats}/min
Percent of predicted max HR: 83 %
RPE: 17
Rest HR: 77 {beats}/min
Stage 1 DBP: 85 mmHg
Stage 1 SBP: 144 mmHg
Stage 1 Speed: 0 mph
Stage 3 Grade: 0 %
Stage 3 HR: 81 {beats}/min
Stage 3 Speed: 1 mph
Stage 4 HR: 81 {beats}/min
Stage 5 DBP: 71 mmHg
Stage 5 SBP: 177 mmHg
Stage 6 SBP: 210 mmHg
Stage 6 Speed: 2.5 mph
Stage 7 Grade: 14 %
Stage 7 Speed: 2.8 mph
Stage 8 Grade: 0 %
Stage 8 HR: 123 {beats}/min
Stage 8 Speed: 0 mph
Stage 9 DBP: 84 mmHg
Stage 9 Grade: 0 %
Stage 9 SBP: 176 mmHg
Stage 9 Speed: 0 mph

## 2017-06-24 NOTE — Progress Notes (Signed)
She almost reached target.  I do not think that stress perfusion study is indicated.  Call Ms. Cunning with the results and send results to [name], MD

## 2017-07-04 ENCOUNTER — Other Ambulatory Visit: Payer: Self-pay | Admitting: Family Medicine

## 2017-07-05 NOTE — Telephone Encounter (Signed)
Last seen 04/05/17  DWM  Requesting 90 day supply

## 2017-08-01 ENCOUNTER — Other Ambulatory Visit: Payer: Self-pay | Admitting: Nurse Practitioner

## 2017-08-14 ENCOUNTER — Ambulatory Visit (INDEPENDENT_AMBULATORY_CARE_PROVIDER_SITE_OTHER): Payer: Medicare Other | Admitting: Family Medicine

## 2017-08-14 ENCOUNTER — Ambulatory Visit (INDEPENDENT_AMBULATORY_CARE_PROVIDER_SITE_OTHER): Payer: Medicare Other

## 2017-08-14 ENCOUNTER — Encounter: Payer: Self-pay | Admitting: Family Medicine

## 2017-08-14 VITALS — BP 132/85 | HR 77 | Temp 97.5°F | Ht 65.0 in | Wt 193.0 lb

## 2017-08-14 DIAGNOSIS — M797 Fibromyalgia: Secondary | ICD-10-CM | POA: Diagnosis not present

## 2017-08-14 DIAGNOSIS — M150 Primary generalized (osteo)arthritis: Secondary | ICD-10-CM | POA: Diagnosis not present

## 2017-08-14 DIAGNOSIS — C539 Malignant neoplasm of cervix uteri, unspecified: Secondary | ICD-10-CM | POA: Diagnosis not present

## 2017-08-14 DIAGNOSIS — E7800 Pure hypercholesterolemia, unspecified: Secondary | ICD-10-CM | POA: Diagnosis not present

## 2017-08-14 DIAGNOSIS — M5136 Other intervertebral disc degeneration, lumbar region: Secondary | ICD-10-CM | POA: Diagnosis not present

## 2017-08-14 DIAGNOSIS — E559 Vitamin D deficiency, unspecified: Secondary | ICD-10-CM

## 2017-08-14 DIAGNOSIS — J449 Chronic obstructive pulmonary disease, unspecified: Secondary | ICD-10-CM

## 2017-08-14 DIAGNOSIS — Z23 Encounter for immunization: Secondary | ICD-10-CM

## 2017-08-14 DIAGNOSIS — E8801 Alpha-1-antitrypsin deficiency: Secondary | ICD-10-CM | POA: Diagnosis not present

## 2017-08-14 DIAGNOSIS — J42 Unspecified chronic bronchitis: Secondary | ICD-10-CM | POA: Diagnosis not present

## 2017-08-14 DIAGNOSIS — I700 Atherosclerosis of aorta: Secondary | ICD-10-CM | POA: Diagnosis not present

## 2017-08-14 DIAGNOSIS — J8410 Pulmonary fibrosis, unspecified: Secondary | ICD-10-CM | POA: Diagnosis not present

## 2017-08-14 DIAGNOSIS — Z780 Asymptomatic menopausal state: Secondary | ICD-10-CM | POA: Diagnosis not present

## 2017-08-14 DIAGNOSIS — M810 Age-related osteoporosis without current pathological fracture: Secondary | ICD-10-CM | POA: Diagnosis not present

## 2017-08-14 NOTE — Patient Instructions (Addendum)
Medicare Annual Wellness Visit  [facility] and the medical providers at [facility] strive to bring you the best medical care.  In doing so we not only want to address your current medical conditions and concerns but also to detect new conditions early and prevent illness, disease and health-related problems.    Medicare offers a yearly Wellness Visit which allows our clinical staff to assess your need for preventative services including immunizations, lifestyle education, counseling to decrease risk of preventable diseases and screening for fall risk and other medical concerns.    This visit is provided free of charge (no copay) for all Medicare recipients. The clinical pharmacists at [facility] have begun to conduct these Wellness Visits which will also include a thorough review of all your medications.    As you primary medical provider recommend that you make an appointment for your Annual Wellness Visit if you have not done so already this year.  You may set up this appointment before you leave today or you may call back ([phone]) and schedule an appointment.  Please make sure when you call that you mention that you are scheduling your Annual Wellness Visit with the clinical pharmacist so that the appointment may be made for the proper length of time.     Continue current medications. Continue good therapeutic lifestyle changes which include good diet and exercise. Fall precautions discussed with patient. If an FOBT was given today- please return it to our front desk. If you are over 35 years old - you may need Prevnar 51 or the adult Pneumonia vaccine.  **Flu shots are available--- please call and schedule a FLU-CLINIC appointment**  After your visit with [place] today you will receive a survey in the mail or online from [employer] regarding your care with [place]. Please take a moment to fill this out. Your feedback is very  important to [place] as you can help [place] better understand your patient needs as well as improve your experience and satisfaction. WE CARE ABOUT YOU!!!   She should continue to follow-up with the pulmonologist as he determines that need With the upcoming winter months she should avoid large crowds of people and should stay well hydrated and drink plenty of fluids

## 2017-08-14 NOTE — Progress Notes (Signed)
Subjective:    Patient ID: [name], female    DOB: 12-19-1955, 61 y.o.   MRN: [id number]  HPI Pt here for follow up and management of chronic medical problems which includes hyperlipidemia and hypertension. She is taking medication regularly.This patient is doing well overall. She is followed regularly by Dr. Baird Lyons because of her alpha 1 antitrypsin deficiency and COPD. She is due to get her pelvic exam next month and is due a DEXA scan. She will get re-given an FOBT today and will get lab work today. She has no specific complaints. Her vital signs are stable. The patient has a history of having had cervical cancer in 2004. She does also have osteoporosis. She takes omeprazole for her GERD. The patient is pleasant and relaxed and comes to the visit today with her husband. She understands the importance of avoiding irritating environments. She denies any chest pain or any more shortness of breath than usual. She sees the pulmonologist every 6 months. She denies any trouble with her stomach including nausea vomiting diarrhea blood in the stool or black tarry bowel movements. The omeprazole does help her control her reflux symptoms. She is up-to-date on her colonoscopies. She is passing her water without problems.    Patient Active Problem List   Diagnosis Date Noted  . Aortic atherosclerosis ([place]) 05/31/2017  . Essential hypertension 05/31/2017  . Dyslipidemia 05/31/2017  . Pulmonary fibrosis, unspecified ([facility]) 09/29/2016  . Exposure to hepatitis C 09/12/2016  . Hyperlipidemia 10/06/2013  . Osteoporosis 06/25/2013  . Rhinitis, nonallergic 05/08/2012  . 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 DYSPNEA 06/24/2008  . Obstructive sleep apnea 06/02/2008  . Alpha-1-antitrypsin deficiency ([facility]) 12/06/2007  . COPD mixed type ([place]) 09/14/2007  . Fibromyalgia 09/14/2007   Outpatient Encounter Prescriptions as of 08/14/2017  Medication Sig  . albuterol (PROVENTIL) (2.5 MG/3ML) 0.083%  nebulizer solution Take 3 mLs (2.5 mg total) by nebulization every 6 (six) hours as needed.  Marland Kitchen alpha-1-proteinase inhibitor, human, (ZEMAIRA) 1000 MG SOLR Inject 60 mg/kg into the vein once a week.   Marland Kitchen amitriptyline (ELAVIL) 75 MG tablet Take 75 mg by mouth at bedtime.    Marland Kitchen ezetimibe (ZETIA) 10 MG tablet TAKE 1 TABLET ONCE A DAY  . FLUoxetine (PROZAC) 20 MG capsule TAKE (2) CAPSULES DAILY.  . fluticasone (FLONASE) 50 MCG/ACT nasal spray USE 2 SPRAYS IN EACH NOSTRIL ONCE DAILY.  . furosemide (LASIX) 20 MG tablet 1 daily for occasional use - diuretic  . guaiFENesin (MUCINEX) 600 MG 12 hr tablet Take 1,200 mg by mouth 2 (two) times daily.    Marland Kitchen HYDROcodone-acetaminophen (NORCO/VICODIN) 5-325 MG per tablet Take 1 tablet by mouth 3 (three) times daily.  . meclizine (ANTIVERT) 12.5 MG tablet Take 1 tablet (12.5 mg total) by mouth 3 (three) times daily as needed for dizziness.  . Nebulizers (COMPRESSOR NEBULIZER) MISC 1 Device by Does not apply route as needed.  Marland Kitchen omeprazole (PRILOSEC) 40 MG capsule TAKE (1) CAPSULE DAILY  . PROAIR HFA 108 (90 Base) MCG/ACT inhaler 2 PUFFS EVERY 4 HOURS AS NEEDED FOR WHEEZING  . rosuvastatin (CRESTOR) 40 MG tablet TAKE 1/2 TABLET ONCE DAILY  . SPIRIVA HANDIHALER 18 MCG inhalation capsule INHALE 1 CAPSULE DAILY  . SYMBICORT 80-4.5 MCG/ACT inhaler 2 PUFFS 2 TIMES A DAY   No facility-administered encounter medications on file as of 08/14/2017.       Review of Systems  Constitutional: Negative.   HENT: Negative.   Eyes: Negative.   Respiratory:  Negative.   Cardiovascular: Negative.   Gastrointestinal: Negative.   Endocrine: Negative.   Genitourinary: Negative.   Musculoskeletal: Negative.   Skin: Negative.   Allergic/Immunologic: Negative.   Neurological: Negative.   Hematological: Negative.   Psychiatric/Behavioral: Negative.        Objective:   Physical Exam  Constitutional: She is oriented to person, place, and time. She appears well-developed and  well-nourished. No distress.  The patient is pleasant and alert.  HENT:  Head: Normocephalic and atraumatic.  Right Ear: External ear normal.  Left Ear: External ear normal.  Mouth/Throat: Oropharynx is clear and moist. No oropharyngeal exudate.  Slight nasal congestion bilaterally  Eyes: Pupils are equal, round, and reactive to light. Conjunctivae and EOM are normal. Right eye exhibits no discharge. Left eye exhibits no discharge. No scleral icterus.  The patient sees the eye doctor regularly every 2 years her last visit was in 2017.  Neck: Normal range of motion. Neck supple. No thyromegaly present.  No bruits thyromegaly or anterior cervical adenopathy  Cardiovascular: Normal rate, regular rhythm, normal heart sounds and intact distal pulses.   No murmur heard. The heart is regular at 60/m  Pulmonary/Chest: Effort normal. No respiratory distress. She has no wheezes. She has no rales.  The patient has basilar crackles bilaterally. This is been typical for her physical exam in the past.  Abdominal: Soft. Bowel sounds are normal. She exhibits no mass. There is no tenderness. There is no rebound and no guarding.  No abdominal tenderness masses or organ enlargement or bruits  Genitourinary:  Genitourinary Comments: Pelvic exam is coming up soon for this patient in October.  Musculoskeletal: Normal range of motion. She exhibits no edema.  Lymphadenopathy:    She has no cervical adenopathy.  Neurological: She is alert and oriented to person, place, and time. She has normal reflexes. No cranial nerve deficit.  Skin: Skin is warm and dry. No rash noted.  Psychiatric: She has a normal mood and affect. Her behavior is normal. Judgment and thought content normal.  Nursing note and vitals reviewed.   BP 132/85 (BP Location: Left Arm)   Pulse 77   Temp (!) 97.5 F (36.4 C) (Oral)   Ht _0  (1.651 m)   Wt 193 lb (87.5 kg)   BMI 32.12 kg/m       Assessment & Plan:  1. Pure  hypercholesterolemia -Continue with current treatment and as aggressive therapeutic lifestyle changes as possible - CBC with Differential/Platelet - BMP8+EGFR - Lipid panel - Hepatic function panel  2. Vitamin D deficiency -Continue with vitamin D replacement pending results of lab work - DG [facility]; Future - CBC with Differential/Platelet - VITAMIN D 25 Hydroxy (Vit-D Deficiency, Fractures)  3. Aortic atherosclerosis ([place]) -Continue with Crestor and aggressive therapeutic lifestyle changes - CBC with Differential/Platelet  4. Fibromyalgia -Continue with exercise as much as possible and pain medicine as needed - CBC with Differential/Platelet  5. Alpha-1-antitrypsin deficiency ([place]) -Continue follow-up with pulmonology every 6 months, Dr. Baird Lyons - CBC with Differential/Platelet  6. COPD mixed type ([facility]) -Continue with respiratory protection, inhalers, lots of fluids, Mucinex as needed, and regular follow-up with Dr. Florene Route - DG [facility] DEXA; Future - CBC with Differential/Platelet  7. Malignant neoplasm of cervix, unspecified site [facility]) -Get GYN exams regularly  8. Postinflammatory pulmonary fibrosis ([facility]) -Follow-up with pulmonology as planned Follow-up with pulmonology as planned  9. Chronic bronchitis, unspecified chronic bronchitis type ([place]) -Continue all medications and follow-up regularly with [place]  and pulmonologist  Patient Instructions                       Medicare Annual Wellness Visit  [facility] and the medical providers at [facility] strive to bring you the best medical care.  In doing so we not only want to address your current medical conditions and concerns but also to detect new conditions early and prevent illness, disease and health-related problems.    Medicare offers a yearly Wellness Visit which allows our clinical staff to assess your need for preventative services including immunizations, lifestyle education,  counseling to decrease risk of preventable diseases and screening for fall risk and other medical concerns.    This visit is provided free of charge (no copay) for all Medicare recipients. The clinical pharmacists at [facility] have begun to conduct these Wellness Visits which will also include a thorough review of all your medications.    As you primary medical provider recommend that you make an appointment for your Annual Wellness Visit if you have not done so already this year.  You may set up this appointment before you leave today or you may call back ([phone]) and schedule an appointment.  Please make sure when you call that you mention that you are scheduling your Annual Wellness Visit with the clinical pharmacist so that the appointment may be made for the proper length of time.     Continue current medications. Continue good therapeutic lifestyle changes which include good diet and exercise. Fall precautions discussed with patient. If an FOBT was given today- please return it to our front desk. If you are over 3 years old - you may need Prevnar 89 or the adult Pneumonia vaccine.  **Flu shots are available--- please call and schedule a FLU-CLINIC appointment**  After your visit with [place] today you will receive a survey in the mail or online from [employer] regarding your care with [place]. Please take a moment to fill this out. Your feedback is very important to [place] as you can help [place] better understand your patient needs as well as improve your experience and satisfaction. WE CARE ABOUT YOU!!!   She should continue to follow-up with the pulmonologist as he determines that need With the upcoming winter months she should avoid large crowds of people and should stay well hydrated and drink plenty of fluids     Arrie Senate MD

## 2017-08-15 LAB — CBC WITH DIFFERENTIAL/PLATELET
BASOS: 0 %
Basophils Absolute: 0 10*3/uL (ref 0.0–0.2)
EOS (ABSOLUTE): 0.1 10*3/uL (ref 0.0–0.4)
Eos: 2 %
HEMATOCRIT: 41 % (ref 34.0–46.6)
Hemoglobin: 13.4 g/dL (ref 11.1–15.9)
IMMATURE GRANS (ABS): 0 10*3/uL (ref 0.0–0.1)
Immature Granulocytes: 0 %
LYMPHS: 36 %
Lymphocytes Absolute: 2.6 10*3/uL (ref 0.7–3.1)
MCH: 29.1 pg (ref 26.6–33.0)
MCHC: 32.7 g/dL (ref 31.5–35.7)
MCV: 89 fL (ref 79–97)
Monocytes Absolute: 0.7 10*3/uL (ref 0.1–0.9)
Monocytes: 10 %
NEUTROS ABS: 3.8 10*3/uL (ref 1.4–7.0)
Neutrophils: 52 %
PLATELETS: 297 10*3/uL (ref 150–379)
RBC: 4.6 x10E6/uL (ref 3.77–5.28)
RDW: 15.5 % — ABNORMAL HIGH (ref 12.3–15.4)
WBC: 7.2 10*3/uL (ref 3.4–10.8)

## 2017-08-15 LAB — LIPID PANEL
CHOL/HDL RATIO: 3 ratio (ref 0.0–4.4)
Cholesterol, Total: 149 mg/dL (ref 100–199)
HDL: 49 mg/dL (ref >39)
LDL Calculated: 76 mg/dL (ref 0–99)
Triglycerides: 120 mg/dL (ref 0–149)
VLDL CHOLESTEROL CAL: 24 mg/dL (ref 5–40)

## 2017-08-15 LAB — HEPATIC FUNCTION PANEL
ALT: 20 IU/L (ref 0–32)
AST: 30 IU/L (ref 0–40)
Albumin: 4.2 g/dL (ref 3.5–5.5)
Alkaline Phosphatase: 131 IU/L — ABNORMAL HIGH (ref 39–117)
Bilirubin Total: 0.3 mg/dL (ref 0.0–1.2)
Bilirubin, Direct: 0.09 mg/dL (ref 0.00–0.40)
Total Protein: 7.4 g/dL (ref 6.0–8.5)

## 2017-08-15 LAB — BMP8+EGFR
BUN / CREAT RATIO: 11 (ref 9–23)
BUN: 8 mg/dL (ref 6–24)
CHLORIDE: 98 mmol/L (ref 96–106)
CO2: 24 mmol/L (ref 20–29)
CREATININE: 0.72 mg/dL (ref 0.57–1.00)
Calcium: 9.3 mg/dL (ref 8.7–10.2)
GFR, EST AFRICAN AMERICAN: 106 mL/min/{1.73_m2} (ref >59)
GFR, EST NON AFRICAN AMERICAN: 92 mL/min/{1.73_m2} (ref >59)
Glucose: 100 mg/dL — ABNORMAL HIGH (ref 65–99)
Potassium: 4.3 mmol/L (ref 3.5–5.2)
Sodium: 139 mmol/L (ref 134–144)

## 2017-08-15 LAB — VITAMIN D 25 HYDROXY (VIT D DEFICIENCY, FRACTURES): VIT D 25 HYDROXY: 57.2 ng/mL (ref 30.0–100.0)

## 2017-08-28 ENCOUNTER — Other Ambulatory Visit: Payer: Self-pay | Admitting: Family Medicine

## 2017-09-09 NOTE — Progress Notes (Signed)
Cardiology Office Note   Date:  09/11/2017   ID:  Jessica, Rose [birth year], MRN [id number]  PCP:  [name], MD  Cardiologist:   Minus Breeding, MD  Referring:  [name], MD  Chief Complaint  Patient presents with  . Shortness of Breath     History of Present Illness: [name] is a 61 y.o. female who is referred by Dr. Laurance Flatten for evaluation of aortic atherosclerosis.  She has no prior cardiac history.  She has multiple medical problems as below.   When I first saw her she had some dyspnea.  I sent her for a POET (Plain Old Exercise Treadmill).  There was no evidence of ischemia bu her heart rate wast not at target. At this point the pretest probability of obstructive CAD was low and I opted not to perform a perfusion study.  Since then she has done well.  The patient denies any new symptoms such as chest discomfort, neck or arm discomfort. There has been no new shortness of breath, PND or orthopnea. There have been no reported palpitations, presyncope or syncope.  She had her garden and canned and does have some DOE related to her COPD.  However, this is at baseline.  The patient denies any new symptoms such as chest discomfort, neck or arm discomfort. There has been no new shortness of breath, PND or orthopnea. There have been no reported palpitations, presyncope or syncope.   Past Medical History:  Diagnosis Date  . Alpha-1-antitrypsin deficiency ([place])   . Cervical cancer ([place]) 2004  . 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 This doesn't fit entirely with emphysema.      . Exposure to hepatitis C 09/12/2016  . Fibromyalgia   . GERD (gastroesophageal reflux disease)   . Hyperlipidemia   . IBS (irritable bowel syndrome)   . Obstructive sleep apnea 06/02/2008   NPSG 06/07/08, AHI 6.9/ hr,  weight 220 lbs    . Osteoporosis   . Pulmonary fibrosis, unspecified ([place]) 09/29/2016   CXR 03/28/2016  . Rhinitis, nonallergic 05/08/2012  . 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 Vitamin D deficiency     Past Surgical History:  Procedure Laterality Date  . ABDOMINAL HYSTERECTOMY    . LUMBAR DISC SURGERY       Current Outpatient Prescriptions  Medication Sig Dispense Refill  . albuterol (PROVENTIL) (2.5 MG/3ML) 0.083% nebulizer solution Take 3 mLs (2.5 mg total) by nebulization every 6 (six) hours as needed. 90 mL 3  . alpha-1-proteinase inhibitor, human, (ZEMAIRA) 1000 MG SOLR Inject 60 mg/kg into the vein once a week.     Marland Kitchen amitriptyline (ELAVIL) 75 MG tablet Take 75 mg by mouth at bedtime.      Marland Kitchen ezetimibe (ZETIA) 10 MG tablet TAKE 1 TABLET ONCE A DAY 30 tablet 5  . FLUoxetine (PROZAC) 20 MG capsule TAKE (2) CAPSULES DAILY. 180 capsule 0  . fluticasone (FLONASE) 50 MCG/ACT nasal spray USE 2 SPRAYS IN EACH NOSTRIL ONCE DAILY. 16 g 5  . furosemide (LASIX) 20 MG tablet 1 daily for occasional use - diuretic 30 tablet 11  . guaiFENesin (MUCINEX) 600 MG 12 hr tablet Take 1,200 mg by mouth 2 (two) times daily.      Marland Kitchen HYDROcodone-acetaminophen (NORCO/VICODIN) 5-325 MG per tablet  Take 1 tablet by mouth 3 (three) times daily.    . meclizine (ANTIVERT) 12.5 MG tablet Take 1 tablet (12.5 mg total) by mouth 3 (three) times daily as needed for dizziness. 30 tablet 1  . Nebulizers (COMPRESSOR NEBULIZER) MISC 1 Device by Does not apply route as needed. 1 each prn  . omeprazole (PRILOSEC) 40 MG capsule TAKE (1) CAPSULE DAILY 30 capsule 5  . PROAIR HFA 108 (90 Base) MCG/ACT inhaler 2 PUFFS EVERY 4 HOURS AS NEEDED FOR WHEEZING 8.5 g 2  . rosuvastatin (CRESTOR) 40 MG tablet TAKE 1/2 TABLET ONCE DAILY 45 tablet 1  . SPIRIVA HANDIHALER 18 MCG inhalation capsule INHALE 1 CAPSULE DAILY 30 capsule 5  . SYMBICORT 80-4.5 MCG/ACT inhaler 2 PUFFS 2 TIMES A DAY 10.2 g 5  .  chlorthalidone (HYGROTON) 25 MG tablet Take 0.5 tablets (12.5 mg total) by mouth daily. 30 tablet 3   No current facility-administered medications for this visit.     Allergies:   Penicillins; Actonel [risedronate sodium]; Advair diskus [fluticasone-salmeterol]; Alendronate sodium; Aspirin; Levofloxacin; Morphine; Nsaids; Omnicef [cefdinir]; Erythromycin; and Iodine     ROS:  Please see the history of present illness.   Otherwise, review of systems are positive for none.   All other systems are reviewed and negative.    PHYSICAL EXAM: VS:  BP (!) 142/80   Pulse 82   Ht 5\' 5"  (1.651 m)   Wt 191 lb 9.6 oz (86.9 kg)   BMI 31.88 kg/m  , BMI Body mass index is 31.88 kg/m.  GENERAL:  Well appearing NECK:  No jugular venous distention, waveform within normal limits, carotid upstroke brisk and symmetric, no bruits, no thyromegaly LUNGS:  Decreased breath sounds bilaterally.   CHEST:  Unremarkable HEART:  PMI not displaced or sustained,S1 and S2 within normal limits, no S3, no S4, no clicks, no rubs, no murmurs ABD:  Flat, positive bowel sounds normal in frequency in pitch, no bruits, no rebound, no guarding, no midline pulsatile mass, no hepatomegaly, no splenomegaly EXT:  2 plus pulses throughout, no edema, no cyanosis no clubbing    EKG:  EKG is not ordered today. The ekg ordered 05/30/17 demonstrates sinus rhythm, rate 82 , axis within normal limits, intervals within normal limits, no acute ST-T wave changes.   Recent Labs: 08/14/2017: ALT 20; BUN 8; Creatinine, Ser 0.72; Hemoglobin 13.4; Platelets 297; Potassium 4.3; Sodium 139    Lipid Panel    Component Value Date/Time   CHOL 149 08/14/2017 0950   CHOL 200 (H) 02/13/2013 1523   TRIG 120 08/14/2017 0950   TRIG 135 04/05/2017 0918   TRIG 175 (H) 02/13/2013 1523   HDL 49 08/14/2017 0950   HDL 50 04/05/2017 0918   HDL 49 02/13/2013 1523   CHOLHDL 3.0 08/14/2017 0950   LDLCALC 76 08/14/2017 0950   LDLCALC 89 05/26/2014 1030     LDLCALC 116 (H) 02/13/2013 1523      Wt Readings from Last 3 Encounters:  09/10/17 191 lb 9.6 oz (86.9 kg)  08/14/17 193 lb (87.5 kg)  06/12/17 192 lb 3.2 oz (87.2 kg)      Other studies Reviewed: Additional studies/ records that were reviewed today include: None. Review of the above records demonstrates:      ASSESSMENT AND PLAN:  AORTIC ATHEROSCLEROSIS:  The patient has no new sypmtoms.  No further cardiovascular testing is indicated.  We will continue with aggressive risk reduction and meds as listed.  She had a submaximal test by  2% but no ongoing symptoms so I don't think that further testing is indicated.   DYSPNEA:  This is likely pulmonary and I will  She is a very strong family history. She certainly has risk factors and evidence of atherosclerosis as above. This could certainly be an anginal equivalent. I will bring the patient back for a POET (Plain Old Exercise Test). This will allow me to screen for obstructive coronary disease, risk stratify and very importantly provide a prescription for exercise.  HTN:  The blood pressure is up.  I will add chlorthalidone 12.5 mg daily and she will keep a BP diary.  Further treatment changes will be based on future readings.   DYSLIPIDEMIA:  Her LDL is 76.  She will remain on the meds as listed.   Current medicines are reviewed at length with the patient today.  The patient does not have concerns regarding medicines.  The following changes have been made:   As above.   Labs/ tests ordered today include:  None  No orders of the defined types were placed in this encounter.    Disposition:   FU with me in one year.     Signed, Minus Breeding, MD  09/11/2017 8:25 PM    [facility]

## 2017-09-10 ENCOUNTER — Encounter: Payer: Self-pay | Admitting: Cardiology

## 2017-09-10 ENCOUNTER — Ambulatory Visit (INDEPENDENT_AMBULATORY_CARE_PROVIDER_SITE_OTHER): Payer: Medicare Other | Admitting: Cardiology

## 2017-09-10 VITALS — BP 142/80 | HR 82 | Ht 65.0 in | Wt 191.6 lb

## 2017-09-10 DIAGNOSIS — R0600 Dyspnea, unspecified: Secondary | ICD-10-CM

## 2017-09-10 DIAGNOSIS — E785 Hyperlipidemia, unspecified: Secondary | ICD-10-CM

## 2017-09-10 DIAGNOSIS — I700 Atherosclerosis of aorta: Secondary | ICD-10-CM | POA: Diagnosis not present

## 2017-09-10 DIAGNOSIS — I10 Essential (primary) hypertension: Secondary | ICD-10-CM | POA: Diagnosis not present

## 2017-09-10 MED ORDER — CHLORTHALIDONE 25 MG PO TABS: 12.5000 mg | ORAL_TABLET | Freq: Every day | ORAL | 3 refills | Status: DC

## 2017-09-10 NOTE — Patient Instructions (Signed)
Medication Instructions:  START- Chlorthalidone 12.5 mg daily  If you need a refill on your cardiac medications before your next appointment, please call your pharmacy.  Labwork: None Ordered  Testing/Procedures: None Ordered   Follow-Up: Your physician wants you to follow-up in: 1 Year. You should receive a reminder letter in the mail two months in advance. If you do not receive a letter, please call our office [phone].    Thank you for choosing [facility] at [facility]!!

## 2017-09-11 ENCOUNTER — Encounter: Payer: Self-pay | Admitting: Cardiology

## 2017-09-20 DIAGNOSIS — Z124 Encounter for screening for malignant neoplasm of cervix: Secondary | ICD-10-CM | POA: Diagnosis not present

## 2017-10-08 DIAGNOSIS — R87612 Low grade squamous intraepithelial lesion on cytologic smear of cervix (LGSIL): Secondary | ICD-10-CM | POA: Diagnosis not present

## 2017-10-23 ENCOUNTER — Ambulatory Visit (INDEPENDENT_AMBULATORY_CARE_PROVIDER_SITE_OTHER): Payer: Medicare Other | Admitting: *Deleted

## 2017-10-23 ENCOUNTER — Other Ambulatory Visit: Payer: Self-pay | Admitting: Family Medicine

## 2017-10-25 ENCOUNTER — Telehealth: Payer: Self-pay | Admitting: Internal Medicine

## 2017-10-25 NOTE — Telephone Encounter (Signed)
Spoke with patient. She stated that there is a shortage of Zemaira due to the manufacturer having to pull the product from stock due to contamination. She currently has enough for one more injection.   She wants to see if CY would switch her to something else.   CY, please advise. Thanks!

## 2017-10-26 NOTE — Telephone Encounter (Signed)
Can supplier give [place] any idea how long this will go on? Not really a problem if it were to be just a month.  Any longer or if no idea how long, then we should see what others are covered by her insurance. Ideally, it should be given by the same nursing/ homecare company that administers her infusions now.

## 2017-10-31 NOTE — Telephone Encounter (Signed)
Attempted to call pt. Received a fast busy signal x2. Will try back.

## 2017-11-01 NOTE — Telephone Encounter (Signed)
Spoke with pt, she states they may be getting the medication now because they are going to ship her two doses. She states she will keep [place] updated with the shipments and has not missed a dose yet. I advised her of CY message and pt understood. Nothing further is needed. FYI CY

## 2017-11-05 ENCOUNTER — Telehealth: Payer: Self-pay | Admitting: Internal Medicine

## 2017-11-05 NOTE — Telephone Encounter (Signed)
Spoke with Will with [facility], who states he will fax over Rx request.   Will route to KW to f/u on.

## 2017-11-05 NOTE — Telephone Encounter (Signed)
Pt aware that we are currently working on Rx refill. Pt aware and voiced her understanding.  Nothing further is needed.

## 2017-11-06 NOTE — Telephone Encounter (Signed)
Rx was signed by CY today and I have faxed to pharmacy. Confirmation rec'd; pharmacy is aware of this as well. Nothing more needed at this time.

## 2017-11-08 DIAGNOSIS — M5136 Other intervertebral disc degeneration, lumbar region: Secondary | ICD-10-CM | POA: Diagnosis not present

## 2017-11-08 DIAGNOSIS — M150 Primary generalized (osteo)arthritis: Secondary | ICD-10-CM | POA: Diagnosis not present

## 2017-11-08 DIAGNOSIS — M810 Age-related osteoporosis without current pathological fracture: Secondary | ICD-10-CM | POA: Diagnosis not present

## 2017-11-08 DIAGNOSIS — M25512 Pain in left shoulder: Secondary | ICD-10-CM | POA: Diagnosis not present

## 2017-11-08 DIAGNOSIS — M797 Fibromyalgia: Secondary | ICD-10-CM | POA: Diagnosis not present

## 2017-11-19 ENCOUNTER — Telehealth: Payer: Self-pay | Admitting: Internal Medicine

## 2017-11-19 NOTE — Telephone Encounter (Signed)
PA started:  Key:[id number]  [facility] is reviewing your PA request. Typically an electronic response will be received within 72 hours. To check for an update later, open this request from your dashboard.  You may close this dialog and return to your dashboard to perform other tasks.

## 2017-11-21 NOTE — Telephone Encounter (Signed)
Checked status of PA via [email or web address], states that PA is denied d/t not meeting criteria needed for PA requirements.  Called [facility] at [phone] and spoke with Joelene Millin, states that incomplete info was provided through [facility].  We are unable to provide additional info to PA, but must appeal this decision.    A letter to appeal this decision must be faxed to the Part D appeals and grievances dept at [phone].  Reference QA#[id number].  CY please advise on appeal letter.  Thanks!

## 2017-11-21 NOTE — Telephone Encounter (Signed)
Appeal forms given to [facility]

## 2017-11-21 NOTE — Telephone Encounter (Signed)
Katie- I don't think I have seen any of this. Need to know what information got left off PA request so I know what to say in the letter.

## 2017-11-22 ENCOUNTER — Telehealth: Payer: Self-pay | Admitting: Internal Medicine

## 2017-11-22 NOTE — Telephone Encounter (Signed)
ATC pt, no answer. Left message for pt to call back.  KW according to previous message, [facility] gave you these forms. Please advise.

## 2017-11-23 ENCOUNTER — Other Ambulatory Visit: Payer: Self-pay | Admitting: Family Medicine

## 2017-11-23 NOTE — Telephone Encounter (Signed)
Spoke with patient regarding [name] Advised pt that PA was sent back in 10/20/17 PA was denied, appeal is in process as of 11/19/17 Pt verbalized understanding; waiting on appeal results Nothing further needed

## 2017-11-23 NOTE — Telephone Encounter (Signed)
Pt is calling back [phone]

## 2017-11-27 NOTE — Telephone Encounter (Signed)
Pt calling back about Zemaira. Per Pt, [facility] stated the paperwork that was turned in by Dr. Annamaria Boots was not the right one. [employer] [phone]. Pt Cb [phone].

## 2017-11-30 NOTE — Progress Notes (Signed)
Paperwork filled out for [employer]

## 2017-12-06 ENCOUNTER — Telehealth: Payer: Self-pay | Admitting: Internal Medicine

## 2017-12-06 NOTE — Telephone Encounter (Signed)
Pt calling to check status of appeal for Zemaira. Pt has been without medication for over 2 weeks and is very concerned that this has not been followed up on. There are several phone notes regarding this.   [employer] is requiring a letter stating: Documentation of positive clinical response to therapy, continued optimal conventional treatment info.   Fax to [phone] [facility] attn [place].  CY Please advise on letter to be sent to [facility].  Thanks.

## 2017-12-06 NOTE — Telephone Encounter (Signed)
Attempted to contact pt. No answer, no option to leave a message. Will try back.  

## 2017-12-06 NOTE — Telephone Encounter (Signed)
Insurance is asking for a letter to appeal their denial for her medicine. I will work on this. We also need to see if her insurance will cover a different brand that would do the same thing.

## 2017-12-07 NOTE — Telephone Encounter (Signed)
Called [employer], was advised that there are no preferred alternatives for this medication.    CY please advise when letter is completed.  Thanks.

## 2017-12-10 NOTE — Telephone Encounter (Signed)
We will discuss this at ov today

## 2017-12-13 ENCOUNTER — Ambulatory Visit: Payer: Medicare Other | Admitting: Internal Medicine

## 2017-12-13 ENCOUNTER — Encounter: Payer: Self-pay | Admitting: Internal Medicine

## 2017-12-13 ENCOUNTER — Ambulatory Visit (INDEPENDENT_AMBULATORY_CARE_PROVIDER_SITE_OTHER): Payer: Medicare Other | Admitting: Internal Medicine

## 2017-12-13 DIAGNOSIS — E8801 Alpha-1-antitrypsin deficiency: Secondary | ICD-10-CM

## 2017-12-13 DIAGNOSIS — Z23 Encounter for immunization: Secondary | ICD-10-CM

## 2017-12-13 DIAGNOSIS — J449 Chronic obstructive pulmonary disease, unspecified: Secondary | ICD-10-CM

## 2017-12-13 LAB — PULMONARY FUNCTION TEST
DL/VA % pred: 92 %
DL/VA: 4.63 ml/min/mmHg/L
DLCO UNC % PRED: 75 %
DLCO UNC: 19.83 ml/min/mmHg
DLCO cor % pred: 72 %
DLCO cor: 19.11 ml/min/mmHg
FEF 25-75 Post: 1.62 L/sec
FEF 25-75 Pre: 0.95 L/sec
FEF2575-%Change-Post: 70 %
FEF2575-%PRED-POST: 66 %
FEF2575-%Pred-Pre: 38 %
FEV1-%CHANGE-POST: 13 %
FEV1-%PRED-POST: 52 %
FEV1-%PRED-PRE: 46 %
FEV1-POST: 1.43 L
FEV1-PRE: 1.26 L
FEV1FVC-%Change-Post: 0 %
FEV1FVC-%PRED-PRE: 96 %
FEV6-%CHANGE-POST: 13 %
FEV6-%PRED-POST: 56 %
FEV6-%Pred-Pre: 49 %
FEV6-PRE: 1.67 L
FEV6-Post: 1.9 L
FEV6FVC-%CHANGE-POST: 0 %
FEV6FVC-%PRED-POST: 101 %
FEV6FVC-%PRED-PRE: 102 %
FVC-%CHANGE-POST: 13 %
FVC-%Pred-Post: 54 %
FVC-%Pred-Pre: 48 %
FVC-Post: 1.92 L
FVC-Pre: 1.68 L
POST FEV1/FVC RATIO: 75 %
PRE FEV6/FVC RATIO: 99 %
Post FEV6/FVC ratio: 99 %
Pre FEV1/FVC ratio: 75 %
RV % PRED: 151 %
RV: 3.11 L
TLC % PRED: 97 %
TLC: 5.17 L

## 2017-12-13 MED ORDER — GLYCOPYRROLATE-FORMOTEROL 9-4.8 MCG/ACT IN AERO: 2.0000 | INHALATION_SPRAY | Freq: Two times a day (BID) | RESPIRATORY_TRACT | 0 refills | Status: DC

## 2017-12-13 NOTE — Progress Notes (Signed)
PFT done today. 

## 2017-12-13 NOTE — Progress Notes (Signed)
HPI  23 yoF former smoker on replacement for a1AT deficiency, with severe COPD, complicated by OSA and fibromyalgia. PFT in 2014 showed moderate obstructive airways disease with normal diffusion Zemaira alpha 1 replacement NPSG 2009-Minimal OSA AHI 6.9/hour, not treated PFT 12/13/17-moderately severe obstructive airways disease, airtrapping, diffusion mildly reduced, slight response to dilator.  FVC 1.92/54%, FEV1 1.43/52%, ratio 0.75, FEF 25-75% 1.62/66%, TLC 97%, DLCO 75%. -------------------------------------------------------------------------------------  06/12/17- 62 year old female former smoker on replacement for off a-1- antitrypsin deficiency with severe COPD, ?ILD, complicated by mild OSA/not treated, fibromyalgia,  Zemaira alpha 1 replacement FOLLOWS FOR: Pt states she has become more SOB with activity; Has had to start using nebulizer Rx at least once a day. Dr. Hochrein/cardiology has scheduled an exercise tolerance test in August. Using nebulizer machine/albuterol, Spiriva, Symbicort 80, pro air She and her husband say she's been somewhat more short of breath recently with exertion but blames it on" humidity". No routine cough or wheeze and no acute events. She can tell Symbicort helps if she misses a dose. Very comfortable with [facility] for [place].  12/13/17- 62 year old female former smoker on replacement for off a-1- antitrypsin deficiency with severe COPD, ?ILD, complicated by mild OSA/not treated, fibromyalgia,                           Zemaira alpha 1 replacement-  Being changed Her insurance stopped covering any of the alpha-1 replacement products but advised that we could start new application process/prior auth for Prolastin- now in progress. ----follow up PFT Symbicort 80, Spiriva, pro-air, nebulizer albuterol Some dry cough times 1 month with no wheezing, no fever. CXR 06/12/17- IMPRESSION: 1. No pneumonia or effusion.  Stable scarring at the lung bases. 2.  Peribronchial thickening and hyperaeration may indicate emphysema and/or chronic bronchitis. PFT 12/13/17-moderately severe obstructive airways disease, airtrapping, diffusion mildly reduced, slight response to dilator.  FVC 1.92/54%, FEV1 1.43/52%, ratio 0.75, FEF 25-75% 1.62/66%, TLC 97%, DLCO 75%.  ROS-see HPI   + = pos Constitutional:   No-   weight loss, night sweats, fevers, chills, fatigue, lassitude. HEENT:   No-  headaches, difficulty swallowing, tooth/dental problems, sore throat,       No-  sneezing, itching, ear ache, nasal congestion, post nasal drip,  CV:  No-   chest pain, orthopnea, PND, swelling in lower extremities, anasarca, dizziness, palpitations Resp: +shortness of breath with exertion or at rest.              No-productive cough,  +non-productive cough,  No- coughing up of blood.              No-change in color of mucus.  No- wheezing.   Skin: No-   rash or lesions. GI:  No-   heartburn, indigestion, abdominal pain, nausea, vomiting, GU:  MS:  No-   joint pain or swelling.   Neuro-     nothing unusual Psych:  No- change in mood or affect. No depression or anxiety.  No memory loss.   Objective:   Physical Exam    Stable baseline exam General- Alert, Oriented, Affect-appropriate, Distress- none acute, + weight obese Skin- rash-none, lesions- none, excoriation- none Lymphadenopathy- none Head- atraumatic            Eyes- Gross vision intact, PERRLA, conjunctivae clear secretions            Ears- Hearing, canals-normal            Nose- congested, no-Septal dev, mucus,  polyps, erosion, perforation             Throat- Mallampati II , mucosa clear , drainage- none,                              tonsils- atrophic, +dentures Neck- flexible , trachea midline, no stridor , thyroid nl, carotid no bruit Chest - symmetrical excursion , unlabored           Heart/CV- RRR , no murmur , no gallop  , no rub, nl s1 s2                           - JVD- none , edema- none, stasis  changes- none, varices- none           Lung- cough+ None ,  rhonchi-None, dullness-none, rub- none. Unlabored, + crackles coarse-in bases           Chest wall-  Abd-  Br/ Gen/ Rectal- Not done, not indicated Extrem- cyanosis- none, clubbing, none, atrophy- none, strength- nl Neuro- grossly intact to observation

## 2017-12-13 NOTE — Patient Instructions (Addendum)
Sample x 2 Bevespi     Inhale 2 puffs, twice daily    Try this instead of Symbicort and Spiriva. If you like the Bevespi better, you can see about filling the printed script.  Otherwise just go right back to Symbicort and Spiriva   Order- Prevnar 13  Pneumonia vaccine  We will keep working for you on the alpha-1 replacement.

## 2017-12-18 ENCOUNTER — Telehealth: Payer: Self-pay | Admitting: Internal Medicine

## 2017-12-18 NOTE — Telephone Encounter (Signed)
Spoke with Jeani Hawking at [employer] that [employer] will not cover [name] and we are changing her over to Prolastin. They will close their file out.

## 2017-12-20 NOTE — Assessment & Plan Note (Signed)
She has maintained pulmonary function test results decline than expected and with better symptom control, as she has been on alpha-1 replacement this needs to continue we discussed says of applying through her insurance to resume therapy.

## 2017-12-20 NOTE — Assessment & Plan Note (Signed)
Reviewed without change.  She is getting through this winter without major probation so far.

## 2017-12-21 ENCOUNTER — Telehealth: Payer: Self-pay | Admitting: Internal Medicine

## 2017-12-21 NOTE — Telephone Encounter (Signed)
Called [name] and left a message on his voicemail to call back.

## 2017-12-24 ENCOUNTER — Telehealth: Payer: Self-pay | Admitting: Internal Medicine

## 2017-12-24 NOTE — Telephone Encounter (Signed)
lmtcb x2 for Nicole Kindred.

## 2017-12-24 NOTE — Telephone Encounter (Signed)
[name] is calling back. Cb is [phone]. Ext [phone]

## 2017-12-24 NOTE — Telephone Encounter (Signed)
Spoke to Woodward with [facility] short stay, who states form is missing medication name.  I have re faxed form with medication name. Laverne states  pt will need to call short stay to get scheduled for infusion. I have spoken with pt and made her aware of this information and provided her with contact number.  Nothing further is needed.

## 2017-12-24 NOTE — Telephone Encounter (Signed)
lmtcb for Nicole Kindred

## 2017-12-24 NOTE — Telephone Encounter (Signed)
Spoke with Nicole Kindred. He stated that due to the patient having a reaction to the medication before in the past, the first infusion will need to be done at the [facility]. As a result of this, the center needs CY to fill out a form.   Spoke with [employer]. She has the form. Will have CY sign it and we will fax it to [facility].   Will keep this message open until the form has been faxed.

## 2017-12-24 NOTE — Telephone Encounter (Signed)
Nicole Kindred calling with an update on the infusion. Per [place] has requested the CY Fill out a form for the pt to receive her infusion done there rather than in home. Cb is [phone]

## 2017-12-24 NOTE — Telephone Encounter (Signed)
Papers have been faxed.

## 2017-12-28 ENCOUNTER — Telehealth: Payer: Self-pay | Admitting: Internal Medicine

## 2017-12-28 MED ORDER — GLYCOPYRROLATE-FORMOTEROL 9-4.8 MCG/ACT IN AERO: 2.0000 | INHALATION_SPRAY | Freq: Two times a day (BID) | RESPIRATORY_TRACT | 5 refills | Status: DC

## 2017-12-28 NOTE — Telephone Encounter (Signed)
Spoke with pt and advised rx sent to pharmacy. Nothing further is needed.   Patient Instructions by Deneise Lever, MD at 12/13/2017 11:00 AM   Author: Deneise Lever, MD Author Type: Physician Filed: 12/13/2017 11:18 AM  Note Status: Addendum Cosign: Cosign Not Required Encounter Date: 12/13/2017  Editor: Deneise Lever, MD (Physician)  Prior Versions: 1. Deneise Lever, MD (Physician) at 12/13/2017 11:12 AM - Signed    Sample x 2 Bevespi     Inhale 2 puffs, twice daily    Try this instead of Symbicort and Spiriva. If you like the Bevespi better, you can see about filling the printed script.  Otherwise just go right back to Symbicort and Spiriva   Order- Prevnar 13  Pneumonia vaccine

## 2017-12-31 ENCOUNTER — Ambulatory Visit (INDEPENDENT_AMBULATORY_CARE_PROVIDER_SITE_OTHER): Payer: Medicare Other | Admitting: Family Medicine

## 2017-12-31 ENCOUNTER — Telehealth: Payer: Self-pay | Admitting: Internal Medicine

## 2017-12-31 ENCOUNTER — Encounter: Payer: Self-pay | Admitting: Family Medicine

## 2017-12-31 VITALS — BP 124/75 | HR 96 | Temp 97.3°F | Ht 65.5 in | Wt 193.0 lb

## 2017-12-31 DIAGNOSIS — E8801 Alpha-1-antitrypsin deficiency: Secondary | ICD-10-CM | POA: Diagnosis not present

## 2017-12-31 DIAGNOSIS — M797 Fibromyalgia: Secondary | ICD-10-CM

## 2017-12-31 DIAGNOSIS — E559 Vitamin D deficiency, unspecified: Secondary | ICD-10-CM | POA: Diagnosis not present

## 2017-12-31 DIAGNOSIS — G4733 Obstructive sleep apnea (adult) (pediatric): Secondary | ICD-10-CM

## 2017-12-31 DIAGNOSIS — J449 Chronic obstructive pulmonary disease, unspecified: Secondary | ICD-10-CM

## 2017-12-31 DIAGNOSIS — C539 Malignant neoplasm of cervix uteri, unspecified: Secondary | ICD-10-CM

## 2017-12-31 DIAGNOSIS — J8410 Pulmonary fibrosis, unspecified: Secondary | ICD-10-CM

## 2017-12-31 DIAGNOSIS — I700 Atherosclerosis of aorta: Secondary | ICD-10-CM | POA: Diagnosis not present

## 2017-12-31 DIAGNOSIS — E7800 Pure hypercholesterolemia, unspecified: Secondary | ICD-10-CM | POA: Diagnosis not present

## 2017-12-31 NOTE — Patient Instructions (Addendum)
Medicare Annual Wellness Visit  [place] and the medical providers at [facility] strive to bring you the best medical care.  In doing so we not only want to address your current medical conditions and concerns but also to detect new conditions early and prevent illness, disease and health-related problems.    Medicare offers a yearly Wellness Visit which allows our clinical staff to assess your need for preventative services including immunizations, lifestyle education, counseling to decrease risk of preventable diseases and screening for fall risk and other medical concerns.    This visit is provided free of charge (no copay) for all Medicare recipients. The clinical pharmacists at [facility] have begun to conduct these Wellness Visits which will also include a thorough review of all your medications.    As you primary medical provider recommend that you make an appointment for your Annual Wellness Visit if you have not done so already this year.  You may set up this appointment before you leave today or you may call back ([phone]) and schedule an appointment.  Please make sure when you call that you mention that you are scheduling your Annual Wellness Visit with the clinical pharmacist so that the appointment may be made for the proper length of time.     Continue current medications. Continue good therapeutic lifestyle changes which include good diet and exercise. Fall precautions discussed with patient. If an FOBT was given today- please return it to our front desk. If you are over 52 years old - you may need Prevnar 40 or the adult Pneumonia vaccine.  **Flu shots are available--- please call and schedule a FLU-CLINIC appointment**  After your visit with [place] today you will receive a survey in the mail or online from [employer] regarding your care with [place]. Please take a moment to fill this out. Your feedback is very  important to [place] as you can help [place] better understand your patient needs as well as improve your experience and satisfaction. WE CARE ABOUT YOU!!!  Continue with planned follow-ups with pulmonology Continue to get pelvic exams regularly Continue to avoid environments that are risky respiratory wise. Continue to drink plenty of fluids and stay well-hydrated Continue with current inhalers as recommended by pulmonology Continue with Mucinex

## 2017-12-31 NOTE — Progress Notes (Signed)
Subjective:    Patient ID: [name], female    DOB: December 30, 1955, 62 y.o.   MRN: [id number]  HPI Pt here for follow up and management of chronic medical problems which includes hyperlipidemia. She is taking medication regularly.  The patient has recently seen the pulmonologist and all so the cardiologist.  Several changes were made in her medications from both of these physicians.  She was changed to chlorthalidone from Lasix.  She was changed to bevespi instead of Symbicort.  An IV infusion is planned for Prolastin once insurance coverage is approved.  Patient comes to the visit today with her husband.  We discussed the notes and visits to the cardiologist and pulmonologist.  Breathing wise, she is stable with the new inhaler and just as good as she was with the Symbicort and Spiriva.  She did switch from Lasix to chlorthalidone.  We will get lab work today and make sure that the pulmonologist and cardiologist to get copies of this lab work.  We will also make sure that her rheumatologist gets a copy of her DEXA scan.  The patient denies any chest pain.  She denies any additional shortness of breath other than her baseline.  She is passing her water well without burning pain frequency.  There is no change in bowel habits and no nausea vomiting diarrhea blood in the stool or black tarry bowel movements.     Patient Active Problem List   Diagnosis Date Noted  . Aortic atherosclerosis ([place]) 05/31/2017  . Essential hypertension 05/31/2017  . Dyslipidemia 05/31/2017  . Pulmonary fibrosis, unspecified ([place]) 09/29/2016  . Exposure to hepatitis C 09/12/2016  . Hyperlipidemia 10/06/2013  . Osteoporosis 06/25/2013  . Rhinitis, nonallergic 05/08/2012  . 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 DYSPNEA 06/24/2008  . Obstructive sleep apnea 06/02/2008  . Alpha-1-antitrypsin deficiency ([place]) 12/06/2007  . COPD mixed type ([place]) 09/14/2007  . Fibromyalgia 09/14/2007   Outpatient Encounter  Medications as of 12/31/2017  Medication Sig  . albuterol (PROVENTIL) (2.5 MG/3ML) 0.083% nebulizer solution Take 3 mLs (2.5 mg total) by nebulization every 6 (six) hours as needed.  Marland Kitchen amitriptyline (ELAVIL) 75 MG tablet Take 75 mg by mouth at bedtime.    . chlorthalidone (HYGROTON) 25 MG tablet Take 0.5 tablets (12.5 mg total) by mouth daily.  Marland Kitchen ezetimibe (ZETIA) 10 MG tablet TAKE 1 TABLET ONCE A DAY  . FLUoxetine (PROZAC) 20 MG capsule TAKE (2) CAPSULES DAILY.  . fluticasone (FLONASE) 50 MCG/ACT nasal spray USE 2 SPRAYS IN EACH NOSTRIL ONCE DAILY.  . folic acid (FOLVITE) 1 MG tablet Take 1 mg by mouth daily.  . Glycopyrrolate-Formoterol (BEVESPI AEROSPHERE) 9-4.8 MCG/ACT AERO Inhale 2 puffs into the lungs 2 (two) times daily.  Marland Kitchen guaiFENesin (MUCINEX) 600 MG 12 hr tablet Take 1,200 mg by mouth 2 (two) times daily.    Marland Kitchen HYDROcodone-acetaminophen (NORCO/VICODIN) 5-325 MG per tablet Take 1 tablet by mouth 3 (three) times daily.  . meclizine (ANTIVERT) 12.5 MG tablet Take 1 tablet (12.5 mg total) by mouth 3 (three) times daily as needed for dizziness.  . Nebulizers (COMPRESSOR NEBULIZER) MISC 1 Device by Does not apply route as needed.  Marland Kitchen omeprazole (PRILOSEC) 40 MG capsule TAKE (1) CAPSULE DAILY  . PROAIR HFA 108 (90 Base) MCG/ACT inhaler 2 PUFFS EVERY 4 HOURS AS NEEDED FOR WHEEZING  . rosuvastatin (CRESTOR) 40 MG tablet TAKE 1/2 TABLET ONCE DAILY  . [DISCONTINUED] alpha-1-proteinase inhibitor, human, (ZEMAIRA) 1000 MG SOLR Inject 60 mg/kg into the  vein once a week.   . [DISCONTINUED] furosemide (LASIX) 20 MG tablet 1 daily for occasional use - diuretic (Patient taking differently: 1 daily for occasional use - diuretic As needed)  . [DISCONTINUED] SPIRIVA HANDIHALER 18 MCG inhalation capsule INHALE 1 CAPSULE DAILY  . [DISCONTINUED] SYMBICORT 80-4.5 MCG/ACT inhaler 2 PUFFS 2 TIMES A DAY   No facility-administered encounter medications on file as of 12/31/2017.       Review of Systems    Constitutional: Negative.   HENT: Negative.   Eyes: Negative.   Respiratory: Negative.   Cardiovascular: Negative.   Gastrointestinal: Negative.   Endocrine: Negative.   Genitourinary: Negative.   Musculoskeletal: Negative.   Skin: Negative.   Allergic/Immunologic: Negative.   Neurological: Negative.   Hematological: Negative.   Psychiatric/Behavioral: Negative.        Objective:   Physical Exam  Constitutional: She is oriented to person, place, and time. She appears well-developed and well-nourished. No distress.  The patient is pleasant and relaxed despite her condition and thankful  HENT:  Head: Normocephalic and atraumatic.  Right Ear: External ear normal.  Left Ear: External ear normal.  Mouth/Throat: Oropharynx is clear and moist.  Slight nasal congestion right greater than left and throat is clear  Eyes: Conjunctivae and EOM are normal. Pupils are equal, round, and reactive to light. Right eye exhibits no discharge. Left eye exhibits no discharge. No scleral icterus.  Patient gets her eye exams regularly  Neck: Normal range of motion. Neck supple. No thyromegaly present.  No bruits thyromegaly or anterior cervical adenopathy  Cardiovascular: Normal rate, regular rhythm, normal heart sounds and intact distal pulses.  No murmur heard. Heart has a regular rate and rhythm at 72/min  Pulmonary/Chest: Effort normal. No respiratory distress. She has no wheezes. She has rales. She exhibits no tenderness.  Diminished breath sounds bilaterally with crackling in both lungs but no worse than in the past.  Abdominal: Soft. Bowel sounds are normal. She exhibits no mass. There is no tenderness. There is no rebound and no guarding.  No liver or spleen enlargement no masses no bruits and no inguinal adenopathy  Musculoskeletal: Normal range of motion. She exhibits no edema.  Lymphadenopathy:    She has no cervical adenopathy.  Neurological: She is alert and oriented to person, place,  and time. She has normal reflexes. No cranial nerve deficit.  Skin: Skin is warm and dry. No rash noted.  Psychiatric: She has a normal mood and affect. Her behavior is normal. Judgment and thought content normal.  Nursing note and vitals reviewed.   BP 124/75 (BP Location: Left Arm)   Pulse 96   Temp (!) 97.3 F (36.3 C) (Oral)   Ht 5' 5.5" (1.664 m)   Wt 193 lb (87.5 kg)   BMI 31.63 kg/m        Assessment & Plan:  1. Pure hypercholesterolemia -Continue with current treatment and aggressive therapeutic lifestyle changes pending results of lab work - CBC with Differential/Platelet - Lipid panel - Hepatic function panel  2. Vitamin D deficiency -Continue with vitamin D replacement pending results of lab work - CBC with Differential/Platelet - VITAMIN D 25 Hydroxy (Vit-D Deficiency, Fractures)  3. Aortic atherosclerosis ([place]) -Recent visit to cardiologist with no additional studies recommended and she will continue with cholesterol management - CBC with Differential/Platelet - BMP8+EGFR - Lipid panel  4. Fibromyalgia -This appears to be stable. - CBC with Differential/Platelet  5. COPD mixed type ([place]) -Continue follow-up with pulmonology and with  current inhalers - CBC with Differential/Platelet  6. 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 Alpha-1-antitrypsin deficiency ([place]) -Follow-up with pulmonology and get IV infusion  8. Pulmonary fibrosis, unspecified ([place]) -Follow-up with pulmonology as planned  9. Obstructive sleep apnea -Continue current treatment  Patient Instructions                       Medicare Annual Wellness Visit  [facility] and the medical providers at [facility] strive to bring you the best medical care.  In doing so we not only want to address your current medical conditions and concerns but also to detect new conditions early and prevent  illness, disease and health-related problems.    Medicare offers a yearly Wellness Visit which allows our clinical staff to assess your need for preventative services including immunizations, lifestyle education, counseling to decrease risk of preventable diseases and screening for fall risk and other medical concerns.    This visit is provided free of charge (no copay) for all Medicare recipients. The clinical pharmacists at [facility] have begun to conduct these Wellness Visits which will also include a thorough review of all your medications.    As you primary medical provider recommend that you make an appointment for your Annual Wellness Visit if you have not done so already this year.  You may set up this appointment before you leave today or you may call back ([phone]) and schedule an appointment.  Please make sure when you call that you mention that you are scheduling your Annual Wellness Visit with the clinical pharmacist so that the appointment may be made for the proper length of time.     Continue current medications. Continue good therapeutic lifestyle changes which include good diet and exercise. Fall precautions discussed with patient. If an FOBT was given today- please return it to our front desk. If you are over 64 years old - you may need Prevnar 69 or the adult Pneumonia vaccine.  **Flu shots are available--- please call and schedule a FLU-CLINIC appointment**  After your visit with [place] today you will receive a survey in the mail or online from [employer] regarding your care with [place]. Please take a moment to fill this out. Your feedback is very important to [place] as you can help [place] better understand your patient needs as well as improve your experience and satisfaction. WE CARE ABOUT YOU!!!  Continue with planned follow-ups with pulmonology Continue to get pelvic exams regularly Continue to avoid environments that are risky respiratory wise. Continue to  drink plenty of fluids and stay well-hydrated Continue with current inhalers as recommended by pulmonology Continue with Mucinex   Arrie Senate MD

## 2017-12-31 NOTE — Telephone Encounter (Signed)
Did not see form in CY's folder or on his cart.  Kalman Shan with Prol[employer] to have forms refaxed.  Will await form.

## 2018-01-01 LAB — CBC WITH DIFFERENTIAL/PLATELET
BASOS ABS: 0 10*3/uL (ref 0.0–0.2)
Basos: 0 %
EOS (ABSOLUTE): 0.2 10*3/uL (ref 0.0–0.4)
EOS: 2 %
HEMATOCRIT: 43.8 % (ref 34.0–46.6)
HEMOGLOBIN: 13.9 g/dL (ref 11.1–15.9)
IMMATURE GRANS (ABS): 0 10*3/uL (ref 0.0–0.1)
Immature Granulocytes: 0 %
LYMPHS ABS: 4.2 10*3/uL — AB (ref 0.7–3.1)
LYMPHS: 42 %
MCH: 28.7 pg (ref 26.6–33.0)
MCHC: 31.7 g/dL (ref 31.5–35.7)
MCV: 91 fL (ref 79–97)
MONOCYTES: 9 %
Monocytes Absolute: 0.9 10*3/uL (ref 0.1–0.9)
Neutrophils Absolute: 4.6 10*3/uL (ref 1.4–7.0)
Neutrophils: 47 %
Platelets: 352 10*3/uL (ref 150–379)
RBC: 4.84 x10E6/uL (ref 3.77–5.28)
RDW: 14.9 % (ref 12.3–15.4)
WBC: 9.9 10*3/uL (ref 3.4–10.8)

## 2018-01-01 LAB — HEPATIC FUNCTION PANEL
ALK PHOS: 147 IU/L — AB (ref 39–117)
ALT: 19 IU/L (ref 0–32)
AST: 35 IU/L (ref 0–40)
Albumin: 4.6 g/dL (ref 3.5–5.5)
Bilirubin Total: 0.2 mg/dL (ref 0.0–1.2)
Bilirubin, Direct: 0.07 mg/dL (ref 0.00–0.40)
Total Protein: 7.8 g/dL (ref 6.0–8.5)

## 2018-01-01 LAB — LIPID PANEL
CHOL/HDL RATIO: 3.6 ratio (ref 0.0–4.4)
CHOLESTEROL TOTAL: 197 mg/dL (ref 100–199)
HDL: 54 mg/dL (ref >39)
LDL CALC: 105 mg/dL — AB (ref 0–99)
TRIGLYCERIDES: 190 mg/dL — AB (ref 0–149)
VLDL CHOLESTEROL CAL: 38 mg/dL (ref 5–40)

## 2018-01-01 LAB — BMP8+EGFR
BUN/Creatinine Ratio: 7 — ABNORMAL LOW (ref 9–23)
BUN: 5 mg/dL — AB (ref 6–24)
CALCIUM: 9.5 mg/dL (ref 8.7–10.2)
CO2: 28 mmol/L (ref 20–29)
CREATININE: 0.74 mg/dL (ref 0.57–1.00)
Chloride: 96 mmol/L (ref 96–106)
GFR calc Af Amer: 103 mL/min/{1.73_m2} (ref >59)
GFR, EST NON AFRICAN AMERICAN: 89 mL/min/{1.73_m2} (ref >59)
GLUCOSE: 122 mg/dL — AB (ref 65–99)
Potassium: 3.7 mmol/L (ref 3.5–5.2)
Sodium: 141 mmol/L (ref 134–144)

## 2018-01-01 LAB — VITAMIN D 25 HYDROXY (VIT D DEFICIENCY, FRACTURES): Vit D, 25-Hydroxy: 57.6 ng/mL (ref 30.0–100.0)

## 2018-01-02 ENCOUNTER — Other Ambulatory Visit: Payer: Self-pay | Admitting: Family Medicine

## 2018-01-03 ENCOUNTER — Inpatient Hospital Stay (HOSPITAL_COMMUNITY): Admission: RE | Admit: 2018-01-03 | Payer: Medicare Other | Source: Ambulatory Visit

## 2018-01-03 NOTE — Telephone Encounter (Signed)
Spoke with [name] regarding forms. [name] states that forms were faxed in and pt is scheduled to start Prolastin this week per Nicole Kindred. Nothing further is needed.

## 2018-01-07 ENCOUNTER — Other Ambulatory Visit (HOSPITAL_COMMUNITY): Payer: Self-pay | Admitting: *Deleted

## 2018-01-08 ENCOUNTER — Ambulatory Visit (HOSPITAL_COMMUNITY)
Admission: RE | Admit: 2018-01-08 | Discharge: 2018-01-08 | Disposition: A | Discharge status: Home / Self Care | Payer: Medicare Other | Source: Ambulatory Visit | Attending: Internal Medicine | Admitting: Internal Medicine

## 2018-01-08 DIAGNOSIS — E8801 Alpha-1-antitrypsin deficiency: Secondary | ICD-10-CM | POA: Insufficient documentation

## 2018-01-08 MED ORDER — ALPHA1-PROTEINASE INHIBITOR 1000 MG IV SOLR
5510.0000 mg | Freq: Once | INTRAVENOUS | Status: AC
  Administered 2018-01-08: 10:00:00 5510 mg via INTRAVENOUS
  Filled 2018-01-08: qty 5510

## 2018-01-08 MED ORDER — ALPHA1-PROTEINASE INHIBITOR 1000 MG IV SOLR
60.0000 mg/kg | INTRAVENOUS | Status: DC
  Filled 2018-01-08 (×2): qty 6000

## 2018-01-17 DIAGNOSIS — H40033 Anatomical narrow angle, bilateral: Secondary | ICD-10-CM | POA: Diagnosis not present

## 2018-01-17 DIAGNOSIS — H04123 Dry eye syndrome of bilateral lacrimal glands: Secondary | ICD-10-CM | POA: Diagnosis not present

## 2018-01-19 ENCOUNTER — Other Ambulatory Visit: Payer: Self-pay | Admitting: Family Medicine

## 2018-01-31 ENCOUNTER — Ambulatory Visit: Payer: Medicare Other | Admitting: *Deleted

## 2018-02-11 DIAGNOSIS — M25512 Pain in left shoulder: Secondary | ICD-10-CM | POA: Diagnosis not present

## 2018-02-11 DIAGNOSIS — M150 Primary generalized (osteo)arthritis: Secondary | ICD-10-CM | POA: Diagnosis not present

## 2018-02-11 DIAGNOSIS — M797 Fibromyalgia: Secondary | ICD-10-CM | POA: Diagnosis not present

## 2018-02-11 DIAGNOSIS — M810 Age-related osteoporosis without current pathological fracture: Secondary | ICD-10-CM | POA: Diagnosis not present

## 2018-02-11 DIAGNOSIS — M5136 Other intervertebral disc degeneration, lumbar region: Secondary | ICD-10-CM | POA: Diagnosis not present

## 2018-02-22 ENCOUNTER — Other Ambulatory Visit: Payer: Self-pay | Admitting: Family Medicine

## 2018-02-27 ENCOUNTER — Ambulatory Visit: Payer: Medicare Other | Admitting: *Deleted

## 2018-03-06 ENCOUNTER — Ambulatory Visit (INDEPENDENT_AMBULATORY_CARE_PROVIDER_SITE_OTHER): Payer: Medicare Other | Admitting: *Deleted

## 2018-03-06 ENCOUNTER — Encounter: Payer: Self-pay | Admitting: *Deleted

## 2018-03-06 VITALS — BP 123/71 | HR 86 | Ht 65.0 in | Wt 190.0 lb

## 2018-03-06 DIAGNOSIS — Z0000 Encounter for general adult medical examination without abnormal findings: Secondary | ICD-10-CM | POA: Diagnosis not present

## 2018-03-06 NOTE — Progress Notes (Signed)
Subjective:   [name] is a 62 y.o. female who presents for a subsequent Medicare Annual Wellness Visit. [name] is married and has 2 adult children. Her daughter lives locally and her son lives in [place]. She has several grandchildren as well.   Review of Systems    Health is about the same as last year.   Cardiac Risk Factors include: advanced age (>34men, >30 women);family history of premature cardiovascular disease;hypertension;dyslipidemia   Resp: COPD due to Alpha 1 Antitrypsin deficiency     Objective:    Today's Vitals   03/06/18 1122 03/06/18 1125  BP: 123/71   Pulse: 86   Weight: 190 lb (86.2 kg)   Height: 5\' 5"  (1.651 m)   PainSc:  6    Body mass index is 31.62 kg/m.  Advanced Directives 03/06/2018 07/09/2015  Does Patient Have a Medical Advance Directive? No No  Would patient like information on creating a medical advance directive? No - Patient declined Yes - Educational materials given    Current Medications (verified) Outpatient Encounter Medications as of 03/06/2018  Medication Sig  . albuterol (PROVENTIL) (2.5 MG/3ML) 0.083% nebulizer solution Take 3 mLs (2.5 mg total) by nebulization every 6 (six) hours as needed.  Marland Kitchen amitriptyline (ELAVIL) 75 MG tablet Take 75 mg by mouth at bedtime.    . chlorthalidone (HYGROTON) 25 MG tablet Take 0.5 tablets (12.5 mg total) by mouth daily.  Marland Kitchen ezetimibe (ZETIA) 10 MG tablet TAKE 1 TABLET ONCE A DAY  . FLUoxetine (PROZAC) 20 MG capsule TAKE (2) CAPSULES DAILY.  . fluticasone (FLONASE) 50 MCG/ACT nasal spray USE 2 SPRAYS IN EACH NOSTRIL ONCE DAILY.  . folic acid (FOLVITE) 1 MG tablet Take 1 mg by mouth daily.  . Glycopyrrolate-Formoterol (BEVESPI AEROSPHERE) 9-4.8 MCG/ACT AERO Inhale 2 puffs into the lungs 2 (two) times daily.  Marland Kitchen guaiFENesin (MUCINEX) 600 MG 12 hr tablet Take 1,200 mg by mouth 2 (two) times daily.    Marland Kitchen HYDROcodone-acetaminophen (NORCO/VICODIN) 5-325 MG per tablet Take 1 tablet by mouth 3  (three) times daily.  . meclizine (ANTIVERT) 12.5 MG tablet Take 1 tablet (12.5 mg total) by mouth 3 (three) times daily as needed for dizziness.  . Nebulizers (COMPRESSOR NEBULIZER) MISC 1 Device by Does not apply route as needed.  Marland Kitchen omeprazole (PRILOSEC) 40 MG capsule TAKE (1) CAPSULE DAILY  . PROAIR HFA 108 (90 Base) MCG/ACT inhaler 2 PUFFS EVERY 4 HOURS AS NEEDED FOR WHEEZING  . rosuvastatin (CRESTOR) 40 MG tablet TAKE 1/2 TABLET ONCE DAILY   No facility-administered encounter medications on file as of 03/06/2018.     Allergies (verified) Penicillins; Actonel [risedronate sodium]; Advair diskus [fluticasone-salmeterol]; Alendronate sodium; Aspirin; Levofloxacin; Morphine; Nsaids; Omnicef [cefdinir]; Erythromycin; and Iodine   History: Past Medical History:  Diagnosis Date  . Alpha-1-antitrypsin deficiency ([place])   . Cervical cancer ([place]) 2004  . COPD mixed type ([place]) 09/14/2007   PFT- 05/06/2013-reduced FVC may reflect effort but the overall pattern is moderate obstructive airways disease with response to bronchodilator, air trapping, normal diffusion. This doesn't fit entirely with emphysema.      . Exposure to hepatitis C 09/12/2016  . Fibromyalgia   . GERD (gastroesophageal reflux disease)   . Hyperlipidemia   . IBS (irritable bowel syndrome)   . Obstructive sleep apnea 06/02/2008   NPSG 06/07/08, AHI 6.9/ hr, weight 220 lbs    . Osteoporosis   . Pulmonary fibrosis, unspecified ([place]) 09/29/2016   CXR 03/28/2016  . Rhinitis, nonallergic 05/08/2012  .  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 Vitamin D deficiency    Past Surgical History:  Procedure Laterality Date  . ABDOMINAL HYSTERECTOMY    . LUMBAR DISC SURGERY     Family History  Problem Relation Age of Onset  . Alpha-1 antitrypsin deficiency Sister   . Osteoporosis Sister   . Pancreatic cancer Mother 102  . Osteoporosis Mother   . Irregular heart beat Mother   . Heart attack Father 38        Died age 61  . Migraines Father   . CAD Sister 3       Died age 44  . Osteoporosis Sister   . Lung cancer Brother   . CAD Brother 42       CABG.  Died from leukemia  . Leukemia Brother   . Heart attack Brother   . Alpha-1 antitrypsin deficiency Other   . Heart disease Brother   . Colon cancer Neg Hx    Social History   Socioeconomic History  . Marital status: Married    Spouse name: Audry Pili  . Number of children: 2  . Years of education: Not on file  . Highest education level: Not on file  Occupational History  . Occupation: disabled    Fish farm manager: UNEMPLOYED  Social Needs  . Financial resource strain: Not hard at all  . Food insecurity:    Worry: Never true    Inability: Never true  . Transportation needs:    Medical: No    Non-medical: No  Tobacco Use  . Smoking status: Former Smoker    Packs/day: 0.50    Types: Cigarettes    Start date: 11/20/1972    Last attempt to quit: 04/01/2012    Years since quitting: 5.9  . Smokeless tobacco: Never Used  Substance and Sexual Activity  . Alcohol use: No  . Drug use: No  . Sexual activity: Yes  Lifestyle  . Physical activity:    Days per week: 5 days    Minutes per session: 30 min  . Stress: Not at all  Relationships  . Social connections:    Talks on phone: More than three times a week    Gets together: More than three times a week    Attends religious service: More than 4 times per year    Active member of club or organization: No    Attends meetings of clubs or organizations: Never    Relationship status: Not on file  Other Topics Concern  . Not on file  Social History Narrative   Lives at home with husband.     Tobacco Counseling No current tobacco use  Clinical Intake:    Pain : 0-10 Pain Score: 6  Pain Type: Chronic pain Pain Location: Generalized(fibromyalgia and multiple joints) Pain Descriptors / Indicators: Aching Pain Onset: More than a month ago Pain Frequency: Constant Effect of Pain  on Daily Activities: Moderate     Nutritional Status: BMI > 30  Obese Diabetes: No  How often do you need to have someone help you when you read instructions, pamphlets, or other written materials from your doctor or pharmacy?: 1 - Never What is the last grade level you completed in school?: 8th grade  Interpreter Needed?: No  Information entered by :: Raynelle Dick, RN   Activities of Daily Living In your present state of health, do you have any difficulty performing the following activities: 03/06/2018  Hearing? N  Vision? N  Comment recent eye exam  Difficulty concentrating or making decisions? N  Walking or climbing stairs? N  Dressing or bathing? N  Doing errands, shopping? N  Preparing Food and eating ? N  Using the Toilet? N  In the past six months, have you accidently leaked urine? Y  Comment some stress incontinence  Do you have problems with loss of bowel control? N  Managing your Medications? N  Managing your Finances? N  Housekeeping or managing your Housekeeping? N  Some recent data might be hidden     Immunizations and Health Maintenance Immunization History  Administered Date(s) Administered  . Influenza Split 09/21/2011  . Influenza Whole 09/15/2010  . Influenza, Quadrivalent, Recombinant, Inj, Pf 09/08/2016  . Influenza,inj,Quad PF,6+ Mos 10/06/2013, 09/27/2015, 08/14/2017  . Influenza,inj,quad, With Preservative 09/28/2014  . Pneumococcal Conjugate-13 12/13/2017  . Tdap 04/05/2017   Health Maintenance Due  Topic Date Due  . PAP SMEAR  06/20/2017    Patient Care Team: [name], MD as PCP - General (Family Medicine) Deneise Lever, MD (Pulmonary Disease) Hennie Duos, MD (Rheumatology) Harlen Labs, MD as Referring Physician (Optometry) Minus Breeding, MD as Consulting Physician (Cardiology) Aloha Gell, MD as Consulting Physician (Obstetrics and Gynecology)   No hospitalizations, ER visits, or surgeries this past year.        Assessment:   This is a routine wellness examination for [place].  Hearing/Vision screen No deficits noted during visit.  Dietary issues and exercise activities discussed:  Diet  Eats oatmeal daily and her cholesteral is down. They grow a lot of their own fruit at home.   Current Exercise Habits: Home exercise routine(stays busy around her home and yard and walks on treadmill), Type of exercise: walking, Time (Minutes): 30, Frequency (Times/Week): 7, Weekly Exercise (Minutes/Week): 210, Intensity: Mild, Exercise limited by: orthopedic condition(s) Stretches daily before getting out of bed  Goals    . Exercise 4 times per week (30 min per time)     Continue to walk - great job!    . Weight < 180 lb (81.647 kg)      Depression Screen PHQ 2/9 Scores 03/06/2018 12/31/2017 08/14/2017 04/05/2017 07/19/2016 06/30/2016 02/10/2016  PHQ - 2 Score 0 0 0 0 0 0 0    Fall Risk Fall Risk  03/06/2018 12/31/2017 08/14/2017 04/05/2017 11/03/2016  Falls in the past year? No No No No No  Cognitive Function: MMSE - Mini Mental State Exam 03/06/2018 07/09/2015  Orientation to time 5 5  Orientation to Place 5 5  Registration 3 3  Attention/ Calculation 5 5  Recall 2 3  Language- name 2 objects 2 2  Language- repeat 1 1  Language- follow 3 step command 3 3  Language- read & follow direction 1 1  Write a sentence 1 1  Copy design 0 1  Total score 28 30        Screening Tests Health Maintenance  Topic Date Due  . PAP SMEAR  06/20/2017  . HIV Screening  12/31/2018 (Originally 05/20/1971)  . MAMMOGRAM  04/18/2018  . INFLUENZA VACCINE  06/20/2018  . DEXA SCAN  08/15/2019  . COLONOSCOPY  03/05/2023  . TETANUS/TDAP  04/06/2027  . Hepatitis C Screening  Completed   Has yearly pap/pelvic exam with Dr Pamala Hurry due to history of cervical cancer 12 years ago. She has had a total hysterectomy.   Plan:  Keep f/u with specialists Keep f/u with PCP Recommend yearly eye exam  Aim  for at least 150  minutes of moderate activity a week Mammogram scheduled  I have personally reviewed and noted the following in the patient's chart:   . Medical and social history . Use of alcohol, tobacco or illicit drugs  . Current medications and supplements . Functional ability and status . Nutritional status . Physical activity . Advanced directives . List of other physicians . Hospitalizations, surgeries, and ER visits in previous 12 months . Vitals . Screenings to include cognitive, depression, and falls . Referrals and appointments  In addition, I have reviewed and discussed with patient certain preventive protocols, quality metrics, and best practice recommendations. A written personalized care plan for preventive services as well as general preventive health recommendations were provided to patient.     Chong Sicilian, RN   03/07/2018

## 2018-03-06 NOTE — Patient Instructions (Signed)
  [name] , Thank you for taking time to come for your Medicare Wellness Visit. I appreciate your ongoing commitment to your health goals. Please review the following plan we discussed and let me know if I can assist you in the future.   These are the goals we discussed: Goals    . Exercise 4 times per week (30 min per time)     Continue to walk - great job!    . Weight < 180 lb (81.647 kg)       This is a list of the screening recommended for you and due dates:  Health Maintenance  Topic Date Due  . Pap Smear  06/20/2017  . HIV Screening  12/31/2018*  . Mammogram  04/18/2018  . Flu Shot  06/20/2018  . DEXA scan (bone density measurement)  08/15/2019  . Colon Cancer Screening  03/05/2023  . Tetanus Vaccine  04/06/2027  .  Hepatitis C: One time screening is recommended by Center for Disease Control  (CDC) for  adults born from [birth year] through 1963.   Completed  *Topic was postponed. The date shown is not the original due date.

## 2018-03-07 ENCOUNTER — Encounter: Payer: Self-pay | Admitting: *Deleted

## 2018-03-20 ENCOUNTER — Telehealth: Payer: Self-pay | Admitting: Internal Medicine

## 2018-03-20 NOTE — Telephone Encounter (Signed)
That would have been required to have on hand because of her home alpha-1 infusions, I guess.  Ok to Rx Epipen, # 1, use as directed, refill prn

## 2018-03-20 NOTE — Telephone Encounter (Addendum)
Spoke with [facility], they have a prescription for a epi pen. I do not see a Rx from [facility] for an epi pen. They wanted to verify the quantity. [facility] please advise if you rx'd an epi pen for pt and if so, please add the quantity.  Current Outpatient Medications on File Prior to Visit  Medication Sig Dispense Refill  . albuterol (PROVENTIL) (2.5 MG/3ML) 0.083% nebulizer solution Take 3 mLs (2.5 mg total) by nebulization every 6 (six) hours as needed. 90 mL 3  . amitriptyline (ELAVIL) 75 MG tablet Take 75 mg by mouth at bedtime.      . chlorthalidone (HYGROTON) 25 MG tablet Take 0.5 tablets (12.5 mg total) by mouth daily. 30 tablet 3  . ezetimibe (ZETIA) 10 MG tablet TAKE 1 TABLET ONCE A DAY 90 tablet 0  . FLUoxetine (PROZAC) 20 MG capsule TAKE (2) CAPSULES DAILY. 180 capsule 0  . fluticasone (FLONASE) 50 MCG/ACT nasal spray USE 2 SPRAYS IN EACH NOSTRIL ONCE DAILY. 16 g 5  . folic acid (FOLVITE) 1 MG tablet Take 1 mg by mouth daily.    . Glycopyrrolate-Formoterol (BEVESPI AEROSPHERE) 9-4.8 MCG/ACT AERO Inhale 2 puffs into the lungs 2 (two) times daily. 1 Inhaler 5  . guaiFENesin (MUCINEX) 600 MG 12 hr tablet Take 1,200 mg by mouth 2 (two) times daily.      Marland Kitchen HYDROcodone-acetaminophen (NORCO/VICODIN) 5-325 MG per tablet Take 1 tablet by mouth 3 (three) times daily.    . meclizine (ANTIVERT) 12.5 MG tablet Take 1 tablet (12.5 mg total) by mouth 3 (three) times daily as needed for dizziness. 30 tablet 1  . Nebulizers (COMPRESSOR NEBULIZER) MISC 1 Device by Does not apply route as needed. 1 each prn  . omeprazole (PRILOSEC) 40 MG capsule TAKE (1) CAPSULE DAILY 90 capsule 0  . PROAIR HFA 108 (90 Base) MCG/ACT inhaler 2 PUFFS EVERY 4 HOURS AS NEEDED FOR WHEEZING 8.5 g 2  . rosuvastatin (CRESTOR) 40 MG tablet TAKE 1/2 TABLET ONCE DAILY 45 tablet 0   No current facility-administered medications on file prior to visit.    Allergies  Allergen Reactions  . Penicillins Nausea And Vomiting  . Actonel [Risedronate  Sodium]     Caused her to retain fluid and stomach problems  . Advair Diskus [Fluticasone-Salmeterol]   . Alendronate Sodium     Stomach upset / esophageal burning  . Aspirin Nausea Only    Stomach burns; abdominal pain  . Levofloxacin     Reports hx of GI distress and GI bleed  . Morphine Itching and Swelling    Throat swells  . Nsaids     Swelling, GI upset   . Omnicef [Cefdinir]   . Erythromycin Rash  . Iodine Rash    Topical and IV

## 2018-03-20 NOTE — Telephone Encounter (Signed)
Spoke with pharmacist at [facility] to clarify epipen rx.  Nothing further needed.

## 2018-04-19 ENCOUNTER — Other Ambulatory Visit: Payer: Self-pay | Admitting: Family Medicine

## 2018-04-19 NOTE — Telephone Encounter (Signed)
OV 05/15/18

## 2018-04-23 ENCOUNTER — Other Ambulatory Visit: Payer: Self-pay | Admitting: Cardiology

## 2018-05-02 ENCOUNTER — Telehealth: Payer: Self-pay | Admitting: Internal Medicine

## 2018-05-02 DIAGNOSIS — J449 Chronic obstructive pulmonary disease, unspecified: Secondary | ICD-10-CM

## 2018-05-02 NOTE — Telephone Encounter (Signed)
Called patient, unable to reach left message to give us a call back. 

## 2018-05-03 NOTE — Telephone Encounter (Signed)
Spoke with pt, she needs nebulizer supplies/kit order sent to [facility]. Advised pt I would send the order. Nothing further is needed.  

## 2018-05-10 ENCOUNTER — Other Ambulatory Visit: Payer: Self-pay

## 2018-05-10 NOTE — Patient Outreach (Signed)
[facility]) Care Management  05/10/2018  OLENA WILLY August 07, 1956 [id number]   Medication Adherence call to Mrs. Doristine Church spoke with patient she is taking 1/2 tablet and has been for the past 2 years she still has medication [place] fill  the prescription for a 45 days supply instead of the 90 days call pharmacy and ask if they can fix the days supply to 90 days other wise patient is going to be showing she is behind on her medication. [place] fix it so it will show she is getting 45 tables for a 90 days supply. Mrs. Despain is showing past due under [place].  [place] Management Direct Dial [phone]  Fax [phone] [email or web address]

## 2018-05-13 DIAGNOSIS — Z1231 Encounter for screening mammogram for malignant neoplasm of breast: Secondary | ICD-10-CM | POA: Diagnosis not present

## 2018-05-13 LAB — HM MAMMOGRAPHY

## 2018-05-14 DIAGNOSIS — M797 Fibromyalgia: Secondary | ICD-10-CM | POA: Diagnosis not present

## 2018-05-14 DIAGNOSIS — M150 Primary generalized (osteo)arthritis: Secondary | ICD-10-CM | POA: Diagnosis not present

## 2018-05-14 DIAGNOSIS — M25512 Pain in left shoulder: Secondary | ICD-10-CM | POA: Diagnosis not present

## 2018-05-14 DIAGNOSIS — M5136 Other intervertebral disc degeneration, lumbar region: Secondary | ICD-10-CM | POA: Diagnosis not present

## 2018-05-14 DIAGNOSIS — M810 Age-related osteoporosis without current pathological fracture: Secondary | ICD-10-CM | POA: Diagnosis not present

## 2018-05-15 ENCOUNTER — Encounter: Payer: Self-pay | Admitting: Family Medicine

## 2018-05-15 ENCOUNTER — Ambulatory Visit (INDEPENDENT_AMBULATORY_CARE_PROVIDER_SITE_OTHER): Payer: Medicare Other | Admitting: Family Medicine

## 2018-05-15 VITALS — BP 126/77 | HR 79 | Temp 97.4°F | Ht 65.0 in | Wt 185.0 lb

## 2018-05-15 DIAGNOSIS — C539 Malignant neoplasm of cervix uteri, unspecified: Secondary | ICD-10-CM

## 2018-05-15 DIAGNOSIS — J42 Unspecified chronic bronchitis: Secondary | ICD-10-CM

## 2018-05-15 DIAGNOSIS — M797 Fibromyalgia: Secondary | ICD-10-CM | POA: Diagnosis not present

## 2018-05-15 DIAGNOSIS — E8801 Alpha-1-antitrypsin deficiency: Secondary | ICD-10-CM | POA: Diagnosis not present

## 2018-05-15 DIAGNOSIS — J449 Chronic obstructive pulmonary disease, unspecified: Secondary | ICD-10-CM

## 2018-05-15 DIAGNOSIS — E7800 Pure hypercholesterolemia, unspecified: Secondary | ICD-10-CM | POA: Diagnosis not present

## 2018-05-15 DIAGNOSIS — I700 Atherosclerosis of aorta: Secondary | ICD-10-CM | POA: Diagnosis not present

## 2018-05-15 DIAGNOSIS — E559 Vitamin D deficiency, unspecified: Secondary | ICD-10-CM

## 2018-05-15 DIAGNOSIS — J8410 Pulmonary fibrosis, unspecified: Secondary | ICD-10-CM | POA: Diagnosis not present

## 2018-05-15 DIAGNOSIS — G4733 Obstructive sleep apnea (adult) (pediatric): Secondary | ICD-10-CM

## 2018-05-15 NOTE — Progress Notes (Signed)
Subjective:    Patient ID: [name], female    DOB: 12-20-1955, 62 y.o.   MRN: [id number]  HPI Pt here for follow up and management of chronic medical problems which includes hyperlipidemia. She is taking medication regularly.  The patient is doing well overall with no specific complaints.  She continues to be followed regularly for her COPD and alpha-1 antitrypsin deficiency issues.  She last saw Dr. Bjorn Loser, the pulmonologist in January of this year.  He apparently prescribed Bevespi.  The patient also has a history of pulmonary fibrosis hyperlipidemia hypertension atherosclerosis of the aorta and sleep apnea.  She has fibromyalgia and osteoarthritis.  She has a remote history of cervical cancer in 2004.  The family history is positive for heart disease and leukemia and migraine.  Patient comes with her husband with her to the visit today.  She is only taking 1 Mucinex daily she still is using her albuterol along with the Bevespi.  She is not doing the Symbicort or the Spiriva now.  On a scale of 1-10 she says her condition is about a 5 and this is been stable.  She sees the pulmonologist again soon.  She denies any chest pain pressure tightness.  She denies any trouble with swallowing heartburn indigestion nausea vomiting diarrhea blood in the stool or black tarry bowel movements.  Her next colonoscopy is not planned until 2024.  She is passing her water without problems and has been to see the ophthalmologist in the past couple months and everything was stable with that.    Patient Active Problem List   Diagnosis Date Noted  . Aortic atherosclerosis ([place]) 05/31/2017  . Essential hypertension 05/31/2017  . Dyslipidemia 05/31/2017  . Pulmonary fibrosis, unspecified ([place]) 09/29/2016  . Exposure to hepatitis C 09/12/2016  . Hyperlipidemia 10/06/2013  . Osteoporosis 06/25/2013  . Rhinitis, nonallergic 05/08/2012  . 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 DYSPNEA 06/24/2008  .  Obstructive sleep apnea 06/02/2008  . Alpha-1-antitrypsin deficiency ([place]) 12/06/2007  . COPD mixed type ([place]) 09/14/2007  . Fibromyalgia 09/14/2007   Outpatient Encounter Medications as of 05/15/2018  Medication Sig  . albuterol (PROVENTIL) (2.5 MG/3ML) 0.083% nebulizer solution Take 3 mLs (2.5 mg total) by nebulization every 6 (six) hours as needed.  Marland Kitchen amitriptyline (ELAVIL) 75 MG tablet Take 75 mg by mouth at bedtime.    . chlorthalidone (HYGROTON) 25 MG tablet TAKE (1/2) TABLET DAILY.  Marland Kitchen ezetimibe (ZETIA) 10 MG tablet TAKE 1 TABLET ONCE A DAY  . FLUoxetine (PROZAC) 20 MG capsule TAKE (2) CAPSULES DAILY.  . fluticasone (FLONASE) 50 MCG/ACT nasal spray USE 2 SPRAYS IN EACH NOSTRIL ONCE DAILY.  . folic acid (FOLVITE) 1 MG tablet Take 1 mg by mouth daily.  . Glycopyrrolate-Formoterol (BEVESPI AEROSPHERE) 9-4.8 MCG/ACT AERO Inhale 2 puffs into the lungs 2 (two) times daily.  Marland Kitchen guaiFENesin (MUCINEX) 600 MG 12 hr tablet Take 1,200 mg by mouth 2 (two) times daily.    Marland Kitchen HYDROcodone-acetaminophen (NORCO/VICODIN) 5-325 MG per tablet Take 1 tablet by mouth 3 (three) times daily.  . meclizine (ANTIVERT) 12.5 MG tablet Take 1 tablet (12.5 mg total) by mouth 3 (three) times daily as needed for dizziness.  . Nebulizers (COMPRESSOR NEBULIZER) MISC 1 Device by Does not apply route as needed.  Marland Kitchen omeprazole (PRILOSEC) 40 MG capsule TAKE (1) CAPSULE DAILY  . PROAIR HFA 108 (90 Base) MCG/ACT inhaler 2 PUFFS EVERY 4 HOURS AS NEEDED FOR WHEEZING  . rosuvastatin (CRESTOR) 40 MG  tablet TAKE 1/2 TABLET ONCE DAILY   No facility-administered encounter medications on file as of 05/15/2018.       Review of Systems  Constitutional: Negative.   HENT: Negative.   Eyes: Negative.   Respiratory: Negative.   Cardiovascular: Negative.   Gastrointestinal: Negative.   Endocrine: Negative.   Genitourinary: Negative.   Musculoskeletal: Negative.   Skin: Negative.   Allergic/Immunologic: Negative.   Neurological:  Negative.   Hematological: Negative.   Psychiatric/Behavioral: Negative.        Objective:   Physical Exam  Constitutional: She is oriented to person, place, and time. She appears well-developed and well-nourished. No distress.  The patient is pleasant and smiling.  Her husband is with her during the visit.  HENT:  Head: Normocephalic and atraumatic.  Right Ear: External ear normal.  Left Ear: External ear normal.  Mouth/Throat: Oropharynx is clear and moist. No oropharyngeal exudate.  Minimal nasal congestion right greater than left  Eyes: Pupils are equal, round, and reactive to light. Conjunctivae and EOM are normal. Right eye exhibits no discharge. Left eye exhibits no discharge. No scleral icterus.  Up-to-date on eye exam  Neck: Normal range of motion. Neck supple. No thyromegaly present.  No bruits thyromegaly or anterior cervical adenopathy  Cardiovascular: Normal rate, regular rhythm, normal heart sounds and intact distal pulses.  No murmur heard. Heart is regular at 72/min  Pulmonary/Chest: Effort normal. No respiratory distress. She has wheezes. She has rales.  Basilar congestion bilaterally which is stable for this patient which is stable for this patient  Abdominal: Soft. Bowel sounds are normal. She exhibits no mass. There is no tenderness. There is no guarding.  Musculoskeletal: Normal range of motion. She exhibits no edema.  Lymphadenopathy:    She has no cervical adenopathy.  Neurological: She is alert and oriented to person, place, and time. She has normal reflexes. No cranial nerve deficit.  Skin: Skin is warm and dry. No rash noted.  Psychiatric: She has a normal mood and affect. Her behavior is normal. Judgment and thought content normal.  Patient is pleasant alert  Nursing note and vitals reviewed.   BP 126/77 (BP Location: Left Arm)   Pulse 79   Temp (!) 97.4 F (36.3 C) (Oral)   Ht '5\' 5"'$  (1.651 m)   Wt 185 lb (83.9 kg)   BMI 30.79 kg/m         Assessment & Plan:  1. Pure hypercholesterolemia -Patient should continue with her current treatment and with as aggressive therapeutic lifestyle changes as possible pending results of lab work - BMP8+EGFR - CBC with Differential/Platelet - Lipid panel - Hepatic function panel  2. Vitamin D deficiency -Continue with current treatment pending results of lab work - CBC with Differential/Platelet - VITAMIN D 25 Hydroxy (Vit-D Deficiency, Fractures)  3. Aortic atherosclerosis ([place]) -Continue with Zetia and atorvastatin and with as aggressive therapeutic lifestyle changes as possible pending results of lab work - BMP8+EGFR - CBC with Differential/Platelet - Lipid panel  4. Fibromyalgia -Continue with current treatment and exercise as tolerated - CBC with Differential/Platelet  5. Malignant neoplasm of cervix, unspecified site [facility]) -Continue to get pelvic exams regularly  6. Pulmonary fibrosis, unspecified ([place]) -Follow-up with pulmonology as planned  7. Alpha-1-antitrypsin deficiency [facility]) -Follow-up with pulmonology as planned and continue with current medications including Mucinex albuterol and Bevespi  8. COPD mixed type ([place]) -Continue with current treatment and follow-up  9. Obstructive sleep apnea -No change in treatment  10. Chronic bronchitis, unspecified  chronic bronchitis type ([place]) -Avoid irritating environmental circumstances as much as possible  Patient Instructions  Continue current medications. Continue good therapeutic lifestyle changes which include good diet and exercise. Fall precautions discussed with patient. If an FOBT was given today- please return it to our front desk. If you are over 86 years old - you may need Prevnar 2 or the adult Pneumonia vaccine.  **Flu shots are available--- please call and schedule a FLU-CLINIC appointment**  After your visit with [place] today you will receive a survey in the mail or online from [employer] regarding your  care with [place]. Please take a moment to fill this out. Your feedback is very important to [place] as you can help [place] better understand your patient needs as well as improve your experience and satisfaction. WE CARE ABOUT YOU!!!   Continue to follow-up with the pulmonologist Return the FOBT Drink plenty of fluids and stay well-hydrated the summer Avoid irritating environmental conditions that could aggravate your breathing Continue with Bevespi albuterol Mucinex and Flonase.  Arrie Senate MD

## 2018-05-15 NOTE — Patient Instructions (Addendum)
Continue current medications. Continue good therapeutic lifestyle changes which include good diet and exercise. Fall precautions discussed with patient. If an FOBT was given today- please return it to our front desk. If you are over 60 years old - you may need Prevnar 47 or the adult Pneumonia vaccine.  **Flu shots are available--- please call and schedule a FLU-CLINIC appointment**  After your visit with [place] today you will receive a survey in the mail or online from [employer] regarding your care with [place]. Please take a moment to fill this out. Your feedback is very important to [place] as you can help [place] better understand your patient needs as well as improve your experience and satisfaction. WE CARE ABOUT YOU!!!   Continue to follow-up with the pulmonologist Return the FOBT Drink plenty of fluids and stay well-hydrated the summer Avoid irritating environmental conditions that could aggravate your breathing Continue with Bevespi albuterol Mucinex and Flonase.

## 2018-05-16 LAB — BMP8+EGFR
BUN/Creatinine Ratio: 10 (ref 9–23)
BUN: 7 mg/dL (ref 6–24)
CHLORIDE: 93 mmol/L — AB (ref 96–106)
CO2: 29 mmol/L (ref 20–29)
Calcium: 9.3 mg/dL (ref 8.7–10.2)
Creatinine, Ser: 0.67 mg/dL (ref 0.57–1.00)
GFR calc non Af Amer: 97 mL/min/{1.73_m2} (ref >59)
GFR, EST AFRICAN AMERICAN: 111 mL/min/{1.73_m2} (ref >59)
GLUCOSE: 108 mg/dL — AB (ref 65–99)
POTASSIUM: 4 mmol/L (ref 3.5–5.2)
Sodium: 135 mmol/L (ref 134–144)

## 2018-05-16 LAB — HEPATIC FUNCTION PANEL
ALK PHOS: 132 IU/L — AB (ref 39–117)
ALT: 22 IU/L (ref 0–32)
AST: 38 IU/L (ref 0–40)
Albumin: 4.3 g/dL (ref 3.5–5.5)
BILIRUBIN TOTAL: 0.3 mg/dL (ref 0.0–1.2)
BILIRUBIN, DIRECT: 0.09 mg/dL (ref 0.00–0.40)
Total Protein: 7.8 g/dL (ref 6.0–8.5)

## 2018-05-16 LAB — CBC WITH DIFFERENTIAL/PLATELET
BASOS ABS: 0.1 10*3/uL (ref 0.0–0.2)
Basos: 1 %
EOS (ABSOLUTE): 0.1 10*3/uL (ref 0.0–0.4)
Eos: 1 %
Hematocrit: 42.3 % (ref 34.0–46.6)
Hemoglobin: 13.9 g/dL (ref 11.1–15.9)
Immature Grans (Abs): 0 10*3/uL (ref 0.0–0.1)
Immature Granulocytes: 0 %
LYMPHS ABS: 2.7 10*3/uL (ref 0.7–3.1)
Lymphs: 35 %
MCH: 27.5 pg (ref 26.6–33.0)
MCHC: 32.9 g/dL (ref 31.5–35.7)
MCV: 84 fL (ref 79–97)
MONOCYTES: 9 %
Monocytes Absolute: 0.7 10*3/uL (ref 0.1–0.9)
NEUTROS ABS: 4.2 10*3/uL (ref 1.4–7.0)
Neutrophils: 54 %
PLATELETS: 331 10*3/uL (ref 150–450)
RBC: 5.06 x10E6/uL (ref 3.77–5.28)
RDW: 14.4 % (ref 12.3–15.4)
WBC: 7.7 10*3/uL (ref 3.4–10.8)

## 2018-05-16 LAB — LIPID PANEL
CHOLESTEROL TOTAL: 160 mg/dL (ref 100–199)
Chol/HDL Ratio: 3.1 ratio (ref 0.0–4.4)
HDL: 51 mg/dL (ref >39)
LDL Calculated: 86 mg/dL (ref 0–99)
TRIGLYCERIDES: 117 mg/dL (ref 0–149)
VLDL Cholesterol Cal: 23 mg/dL (ref 5–40)

## 2018-05-16 LAB — VITAMIN D 25 HYDROXY (VIT D DEFICIENCY, FRACTURES): Vit D, 25-Hydroxy: 51.9 ng/mL (ref 30.0–100.0)

## 2018-05-22 ENCOUNTER — Other Ambulatory Visit: Payer: Self-pay | Admitting: Family Medicine

## 2018-05-24 ENCOUNTER — Other Ambulatory Visit: Payer: Self-pay | Admitting: Family Medicine

## 2018-05-31 ENCOUNTER — Telehealth: Payer: Self-pay | Admitting: *Deleted

## 2018-05-31 NOTE — Telephone Encounter (Signed)
Summary of Benefits showed that patient would owe approximately between $240 and $300.  She filled out financial aid forms months ago but did not hear anything. I am going to mail her information on [employer] which we have had success with. She will follow up once she hears from them.

## 2018-06-03 ENCOUNTER — Encounter: Payer: Self-pay | Admitting: Internal Medicine

## 2018-06-03 ENCOUNTER — Ambulatory Visit: Payer: Medicare Other | Admitting: Internal Medicine

## 2018-06-03 DIAGNOSIS — E8801 Alpha-1-antitrypsin deficiency: Secondary | ICD-10-CM | POA: Diagnosis not present

## 2018-06-03 DIAGNOSIS — J8410 Pulmonary fibrosis, unspecified: Secondary | ICD-10-CM

## 2018-06-03 DIAGNOSIS — J449 Chronic obstructive pulmonary disease, unspecified: Secondary | ICD-10-CM | POA: Diagnosis not present

## 2018-06-03 MED ORDER — UMECLIDINIUM-VILANTEROL 62.5-25 MCG/INH IN AEPB: 1.0000 | INHALATION_SPRAY | Freq: Every day | RESPIRATORY_TRACT | 0 refills | Status: DC

## 2018-06-03 MED ORDER — AZITHROMYCIN 250 MG PO TABS: ORAL_TABLET | ORAL | 0 refills | Status: DC

## 2018-06-03 NOTE — Assessment & Plan Note (Signed)
She is satisfied with the change to Prolastin required by her insurance.  She is working with the new homecare company.

## 2018-06-03 NOTE — Patient Instructions (Signed)
Sample Anoro Ellipta inhaler      Inhale 1 puff, once daily. Try this as your maintenance inhaler, instead of Bevespi. See if it lasts ok through the day for you. If you like it, let us know. Otherwise go back to [place].  Script sent for Zpak

## 2018-06-03 NOTE — Progress Notes (Signed)
HPI  62 yoF former smoker on replacement for a1AT deficiency, with severe COPD, complicated by OSA and fibromyalgia. PFT in 2014 showed moderate obstructive airways disease with normal diffusion Zemaira alpha 1 replacement NPSG 2009-Minimal OSA AHI 6.9/hour, not treated PFT 12/13/17-moderately severe obstructive airways disease, airtrapping, diffusion mildly reduced, slight response to dilator.  FVC 1.92/54%, FEV1 1.43/52%, ratio 0.75, FEF 25-75% 1.62/66%, TLC 97%, DLCO 75%. ------------------------------------------------------------------------------------- 12/13/17- 62 year old female former smoker on replacement for off a-1- antitrypsin deficiency with severe COPD, ?ILD, complicated by mild OSA/not treated, fibromyalgia,                           Zemaira alpha 1 replacement-  Being changed Her insurance stopped covering any of the alpha-1 replacement products but advised that we could start new application process/prior auth for Prolastin- now in progress. ----follow up PFT Symbicort 80, Spiriva, pro-air, nebulizer albuterol Some dry cough times 1 month with no wheezing, no fever. CXR 06/12/17- IMPRESSION: 1. No pneumonia or effusion.  Stable scarring at the lung bases. 2. Peribronchial thickening and hyperaeration may indicate emphysema and/or chronic bronchitis. PFT 12/13/17-moderately severe obstructive airways disease, airtrapping, diffusion mildly reduced, slight response to dilator.  FVC 1.92/54%, FEV1 1.43/52%, ratio 0.75, FEF 25-75% 1.62/66%, TLC 97%, DLCO 75%.  06/03/2018-62 year old female former smoker on replacement for off a-1- antitrypsin deficiency with severe COPD, ?ILD, complicated by mild OSA/not treated, fibromyalgia,  Prolastin-alpha-1 replacement Symbicort 80, Spiriva, pro-air, nebulizer albuterol -----Alpha 1 and COPD mixed type: Pt states she is having increased sinus and chest congestion-yellow in color as well as wheezing.   Bevespi,  pro-air, nebulizer albuterol No  problems with change to Prolastin/different administration company. Bevespi does not seem to last 12 hours for her. Acute exacerbation over the past week with sinus drainage, low-grade fever, cough productive yellow, no GI upset or blood.  ROS-see HPI   + = positive Constitutional:   No-   weight loss, night sweats, fevers, chills, fatigue, lassitude. HEENT:   No-  headaches, difficulty swallowing, tooth/dental problems, sore throat,       No-  sneezing, itching, ear ache, nasal congestion, post nasal drip,  CV:  No-   chest pain, orthopnea, PND, swelling in lower extremities, anasarca, dizziness, palpitations Resp: +shortness of breath with exertion or at rest.              +-productive cough,  +non-productive cough,  No- coughing up of blood.             +-change in color of mucus.  No- wheezing.   Skin: No-   rash or lesions. GI:  No-   heartburn, indigestion, abdominal pain, nausea, vomiting, GU:  MS:  No-   joint pain or swelling.   Neuro-     nothing unusual Psych:  No- change in mood or affect. No depression or anxiety.  No memory loss.   Objective:   Physical Exam    Stable baseline exam General- Alert, Oriented, Affect-appropriate, Distress- none acute, + weight obese Skin- rash-none, lesions- none, excoriation- none Lymphadenopathy- none Head- atraumatic            Eyes- Gross vision intact, PERRLA, conjunctivae clear secretions            Ears- Hearing, canals-normal            Nose- congested, no-Septal dev, mucus, polyps, erosion, perforation             Throat- Mallampati II ,  mucosa clear , drainage- none,                              tonsils- atrophic, +dentures Neck- flexible , trachea midline, no stridor , thyroid nl, carotid no bruit Chest - symmetrical excursion , unlabored           Heart/CV- RRR , no murmur , no gallop  , no rub, nl s1 s2                           - JVD- none , edema- none, stasis changes- none, varices- none           Lung- cough+ None ,   rhonchi-None, dullness-none, rub- none. Unlabored, + crackles coarse-in bases           Chest wall-  Abd-  Br/ Gen/ Rectal- Not done, not indicated Extrem- cyanosis- none, clubbing, none, atrophy- none, strength- nl Neuro- grossly intact to observation

## 2018-06-03 NOTE — Assessment & Plan Note (Signed)
She has severe COPD.  We are watching over time for pattern of progression if any but hopefully this is simple scarring or NSIP.

## 2018-06-03 NOTE — Assessment & Plan Note (Signed)
Recent exacerbation consistent with infection.  We discussed management and medications. She does not feel Bevespi is lasting 12 hours between doses.  We are going to let her try Anoro for comparison. Plan- Z-Pak, sample Anoro to try instead of Bevespi

## 2018-06-04 ENCOUNTER — Telehealth: Payer: Self-pay | Admitting: Internal Medicine

## 2018-06-04 MED ORDER — DOXYCYCLINE HYCLATE 100 MG PO TABS: ORAL_TABLET | ORAL | 0 refills | Status: DC

## 2018-06-04 NOTE — Telephone Encounter (Signed)
Spoke with CY-have patient stay off Zpak-wait another day and then start Doxycycline 100mg  #8 take 2 on day 1 then 1 daily there after until gone. No refills.

## 2018-06-04 NOTE — Telephone Encounter (Signed)
Called and spoke with pt regarding OV with CY yesterday Pt states the Azithromycin is making her throw up and requesting another antibiotic.  Pt advised that she had food with the first dose yesterday, and threw up all night. Pt advised she has not taken any azithromycin today and felt a little better.  CY please advise.  Allergies  Allergen Reactions  . Penicillins Nausea And Vomiting  . Actonel [Risedronate Sodium]     Caused her to retain fluid and stomach problems  . Advair Diskus [Fluticasone-Salmeterol]   . Alendronate Sodium     Stomach upset / esophageal burning  . Aspirin Nausea Only    Stomach burns; abdominal pain  . Levofloxacin     Reports hx of GI distress and GI bleed  . Morphine Itching and Swelling    Throat swells  . Nsaids     Swelling, GI upset   . Omnicef [Cefdinir]   . Erythromycin Rash  . Iodine Rash    Topical and IV   Current Outpatient Medications on File Prior to Visit  Medication Sig Dispense Refill  . albuterol (PROVENTIL) (2.5 MG/3ML) 0.083% nebulizer solution Take 3 mLs (2.5 mg total) by nebulization every 6 (six) hours as needed. 90 mL 3  . amitriptyline (ELAVIL) 75 MG tablet Take 75 mg by mouth at bedtime.      Marland Kitchen azithromycin (ZITHROMAX) 250 MG tablet 2 today then one daily 6 tablet 0  . chlorthalidone (HYGROTON) 25 MG tablet TAKE (1/2) TABLET DAILY. 30 tablet 5  . ezetimibe (ZETIA) 10 MG tablet TAKE 1 TABLET ONCE A DAY 90 tablet 1  . FLUoxetine (PROZAC) 20 MG capsule TAKE (2) CAPSULES DAILY. 180 capsule 0  . fluticasone (FLONASE) 50 MCG/ACT nasal spray USE 2 SPRAYS IN EACH NOSTRIL ONCE DAILY. 16 g 5  . folic acid (FOLVITE) 1 MG tablet Take 1 mg by mouth daily.    . Glycopyrrolate-Formoterol (BEVESPI AEROSPHERE) 9-4.8 MCG/ACT AERO Inhale 2 puffs into the lungs 2 (two) times daily. 1 Inhaler 5  . guaiFENesin (MUCINEX) 600 MG 12 hr tablet Take 1,200 mg by mouth 2 (two) times daily.      Marland Kitchen HYDROcodone-acetaminophen (NORCO/VICODIN) 5-325 MG per  tablet Take 1 tablet by mouth 3 (three) times daily.    . meclizine (ANTIVERT) 12.5 MG tablet Take 1 tablet (12.5 mg total) by mouth 3 (three) times daily as needed for dizziness. 30 tablet 1  . Nebulizers (COMPRESSOR NEBULIZER) MISC 1 Device by Does not apply route as needed. 1 each prn  . omeprazole (PRILOSEC) 40 MG capsule TAKE (1) CAPSULE DAILY 90 capsule 1  . PROAIR HFA 108 (90 Base) MCG/ACT inhaler 2 PUFFS EVERY 4 HOURS AS NEEDED FOR WHEEZING 8.5 g 2  . rosuvastatin (CRESTOR) 40 MG tablet TAKE 1/2 TABLET ONCE DAILY 45 tablet 1  . umeclidinium-vilanterol (ANORO ELLIPTA) 62.5-25 MCG/INH AEPB Inhale 1 puff into the lungs daily. 1 each 0   No current facility-administered medications on file prior to visit.

## 2018-06-12 ENCOUNTER — Telehealth: Payer: Self-pay | Admitting: Family Medicine

## 2018-06-12 NOTE — Telephone Encounter (Signed)
Aware, she should call Dr. Annamaria Boots for advice concerning her stomach . She admits provider gave her a different antibiotic but she has not started it.

## 2018-06-12 NOTE — Telephone Encounter (Signed)
Pt is wanting to speak to [name] about the antibiotic that Dr Annamaria Boots gave her week before last, she did have an reaction to it and ever since then her stomach has not felt right and she is wanting to know what she should do

## 2018-06-13 ENCOUNTER — Ambulatory Visit: Payer: Medicare Other | Admitting: Internal Medicine

## 2018-06-17 ENCOUNTER — Telehealth: Payer: Self-pay | Admitting: Internal Medicine

## 2018-06-17 NOTE — Telephone Encounter (Signed)
This would not be a lingering allergic reaction. There may be something else going on, including possible C.diff infection ( but this usually causes diarrhea). If at all possible, she should try to see her primary care doctor in the next day or 2 to see what is going on.

## 2018-06-17 NOTE — Telephone Encounter (Signed)
Spoke with patient. She is aware of CY's recommendations. Nothing further needed at time of call.  

## 2018-06-17 NOTE — Telephone Encounter (Signed)
Spoke with patient. She stated that she is concerned about possibly still having an reaction to the zpak she was prescribed 2 weeks ago. She took her last dose on 06/03/18. Since then, she has been nauseated and having stomach pains. She denies any SOB, cough or wheezing. Denies any diarrhea or constipation.   She wants to know if there is anything else she should do to help flush this medication out of her system.   Pharmacy is [facility].   CY, please advise. Thanks!

## 2018-06-18 ENCOUNTER — Ambulatory Visit (INDEPENDENT_AMBULATORY_CARE_PROVIDER_SITE_OTHER): Payer: Medicare Other | Admitting: Family Medicine

## 2018-06-18 ENCOUNTER — Encounter: Payer: Self-pay | Admitting: Family Medicine

## 2018-06-18 VITALS — BP 127/77 | HR 89 | Temp 97.4°F | Ht 65.0 in | Wt 179.0 lb

## 2018-06-18 DIAGNOSIS — R112 Nausea with vomiting, unspecified: Secondary | ICD-10-CM | POA: Diagnosis not present

## 2018-06-18 MED ORDER — ONDANSETRON 4 MG PO TBDP
4.0000 mg | ORAL_TABLET | Freq: Once | ORAL | Status: AC
  Administered 2018-06-18: 4 mg via ORAL

## 2018-06-18 MED ORDER — ONDANSETRON 4 MG PO TBDP: 4.0000 mg | ORAL_TABLET | Freq: Three times a day (TID) | ORAL | 0 refills | Status: DC | PRN

## 2018-06-18 NOTE — Patient Instructions (Signed)
I think that your symptoms were a result of your recent antibiotic use.  I gave you Zofran here in office and have prescribed you some to have at home.  You may take your next dose around 6:00 with dinner if needed for nausea.  If your symptoms worsen or do not totally resolve within the next week, please return for reevaluation.  We may need to consider imaging versus referral to the gastric specialist.   Nausea and Vomiting, Adult Feeling sick to your stomach (nausea) means that your stomach is upset or you feel like you have to throw up (vomit). Feeling more and more sick to your stomach can lead to throwing up. Throwing up happens when food and liquid from your stomach are thrown up and out the mouth. Throwing up can make you feel weak and cause you to get dehydrated. Dehydration can make you tired and thirsty, make you have a dry mouth, and make it so you pee (urinate) less often. Older adults and people with other diseases or a weak defense system (immune system) are at higher risk for dehydration. If you feel sick to your stomach or if you throw up, it is important to follow instructions from your doctor about how to take care of yourself. Follow these instructions at home: Eating and drinking Follow these instructions as told by your doctor:  Take an oral rehydration solution (ORS). This is a drink that is sold at pharmacies and stores.  Drink clear fluids in small amounts as you are able, such as: ? Water. ? Ice chips. ? Diluted fruit juice. ? Low-calorie sports drinks.  Eat bland, easy-to-digest foods in small amounts as you are able, such as: ? Bananas. ? Applesauce. ? Rice. ? Low-fat (lean) meats. ? Toast. ? Crackers.  Avoid fluids that have a lot of sugar or caffeine in them.  Avoid alcohol.  Avoid spicy or fatty foods.  General instructions  Drink enough fluid to keep your pee (urine) clear or pale yellow.  Wash your hands often. If you cannot use soap and water, use  hand sanitizer.  Make sure that all people in your home wash their hands well and often.  Take over-the-counter and prescription medicines only as told by your doctor.  Rest at home while you get better.  Watch your condition for any changes.  Breathe slowly and deeply when you feel sick to your stomach.  Keep all follow-up visits as told by your doctor. This is important. Contact a doctor if:  You have a fever.  You cannot keep fluids down.  Your symptoms get worse.  You have new symptoms.  You feel sick to your stomach for more than two days.  You feel light-headed or dizzy.  You have a headache.  You have muscle cramps. Get help right away if:  You have pain in your chest, neck, arm, or jaw.  You feel very weak or you pass out (faint).  You throw up again and again.  You see blood in your throw-up.  Your throw-up looks like black coffee grounds.  You have bloody or black poop (stools) or poop that look like tar.  You have a very bad headache, a stiff neck, or both.  You have a rash.  You have very bad pain, cramping, or bloating in your belly (abdomen).  You have trouble breathing.  You are breathing very quickly.  Your heart is beating very quickly.  Your skin feels cold and clammy.  You feel confused.  You have pain when you pee.  You have signs of dehydration, such as: ? Dark pee, hardly any pee, or no pee. ? Cracked lips. ? Dry mouth. ? Sunken eyes. ? Sleepiness. ? Weakness. These symptoms may be an emergency. Do not wait to see if the symptoms will go away. Get medical help right away. Call your local emergency services (911 in the [place] Do not drive yourself to the hospital. This information is not intended to replace advice given to you by your health care provider. Make sure you discuss any questions you have with your health care provider. Document Released: 04/24/2008 Document Revised: 05/26/2016 Document Reviewed:  07/13/2015 Elsevier Interactive Patient Education  2018 [employer].

## 2018-06-18 NOTE — Progress Notes (Signed)
Subjective: CC: nausea and vomiting PCP: [name], MD [id number] [name] is a 62 y.o. female presenting to clinic today for:  1. Nausea and vomiting Patient reports onset of nausea and vomiting after being prescribed a Z-Pak by her pulmonologist about 2 weeks ago.  She states that she immediately had to vomit after the first day on antibiotics.  She contacted her pulmonologist and was switched to doxycycline.  She notes that she was able to take 2 tablets but then had persistent nausea.  She discontinued medication about 2 days ago and has been able to keep down chicken and start soup since that time.  She does feel nauseated from when she wakes up to when she goes to bed but has not had any recurrent vomiting.  She describes the vomit as nonbloody and nonbilious.  Denies any associated abdominal pain, diarrhea, constipation, hematochezia, melena, fevers, chills, chest pain.  ROS: Per HPI  Allergies  Allergen Reactions  . Penicillins Nausea And Vomiting  . Actonel [Risedronate Sodium]     Caused her to retain fluid and stomach problems  . Advair Diskus [Fluticasone-Salmeterol]   . Alendronate Sodium     Stomach upset / esophageal burning  . Aspirin Nausea Only    Stomach burns; abdominal pain  . Azithromycin Nausea And Vomiting  . Levofloxacin     Reports hx of GI distress and GI bleed  . Morphine Itching and Swelling    Throat swells  . Nsaids     Swelling, GI upset   . Omnicef [Cefdinir]   . Erythromycin Rash  . Iodine Rash    Topical and IV   Past Medical History:  Diagnosis Date  . Alpha-1-antitrypsin deficiency ([place])   . Cervical cancer ([place]) 2004  . 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 This doesn't fit entirely with emphysema.      . Exposure to hepatitis C 09/12/2016  . Fibromyalgia     . GERD (gastroesophageal reflux disease)   . Hyperlipidemia   . IBS (irritable bowel syndrome)   . Obstructive sleep apnea 06/02/2008   NPSG 06/07/08, AHI 6.9/ hr, weight 220 lbs    . Osteoporosis   . Pulmonary fibrosis, unspecified ([place]) 09/29/2016   CXR 03/28/2016  . Rhinitis, nonallergic 05/08/2012  . 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 Vitamin D deficiency     Current Outpatient Medications:  .  albuterol (PROVENTIL) (2.5 MG/3ML) 0.083% nebulizer solution, Take 3 mLs (2.5 mg total) by nebulization every 6 (six) hours as needed., Disp: 90 mL, Rfl: 3 .  amitriptyline (ELAVIL) 75 MG tablet, Take 75 mg by mouth at bedtime.  , Disp: , Rfl:  .  chlorthalidone (HYGROTON) 25 MG tablet, TAKE (1/2) TABLET DAILY., Disp: 30 tablet, Rfl: 5 .  doxycycline (VIBRA-TABS) 100 MG tablet, Take 2 today, then 1 daily until gone, Disp: 8 tablet, Rfl: 0 .  ezetimibe (ZETIA) 10 MG tablet, TAKE 1 TABLET ONCE A DAY, Disp: 90 tablet, Rfl: 1 .  FLUoxetine (PROZAC) 20 MG capsule, TAKE (2) CAPSULES DAILY., Disp: 180 capsule, Rfl: 0 .  fluticasone (FLONASE) 50 MCG/ACT nasal spray, USE 2 SPRAYS IN EACH NOSTRIL ONCE DAILY., Disp: 16 g, Rfl: 5 .  folic acid (FOLVITE) 1 MG tablet, Take 1 mg by mouth daily., Disp: , Rfl:  .  Glycopyrrolate-Formoterol (BEVESPI AEROSPHERE) 9-4.8 MCG/ACT AERO, Inhale 2 puffs into the lungs 2 (two) times daily., Disp: 1 Inhaler, Rfl: 5 .  guaiFENesin (MUCINEX) 600 MG 12 hr tablet, Take 1,200 mg by mouth 2 (two) times daily.  , Disp: , Rfl:  .  HYDROcodone-acetaminophen (NORCO/VICODIN) 5-325 MG per tablet, Take 1 tablet by mouth 3 (three) times daily., Disp: , Rfl:  .  meclizine (ANTIVERT) 12.5 MG tablet, Take 1 tablet (12.5 mg total) by mouth 3 (three) times daily as needed for dizziness., Disp: 30 tablet, Rfl: 1 .  Nebulizers (COMPRESSOR NEBULIZER) MISC, 1 Device by Does not apply route as needed., Disp: 1 each, Rfl: prn .  omeprazole (PRILOSEC) 40 MG  capsule, TAKE (1) CAPSULE DAILY, Disp: 90 capsule, Rfl: 1 .  PROAIR HFA 108 (90 Base) MCG/ACT inhaler, 2 PUFFS EVERY 4 HOURS AS NEEDED FOR WHEEZING, Disp: 8.5 g, Rfl: 2 .  rosuvastatin (CRESTOR) 40 MG tablet, TAKE 1/2 TABLET ONCE DAILY, Disp: 45 tablet, Rfl: 1 .  umeclidinium-vilanterol (ANORO ELLIPTA) 62.5-25 MCG/INH AEPB, Inhale 1 puff into the lungs daily., Disp: 1 each, Rfl: 0  Current Facility-Administered Medications:  .  ondansetron (ZOFRAN-ODT) disintegrating tablet 4 mg, 4 mg, Oral, Once, [name], DO Social History   Socioeconomic History  . Marital status: Married    Spouse name: Audry Pili  . Number of children: 2  . Years of education: Not on file  . Highest education level: Not on file  Occupational History  . Occupation: disabled    Fish farm manager: UNEMPLOYED  Social Needs  . Financial resource strain: Not hard at all  . Food insecurity:    Worry: Never true    Inability: Never true  . Transportation needs:    Medical: No    Non-medical: No  Tobacco Use  . Smoking status: Former Smoker    Packs/day: 0.50    Types: Cigarettes    Start date: 11/20/1972    Last attempt to quit: 04/01/2012    Years since quitting: 6.2  . Smokeless tobacco: Never Used  Substance and Sexual Activity  . Alcohol use: No  . Drug use: No  . Sexual activity: Yes  Lifestyle  . Physical activity:    Days per week: 5 days    Minutes per session: 30 min  . Stress: Not at all  Relationships  . Social connections:    Talks on phone: More than three times a week    Gets together: More than three times a week    Attends religious service: More than 4 times per year    Active member of club or organization: No    Attends meetings of clubs or organizations: Never    Relationship status: Not on file  . Intimate partner violence:    Fear of current or ex partner: No    Emotionally abused: No    Physically abused: No    Forced sexual activity: No  Other Topics Concern  . Not on file    Social History Narrative   Lives at home with husband.    Family History  Problem Relation Age of Onset  . Alpha-1 antitrypsin deficiency Sister   . Osteoporosis Sister   . Pancreatic cancer Mother 79  . Osteoporosis Mother   . Irregular heart beat Mother   . Heart attack Father 38       Died age 54  . Migraines Father   . CAD Sister 61       Died age 82  .  Osteoporosis Sister   . Lung cancer Brother   . CAD Brother 62       CABG.  Died from leukemia  . Leukemia Brother   . Heart attack Brother   . Alpha-1 antitrypsin deficiency Other   . Heart disease Brother   . Colon cancer Neg Hx     Objective: Office vital signs reviewed. BP 127/77   Pulse 89   Temp (!) 97.4 F (36.3 C) (Oral)   Ht 5\' 5"  (1.651 m)   Wt 179 lb (81.2 kg)   BMI 29.79 kg/m   Physical Examination:  General: Awake, alert, nontoxic, No acute distress HEENT: Normal    Neck: No masses palpated. No lymphadenopathy    Eyes: extraocular membranes intact, sclera white   Throat: moist mucus membranes, no erythema Cardio: regular rate and rhythm, S1S2 heard, no murmurs appreciated Pulm: globally decreased breath sounds. no wheezes, rhonchi or rales; normal work of breathing on room air GI: soft, mild epigastric TTP. Negative murphy's. Negative mcburney's, non-distended, bowel sounds present x4, no hepatomegaly, no splenomegaly, no masses  Assessment/ Plan: 62 y.o. female   1. Non-intractable vomiting with nausea, unspecified vomiting type I suspect this is related to recent use of antibiotics.  Both azithromycin and doxycycline are known for nausea.  Symptoms seem to be getting better since she discontinued medication.  No red flags on abdominal exam.  She does not appear dehydrated.  Vital signs are within normal limits.  She was given a dose of Zofran ODT 4 mg here in office and fluid challenged.  She reported quite a bit of improvement in symptoms after Zofran.  She was able to drink almost a full cup of  water during the visit.  I instructed her to continue omeprazole daily as directed given epigastric tenderness on exam.  She was prescribed Zofran ODT 4 mg to have at home.  Home care instructions reviewed.  Push oral fluids.  If symptoms persist or she develops any other worrisome symptoms or signs, which were discussed during office visit, she is to return for reevaluation.  At that point, low threshold for imaging versus referral to gastroenterology. - ondansetron (ZOFRAN-ODT) disintegrating tablet 4 mg   Meds ordered this encounter  Medications  . ondansetron (ZOFRAN-ODT) disintegrating tablet 4 mg  . ondansetron (ZOFRAN ODT) 4 MG disintegrating tablet    Sig: Take 1 tablet (4 mg total) by mouth every 8 (eight) hours as needed for nausea or vomiting.    Dispense:  20 tablet    Refill:  [place], DO [place] [phone]

## 2018-06-20 ENCOUNTER — Other Ambulatory Visit: Payer: Self-pay | Admitting: Internal Medicine

## 2018-06-25 NOTE — Progress Notes (Signed)
In Care Everywhere  

## 2018-07-05 ENCOUNTER — Ambulatory Visit (INDEPENDENT_AMBULATORY_CARE_PROVIDER_SITE_OTHER): Payer: Medicare Other | Admitting: Family Medicine

## 2018-07-05 ENCOUNTER — Encounter: Payer: Self-pay | Admitting: Family Medicine

## 2018-07-05 ENCOUNTER — Ambulatory Visit: Payer: Medicare Other | Admitting: Family Medicine

## 2018-07-05 VITALS — BP 105/65 | HR 112 | Temp 97.1°F | Ht 65.0 in | Wt 172.0 lb

## 2018-07-05 DIAGNOSIS — R634 Abnormal weight loss: Secondary | ICD-10-CM

## 2018-07-05 DIAGNOSIS — R432 Parageusia: Secondary | ICD-10-CM

## 2018-07-05 NOTE — Patient Instructions (Signed)
Call our office on Monday for your lab results.  I placed a urgent referral to Dr. Blanch Media office to have you evaluated for this unplanned weight loss.

## 2018-07-05 NOTE — Progress Notes (Addendum)
Subjective: CC: Unplanned weight loss PCP: [name], MD [id number] [name] is a 62 y.o. female presenting to clinic today for:  1.  Unplanned weight loss Patient reports she is at unplanned weight loss, she thinks over the last month.  She notes that since treatment with an antibiotic about 3 to 4 weeks ago she has had a change in sensation of taste.  She feels that "everything tastes sweet".  This includes when she eats things salty.  She denies any nausea, vomiting, hematochezia, melena.  No change in stool pattern.  She reports associated fatigue.  No alcohol, drug or current tobacco use.  Denies any abdominal pain.  She has a gastroenterologist, Dr. Henrene Pastor with [facility].  Last colonoscopy was in 2014.  No follow-up needed for 10 years after that exam.   ROS: Per HPI  Allergies  Allergen Reactions  . Penicillins Nausea And Vomiting  . Actonel [Risedronate Sodium]     Caused her to retain fluid and stomach problems  . Advair Diskus [Fluticasone-Salmeterol]   . Alendronate Sodium     Stomach upset / esophageal burning  . Aspirin Nausea Only    Stomach burns; abdominal pain  . Azithromycin Nausea And Vomiting  . Levofloxacin     Reports hx of GI distress and GI bleed  . Morphine Itching and Swelling    Throat swells  . Nsaids     Swelling, GI upset   . Omnicef [Cefdinir]   . Erythromycin Rash  . Iodine Rash    Topical and IV   Past Medical History:  Diagnosis Date  . Alpha-1-antitrypsin deficiency ([place])   . Cervical cancer ([place]) 2004  . 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 This doesn't fit entirely with emphysema.      . Exposure to hepatitis C 09/12/2016  . Fibromyalgia   . GERD (gastroesophageal reflux disease)   . Hyperlipidemia   . IBS (irritable bowel syndrome)   . Obstructive sleep  apnea 06/02/2008   NPSG 06/07/08, AHI 6.9/ hr, weight 220 lbs    . Osteoporosis   . Pulmonary fibrosis, unspecified ([place]) 09/29/2016   CXR 03/28/2016  . Rhinitis, nonallergic 05/08/2012  . 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 Vitamin D deficiency     Current Outpatient Medications:  .  albuterol (PROVENTIL) (2.5 MG/3ML) 0.083% nebulizer solution, Take 3 mLs (2.5 mg total) by nebulization every 6 (six) hours as needed., Disp: 90 mL, Rfl: 3 .  amitriptyline (ELAVIL) 75 MG tablet, Take 75 mg by mouth at bedtime.  , Disp: , Rfl:  .  BEVESPI AEROSPHERE 9-4.8 MCG/ACT AERO, 2 PUFFS 2 TIMES A DAY, Disp: 10.7 g, Rfl: 0 .  chlorthalidone (HYGROTON) 25 MG tablet, TAKE (1/2) TABLET DAILY., Disp: 30 tablet, Rfl: 5 .  doxycycline (VIBRA-TABS) 100 MG tablet, Take 2 today, then 1 daily until gone, Disp: 8 tablet, Rfl: 0 .  ezetimibe (ZETIA) 10 MG tablet, TAKE 1 TABLET ONCE A DAY, Disp: 90 tablet, Rfl: 1 .  FLUoxetine (PROZAC) 20 MG capsule, TAKE (2) CAPSULES DAILY., Disp: 180 capsule, Rfl: 0 .  fluticasone (FLONASE) 50 MCG/ACT nasal spray, USE 2 SPRAYS IN EACH NOSTRIL ONCE DAILY., Disp: 16 g, Rfl: 5 .  folic acid (FOLVITE) 1 MG tablet, Take 1 mg by mouth daily., Disp: , Rfl:  .  guaiFENesin (MUCINEX) 600 MG 12 hr tablet, Take 1,200 mg by mouth 2 (two) times daily.  , Disp: , Rfl:  .  HYDROcodone-acetaminophen (NORCO/VICODIN) 5-325 MG per tablet, Take 1 tablet by mouth 3 (three) times daily., Disp: , Rfl:  .  meclizine (ANTIVERT) 12.5 MG tablet, Take 1 tablet (12.5 mg total) by mouth 3 (three) times daily as needed for dizziness., Disp: 30 tablet, Rfl: 1 .  Nebulizers (COMPRESSOR NEBULIZER) MISC, 1 Device by Does not apply route as needed., Disp: 1 each, Rfl: prn .  omeprazole (PRILOSEC) 40 MG capsule, TAKE (1) CAPSULE DAILY, Disp: 90 capsule, Rfl: 1 .  ondansetron (ZOFRAN ODT) 4 MG disintegrating tablet, Take 1 tablet (4 mg total) by mouth every 8 (eight) hours as needed  for nausea or vomiting., Disp: 20 tablet, Rfl: 0 .  PROAIR HFA 108 (90 Base) MCG/ACT inhaler, 2 PUFFS EVERY 4 HOURS AS NEEDED FOR WHEEZING, Disp: 8.5 g, Rfl: 2 .  rosuvastatin (CRESTOR) 40 MG tablet, TAKE 1/2 TABLET ONCE DAILY, Disp: 45 tablet, Rfl: 1 .  umeclidinium-vilanterol (ANORO ELLIPTA) 62.5-25 MCG/INH AEPB, Inhale 1 puff into the lungs daily., Disp: 1 each, Rfl: 0 Social History   Socioeconomic History  . Marital status: Married    Spouse name: Audry Pili  . Number of children: 2  . Years of education: Not on file  . Highest education level: Not on file  Occupational History  . Occupation: disabled    Fish farm manager: UNEMPLOYED  Social Needs  . Financial resource strain: Not hard at all  . Food insecurity:    Worry: Never true    Inability: Never true  . Transportation needs:    Medical: No    Non-medical: No  Tobacco Use  . Smoking status: Former Smoker    Packs/day: 0.50    Types: Cigarettes    Start date: 11/20/1972    Last attempt to quit: 04/01/2012    Years since quitting: 6.2  . Smokeless tobacco: Never Used  Substance and Sexual Activity  . Alcohol use: No  . Drug use: No  . Sexual activity: Yes  Lifestyle  . Physical activity:    Days per week: 5 days    Minutes per session: 30 min  . Stress: Not at all  Relationships  . Social connections:    Talks on phone: More than three times a week    Gets together: More than three times a week    Attends religious service: More than 4 times per year    Active member of club or organization: No    Attends meetings of clubs or organizations: Never    Relationship status: Not on file  . Intimate partner violence:    Fear of current or ex partner: No    Emotionally abused: No    Physically abused: No    Forced sexual activity: No  Other Topics Concern  . Not on file  Social History Narrative   Lives at home with husband.    Family History  Problem Relation Age of Onset  . Alpha-1 antitrypsin deficiency Sister   .  Osteoporosis Sister   . Pancreatic cancer Mother 19  . Osteoporosis Mother   . Irregular heart beat Mother   . Heart attack Father 53       Died age 62  . Migraines Father   . CAD Sister 56       Died age 32  . Osteoporosis Sister   . Lung cancer Brother   . CAD  Brother 34       CABG.  Died from leukemia  . Leukemia Brother   . Heart attack Brother   . Alpha-1 antitrypsin deficiency Other   . Heart disease Brother   . Colon cancer Neg Hx     Objective: Office vital signs reviewed. BP 105/65   Pulse (!) 112   Temp (!) 97.1 F (36.2 C) (Oral)   Ht 5' 5" (1.651 m)   Wt 172 lb (78 kg)   BMI 28.62 kg/m   Physical Examination:  General: Awake, alert, nontoxic, No acute distress HEENT: Normal    Neck: No masses palpated. No goiter.    Eyes: extraocular membranes intact, sclera white    Throat: moist mucus membranes Cardio: rate slightly elevated. Pulm: normal work of breathing. GI: soft, non-tender, non-distended, bowel sounds present x4, mild hepatomegaly, no splenomegaly, no masses  Assessment/ Plan: 62 y.o. female   1. Altered taste Unsure as to why her taste would have changed.  I did not readily identify any medications on her list that would alter her sense of taste.   - CBC with Differential - CMP14+EGFR - Ambulatory referral to Gastroenterology  2. Abnormal weight loss Given her unplanned weight loss which appears to be about 13 pounds since June, I do think that she would warrant evaluation with gastroenterology.  I reviewed her last colonoscopy which was unremarkable except for a small polyp removed.  She does have mild hepatomegaly on exam today.  I reviewed her laboratory results and appears that she has had elevated alkaline phosphatase for many years.  Unsure if this is related.  We will recheck labs today.  Referral placed to gastroenterology.  Reasons for return and urgent evaluation discussed.  Patient was good understanding will follow-up as needed. -  CBC with Differential - CMP14+EGFR - Ambulatory referral to Gastroenterology   Orders Placed This Encounter  Procedures  . CBC with Differential  . CMP14+EGFR  . Ambulatory referral to Gastroenterology    Referral Priority:   Urgent    Referral Type:   Consultation    Referral Reason:   Specialty Services Required    Number of Visits Requested:   [place], [place] [phone]

## 2018-07-06 ENCOUNTER — Other Ambulatory Visit: Payer: Self-pay

## 2018-07-06 ENCOUNTER — Emergency Department (HOSPITAL_COMMUNITY): Payer: Medicare Other

## 2018-07-06 ENCOUNTER — Inpatient Hospital Stay (HOSPITAL_COMMUNITY)
Admission: EM | Admit: 2018-07-06 | Discharge: 2018-07-10 | DRG: 167 | Disposition: A | Discharge status: Home / Self Care | Payer: Medicare Other | Attending: Family Medicine | Admitting: Family Medicine

## 2018-07-06 ENCOUNTER — Encounter (HOSPITAL_COMMUNITY): Payer: Self-pay | Admitting: Emergency Medicine

## 2018-07-06 DIAGNOSIS — Z801 Family history of malignant neoplasm of trachea, bronchus and lung: Secondary | ICD-10-CM

## 2018-07-06 DIAGNOSIS — I10 Essential (primary) hypertension: Secondary | ICD-10-CM | POA: Diagnosis present

## 2018-07-06 DIAGNOSIS — E876 Hypokalemia: Secondary | ICD-10-CM | POA: Diagnosis present

## 2018-07-06 DIAGNOSIS — E878 Other disorders of electrolyte and fluid balance, not elsewhere classified: Secondary | ICD-10-CM | POA: Diagnosis present

## 2018-07-06 DIAGNOSIS — Z883 Allergy status to other anti-infective agents status: Secondary | ICD-10-CM

## 2018-07-06 DIAGNOSIS — R0989 Other specified symptoms and signs involving the circulatory and respiratory systems: Secondary | ICD-10-CM

## 2018-07-06 DIAGNOSIS — C3432 Malignant neoplasm of lower lobe, left bronchus or lung: Principal | ICD-10-CM | POA: Diagnosis present

## 2018-07-06 DIAGNOSIS — R911 Solitary pulmonary nodule: Secondary | ICD-10-CM | POA: Diagnosis not present

## 2018-07-06 DIAGNOSIS — Z205 Contact with and (suspected) exposure to viral hepatitis: Secondary | ICD-10-CM | POA: Diagnosis present

## 2018-07-06 DIAGNOSIS — R590 Localized enlarged lymph nodes: Secondary | ICD-10-CM | POA: Diagnosis present

## 2018-07-06 DIAGNOSIS — I951 Orthostatic hypotension: Secondary | ICD-10-CM

## 2018-07-06 DIAGNOSIS — J9811 Atelectasis: Secondary | ICD-10-CM | POA: Diagnosis present

## 2018-07-06 DIAGNOSIS — E559 Vitamin D deficiency, unspecified: Secondary | ICD-10-CM | POA: Diagnosis not present

## 2018-07-06 DIAGNOSIS — K589 Irritable bowel syndrome without diarrhea: Secondary | ICD-10-CM | POA: Diagnosis not present

## 2018-07-06 DIAGNOSIS — R112 Nausea with vomiting, unspecified: Secondary | ICD-10-CM | POA: Diagnosis not present

## 2018-07-06 DIAGNOSIS — J8410 Pulmonary fibrosis, unspecified: Secondary | ICD-10-CM | POA: Diagnosis not present

## 2018-07-06 DIAGNOSIS — E871 Hypo-osmolality and hyponatremia: Secondary | ICD-10-CM | POA: Diagnosis present

## 2018-07-06 DIAGNOSIS — Z885 Allergy status to narcotic agent status: Secondary | ICD-10-CM

## 2018-07-06 DIAGNOSIS — Z79899 Other long term (current) drug therapy: Secondary | ICD-10-CM

## 2018-07-06 DIAGNOSIS — Z888 Allergy status to other drugs, medicaments and biological substances status: Secondary | ICD-10-CM | POA: Diagnosis not present

## 2018-07-06 DIAGNOSIS — M797 Fibromyalgia: Secondary | ICD-10-CM | POA: Diagnosis present

## 2018-07-06 DIAGNOSIS — I700 Atherosclerosis of aorta: Secondary | ICD-10-CM | POA: Diagnosis not present

## 2018-07-06 DIAGNOSIS — M810 Age-related osteoporosis without current pathological fracture: Secondary | ICD-10-CM | POA: Diagnosis present

## 2018-07-06 DIAGNOSIS — G4733 Obstructive sleep apnea (adult) (pediatric): Secondary | ICD-10-CM | POA: Diagnosis present

## 2018-07-06 DIAGNOSIS — Z6826 Body mass index (BMI) 26.0-26.9, adult: Secondary | ICD-10-CM

## 2018-07-06 DIAGNOSIS — Z8541 Personal history of malignant neoplasm of cervix uteri: Secondary | ICD-10-CM

## 2018-07-06 DIAGNOSIS — J939 Pneumothorax, unspecified: Secondary | ICD-10-CM

## 2018-07-06 DIAGNOSIS — Z90710 Acquired absence of both cervix and uterus: Secondary | ICD-10-CM

## 2018-07-06 DIAGNOSIS — J310 Chronic rhinitis: Secondary | ICD-10-CM | POA: Diagnosis present

## 2018-07-06 DIAGNOSIS — Z880 Allergy status to penicillin: Secondary | ICD-10-CM

## 2018-07-06 DIAGNOSIS — J449 Chronic obstructive pulmonary disease, unspecified: Secondary | ICD-10-CM | POA: Diagnosis present

## 2018-07-06 DIAGNOSIS — Z8262 Family history of osteoporosis: Secondary | ICD-10-CM

## 2018-07-06 DIAGNOSIS — E8801 Alpha-1-antitrypsin deficiency: Secondary | ICD-10-CM | POA: Diagnosis present

## 2018-07-06 DIAGNOSIS — E440 Moderate protein-calorie malnutrition: Secondary | ICD-10-CM | POA: Diagnosis present

## 2018-07-06 DIAGNOSIS — R1110 Vomiting, unspecified: Secondary | ICD-10-CM | POA: Diagnosis not present

## 2018-07-06 DIAGNOSIS — R918 Other nonspecific abnormal finding of lung field: Secondary | ICD-10-CM | POA: Diagnosis not present

## 2018-07-06 DIAGNOSIS — Z7951 Long term (current) use of inhaled steroids: Secondary | ICD-10-CM

## 2018-07-06 DIAGNOSIS — E222 Syndrome of inappropriate secretion of antidiuretic hormone: Secondary | ICD-10-CM | POA: Diagnosis not present

## 2018-07-06 DIAGNOSIS — E861 Hypovolemia: Secondary | ICD-10-CM | POA: Diagnosis not present

## 2018-07-06 DIAGNOSIS — Z886 Allergy status to analgesic agent status: Secondary | ICD-10-CM

## 2018-07-06 DIAGNOSIS — K219 Gastro-esophageal reflux disease without esophagitis: Secondary | ICD-10-CM | POA: Diagnosis present

## 2018-07-06 DIAGNOSIS — Z87891 Personal history of nicotine dependence: Secondary | ICD-10-CM

## 2018-07-06 DIAGNOSIS — Z8249 Family history of ischemic heart disease and other diseases of the circulatory system: Secondary | ICD-10-CM

## 2018-07-06 DIAGNOSIS — R848 Other abnormal findings in specimens from respiratory organs and thorax: Secondary | ICD-10-CM | POA: Diagnosis not present

## 2018-07-06 DIAGNOSIS — R05 Cough: Secondary | ICD-10-CM | POA: Diagnosis not present

## 2018-07-06 LAB — COMPREHENSIVE METABOLIC PANEL
ALBUMIN: 3.4 g/dL — AB (ref 3.5–5.0)
ALK PHOS: 246 U/L — AB (ref 38–126)
ALT: 41 U/L (ref 0–44)
ANION GAP: 10 (ref 5–15)
AST: 82 U/L — ABNORMAL HIGH (ref 15–41)
BUN: 5 mg/dL — ABNORMAL LOW (ref 6–20)
CALCIUM: 8.6 mg/dL — AB (ref 8.9–10.3)
CHLORIDE: 77 mmol/L — AB (ref 98–111)
CO2: 29 mmol/L (ref 22–32)
Creatinine, Ser: 0.44 mg/dL (ref 0.44–1.00)
GFR calc Af Amer: >60 mL/min (ref >60)
GFR calc non Af Amer: >60 mL/min (ref >60)
GLUCOSE: 112 mg/dL — AB (ref 70–99)
POTASSIUM: 2.4 mmol/L — AB (ref 3.5–5.1)
Sodium: 116 mmol/L — CL (ref 135–145)
Total Bilirubin: 1.1 mg/dL (ref 0.3–1.2)
Total Protein: 7.4 g/dL (ref 6.5–8.1)

## 2018-07-06 LAB — CMP14+EGFR
A/G RATIO: 1.3 (ref 1.2–2.2)
ALBUMIN: 4 g/dL (ref 3.6–4.8)
ALT: 46 IU/L — ABNORMAL HIGH (ref 0–32)
AST: 92 IU/L — ABNORMAL HIGH (ref 0–40)
Alkaline Phosphatase: 312 IU/L — ABNORMAL HIGH (ref 39–117)
BUN/Creatinine Ratio: 8 — ABNORMAL LOW (ref 12–28)
BUN: 4 mg/dL — ABNORMAL LOW (ref 8–27)
Bilirubin Total: 0.6 mg/dL (ref 0.0–1.2)
CALCIUM: 9.2 mg/dL (ref 8.7–10.3)
CO2: 27 mmol/L (ref 20–29)
CREATININE: 0.51 mg/dL — AB (ref 0.57–1.00)
Chloride: 78 mmol/L — ABNORMAL LOW (ref 96–106)
GFR, EST AFRICAN AMERICAN: 121 mL/min/{1.73_m2} (ref >59)
GFR, EST NON AFRICAN AMERICAN: 105 mL/min/{1.73_m2} (ref >59)
GLOBULIN, TOTAL: 3.1 g/dL (ref 1.5–4.5)
Glucose: 103 mg/dL — ABNORMAL HIGH (ref 65–99)
POTASSIUM: 3.7 mmol/L (ref 3.5–5.2)
SODIUM: 124 mmol/L — AB (ref 134–144)
TOTAL PROTEIN: 7.1 g/dL (ref 6.0–8.5)

## 2018-07-06 LAB — URINALYSIS, ROUTINE W REFLEX MICROSCOPIC
BILIRUBIN URINE: NEGATIVE
GLUCOSE, UA: NEGATIVE mg/dL
Hgb urine dipstick: NEGATIVE
KETONES UR: 20 mg/dL — AB
Leukocytes, UA: NEGATIVE
NITRITE: NEGATIVE
PH: 8 (ref 5.0–8.0)
Protein, ur: NEGATIVE mg/dL
Specific Gravity, Urine: 1.006 (ref 1.005–1.030)

## 2018-07-06 LAB — CBC WITH DIFFERENTIAL/PLATELET
BASOS ABS: 0 10*3/uL (ref 0.0–0.1)
BASOS PCT: 0 %
BASOS: 0 %
Basophils Absolute: 0 10*3/uL (ref 0.0–0.2)
EOS (ABSOLUTE): 0 10*3/uL (ref 0.0–0.4)
EOS ABS: 0 10*3/uL (ref 0.0–0.7)
EOS: 0 %
Eosinophils Relative: 0 %
HCT: 39.4 % (ref 36.0–46.0)
Hematocrit: 41.3 % (ref 34.0–46.6)
Hemoglobin: 14.4 g/dL (ref 11.1–15.9)
Hemoglobin: 14.1 g/dL (ref 12.0–15.0)
IMMATURE GRANS (ABS): 0 10*3/uL (ref 0.0–0.1)
IMMATURE GRANULOCYTES: 0 %
Lymphocytes Absolute: 1.9 10*3/uL (ref 0.7–3.1)
Lymphocytes Relative: 22 %
Lymphs Abs: 1.8 10*3/uL (ref 0.7–4.0)
Lymphs: 24 %
MCH: 28.6 pg (ref 26.6–33.0)
MCH: 29.3 pg (ref 26.0–34.0)
MCHC: 34.9 g/dL (ref 31.5–35.7)
MCHC: 35.8 g/dL (ref 30.0–36.0)
MCV: 82 fL (ref 79–97)
MCV: 81.7 fL (ref 78.0–100.0)
MONOS ABS: 0.8 10*3/uL (ref 0.1–0.9)
Monocytes Absolute: 0.9 10*3/uL (ref 0.1–1.0)
Monocytes Relative: 11 %
Monocytes: 10 %
NEUTROS PCT: 66 %
Neutro Abs: 5.6 10*3/uL (ref 1.7–7.7)
Neutrophils Absolute: 5.2 10*3/uL (ref 1.4–7.0)
Neutrophils Relative %: 67 %
Platelets: 438 10*3/uL (ref 150–450)
Platelets: 375 10*3/uL (ref 150–400)
RBC: 5.04 x10E6/uL (ref 3.77–5.28)
RBC: 4.82 MIL/uL (ref 3.87–5.11)
RDW: 16.2 % — AB (ref 12.3–15.4)
RDW: 15.2 % (ref 11.5–15.5)
WBC: 8 10*3/uL (ref 3.4–10.8)
WBC: 8.4 10*3/uL (ref 4.0–10.5)

## 2018-07-06 LAB — ACETAMINOPHEN LEVEL: Acetaminophen (Tylenol), Serum: <10 ug/mL — ABNORMAL LOW (ref 10–30)

## 2018-07-06 LAB — BASIC METABOLIC PANEL
ANION GAP: 10 (ref 5–15)
ANION GAP: 10 (ref 5–15)
BUN: <5 mg/dL — ABNORMAL LOW (ref 6–20)
BUN: 5 mg/dL — ABNORMAL LOW (ref 6–20)
CHLORIDE: 81 mmol/L — AB (ref 98–111)
CO2: 27 mmol/L (ref 22–32)
CO2: 25 mmol/L (ref 22–32)
Calcium: 7.8 mg/dL — ABNORMAL LOW (ref 8.9–10.3)
Calcium: 7.8 mg/dL — ABNORMAL LOW (ref 8.9–10.3)
Chloride: 78 mmol/L — ABNORMAL LOW (ref 98–111)
Creatinine, Ser: 0.37 mg/dL — ABNORMAL LOW (ref 0.44–1.00)
Creatinine, Ser: 0.49 mg/dL (ref 0.44–1.00)
GFR calc Af Amer: >60 mL/min (ref >60)
GFR calc Af Amer: >60 mL/min (ref >60)
GFR calc non Af Amer: >60 mL/min (ref >60)
GLUCOSE: 93 mg/dL (ref 70–99)
Glucose, Bld: 125 mg/dL — ABNORMAL HIGH (ref 70–99)
POTASSIUM: 2.8 mmol/L — AB (ref 3.5–5.1)
POTASSIUM: 3 mmol/L — AB (ref 3.5–5.1)
Sodium: 115 mmol/L — CL (ref 135–145)
Sodium: 116 mmol/L — CL (ref 135–145)

## 2018-07-06 LAB — SODIUM, URINE, RANDOM: Sodium, Ur: 77 mmol/L

## 2018-07-06 LAB — CHLORIDE, URINE, RANDOM: Chloride Urine: 53 mmol/L

## 2018-07-06 LAB — SALICYLATE LEVEL

## 2018-07-06 LAB — MAGNESIUM: Magnesium: 1.8 mg/dL (ref 1.7–2.4)

## 2018-07-06 LAB — TROPONIN I

## 2018-07-06 LAB — OSMOLALITY, URINE: Osmolality, Ur: 288 mOsm/kg — ABNORMAL LOW (ref 300–900)

## 2018-07-06 LAB — LACTIC ACID, PLASMA: LACTIC ACID, VENOUS: 1 mmol/L (ref 0.5–1.9)

## 2018-07-06 LAB — PROTIME-INR
INR: 1.03
PROTHROMBIN TIME: 13.4 s (ref 11.4–15.2)

## 2018-07-06 LAB — LIPASE, BLOOD: LIPASE: 41 U/L (ref 11–51)

## 2018-07-06 MED ORDER — ONDANSETRON HCL 4 MG/2ML IJ SOLN
4.0000 mg | Freq: Four times a day (QID) | INTRAMUSCULAR | Status: DC | PRN
Start: 2018-07-06 — End: 2018-07-10

## 2018-07-06 MED ORDER — POTASSIUM CHLORIDE CRYS ER 20 MEQ PO TBCR
40.0000 meq | EXTENDED_RELEASE_TABLET | Freq: Two times a day (BID) | ORAL | Status: DC
  Administered 2018-07-07 – 2018-07-10 (×7): 40 meq via ORAL
  Filled 2018-07-06 (×7): qty 2

## 2018-07-06 MED ORDER — IOHEXOL 300 MG/ML  SOLN
75.0000 mL | Freq: Once | INTRAMUSCULAR | Status: AC | PRN
  Administered 2018-07-06: 75 mL via INTRAVENOUS

## 2018-07-06 MED ORDER — POTASSIUM CHLORIDE 10 MEQ/100ML IV SOLN
10.0000 meq | INTRAVENOUS | Status: DC
  Filled 2018-07-06: qty 100

## 2018-07-06 MED ORDER — HYDROCODONE-ACETAMINOPHEN 5-325 MG PO TABS
1.0000 | ORAL_TABLET | Freq: Three times a day (TID) | ORAL | Status: DC
  Administered 2018-07-06 – 2018-07-10 (×11): 1 via ORAL
  Filled 2018-07-06 (×11): qty 1

## 2018-07-06 MED ORDER — GUAIFENESIN ER 600 MG PO TB12
1200.0000 mg | ORAL_TABLET | Freq: Two times a day (BID) | ORAL | Status: DC
  Administered 2018-07-06: 1200 mg via ORAL
  Filled 2018-07-06 (×5): qty 2

## 2018-07-06 MED ORDER — AMITRIPTYLINE HCL 25 MG PO TABS
75.0000 mg | ORAL_TABLET | Freq: Every day | ORAL | Status: DC
  Administered 2018-07-06: 75 mg via ORAL
  Filled 2018-07-06 (×3): qty 3

## 2018-07-06 MED ORDER — ACETAMINOPHEN 650 MG RE SUPP: 650.0000 mg | Freq: Four times a day (QID) | RECTAL | Status: DC | PRN

## 2018-07-06 MED ORDER — SODIUM CHLORIDE 0.9 % IV BOLUS
500.0000 mL | Freq: Once | INTRAVENOUS | Status: AC
  Administered 2018-07-06: 500 mL via INTRAVENOUS

## 2018-07-06 MED ORDER — ENOXAPARIN SODIUM 40 MG/0.4ML ~~LOC~~ SOLN
40.0000 mg | SUBCUTANEOUS | Status: DC
  Administered 2018-07-06 – 2018-07-09 (×4): 40 mg via SUBCUTANEOUS
  Filled 2018-07-06 (×4): qty 0.4

## 2018-07-06 MED ORDER — ONDANSETRON HCL 4 MG PO TABS: 4.0000 mg | ORAL_TABLET | Freq: Four times a day (QID) | ORAL | Status: DC | PRN

## 2018-07-06 MED ORDER — ROSUVASTATIN CALCIUM 20 MG PO TABS
20.0000 mg | ORAL_TABLET | Freq: Every day | ORAL | Status: DC
  Administered 2018-07-06 – 2018-07-09 (×4): 20 mg via ORAL
  Filled 2018-07-06 (×6): qty 1

## 2018-07-06 MED ORDER — ACETAMINOPHEN 325 MG PO TABS: 650.0000 mg | ORAL_TABLET | Freq: Four times a day (QID) | ORAL | Status: DC | PRN

## 2018-07-06 MED ORDER — MECLIZINE HCL 12.5 MG PO TABS
12.5000 mg | ORAL_TABLET | Freq: Three times a day (TID) | ORAL | Status: DC | PRN
  Filled 2018-07-06: qty 1

## 2018-07-06 MED ORDER — ALBUTEROL SULFATE HFA 108 (90 BASE) MCG/ACT IN AERS
2.0000 | INHALATION_SPRAY | RESPIRATORY_TRACT | Status: DC | PRN
  Filled 2018-07-06: qty 6.7

## 2018-07-06 MED ORDER — POTASSIUM CHLORIDE 10 MEQ/100ML IV SOLN
10.0000 meq | INTRAVENOUS | Status: AC
  Administered 2018-07-06 (×2): 10 meq via INTRAVENOUS
  Filled 2018-07-06 (×2): qty 100

## 2018-07-06 MED ORDER — POTASSIUM CHLORIDE CRYS ER 20 MEQ PO TBCR: 40.0000 meq | EXTENDED_RELEASE_TABLET | Freq: Two times a day (BID) | ORAL | Status: DC

## 2018-07-06 MED ORDER — TOLVAPTAN 15 MG PO TABS
15.0000 mg | ORAL_TABLET | ORAL | Status: DC
  Administered 2018-07-07: 15 mg via ORAL
  Filled 2018-07-06 (×3): qty 1

## 2018-07-06 MED ORDER — SODIUM CHLORIDE 1 G PO TABS
2.0000 g | ORAL_TABLET | Freq: Three times a day (TID) | ORAL | Status: DC
  Administered 2018-07-06: 2 g via ORAL
  Filled 2018-07-06 (×8): qty 2

## 2018-07-06 MED ORDER — POTASSIUM CHLORIDE CRYS ER 20 MEQ PO TBCR
40.0000 meq | EXTENDED_RELEASE_TABLET | Freq: Once | ORAL | Status: AC
Start: 2018-07-06 — End: 2018-07-06
  Administered 2018-07-06: 40 meq via ORAL
  Filled 2018-07-06: qty 2

## 2018-07-06 MED ORDER — SODIUM CHLORIDE 1 G PO TABS
1.0000 g | ORAL_TABLET | Freq: Three times a day (TID) | ORAL | Status: DC
  Filled 2018-07-06 (×6): qty 1

## 2018-07-06 MED ORDER — FLUOXETINE HCL 20 MG PO CAPS
40.0000 mg | ORAL_CAPSULE | Freq: Every day | ORAL | Status: DC
  Filled 2018-07-06: qty 2

## 2018-07-06 MED ORDER — SODIUM CHLORIDE 0.9 % IV SOLN
INTRAVENOUS | Status: DC
  Administered 2018-07-06: 15:00:00 via INTRAVENOUS

## 2018-07-06 MED ORDER — POTASSIUM CHLORIDE 10 MEQ/100ML IV SOLN
10.0000 meq | INTRAVENOUS | Status: AC
  Administered 2018-07-06 (×3): 10 meq via INTRAVENOUS
  Filled 2018-07-06 (×2): qty 100

## 2018-07-06 MED ORDER — UMECLIDINIUM-VILANTEROL 62.5-25 MCG/INH IN AEPB
1.0000 | INHALATION_SPRAY | Freq: Every day | RESPIRATORY_TRACT | Status: DC
  Filled 2018-07-06: qty 14

## 2018-07-06 MED ORDER — MAGNESIUM SULFATE 2 GM/50ML IV SOLN
2.0000 g | Freq: Once | INTRAVENOUS | Status: AC
  Administered 2018-07-06: 2 g via INTRAVENOUS
  Filled 2018-07-06: qty 50

## 2018-07-06 MED ORDER — PANTOPRAZOLE SODIUM 40 MG PO TBEC
40.0000 mg | DELAYED_RELEASE_TABLET | Freq: Every day | ORAL | Status: DC
  Administered 2018-07-06 – 2018-07-10 (×5): 40 mg via ORAL
  Filled 2018-07-06 (×5): qty 1

## 2018-07-06 MED ORDER — EZETIMIBE 10 MG PO TABS
10.0000 mg | ORAL_TABLET | Freq: Every day | ORAL | Status: DC
  Administered 2018-07-07 – 2018-07-09 (×3): 10 mg via ORAL
  Filled 2018-07-06 (×5): qty 1

## 2018-07-06 NOTE — ED Triage Notes (Signed)
Patient sent to [facility] from [facility] for elevated AST and ALT. Per patient went to PCP yesterday for concerns from allergic reaction she had to medication from 5 weeks ago. Denies any improvement in symptoms (Loss of appetite, food taste sweet, extreme weight lose).

## 2018-07-06 NOTE — ED Notes (Signed)
CRITICAL VALUE ALERT  Critical Value:  Sodium 116, Potassium 2.4  Date & Time Notied:  1358, 07/06/18  Provider Notified: Dr. Thurnell Garbe  Orders Received/Actions taken: no orders entered by physcian

## 2018-07-06 NOTE — Progress Notes (Signed)
Patient Name: [name], female   DOB: 06-22-1956, 62 y.o.  MRN: [id number]   Patient and family asked to see me. Wants to go to [facility] hospital - were going to leave and drive there. Will admit pt to [facility] stepdown.  Truett Mainland, DO 07/06/2018 10:38 PM

## 2018-07-06 NOTE — ED Notes (Signed)
AC called to bring po samca to ED for administration.

## 2018-07-06 NOTE — ED Notes (Signed)
Pt states she has taken her eztimibe (Zetia) 10mg  tablet tonight for her daily dose.  Pt states she only takes this medication at bedtime

## 2018-07-06 NOTE — ED Notes (Signed)
Hospitalist at bedside with pt and daughter at this time.

## 2018-07-06 NOTE — Progress Notes (Signed)
Patient Name: [name], female   DOB: 29-Oct-1956, 62 y.o.  MRN: [id number]  Spoke with radiology regarding CT chest   IMPRESSION: Predominantly left-sided mediastinal, hilar and subcarinal lymphadenopathy. A 6 cm left hilar/subhilar mass may represent a primary malignancy versus a conglomerate of abnormal lymph nodes. It causes compression of the left lower lobe pulmonary arterial branches, left lower lobe main bronchus and the lower esophagus.  Post compressive complete collapse of the left lower lobe.  Multiple areas of ground-glass opacity in bilateral lungs. The index lesion in the periphery of the right upper lobe measures 4 cm. These may represent infectious or inflammatory consolidation, however bronchoalveolar carcinoma cannot be excluded.  Diffusely heterogeneous nodular appearance of the liver. This may represent changes of cirrhosis or alternatively diffuse hepatic metastatic disease.  Moderate lymphadenopathy in the upper abdomen.  Bronchoscopy with tissue sampling may be considered to arrive to a histologic diagnosis.   Last BMP:  Lab Results  Component Value Date   CREATININE 0.37 (L) 07/06/2018   BUN <5 (L) 07/06/2018   NA 115 (LL) 07/06/2018   K 2.8 (L) 07/06/2018   CL 78 (L) 07/06/2018   CO2 27 07/06/2018   Sodium worsened. 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   I discussed results with patient. Will consult pulmonology in AM - possible bronchoscopy with improvement of sodium.  D/c IVF. Fluid restrict. Start tolvaptan.  Truett Mainland, DO 07/06/2018 6:57 PM

## 2018-07-06 NOTE — Progress Notes (Addendum)
Patient family does not wish mdi to be substituted because of bad reactions to RX. Husband is going home to get med. She does not take anoro Ellipta this was given as sample. She takes [employer] which I doubt we have in formulary. This rx would most likely be substituted. Husband and family daughter are upset and he is going home to get her med.as its 2 hours past her regular time. 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 Also of note she is allergic to Advair by records. Baird Lyons is her Pulmonary MD, she also has alpha 1 trypsin defficicency.

## 2018-07-06 NOTE — ED Provider Notes (Signed)
[facility] EMERGENCY DEPARTMENT Provider Note   CSN: [id number] Arrival date & time: 07/06/18  1221     History   Chief Complaint Chief Complaint  Patient presents with  . Abnormal Lab    HPI [name] is a 62 y.o. female.   Abnormal Lab   Pt was seen at 1250. Per pt, c/o gradual onset and persistence of multiple intermittent episodes of N/V that began last month.   Has been associated with generalized fatigue, weight loss, and loss of appetite. Pt states she was evaluated by her PMD yesterday, had labs drawn, and was called today and told her "LFT's were elevated and sodium was low."  Denies abd pain, no diarrhea, no CP/SOB, no back pain, no fevers, no black or blood in stools or emesis, no focal motor weakness, no tingling/numbness in extremities.    Past Medical History:  Diagnosis Date  . Alpha-1-antitrypsin deficiency ([place])   . Cervical cancer ([place]) 2004  . 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 This doesn't fit entirely with emphysema.      . Exposure to hepatitis C 09/12/2016  . Fibromyalgia   . GERD (gastroesophageal reflux disease)   . Hyperlipidemia   . IBS (irritable bowel syndrome)   . Obstructive sleep apnea 06/02/2008   NPSG 06/07/08, AHI 6.9/ hr, weight 220 lbs    . Osteoporosis   . Pulmonary fibrosis, unspecified ([place]) 09/29/2016   CXR 03/28/2016  . Rhinitis, nonallergic 05/08/2012  . 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 Vitamin D deficiency     Patient Active Problem List   Diagnosis Date Noted  . Aortic atherosclerosis ([place]) 05/31/2017  . Essential hypertension 05/31/2017  . Dyslipidemia 05/31/2017  . Pulmonary fibrosis, unspecified ([place]) 09/29/2016  . Exposure to hepatitis C 09/12/2016  . Hyperlipidemia 10/06/2013  . Osteoporosis  06/25/2013  . Rhinitis, nonallergic 05/08/2012  . 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 DYSPNEA 06/24/2008  . Obstructive sleep apnea 06/02/2008  . Alpha-1-antitrypsin deficiency ([place]) 12/06/2007  . 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 Fibromyalgia 09/14/2007    Past Surgical History:  Procedure Laterality Date  . ABDOMINAL HYSTERECTOMY    . LUMBAR DISC SURGERY       OB History   None      Home Medications    Prior to Admission medications   Medication Sig Start Date End Date Taking? Authorizing Provider  albuterol (PROVENTIL) (2.5 MG/3ML) 0.083% nebulizer solution Take 3 mLs (2.5 mg total) by nebulization every 6 (six) hours as needed. 06/07/17   [name], MD  amitriptyline (ELAVIL) 75 MG tablet Take 75 mg by mouth at bedtime.      [provider]  BEVESPI AEROSPHERE 9-4.8 MCG/ACT AERO 2 PUFFS 2 TIMES A DAY 06/20/18   Young, Tarri Fuller D, MD  chlorthalidone (HYGROTON) 25 MG tablet TAKE (1/2) TABLET DAILY. 04/23/18   Minus Breeding, MD  doxycycline (VIBRA-TABS) 100 MG tablet Take 2 today, then 1 daily until gone 06/04/18   Baird Lyons D, MD  ezetimibe (ZETIA) 10 MG tablet TAKE 1 TABLET ONCE A DAY 05/24/18   [name], MD  FLUoxetine (PROZAC) 20 MG capsule TAKE (2) CAPSULES DAILY. 04/19/18   [name], MD  fluticasone (FLONASE) 50 MCG/ACT nasal spray USE 2 SPRAYS IN EACH NOSTRIL ONCE DAILY.  01/02/18   [name], MD  folic acid (FOLVITE) 1 MG tablet Take 1 mg by mouth daily.    [provider]  guaiFENesin (MUCINEX) 600 MG 12 hr tablet Take 1,200 mg by mouth 2 (two) times daily.      [provider]  HYDROcodone-acetaminophen (NORCO/VICODIN) 5-325 MG per tablet Take 1 tablet by mouth 3 (three) times daily.    [provider]  meclizine (ANTIVERT) 12.5 MG tablet Take 1 tablet (12.5 mg total) by mouth 3 (three) times daily as needed for dizziness. 07/19/16   [name], MD  Nebulizers (COMPRESSOR NEBULIZER) MISC 1 Device by  Does not apply route as needed. 01/01/15   Baird Lyons D, MD  omeprazole (PRILOSEC) 40 MG capsule TAKE (1) CAPSULE DAILY 05/24/18   [name], MD  ondansetron (ZOFRAN ODT) 4 MG disintegrating tablet Take 1 tablet (4 mg total) by mouth every 8 (eight) hours as needed for nausea or vomiting. 06/18/18   [name], DO  PROAIR HFA 108 [phone] Base) MCG/ACT inhaler 2 PUFFS EVERY 4 HOURS AS NEEDED FOR WHEEZING 02/21/16   [name], MD  rosuvastatin (CRESTOR) 40 MG tablet TAKE 1/2 TABLET ONCE DAILY 05/22/18   [name], MD  umeclidinium-vilanterol (ANORO ELLIPTA) 62.5-25 MCG/INH AEPB Inhale 1 puff into the lungs daily. 06/03/18   Deneise Lever, MD    Family History Family History  Problem Relation Age of Onset  . Alpha-1 antitrypsin deficiency Sister   . Osteoporosis Sister   . Pancreatic cancer Mother 41  . Osteoporosis Mother   . Irregular heart beat Mother   . Heart attack Father 63       Died age 32  . Migraines Father   . CAD Sister 12       Died age 65  . Osteoporosis Sister   . Lung cancer Brother   . CAD Brother 21       CABG.  Died from leukemia  . Leukemia Brother   . Heart attack Brother   . Alpha-1 antitrypsin deficiency Other   . Heart disease Brother   . Colon cancer Neg Hx     Social History Social History   Tobacco Use  . Smoking status: Former Smoker    Packs/day: 0.50    Types: Cigarettes    Start date: 11/20/1972    Last attempt to quit: 04/01/2012    Years since quitting: 6.2  . Smokeless tobacco: Never Used  Substance Use Topics  . Alcohol use: No  . Drug use: No     Allergies   Penicillins; Actonel [risedronate sodium]; Advair diskus [fluticasone-salmeterol]; Alendronate sodium; Aspirin; Azithromycin; Levofloxacin; Morphine; Nsaids; Omnicef [cefdinir]; Erythromycin; and Iodine   Review of Systems Review of Systems ROS: Statement: All systems negative except as marked or noted in the HPI; Constitutional: Negative for fever and  chills. +generalized weakness/fatigue.; ; Eyes: Negative for eye pain, redness and discharge. ; ; ENMT: Negative for ear pain, hoarseness, nasal congestion, sinus pressure and sore throat. ; ; Cardiovascular: Negative for chest pain, palpitations, diaphoresis, dyspnea and peripheral edema. ; ; Respiratory: Negative for cough, wheezing and stridor. ; ; Gastrointestinal: +N/V, decreased appetite, weight loss. Negative for diarrhea, abdominal pain, blood in stool, hematemesis, jaundice and rectal bleeding. . ; ; Genitourinary: Negative for dysuria, flank pain and hematuria. ; ; Musculoskeletal: Negative for back pain and neck pain. Negative for swelling and trauma.; ; Skin: Negative for pruritus, rash, abrasions, blisters, bruising and skin  lesion.; ; Neuro: Negative for headache, lightheadedness and neck stiffness. Negative for altered level of consciousness, altered mental status, extremity weakness, paresthesias, involuntary movement, seizure and syncope.       Physical Exam Updated Vital Signs BP 105/71   Pulse 92   Temp 97.8 F (36.6 C)   Resp 18   Ht 5\' 5"  (1.651 m)   Wt 78 kg   SpO2 95%   BMI 28.62 kg/m     Patient Vitals for the past 24 hrs:  BP Temp Pulse Resp SpO2 Height Weight  07/06/18 1500 128/71 - 87 15 92 % - -  07/06/18 1445 136/73 - 89 20 92 % - -  07/06/18 1400 115/85 - 91 19 90 % - -  07/06/18 1330 118/68 - 86 18 91 % - -  07/06/18 1240 - - - - - 5\' 5"  (1.651 m) 78 kg  07/06/18 1239 105/71 97.8 F (36.6 C) 92 18 95 % - -     13:40 Orthostatic Vital Signs LA  Orthostatic Lying   BP- Lying: 115/64   Pulse- Lying: 86       Orthostatic Sitting  BP- Sitting: 118/68   Pulse- Sitting: 91       Orthostatic Standing at 0 minutes  BP- Standing at 0 minutes: 88/53Abnormal    Pulse- Standing at 0 minutes: 96      Physical Exam 1255: Physical examination:  Nursing notes reviewed; Vital signs and O2 SAT reviewed;  Constitutional: Well developed, Well nourished, Well  hydrated, In no acute distress; Head:  Normocephalic, atraumatic; Eyes: EOMI, PERRL, No scleral icterus; ENMT: Mouth and pharynx normal, Mucous membranes moist; Neck: Supple, Full range of motion, No lymphadenopathy; Cardiovascular: Regular rate and rhythm, No gallop; Respiratory: Breath sounds clear & equal bilaterally, No wheezes.  Speaking full sentences with ease, Normal respiratory effort/excursion; Chest: Nontender, Movement normal; Abdomen: Soft, Nontender, Nondistended, Normal bowel sounds; Genitourinary: No CVA tenderness; Extremities: Peripheral pulses normal, No tenderness, No edema, No calf edema or asymmetry.; Neuro: AA&Ox3, Major CN grossly intact.  Speech clear. No gross focal motor or sensory deficits in extremities.; Skin: Color normal, Warm, Dry.   ED Treatments / Results  Labs (all labs ordered are listed, but only abnormal results are displayed)   EKG EKG Interpretation  Date/Time:  Saturday July 06 2018 13:35:32 EDT Ventricular Rate:  88 PR Interval:    QRS Duration: 180 QT Interval:  445 QTC Calculation: 539 R Axis:   81 Text Interpretation:  Sinus rhythm Right bundle branch block When compared with ECG of 05/30/2017 Right bundle branch block is now Present Reconfirmed by Francine Graven [phone]) on 07/06/2018 1:45:15 PM   Radiology   Procedures Procedures (including critical care time)  Medications Ordered in ED Medications - No data to display   Initial Impression / Assessment and Plan / ED Course  I have reviewed the triage vital signs and the nursing notes.  Pertinent labs & imaging results that were available during my care of the patient were reviewed by me and considered in my medical decision making (see chart for details).  MDM Reviewed:  previous chart, nursing note and vitals Reviewed previous: labs and ECG Interpretation: labs, ECG and x-ray Total time providing critical care: 30-74 minutes. This excludes time spent performing separately  reportable procedures and services. Consults: admitting MD   CRITICAL CARE Performed by: Francine Graven Total critical care time: 35 minutes Critical care time was exclusive of separately billable procedures and treating other patients. Critical  care was necessary to treat or prevent imminent or life-threatening deterioration. Critical care was time spent personally by me on the following activities: development of treatment plan with patient and/or surrogate as well as nursing, discussions with consultants, evaluation of patient's response to treatment, examination of patient, obtaining history from patient or surrogate, ordering and performing treatments and interventions, ordering and review of laboratory studies, ordering and review of radiographic studies, pulse oximetry and re-evaluation of patient's condition.   Results for orders placed or performed during the hospital encounter of 07/06/18  Comprehensive metabolic panel  Result Value Ref Range   Sodium 116 (LL) 135 - 145 mmol/L   Potassium 2.4 (LL) 3.5 - 5.1 mmol/L   Chloride 77 (L) 98 - 111 mmol/L   CO2 29 22 - 32 mmol/L   Glucose, Bld 112 (H) 70 - 99 mg/dL   BUN 5 (L) 6 - 20 mg/dL   Creatinine, Ser 0.44 0.44 - 1.00 mg/dL   Calcium 8.6 (L) 8.9 - 10.3 mg/dL   Total Protein 7.4 6.5 - 8.1 g/dL   Albumin 3.4 (L) 3.5 - 5.0 g/dL   AST 82 (H) 15 - 41 U/L   ALT 41 0 - 44 U/L   Alkaline Phosphatase 246 (H) 38 - 126 U/L   Total Bilirubin 1.1 0.3 - 1.2 mg/dL   GFR calc non Af Amer >60 >60 mL/min   GFR calc Af Amer >60 >60 mL/min   Anion gap 10 5 - 15  Acetaminophen level  Result Value Ref Range   Acetaminophen (Tylenol), Serum <10 (L) 10 - 30 ug/mL  Lipase, blood  Result Value Ref Range   Lipase 41 11 - 51 U/L  Troponin I  Result Value Ref Range   Troponin I <0.03 <0.03 ng/mL  Lactic acid, plasma  Result Value Ref Range   Lactic Acid, Venous 1.0 0.5 - 1.9 mmol/L  CBC with Differential  Result Value Ref Range   WBC 8.4  4.0 - 10.5 K/uL   RBC 4.82 3.87 - 5.11 MIL/uL   Hemoglobin 14.1 12.0 - 15.0 g/dL   HCT 39.4 36.0 - 46.0 %   MCV 81.7 78.0 - 100.0 fL   MCH 29.3 26.0 - 34.0 pg   MCHC 35.8 30.0 - 36.0 g/dL   RDW 15.2 11.5 - 15.5 %   Platelets 375 150 - 400 K/uL   Neutrophils Relative % 67 %   Neutro Abs 5.6 1.7 - 7.7 K/uL   Lymphocytes Relative 22 %   Lymphs Abs 1.8 0.7 - 4.0 K/uL   Monocytes Relative 11 %   Monocytes Absolute 0.9 0.1 - 1.0 K/uL   Eosinophils Relative 0 %   Eosinophils Absolute 0.0 0.0 - 0.7 K/uL   Basophils Relative 0 %   Basophils Absolute 0.0 0.0 - 0.1 K/uL  Protime-INR  Result Value Ref Range   Prothrombin Time 13.4 11.4 - 15.2 seconds   INR 5.80   Salicylate level  Result Value Ref Range   Salicylate Lvl <9.9 2.8 - 30.0 mg/dL  Magnesium  Result Value Ref Range   Magnesium 1.8 1.7 - 2.4 mg/dL    Dg Abd Acute W/chest Result Date: 07/06/2018 CLINICAL DATA:  Nodule vomiting 2 months. EXAM: DG ABDOMEN ACUTE W/ 1V CHEST COMPARISON:  Chest x-ray 06/12/2017 FINDINGS: Lungs are adequately inflated with 1 cm nodule opacity over the lateral left midlung. Linear scarring lateral left base. Subtle hazy density over the right upper lobe. No effusion. Cardiac silhouette is within normal. Mild increased  density over the AP window. Bowel gas pattern is nonobstructive. No free peritoneal air. Multiple surgical clips over the pelvis. Degenerate change of the spine and hips. Interbody fusion of L4-5. IMPRESSION: Subtle hazy density over the right upper lobe which may be due to acute infection. 1 cm nodular opacity over the lateral left midlung. Increased density and prominence of the AP window as cannot exclude mass/adenopathy. Consider contrast-enhanced chest CT for further evaluation. Nonobstructive bowel gas pattern. Electronically Signed   By: Marin Olp M.D.   On: 07/06/2018 14:43    Results for TEKESHIA, KLAHR (MRN [id number]) as of 07/06/2018 14:52  Ref. Range 07/05/2018 15:46 07/06/2018  13:10  AST Latest Ref Range: 15 - 41 U/L 92 (H) 82 (H)  ALT Latest Ref Range: 0 - 44 U/L 46 (H) 41     1515:  LFT's improving spontaneously from yesterday's values. Pt without RUQ abd tenderness. Potassium repleted IV and PO. Magnesium repleted IV. Pt orthostatic on VS; judicious IVF bolus given. XR as above; CT pending. Dx and testing d/w pt and family.  Questions answered.  Verb understanding, agreeable to admit. T/C returned from [facility] Dr. Nehemiah Settle, case discussed, including:  HPI, pertinent PM/SHx, VS/PE, dx testing, ED course and treatment:  Agreeable to admit.        Final Clinical Impressions(s) / ED Diagnoses   Final diagnoses:  None    ED Discharge Orders    None      Francine Graven, DO 07/07/18 1555

## 2018-07-06 NOTE — H&P (Signed)
History and Physical  KEERTHI HAZELL JGO:[id number] DOB: [birth year] DOA: 07/06/2018  Referring physician: Dr Thurnell Garbe, ED physician PCP: [name], MD  Outpatient Specialists:   Patient Coming From: home  Chief Complaint: set by PCP for low labs  HPI: [name] is a 62 y.o. female with a history of alpha-1 antitrypsin, COPD mixed type, fibromyalgia, GERD, IBS, hyperlipidemia, tobacco abuse, essential hypertension, fibromyalgia.  Patient was sent to the ER by PCP due to low labs.  She was initially seen about a month ago for cold.  She was put on azithromycin, but stopped after taking 1 tablet due to excessive nausea and vomiting.  She states that the nausea and vomiting triggered her reflux, causing her to have severe acid reflux.  Since that time, she has had diminished appetite and has had very little to eat.  She is drinking a lot of water and has urinated a lot.  No palliating or provoking factors.  She denies new medications, dose changes.  She is on chlorthalidone, but this dose has been stable for quite some time  Emergency Department Course: Sodium level 116, potassium 2.4.  Creatinine 0.44, BUN 5.  LFTs slightly increased: Alk phosphatase 246, AST 82 ALT 41.  Review of Systems:   Pt denies any fevers, chills, nausea, vomiting, diarrhea, constipation, abdominal pain, shortness of breath, dyspnea on exertion, orthopnea, cough, wheezing, palpitations, headache, vision changes, lightheadedness, dizziness, melena, rectal bleeding.  Review of systems are otherwise negative  Past Medical History:  Diagnosis Date  . Alpha-1-antitrypsin deficiency ([place])   . Cervical cancer ([place]) 2004  . COPD mixed type ([place]) 09/14/2007   PFT- 05/06/2013-reduced FVC may reflect effort but the overall pattern is moderate obstructive airways disease with response to bronchodilator, air trapping, normal diffusion. This doesn't fit entirely with emphysema.      . Exposure to hepatitis C 09/12/2016   . Fibromyalgia   . GERD (gastroesophageal reflux disease)   . Hyperlipidemia   . IBS (irritable bowel syndrome)   . Obstructive sleep apnea 06/02/2008   NPSG 06/07/08, AHI 6.9/ hr, weight 220 lbs    . Osteoporosis   . Pulmonary fibrosis, unspecified ([place]) 09/29/2016   CXR 03/28/2016  . Rhinitis, nonallergic 05/08/2012  . 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 Vitamin D deficiency    Past Surgical History:  Procedure Laterality Date  . ABDOMINAL HYSTERECTOMY    . LUMBAR DISC SURGERY     Social History:  reports that she quit smoking about 6 years ago. Her smoking use included cigarettes. She started smoking about 45 years ago. She smoked 0.50 packs per day. She has never used smokeless tobacco. She reports that she does not drink alcohol or use drugs. Patient lives at home  Allergies  Allergen Reactions  . Penicillins Nausea And Vomiting  . Actonel [Risedronate Sodium]     Caused her to retain fluid and stomach problems  . Advair Diskus [Fluticasone-Salmeterol]   . Alendronate Sodium     Stomach upset / esophageal burning  . Aspirin Nausea Only    Stomach burns; abdominal pain  . Azithromycin Nausea And Vomiting  . Levofloxacin     Reports hx of GI distress and GI bleed  . Morphine Itching and Swelling    Throat swells  . Nsaids     Swelling, GI upset   . Omnicef [Cefdinir]   . Erythromycin Rash  . Iodine Rash    Topical  and IV    Family History  Problem Relation Age of Onset  . Alpha-1 antitrypsin deficiency Sister   . Osteoporosis Sister   . Pancreatic cancer Mother 75  . Osteoporosis Mother   . Irregular heart beat Mother   . Heart attack Father 24       Died age 54  . Migraines Father   . CAD Sister 55       Died age 1  . Osteoporosis Sister   . Lung cancer Brother   . CAD Brother 41       CABG.  Died from leukemia  . Leukemia Brother   . Heart attack Brother   . Alpha-1 antitrypsin deficiency Other   . Heart  disease Brother   . Colon cancer Neg Hx       Prior to Admission medications   Medication Sig Start Date End Date Taking? Authorizing Provider  albuterol (PROVENTIL) (2.5 MG/3ML) 0.083% nebulizer solution Take 3 mLs (2.5 mg total) by nebulization every 6 (six) hours as needed. 06/07/17   [name], MD  amitriptyline (ELAVIL) 75 MG tablet Take 75 mg by mouth at bedtime.      [provider]  BEVESPI AEROSPHERE 9-4.8 MCG/ACT AERO 2 PUFFS 2 TIMES A DAY 06/20/18   Young, Tarri Fuller D, MD  chlorthalidone (HYGROTON) 25 MG tablet TAKE (1/2) TABLET DAILY. 04/23/18   Minus Breeding, MD  ezetimibe (ZETIA) 10 MG tablet TAKE 1 TABLET ONCE A DAY 05/24/18   [name], MD  FLUoxetine (PROZAC) 20 MG capsule TAKE (2) CAPSULES DAILY. 04/19/18   [name], MD  fluticasone (FLONASE) 50 MCG/ACT nasal spray USE 2 SPRAYS IN EACH NOSTRIL ONCE DAILY. 01/02/18   [name], MD  folic acid (FOLVITE) 1 MG tablet Take 1 mg by mouth daily.    [provider]  guaiFENesin (MUCINEX) 600 MG 12 hr tablet Take 1,200 mg by mouth 2 (two) times daily.      [provider]  HYDROcodone-acetaminophen (NORCO/VICODIN) 5-325 MG per tablet Take 1 tablet by mouth 3 (three) times daily.    [provider]  meclizine (ANTIVERT) 12.5 MG tablet Take 1 tablet (12.5 mg total) by mouth 3 (three) times daily as needed for dizziness. 07/19/16   [name], MD  Nebulizers (COMPRESSOR NEBULIZER) MISC 1 Device by Does not apply route as needed. 01/01/15   Baird Lyons D, MD  omeprazole (PRILOSEC) 40 MG capsule TAKE (1) CAPSULE DAILY 05/24/18   [name], MD  ondansetron (ZOFRAN ODT) 4 MG disintegrating tablet Take 1 tablet (4 mg total) by mouth every 8 (eight) hours as needed for nausea or vomiting. 06/18/18   [name], DO  PROAIR HFA 108 [phone] Base) MCG/ACT inhaler 2 PUFFS EVERY 4 HOURS AS NEEDED FOR WHEEZING 02/21/16   [name], MD  rosuvastatin (CRESTOR) 40 MG tablet TAKE 1/2  TABLET ONCE DAILY 05/22/18   [name], MD  umeclidinium-vilanterol (ANORO ELLIPTA) 62.5-25 MCG/INH AEPB Inhale 1 puff into the lungs daily. 06/03/18   Deneise Lever, MD    Physical Exam: BP 125/75   Pulse 82   Temp 97.8 F (36.6 C)   Resp 18   Ht _0  (1.651 m)   Wt 78 kg   SpO2 91%   BMI 28.62 kg/m   . General: Elderly female. Awake and alert and oriented x3. No acute cardiopulmonary distress.  Marland Kitchen HEENT: Normocephalic atraumatic.  Right and left ears normal in  appearance.  Pupils equal, round, reactive to light. Extraocular muscles are intact. Sclerae anicteric and noninjected.  Moist mucosal membranes. No mucosal lesions.  . Neck: Neck supple without lymphadenopathy. No carotid bruits. No masses palpated.  . Cardiovascular: Regular rate with normal S1-S2 sounds. No murmurs, rubs, gallops auscultated. No JVD.  Marland Kitchen Respiratory: Good respiratory effort with no wheezes, rales, rhonchi. Lungs clear to auscultation bilaterally.  No accessory muscle use. . Abdomen: Soft, nontender, nondistended. Active bowel sounds. No masses or hepatosplenomegaly  . Skin: No rashes, lesions, or ulcerations.  Dry, warm to touch. 2+ dorsalis pedis and radial pulses. . Musculoskeletal: No calf or leg pain. All major joints not erythematous nontender.  No upper or lower joint deformation.  Good ROM.  No contractures  . Psychiatric: Intact judgment and insight. Pleasant and cooperative. . Neurologic: No focal neurological deficits. Strength is 5/5 and symmetric in upper and lower extremities.  Cranial nerves II through XII are grossly intact.           Labs on Admission: I have personally reviewed following labs and imaging studies  CBC: Recent Labs  Lab 07/05/18 1546 07/06/18 1310  WBC 8.0 8.4  NEUTROABS 5.2 5.6  HGB 14.4 14.1  HCT 41.3 39.4  MCV 82 81.7  PLT 438 329   Basic Metabolic Panel: Recent Labs  Lab 07/05/18 1546 07/06/18 1310  NA 124* 116*  K 3.7 2.4*  CL 78* 77*  CO2 27 29    GLUCOSE 103* 112*  BUN 4* 5*  CREATININE 0.51* 0.44  CALCIUM 9.2 8.6*  MG  --  1.8   GFR: Estimated Creatinine Clearance: 77.2 mL/min (by C-G formula based on SCr of 0.44 mg/dL). Liver Function Tests: Recent Labs  Lab 07/05/18 1546 07/06/18 1310  AST 92* 82*  ALT 46* 41  ALKPHOS 312* 246*  BILITOT 0.6 1.1  PROT 7.1 7.4  ALBUMIN 4.0 3.4*   Recent Labs  Lab 07/06/18 1310  LIPASE 41   No results for input(s): AMMONIA in the last 168 hours. Coagulation Profile: Recent Labs  Lab 07/06/18 1310  INR 1.03   Cardiac Enzymes: Recent Labs  Lab 07/06/18 1310  TROPONINI <0.03   BNP (last 3 results) No results for input(s): PROBNP in the last 8760 hours. HbA1C: No results for input(s): HGBA1C in the last 72 hours. CBG: No results for input(s): GLUCAP in the last 168 hours. Lipid Profile: No results for input(s): CHOL, HDL, LDLCALC, TRIG, CHOLHDL, LDLDIRECT in the last 72 hours. Thyroid Function Tests: No results for input(s): TSH, T4TOTAL, FREET4, T3FREE, THYROIDAB in the last 72 hours. Anemia Panel: No results for input(s): VITAMINB12, FOLATE, FERRITIN, TIBC, IRON, RETICCTPCT in the last 72 hours. Urine analysis:    Component Value Date/Time   APPEARANCEUR Hazy (A) 07/19/2016 1512   GLUCOSEU Negative 07/19/2016 1512   BILIRUBINUR Negative 07/19/2016 1512   PROTEINUR Negative 07/19/2016 1512   UROBILINOGEN negative 07/14/2013 1553   NITRITE Positive (A) 07/19/2016 1512   LEUKOCYTESUR 2+ (A) 07/19/2016 1512   Sepsis Labs: _0 (procalcitonin:4,lacticidven:4) )No results found for this or any previous visit (from the past 240 hour(s)).   Radiological Exams on Admission: Dg Abd Acute W/chest  Result Date: 07/06/2018 CLINICAL DATA:  Nodule vomiting 2 months. EXAM: DG ABDOMEN ACUTE W/ 1V CHEST COMPARISON:  Chest x-ray 06/12/2017 FINDINGS: Lungs are adequately inflated with 1 cm nodule opacity over the lateral left midlung. Linear scarring lateral left base.  Subtle hazy density over the right upper lobe. No effusion. Cardiac silhouette  is within normal. Mild increased density over the AP window. Bowel gas pattern is nonobstructive. No free peritoneal air. Multiple surgical clips over the pelvis. Degenerate change of the spine and hips. Interbody fusion of L4-5. IMPRESSION: Subtle hazy density over the right upper lobe which may be due to acute infection. 1 cm nodular opacity over the lateral left midlung. Increased density and prominence of the AP window as cannot exclude mass/adenopathy. Consider contrast-enhanced chest CT for further evaluation. Nonobstructive bowel gas pattern. Electronically Signed   By: Marin Olp M.D.   On: 07/06/2018 14:43    EKG: Independently reviewed.  Sinus rhythm with right bundle branch block.  Assessment/Plan: Principal Problem:   Hyponatremia Active Problems:   Alpha-1-antitrypsin deficiency ([facility])   COPD mixed type ([place])   Essential hypertension   Hypochloremia   Hypokalemia    This patient was discussed with the ED physician, including pertinent vitals, physical exam findings, labs, and imaging.  We also discussed care given by the ED provider.  1. Hyponatremia a. Appears volume depleted b. Check urine studies: Urine osmolality, urine sodium, urine chloride c. Discontinue thiazide diuretic d. BMPs every 4 hours e. We will start with hydrating the patient with normal saline and continue to monitor closely f. Patient does have a lung nodule -will obtain CT to evaluate, especially in the setting of hyponatremia and concern for lung cancer 2. Hypokalemia a. Continue with potassium replacement b. We will continue to monitor potassium levels 3. Hypochloremia a. Likely secondary to thiazide diuretic b. Replacement and close monitor 4. Hypertension a. Normal 5. COPD 6. Alpha-1 antitrypsin a. This should explain the patient's abnormal LFTs  DVT prophylaxis: Lovenox Consultants: None Code Status:  Full Family Communication: Daughter present during interview and exam Disposition Plan: Patient to return home following normalization of electrolytes   Truett Mainland, DO [facility] Pager [phone]  If 7PM-7AM, please contact night-coverage [email or web address] Password [email or web address]

## 2018-07-06 NOTE — ED Notes (Signed)
Date and time results received: 07/06/18 1735 (use smartphrase ".now" to insert current time)  Test: Na Critical Value: 115  Name of Provider Notified: Nehemiah Settle MD  Orders Received? Or Actions Taken?: n/a

## 2018-07-06 NOTE — Progress Notes (Signed)
Patient Name: [name], female   DOB: 1956/07/07, 62 y.o.  MRN: [id number]  Not able to get tulvaptan tonight. Will give salt tablet and continue fluid restriction tonight.  Truett Mainland, DO 07/06/2018 9:20 PM

## 2018-07-06 NOTE — ED Notes (Signed)
Date and time results received: 07/06/18 @20 :54 (use smartphrase ".now" to insert current time)  Test: sodium 116 Critical Value: 116  Name of Provider Notified: Dr Nehemiah Settle  Orders Received? Or Actions Taken?: no additional orders given,

## 2018-07-07 ENCOUNTER — Encounter (HOSPITAL_COMMUNITY): Payer: Self-pay

## 2018-07-07 DIAGNOSIS — R918 Other nonspecific abnormal finding of lung field: Secondary | ICD-10-CM | POA: Diagnosis present

## 2018-07-07 DIAGNOSIS — J449 Chronic obstructive pulmonary disease, unspecified: Secondary | ICD-10-CM

## 2018-07-07 DIAGNOSIS — E8801 Alpha-1-antitrypsin deficiency: Secondary | ICD-10-CM

## 2018-07-07 DIAGNOSIS — E871 Hypo-osmolality and hyponatremia: Secondary | ICD-10-CM

## 2018-07-07 LAB — BASIC METABOLIC PANEL
Anion gap: 12 (ref 5–15)
Anion gap: 9 (ref 5–15)
Anion gap: 9 (ref 5–15)
Anion gap: 10 (ref 5–15)
BUN: <5 mg/dL — ABNORMAL LOW (ref 6–20)
BUN: 5 mg/dL — ABNORMAL LOW (ref 6–20)
BUN: 5 mg/dL — ABNORMAL LOW (ref 6–20)
BUN: 6 mg/dL (ref 6–20)
CALCIUM: 9.1 mg/dL (ref 8.9–10.3)
CALCIUM: 8.8 mg/dL — AB (ref 8.9–10.3)
CALCIUM: 8.8 mg/dL — AB (ref 8.9–10.3)
CALCIUM: 9 mg/dL (ref 8.9–10.3)
CO2: 28 mmol/L (ref 22–32)
CO2: 27 mmol/L (ref 22–32)
CO2: 28 mmol/L (ref 22–32)
CO2: 27 mmol/L (ref 22–32)
CREATININE: 0.44 mg/dL (ref 0.44–1.00)
CREATININE: 0.46 mg/dL (ref 0.44–1.00)
CREATININE: 0.47 mg/dL (ref 0.44–1.00)
CREATININE: 0.49 mg/dL (ref 0.44–1.00)
Chloride: 93 mmol/L — ABNORMAL LOW (ref 98–111)
Chloride: 96 mmol/L — ABNORMAL LOW (ref 98–111)
Chloride: 95 mmol/L — ABNORMAL LOW (ref 98–111)
Chloride: 95 mmol/L — ABNORMAL LOW (ref 98–111)
GFR calc non Af Amer: >60 mL/min (ref >60)
GFR calc non Af Amer: >60 mL/min (ref >60)
GFR calc non Af Amer: >60 mL/min (ref >60)
GLUCOSE: 100 mg/dL — AB (ref 70–99)
GLUCOSE: 104 mg/dL — AB (ref 70–99)
Glucose, Bld: 115 mg/dL — ABNORMAL HIGH (ref 70–99)
Glucose, Bld: 114 mg/dL — ABNORMAL HIGH (ref 70–99)
Potassium: 3 mmol/L — ABNORMAL LOW (ref 3.5–5.1)
Potassium: 3.4 mmol/L — ABNORMAL LOW (ref 3.5–5.1)
Potassium: 3.5 mmol/L (ref 3.5–5.1)
Potassium: 3.4 mmol/L — ABNORMAL LOW (ref 3.5–5.1)
SODIUM: 133 mmol/L — AB (ref 135–145)
Sodium: 132 mmol/L — ABNORMAL LOW (ref 135–145)
Sodium: 132 mmol/L — ABNORMAL LOW (ref 135–145)
Sodium: 132 mmol/L — ABNORMAL LOW (ref 135–145)

## 2018-07-07 LAB — LIPID PANEL
CHOL/HDL RATIO: 4.5 ratio
Cholesterol: 140 mg/dL (ref 0–200)
HDL: 31 mg/dL — ABNORMAL LOW (ref >40)
LDL Cholesterol: 89 mg/dL (ref 0–99)
Triglycerides: 99 mg/dL (ref <150)
VLDL: 20 mg/dL (ref 0–40)

## 2018-07-07 LAB — URIC ACID: Uric Acid, Serum: 2.5 mg/dL (ref 2.5–7.1)

## 2018-07-07 LAB — HEPATIC FUNCTION PANEL
ALBUMIN: 3.2 g/dL — AB (ref 3.5–5.0)
ALT: 42 U/L (ref 0–44)
AST: 81 U/L — ABNORMAL HIGH (ref 15–41)
Alkaline Phosphatase: 248 U/L — ABNORMAL HIGH (ref 38–126)
Bilirubin, Direct: 0.2 mg/dL (ref 0.0–0.2)
Indirect Bilirubin: 0.4 mg/dL (ref 0.3–0.9)
TOTAL PROTEIN: 7.5 g/dL (ref 6.5–8.1)
Total Bilirubin: 0.6 mg/dL (ref 0.3–1.2)

## 2018-07-07 LAB — OSMOLALITY: Osmolality: 272 mOsm/kg — ABNORMAL LOW (ref 275–295)

## 2018-07-07 LAB — TSH: TSH: 3.231 u[IU]/mL (ref 0.350–4.500)

## 2018-07-07 LAB — T4, FREE: FREE T4: 1.09 ng/dL (ref 0.82–1.77)

## 2018-07-07 LAB — BRAIN NATRIURETIC PEPTIDE: B NATRIURETIC PEPTIDE 5: 28.4 pg/mL (ref 0.0–100.0)

## 2018-07-07 LAB — CORTISOL: CORTISOL PLASMA: 23.2 ug/dL

## 2018-07-07 LAB — MRSA PCR SCREENING: MRSA BY PCR: NEGATIVE

## 2018-07-07 MED ORDER — DM-GUAIFENESIN ER 30-600 MG PO TB12
1.0000 | ORAL_TABLET | Freq: Two times a day (BID) | ORAL | Status: DC
  Filled 2018-07-07: qty 1

## 2018-07-07 MED ORDER — LACTATED RINGERS IV SOLN
INTRAVENOUS | Status: DC
  Administered 2018-07-07: 12:00:00 via INTRAVENOUS

## 2018-07-07 MED ORDER — ALBUTEROL SULFATE (2.5 MG/3ML) 0.083% IN NEBU
2.5000 mg | INHALATION_SOLUTION | RESPIRATORY_TRACT | Status: DC | PRN
  Filled 2018-07-07: qty 3

## 2018-07-07 MED ORDER — MECLIZINE HCL 25 MG PO TABS
12.5000 mg | ORAL_TABLET | Freq: Three times a day (TID) | ORAL | Status: DC | PRN
  Filled 2018-07-07: qty 0.5

## 2018-07-07 MED ORDER — GUAIFENESIN ER 600 MG PO TB12
600.0000 mg | ORAL_TABLET | Freq: Two times a day (BID) | ORAL | Status: DC
  Administered 2018-07-07 – 2018-07-10 (×7): 600 mg via ORAL
  Filled 2018-07-07 (×6): qty 1

## 2018-07-07 MED ORDER — SODIUM CHLORIDE 0.9 % IV SOLN
INTRAVENOUS | Status: DC
  Administered 2018-07-07: 10:00:00 via INTRAVENOUS

## 2018-07-07 MED ORDER — ORAL CARE MOUTH RINSE
15.0000 mL | Freq: Two times a day (BID) | OROMUCOSAL | Status: DC
  Administered 2018-07-07 – 2018-07-09 (×6): 15 mL via OROMUCOSAL

## 2018-07-07 MED ORDER — GUAIFENESIN 100 MG/5ML PO SOLN: 5.0000 mL | ORAL | Status: DC | PRN

## 2018-07-07 MED ORDER — GLYCOPYRROLATE-FORMOTEROL 9-4.8 MCG/ACT IN AERO
2.0000 | INHALATION_SPRAY | Freq: Two times a day (BID) | RESPIRATORY_TRACT | Status: DC
  Administered 2018-07-07 – 2018-07-09 (×4): 2 via RESPIRATORY_TRACT
  Administered 2018-07-10: 09:00:00 via RESPIRATORY_TRACT

## 2018-07-07 MED ORDER — BENZONATATE 100 MG PO CAPS
100.0000 mg | ORAL_CAPSULE | Freq: Three times a day (TID) | ORAL | Status: DC
  Administered 2018-07-07 – 2018-07-10 (×10): 100 mg via ORAL
  Filled 2018-07-07 (×10): qty 1

## 2018-07-07 MED ORDER — GUAIFENESIN-DM 100-10 MG/5ML PO SYRP: 5.0000 mL | ORAL_SOLUTION | ORAL | Status: DC | PRN

## 2018-07-07 NOTE — Consult Note (Signed)
PULMONARY / CRITICAL CARE MEDICINE   Name: SALEEN PEDEN MRN: [id number] DOB: September 11, 1956    ADMISSION DATE:  07/06/2018 CONSULTATION DATE:   07/07/18   REFERRING MD:  [facility]  CHIEF COMPLAINT:   n and V/ lung mass  HISTORY OF PRESENT ILLNESS:   53 yowf quit smoking around 2013 with documented ZZ and mild/moderate airflow obst with last pfts 12/13/17 FEV1/VC=   1.43/2.06 p 13% resp to saba = ratio 69% (so actually barely meets the classic criteria for copd if fvc is used instead of VC) with FTT x 6 weeks = insidious onset gradually worse gen weakness/ anorexia, n and v with mild cough> mucoid only drinking bottled water by the gallon and admitted with Na 116 with LLL atx assoc with L hilar mass on CT chest and pccm service asked to see. Denies ha or neuro symptoms   No obvious day to day or daytime variability or assoc excess/ purulent sputum or mucus plugs or hemoptysis or cp or chest tightness, subjective wheeze or overt sinus or hb symptoms.   Sleeping on 2 pillows  without nocturnal  or early am exacerbation  of respiratory  c/o's or need for noct saba. Also denies any obvious fluctuation of symptoms with weather or environmental changes or other aggravating or alleviating factors except as outlined above   No unusual exposure hx or h/o childhood pna/ asthma or knowledge of premature birth.  Current Allergies, Complete Past Medical History, Past Surgical History, Family History, and Social History were reviewed in [employer] record.  ROS  The following are not active complaints unless bolded Hoarseness, sore throat, dysphagia, dental problems, itching, sneezing,  nasal congestion or discharge of excess mucus or purulent secretions, ear ache,   fever, chills, sweats, unintended wt loss or wt gain, classically pleuritic or exertional cp,  orthopnea pnd or arm/hand swelling  or leg swelling, presyncope, palpitations, abdominal pain, anorexia, nausea, vomiting, diarrhea   or change in bowel habits or change in bladder habits, change in stools or change in urine, dysuria, hematuria,  rash, arthralgias, visual complaints, headache, numbness, weakness or ataxia or problems with walking or coordination,  change in mood or  memory.           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  PAST SURGICAL HISTORY: She  has a past surgical history that includes Lumbar disc surgery and Abdominal hysterectomy.  Allergies  Allergen Reactions  . Penicillins Nausea And Vomiting    Has patient had a PCN reaction causing immediate rash, facial/tongue/throat swelling, SOB or lightheadedness with hypotension: Unknown Has patient had a PCN reaction causing severe rash involving mucus membranes or skin necrosis: Unknown Has patient had a PCN reaction that required hospitalization: Unknown Has patient had a PCN reaction occurring within the last 10 years: Unknown If all of the above answers are "NO", then may proceed with Cephalosporin use.   Marland Kitchen Actonel [Risedronate Sodium]     Caused her to retain fluid and stomach problems  . Advair Diskus [Fluticasone-Salmeterol]   . Alendronate Sodium     Stomach upset / esophageal burning  . Aspirin Nausea Only    Stomach burns; abdominal  pain  . Azithromycin Nausea And Vomiting  . Levofloxacin     Reports hx of GI distress and GI bleed  . Morphine Itching and Swelling    Throat swells  . Nsaids     Swelling, GI upset   . Omnicef [Cefdinir]   . Erythromycin Rash  . Iodine Rash     Topical and IV    No current facility-administered medications on file prior to encounter.    Current Outpatient Medications on File Prior to Encounter  Medication Sig  . albuterol (PROVENTIL) (2.5 MG/3ML) 0.083% nebulizer solution Take 3 mLs (2.5 mg total) by nebulization every 6 (six) hours as needed.  Marland Kitchen amitriptyline (ELAVIL) 75 MG tablet Take 75 mg by mouth at bedtime.    Marland Kitchen BEVESPI AEROSPHERE 9-4.8 MCG/ACT AERO 2 PUFFS 2 TIMES A DAY  . chlorthalidone (HYGROTON) 25 MG tablet TAKE (1/2) TABLET DAILY.  Marland Kitchen ezetimibe (ZETIA) 10 MG tablet TAKE 1 TABLET ONCE A DAY  . FLUoxetine (PROZAC) 20 MG capsule TAKE (2) CAPSULES DAILY.  . fluticasone (FLONASE) 50 MCG/ACT nasal spray USE 2 SPRAYS IN EACH NOSTRIL ONCE DAILY.  . folic acid (FOLVITE) 1 MG tablet Take 1 mg by mouth daily.  Marland Kitchen guaiFENesin (MUCINEX) 600 MG 12 hr tablet Take 1,200 mg by mouth 2 (two) times daily.    Marland Kitchen HYDROcodone-acetaminophen (NORCO/VICODIN) 5-325 MG tablet Take 1 tablet by mouth 3 (three) times daily.  . meclizine (ANTIVERT) 12.5 MG tablet Take 1 tablet (12.5 mg total) by mouth 3 (three) times daily as needed for dizziness.  . Nebulizers (COMPRESSOR NEBULIZER) MISC 1 Device by Does not apply route as needed.  Marland Kitchen omeprazole (PRILOSEC) 40 MG capsule TAKE (1) CAPSULE DAILY  . ondansetron (ZOFRAN ODT) 4 MG disintegrating tablet Take 1 tablet (4 mg total) by mouth every 8 (eight) hours as needed for nausea or vomiting.  Marland Kitchen PROAIR HFA 108 (90 Base) MCG/ACT inhaler 2 PUFFS EVERY 4 HOURS AS NEEDED FOR WHEEZING  . rosuvastatin (CRESTOR) 40 MG tablet TAKE 1/2 TABLET ONCE DAILY  . umeclidinium-vilanterol (ANORO ELLIPTA) 62.5-25 MCG/INH AEPB Inhale 1 puff into the lungs daily.    FAMILY HISTORY:  Her family history includes Alpha-1 antitrypsin deficiency in her other and sister; CAD (age of onset: 86) in her brother; CAD (age of onset: 24) in her sister; Heart attack in her brother; Heart attack (age of onset: 55) in her father; Heart  disease in her brother; Irregular heart beat in her mother; Leukemia in her brother; Lung cancer in her brother; Migraines in her father; Osteoporosis in her mother, sister, and sister; Pancreatic cancer (age of onset: 63) in her mother. There is no history of Colon cancer.  SOCIAL HISTORY: She  reports that she quit smoking about 6 years ago. Her smoking use included cigarettes. She started smoking about 45 years ago. She smoked 0.50 packs per day. She has never used smokeless tobacco. She reports that she does not drink alcohol or use drugs.    SUBJECTIVE:   comfortable lying back at 45 degrees hob on 2lpm  VITAL SIGNS: BP 121/61   Pulse 91   Temp 97.6 F (36.4 C) (Oral)   Resp 17   Ht '5\' 5"'$  (1.651 m)   Wt 77.8 kg   SpO2 93%   BMI 28.54 kg/m   HEMODYNAMICS:    VENTILATOR SETTINGS:    INTAKE / OUTPUT: I/O last 3 completed shifts: In: 1452.5 [P.O.:120; I.V.:475.8; IV Piggyback:856.7] Out: -   PHYSICAL EXAMINATION: General:  Chronically  ill eldelry wf nad looks > stated age Neuro:  Alert/ no obvious deficits HEENT:  nl Cardiovascular:  nl Lungs:  Decreased bs L base s localized wheeze though has some scattered rhonchi  Abdomen:  Soft / benign Musculoskeletal:  Nl  Skin:  No breakdown   LABS:  BMET Recent Labs  Lab 07/06/18 2002 07/07/18 0922 07/07/18 1235  NA 116* 133* 132*  K 3.0* 3.0* 3.4*  CL 81* 93* 96*  CO2 '25 28 27  '$ BUN 5* <5* 5*  CREATININE 0.49 0.44 0.46  GLUCOSE 125* 115* 114*    Electrolytes Recent Labs  Lab 07/06/18 1310  07/06/18 2002 07/07/18 0922 07/07/18 1235  CALCIUM 8.6*   < > 7.8* 9.1 8.8*  MG 1.8  --   --   --   --    < > = values in this interval not displayed.    CBC Recent Labs  Lab 07/05/18 1546 07/06/18 1310  WBC 8.0 8.4  HGB 14.4 14.1  HCT 41.3 39.4  PLT 438 375    Coag's Recent Labs  Lab 07/06/18 1310  INR 1.03    Sepsis Markers Recent Labs  Lab 07/06/18 1315  LATICACIDVEN 1.0    ABG No results  for input(s): PHART, PCO2ART, PO2ART in the last 168 hours.  Liver Enzymes Recent Labs  Lab 07/05/18 1546 07/06/18 1310 07/07/18 0922  AST 92* 82* 81*  ALT 46* 41 42  ALKPHOS 312* 246* 248*  BILITOT 0.6 1.1 0.6  ALBUMIN 4.0 3.4* 3.2*    Cardiac Enzymes Recent Labs  Lab 07/06/18 1310  TROPONINI <0.03    Glucose No results for input(s): GLUCAP in the last 168 hours.  Imaging Ct Chest W Contrast  Addendum Date: 07/06/2018   ADDENDUM REPORT: 07/06/2018 18:12 ADDENDUM: These results were called by telephone at the time of interpretation on 07/06/2018 at 5:20 pm to Dr. Nehemiah Settle, who verbally acknowledged these results. Electronically Signed   By: Fidela Salisbury M.D.   On: 07/06/2018 18:12   Result Date: 07/06/2018 CLINICAL DATA:  Elevated liver function tests. EXAM: CT CHEST WITH CONTRAST TECHNIQUE: Multidetector CT imaging of the chest was performed during intravenous contrast administration. CONTRAST:  78m OMNIPAQUE IOHEXOL 300 MG/ML  SOLN COMPARISON:  Abdominal series radiograph 07/06/2018 FINDINGS: Cardiovascular: The heart is normal in size. No pericardial effusion. Calcific atherosclerotic disease of the coronary arteries and aorta, mild. Mediastinum/Nodes: Left-sided mediastinal lymphadenopathy at all lymph node station. Index abnormal lymph node in the AP window measures 2.2 cm in short axis. Subcarinal lymphadenopathy is also present. In the left hilar/subhilar region there is a soft tissue mass versus conglomerate of abnormal lymph nodes which measures approximately 6.0 x 3.1 x 5.8 cm. This mass causes compression of the left lower lobar pulmonary arterial branches and left lower lobar main bronchus, with subsequent complete collapse of the left lower lobe. There is also mass effect to the lower esophagus. Lungs/Pleura: Post compressive atelectasis of the left lower lobe. Additionally, there are multiple areas of ground-glass opacity in bilateral lungs. An index lesion in the  periphery of the right upper lobe measures 4.0 x 3.8 cm, image 42/155. Upper Abdomen: Diffusely heterogeneous nodular appearance of the liver with lobulated contours. 1.3 cm in short axis lymph node in the gastrohepatic legs. Other small lymph nodes in porta hepatis. Musculoskeletal: No chest wall abnormality. No acute or significant osseous findings. IMPRESSION: Predominantly left-sided mediastinal, hilar and subcarinal lymphadenopathy. A 6 cm left hilar/subhilar mass may represent a primary malignancy versus  a conglomerate of abnormal lymph nodes. It causes compression of the left lower lobe pulmonary arterial branches, left lower lobe main bronchus and the lower esophagus. Post compressive complete collapse of the left lower lobe. Multiple areas of ground-glass opacity in bilateral lungs. The index lesion in the periphery of the right upper lobe measures 4 cm. These may represent infectious or inflammatory consolidation, however bronchoalveolar carcinoma cannot be excluded. Diffusely heterogeneous nodular appearance of the liver. This may represent changes of cirrhosis or alternatively diffuse hepatic metastatic disease. Moderate lymphadenopathy in the upper abdomen. Bronchoscopy with tissue sampling may be considered to arrive to a histologic diagnosis. Aortic Atherosclerosis (ICD10-I70.0). Electronically Signed: By: Fidela Salisbury M.D. On: 07/06/2018 17:12   Dg Abd Acute W/chest  Result Date: 07/06/2018 CLINICAL DATA:  Nodule vomiting 2 months. EXAM: DG ABDOMEN ACUTE W/ 1V CHEST COMPARISON:  Chest x-ray 06/12/2017 FINDINGS: Lungs are adequately inflated with 1 cm nodule opacity over the lateral left midlung. Linear scarring lateral left base. Subtle hazy density over the right upper lobe. No effusion. Cardiac silhouette is within normal. Mild increased density over the AP window. Bowel gas pattern is nonobstructive. No free peritoneal air. Multiple surgical clips over the pelvis. Degenerate change of  the spine and hips. Interbody fusion of L4-5. IMPRESSION: Subtle hazy density over the right upper lobe which may be due to acute infection. 1 cm nodular opacity over the lateral left midlung. Increased density and prominence of the AP window as cannot exclude mass/adenopathy. Consider contrast-enhanced chest CT for further evaluation. Nonobstructive bowel gas pattern. Electronically Signed   By: Marin Olp M.D.   On: 07/06/2018 14:43        ASSESSMENT / PLAN:   1)  LLL atx /L hilar  lung mass c/w bronchogenic ca  Vs benign lesion like carcinoid or less likely fb asp - fob next step   2) bilateral nodular appearing areas of consolidation ? Met dz vs areas of asp injury/ organizing pna  Will need to be addressed as outpt with PET   3) severe hypernatremia s classic siadh hx or urine studies c/w drinking too much water while on HCTZ  - rec avoid excess  free water and hctz and see where the Na ends up    Discussed in detail all the  indications, usual  risks and alternatives  relative to the benefits with patient and husband at bedside  who agree  to proceed with w/u as outlined.     Christinia Gully, MD Pulmonary [facility] [place] [phone] After 5:30 PM or weekends, use [facility]per [phone]

## 2018-07-07 NOTE — Progress Notes (Signed)
[facility] Progress Note  Patient: [name] PYP:[id number]   PCP: [name], MD DOB: August 09, 1956   DOA: 07/06/2018   DOS: 07/07/2018   Date of Service: the patient was seen and examined on 07/07/2018  Subjective: Feeling better, no acute complaint.  No focal weakness.  Brief hospital course: Pt. with PMH of known antitrypsin deficiency; admitted on 07/06/2018, presented with complaint of abnormal lab, was found to have hyponatremia and lung mass. Currently further plan is continue close monitoring.  Assessment and Plan: 1.  Hypoosmolar hypovolemic hyponatremia. Responding to IV fluid as well as fluid restriction and salt tablets. Sodium has risen from 116 to 133 in less than 24-hour. 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 Patient does not have any focal deficit on exam for concern for CNS derangements from rapid correction of sodium. For today we will continue to monitor every 4 hours neuro check. Discontinue normal saline to LR. Discussed with nephrology, discontinue tolvaptan, discontinue salt tablets.  Also discontinue free water restriction. Currently patient is on amitriptyline, Prozac which can also lead to hyponatremia therefore we will discontinue that for now. Also holding HCTZ.  Patient has poor p.o. intake prior to admission but currently appears to be tolerating oral diet. Monitor in the stepdown unit.  2.  Left lung mass. Pulmonary consulted, will need bronchoscopy and biopsy.  3.  Alpha-1 antitrypsin deficiency. Patient is due for her weekly injection on Monday.  Will discuss with pulmonary regarding the dose.  4.  Hypokalemia, hypochloremia. Improved.  Monitor.  5.  Hypertension. Orthostatic hypotension. Holding antihypertensive medication for now. Continue IV fluids.  Diet: cardiac diet DVT Prophylaxis: subcutaneous Heparin  Advance goals of care discussion: full code  Family Communication:  family was present at bedside, at the time of interview. The pt provided permission to discuss medical plan with the family. Opportunity was given to ask question and all questions were answered satisfactorily.   Disposition:  Discharge to home.  Consultants: PCCM  Procedures: none  Antibiotics: Anti-infectives (From admission, onward)   None       Objective: Physical Exam: Vitals:   07/07/18 0945 07/07/18 1000 07/07/18 1200 07/07/18 1228  BP: 126/64 112/60 121/61   Pulse: 97 89 91   Resp: 18 20 17    Temp:    97.6 F (36.4 C)  TempSrc:    Oral  SpO2: 93% 95% 93%   Weight:      Height:        Intake/Output Summary (Last 24 hours) at 07/07/2018 1413 Last data filed at 07/07/2018 1100 Gross per 24 hour  Intake 1766.93 ml  Output 1200 ml  Net 566.93 ml   Filed Weights   07/06/18 1240 07/07/18 0033 07/07/18 0100  Weight: 78 kg 77.8 kg 77.8 kg   General: Alert, Awake and Oriented to Time, Place and Person. Appear in mild distress, affect appropriate Eyes: PERRL, Conjunctiva normal ENT: Oral Mucosa clear moist. Neck: no JVD, no Abnormal Mass Or lumps Cardiovascular: S1 and S2 Present, no Murmur, Peripheral Pulses Present Respiratory: normal respiratory effort, Bilateral Air entry equal and Decreased, no use of accessory muscle, Clear to Auscultation, no Crackles, no wheezes Abdomen: Bowel Sound present, Soft and no tenderness, no hernia Skin: no redness, no Rash, no induration Extremities: no Pedal edema, no calf tenderness Neurologic: Grossly no focal neuro deficit. Bilaterally Equal motor strength  Data Reviewed: CBC: Recent Labs  Lab 07/05/18 1546 07/06/18 1310  WBC 8.0  8.4  NEUTROABS 5.2 5.6  HGB 14.4 14.1  HCT 41.3 39.4  MCV 82 81.7  PLT 438 701   Basic Metabolic Panel: Recent Labs  Lab 07/06/18 1310 07/06/18 1650 07/06/18 2002 07/07/18 0922 07/07/18 1235  NA 116* 115* 116* 133* 132*  K 2.4* 2.8* 3.0* 3.0* 3.4*  CL 77* 78* 81* 93* 96*  CO2 29 27  25 28 27   GLUCOSE 112* 93 125* 115* 114*  BUN 5* <5* 5* <5* 5*  CREATININE 0.44 0.37* 0.49 0.44 0.46  CALCIUM 8.6* 7.8* 7.8* 9.1 8.8*  MG 1.8  --   --   --   --     Liver Function Tests: Recent Labs  Lab 07/05/18 1546 07/06/18 1310 07/07/18 0922  AST 92* 82* 81*  ALT 46* 41 42  ALKPHOS 312* 246* 248*  BILITOT 0.6 1.1 0.6  PROT 7.1 7.4 7.5  ALBUMIN 4.0 3.4* 3.2*   Recent Labs  Lab 07/06/18 1310  LIPASE 41   No results for input(s): AMMONIA in the last 168 hours. Coagulation Profile: Recent Labs  Lab 07/06/18 1310  INR 1.03   Cardiac Enzymes: Recent Labs  Lab 07/06/18 1310  TROPONINI <0.03   BNP (last 3 results) No results for input(s): PROBNP in the last 8760 hours. CBG: No results for input(s): GLUCAP in the last 168 hours. Studies: Ct Chest W Contrast  Addendum Date: 07/06/2018   ADDENDUM REPORT: 07/06/2018 18:12 ADDENDUM: These results were called by telephone at the time of interpretation on 07/06/2018 at 5:20 pm to Dr. Nehemiah Settle, who verbally acknowledged these results. Electronically Signed   By: Fidela Salisbury M.D.   On: 07/06/2018 18:12   Result Date: 07/06/2018 CLINICAL DATA:  Elevated liver function tests. EXAM: CT CHEST WITH CONTRAST TECHNIQUE: Multidetector CT imaging of the chest was performed during intravenous contrast administration. CONTRAST:  71mL OMNIPAQUE IOHEXOL 300 MG/ML  SOLN COMPARISON:  Abdominal series radiograph 07/06/2018 FINDINGS: Cardiovascular: The heart is normal in size. No pericardial effusion. Calcific atherosclerotic disease of the coronary arteries and aorta, mild. Mediastinum/Nodes: Left-sided mediastinal lymphadenopathy at all lymph node station. Index abnormal lymph node in the AP window measures 2.2 cm in short axis. Subcarinal lymphadenopathy is also present. In the left hilar/subhilar region there is a soft tissue mass versus conglomerate of abnormal lymph nodes which measures approximately 6.0 x 3.1 x 5.8 cm. This mass  causes compression of the left lower lobar pulmonary arterial branches and left lower lobar main bronchus, with subsequent complete collapse of the left lower lobe. There is also mass effect to the lower esophagus. Lungs/Pleura: Post compressive atelectasis of the left lower lobe. Additionally, there are multiple areas of ground-glass opacity in bilateral lungs. An index lesion in the periphery of the right upper lobe measures 4.0 x 3.8 cm, image 42/155. Upper Abdomen: Diffusely heterogeneous nodular appearance of the liver with lobulated contours. 1.3 cm in short axis lymph node in the gastrohepatic legs. Other small lymph nodes in porta hepatis. Musculoskeletal: No chest wall abnormality. No acute or significant osseous findings. IMPRESSION: Predominantly left-sided mediastinal, hilar and subcarinal lymphadenopathy. A 6 cm left hilar/subhilar mass may represent a primary malignancy versus a conglomerate of abnormal lymph nodes. It causes compression of the left lower lobe pulmonary arterial branches, left lower lobe main bronchus and the lower esophagus. Post compressive complete collapse of the left lower lobe. Multiple areas of ground-glass opacity in bilateral lungs. The index lesion in the periphery of the right upper lobe measures 4 cm. These  may represent infectious or inflammatory consolidation, however bronchoalveolar carcinoma cannot be excluded. Diffusely heterogeneous nodular appearance of the liver. This may represent changes of cirrhosis or alternatively diffuse hepatic metastatic disease. Moderate lymphadenopathy in the upper abdomen. Bronchoscopy with tissue sampling may be considered to arrive to a histologic diagnosis. Aortic Atherosclerosis (ICD10-I70.0). Electronically Signed: By: Fidela Salisbury M.D. On: 07/06/2018 17:12   Dg Abd Acute W/chest  Result Date: 07/06/2018 CLINICAL DATA:  Nodule vomiting 2 months. EXAM: DG ABDOMEN ACUTE W/ 1V CHEST COMPARISON:  Chest x-ray 06/12/2017  FINDINGS: Lungs are adequately inflated with 1 cm nodule opacity over the lateral left midlung. Linear scarring lateral left base. Subtle hazy density over the right upper lobe. No effusion. Cardiac silhouette is within normal. Mild increased density over the AP window. Bowel gas pattern is nonobstructive. No free peritoneal air. Multiple surgical clips over the pelvis. Degenerate change of the spine and hips. Interbody fusion of L4-5. IMPRESSION: Subtle hazy density over the right upper lobe which may be due to acute infection. 1 cm nodular opacity over the lateral left midlung. Increased density and prominence of the AP window as cannot exclude mass/adenopathy. Consider contrast-enhanced chest CT for further evaluation. Nonobstructive bowel gas pattern. Electronically Signed   By: Marin Olp M.D.   On: 07/06/2018 14:43    Scheduled Meds: . benzonatate  100 mg Oral TID  . enoxaparin (LOVENOX) injection  40 mg Subcutaneous Q24H  . ezetimibe  10 mg Oral Daily  . Glycopyrrolate-Formoterol  2 puff Inhalation BID  . guaiFENesin  600 mg Oral BID  . HYDROcodone-acetaminophen  1 tablet Oral TID  . mouth rinse  15 mL Mouth Rinse BID  . pantoprazole  40 mg Oral Daily  . potassium chloride  40 mEq Oral BID  . rosuvastatin  20 mg Oral q1800   Continuous Infusions: . lactated ringers 50 mL/hr at 07/07/18 1226   PRN Meds: acetaminophen **OR** acetaminophen, albuterol, meclizine, ondansetron **OR** ondansetron (ZOFRAN) IV  Time spent: The patient is critically ill with multiple organ systems failure and requires high complexity decision making for assessment and support, frequent evaluation and titration of therapies. Critical Care Time devoted to patient care services described in this note is 35 minutes   Author: Berle Mull, MD [facility] Pager: [phone] 07/07/2018 2:13 PM  If 7PM-7AM, please contact night-coverage at [email or web address], password [email or web address]

## 2018-07-07 NOTE — Progress Notes (Signed)
Per patient and daughter, patient took her home medications that were not given to her this morning. These medications include fluoxetine, folic acid, and vitamin d. Education provided to patient about the importance of not taking medications without the nurses or doctors knowing. Patient verbalized understanding. MD made aware. Will continue to monitor.

## 2018-07-08 ENCOUNTER — Inpatient Hospital Stay (HOSPITAL_COMMUNITY): Payer: Medicare Other

## 2018-07-08 ENCOUNTER — Encounter (HOSPITAL_COMMUNITY): Payer: Self-pay | Admitting: *Deleted

## 2018-07-08 DIAGNOSIS — E440 Moderate protein-calorie malnutrition: Secondary | ICD-10-CM

## 2018-07-08 LAB — CBC
HCT: 37.8 % (ref 36.0–46.0)
HEMOGLOBIN: 13.1 g/dL (ref 12.0–15.0)
MCH: 29.2 pg (ref 26.0–34.0)
MCHC: 34.7 g/dL (ref 30.0–36.0)
MCV: 84.2 fL (ref 78.0–100.0)
Platelets: 371 10*3/uL (ref 150–400)
RBC: 4.49 MIL/uL (ref 3.87–5.11)
RDW: 16.3 % — ABNORMAL HIGH (ref 11.5–15.5)
WBC: 8 10*3/uL (ref 4.0–10.5)

## 2018-07-08 LAB — BASIC METABOLIC PANEL
Anion gap: 9 (ref 5–15)
Anion gap: 9 (ref 5–15)
BUN: 7 mg/dL (ref 6–20)
BUN: 5 mg/dL — ABNORMAL LOW (ref 6–20)
CALCIUM: 8.9 mg/dL (ref 8.9–10.3)
CALCIUM: 9.1 mg/dL (ref 8.9–10.3)
CO2: 29 mmol/L (ref 22–32)
CO2: 29 mmol/L (ref 22–32)
CREATININE: 0.4 mg/dL — AB (ref 0.44–1.00)
Chloride: 94 mmol/L — ABNORMAL LOW (ref 98–111)
Chloride: 92 mmol/L — ABNORMAL LOW (ref 98–111)
Creatinine, Ser: 0.44 mg/dL (ref 0.44–1.00)
GFR calc Af Amer: >60 mL/min (ref >60)
GFR calc Af Amer: >60 mL/min (ref >60)
GLUCOSE: 102 mg/dL — AB (ref 70–99)
GLUCOSE: 108 mg/dL — AB (ref 70–99)
POTASSIUM: 3.5 mmol/L (ref 3.5–5.1)
Potassium: 3.6 mmol/L (ref 3.5–5.1)
SODIUM: 132 mmol/L — AB (ref 135–145)
Sodium: 130 mmol/L — ABNORMAL LOW (ref 135–145)

## 2018-07-08 LAB — PROCALCITONIN: PROCALCITONIN: 0.36 ng/mL

## 2018-07-08 LAB — MAGNESIUM: MAGNESIUM: 1.9 mg/dL (ref 1.7–2.4)

## 2018-07-08 LAB — BRAIN NATRIURETIC PEPTIDE: B NATRIURETIC PEPTIDE 5: 27.4 pg/mL (ref 0.0–100.0)

## 2018-07-08 MED ORDER — IPRATROPIUM-ALBUTEROL 0.5-2.5 (3) MG/3ML IN SOLN
3.0000 mL | Freq: Two times a day (BID) | RESPIRATORY_TRACT | Status: DC
  Administered 2018-07-08 – 2018-07-10 (×4): 3 mL via RESPIRATORY_TRACT
  Filled 2018-07-08 (×4): qty 3

## 2018-07-08 MED ORDER — ALPHA1-PROTEINASE INHIBITOR 1000 MG/20ML IV SOLN
60.0000 mg/kg | INTRAVENOUS | Status: DC
  Administered 2018-07-09: 4350 mg via INTRAVENOUS

## 2018-07-08 MED ORDER — IPRATROPIUM-ALBUTEROL 0.5-2.5 (3) MG/3ML IN SOLN
3.0000 mL | Freq: Four times a day (QID) | RESPIRATORY_TRACT | Status: DC
  Administered 2018-07-08: 3 mL via RESPIRATORY_TRACT

## 2018-07-08 MED ORDER — ENSURE ENLIVE PO LIQD
237.0000 mL | Freq: Two times a day (BID) | ORAL | Status: DC
  Administered 2018-07-08 – 2018-07-09 (×2): 237 mL via ORAL

## 2018-07-08 NOTE — Progress Notes (Signed)
Plan for bronchoscopy on 8/21 at 11am per Dr. Ander Slade.  NPO after MN on 8/21.    Noe Gens, NP-C [place] [facility] Pgr: [phone] or if no answer [phone] 07/08/2018, 9:29 AM

## 2018-07-08 NOTE — Progress Notes (Signed)
[facility] Progress Note  Patient: [name] WER:[id number]   PCP: [name], MD DOB: 05-04-1956   DOA: 07/06/2018   DOS: 07/08/2018   Date of Service: the patient was seen and examined on 07/08/2018  Subjective: Continues to have weakness, more shortness of breath.  More cough today.  No chest pain.  Brief hospital course: Pt. with PMH of known antitrypsin deficiency; admitted on 07/06/2018, presented with complaint of abnormal lab, was found to have hyponatremia and lung mass. Currently further plan is continue close monitoring.  Assessment and Plan: 1.  Hypoosmolar hypovolemic hyponatremia. Responding to IV fluid as well as fluid restriction and salt tablets. Sodium has risen from 116 to 133 in less than 24-hour. 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 Patient does not have any focal deficit on exam for concern for CNS derangements from rapid correction of sodium. For today we will continue to monitor every 4 hours neuro check. Discontinue normal saline to LR. Discussed with nephrology, discontinue tolvaptan, discontinue salt tablets.  Also discontinue free water restriction. Currently patient is on amitriptyline, Prozac which can also lead to hyponatremia therefore we will discontinue that for now. Also holding HCTZ.  Patient has poor p.o. intake prior to admission but currently appears to be tolerating oral diet. Transfer out of stepdown unit.  2.  Left lung mass. Pulmonary consulted, will need bronchoscopy and biopsy.  3.  Alpha-1 antitrypsin deficiency. Patient is due for her weekly injection on Monday.  Will discuss with pulmonary regarding the dose.  4.  Hypokalemia, hypochloremia. Improved.  Monitor.  5.  Hypertension. Orthostatic hypotension. Holding antihypertensive medication for now. Continue IV fluids.  6.  Bilateral rhonchi. Add DuoNeb's.  Follow pulmonary recommendation.  Diet: cardiac  diet DVT Prophylaxis: subcutaneous Heparin  Advance goals of care discussion: full code  Family Communication: family was present at bedside, at the time of interview. The pt provided permission to discuss medical plan with the family. Opportunity was given to ask question and all questions were answered satisfactorily.   Disposition:  Discharge to home.  Consultants: PCCM  Procedures: none  Antibiotics: Anti-infectives (From admission, onward)   None       Objective: Physical Exam: Vitals:   07/08/18 0708 07/08/18 0800 07/08/18 1103 07/08/18 1146  BP:  (!) 158/76 119/70   Pulse:  79 99   Resp:  15 12   Temp: 97.8 F (36.6 C)  98.4 F (36.9 C)   TempSrc: Oral  Oral   SpO2:  97% 95%   Weight:    72.5 kg  Height:    5\' 5"  (1.651 m)    Intake/Output Summary (Last 24 hours) at 07/08/2018 1817 Last data filed at 07/08/2018 1710 Gross per 24 hour  Intake 1730.07 ml  Output 1100 ml  Net 630.07 ml   Filed Weights   07/07/18 0033 07/07/18 0100 07/08/18 1146  Weight: 77.8 kg 77.8 kg 72.5 kg   General: Alert, Awake and Oriented to Time, Place and Person. Appear in mild distress, affect appropriate Eyes: PERRL, Conjunctiva normal ENT: Oral Mucosa clear moist. Neck: no JVD, no Abnormal Mass Or lumps Cardiovascular: S1 and S2 Present, no Murmur, Peripheral Pulses Present Respiratory: normal respiratory effort, Bilateral Air entry equal and Decreased, no use of accessory muscle, Clear to Auscultation, no Crackles, no wheezes Abdomen: Bowel Sound present, Soft and no tenderness, no hernia Skin: no redness, no Rash, no induration Extremities: no Pedal edema, no calf  tenderness Neurologic: Grossly no focal neuro deficit. Bilaterally Equal motor strength  Data Reviewed: CBC: Recent Labs  Lab 07/05/18 1546 07/06/18 1310 07/08/18 0031  WBC 8.0 8.4 8.0  NEUTROABS 5.2 5.6  --   HGB 14.4 14.1 13.1  HCT 41.3 39.4 37.8  MCV 82 81.7 84.2  PLT 438 375 062   Basic Metabolic  Panel: Recent Labs  Lab 07/06/18 1310  07/07/18 1235 07/07/18 1619 07/07/18 2046 07/08/18 0031 07/08/18 0839  NA 116*   < > 132* 132* 132* 132* 130*  K 2.4*   < > 3.4* 3.5 3.4* 3.5 3.6  CL 77*   < > 96* 95* 95* 94* 92*  CO2 29   < > 27 28 27 29 29   GLUCOSE 112*   < > 114* 100* 104* 108* 102*  BUN 5*   < > 5* 5* 6 7 5*  CREATININE 0.44   < > 0.46 0.47 0.49 0.44 0.40*  CALCIUM 8.6*   < > 8.8* 8.8* 9.0 9.1 8.9  MG 1.8  --   --   --   --  1.9  --    < > = values in this interval not displayed.    Liver Function Tests: Recent Labs  Lab 07/05/18 1546 07/06/18 1310 07/07/18 0922  AST 92* 82* 81*  ALT 46* 41 42  ALKPHOS 312* 246* 248*  BILITOT 0.6 1.1 0.6  PROT 7.1 7.4 7.5  ALBUMIN 4.0 3.4* 3.2*   Recent Labs  Lab 07/06/18 1310  LIPASE 41   No results for input(s): AMMONIA in the last 168 hours. Coagulation Profile: Recent Labs  Lab 07/06/18 1310  INR 1.03   Cardiac Enzymes: Recent Labs  Lab 07/06/18 1310  TROPONINI <0.03   BNP (last 3 results) No results for input(s): PROBNP in the last 8760 hours. CBG: No results for input(s): GLUCAP in the last 168 hours. Studies: Dg Chest Port 1 View  Result Date: 07/08/2018 CLINICAL DATA:  Rhonchi. EXAM: PORTABLE CHEST 1 VIEW COMPARISON:  None. FINDINGS: The heart size and mediastinal contours are within normal limits. No pneumothorax or pleural effusion is noted. Right lung is clear. Mild left basilar subsegmental atelectasis is noted. The visualized skeletal structures are unremarkable. IMPRESSION: Mild left basilar subsegmental atelectasis. Electronically Signed   By: Marijo Conception, M.D.   On: 07/08/2018 07:22    Scheduled Meds: . alpha-1-proteinase inhibitor (human)  60 mg/kg Intravenous Weekly  . benzonatate  100 mg Oral TID  . enoxaparin (LOVENOX) injection  40 mg Subcutaneous Q24H  . ezetimibe  10 mg Oral Daily  . feeding supplement (ENSURE ENLIVE)  237 mL Oral BID BM  . Glycopyrrolate-Formoterol  2 puff  Inhalation BID  . guaiFENesin  600 mg Oral BID  . HYDROcodone-acetaminophen  1 tablet Oral TID  . ipratropium-albuterol  3 mL Nebulization BID  . mouth rinse  15 mL Mouth Rinse BID  . pantoprazole  40 mg Oral Daily  . potassium chloride  40 mEq Oral BID  . rosuvastatin  20 mg Oral q1800   Continuous Infusions:  PRN Meds: acetaminophen **OR** acetaminophen, albuterol, guaiFENesin-dextromethorphan, meclizine, ondansetron **OR** ondansetron (ZOFRAN) IV  Time spent: The patient is critically ill with multiple organ systems failure and requires high complexity decision making for assessment and support, frequent evaluation and titration of therapies. Critical Care Time devoted to patient care services described in this note is 35 minutes   Author: Berle Mull, MD [facility] Pager: [phone] 07/08/2018 6:17 PM  If 7PM-7AM, please contact night-coverage at [email or web address], password [facility]

## 2018-07-08 NOTE — Care Management Note (Signed)
Case Management Note  Patient Details  Name: [name] MRN: [id number] Date of Birth: September 09, 1956  Subjective/Objective:                  75 yowf quit smoking around 2013 with documented ZZ and mild/moderate airflow obst with last pfts 12/13/17 FEV1/VC=   1.43/2.06 p 13% resp to saba = ratio 69% (so actually barely meets the classic criteria for copd if fvc is used instead of VC) with FTT x 6 weeks = insidious onset gradually worse gen weakness/ anorexia, n and v with mild cough> mucoid only drinking bottled water by the gallon and admitted with Na 116 with LLL atx assoc with L hilar mass on CT chest and pccm service asked to see. Denies ha or neuro symptoms No obvious day to day or daytime variability or assoc excess/ purulent sputum or mucus plugs or hemoptysis or cp or chest tightness, subjective wheeze or overt sinus or hb symptoms.  Sleeping on 2 pillows  without nocturnal  or early am exacerbation  of respiratory  c/o's or need for noct saba. Also denies any obvious fluctuation of symptoms with weather or environmental changes or other aggravating or alleviating factors except as outlined above  No unusual exposure hx or h/o childhood pna/ asthma or knowledge of premature birth.  Action/Plan: Will follow for progression and cm needs being transfered from sdu to telemetry.  Expected Discharge Date:                  Expected Discharge Plan:  Home/Self Care  In-House Referral:     Discharge planning Services  CM Consult  Post Acute Care Choice:    Choice offered to:     DME Arranged:    DME Agency:     HH Arranged:    HH Agency:     Status of Service:  In process, will continue to follow  If discussed at Long Length of Stay Meetings, dates discussed:    Additional Comments:  Leeroy Cha, RN 07/08/2018, 10:12 AM

## 2018-07-08 NOTE — Progress Notes (Signed)
Initial Nutrition Assessment  DOCUMENTATION CODES:   Non-severe (moderate) malnutrition in context of acute illness/injury  INTERVENTION:   - Ensure Enlive po BID, each supplement provides 350 kcal and 20 grams of protein (vanilla flavor)  - Recommend MVI with minerals daily  NUTRITION DIAGNOSIS:   Moderate Malnutrition related to acute illness, decreased appetite (taste changes related to medications) as evidenced by mild fat depletion, mild muscle depletion, percent weight loss (10.4% weight loss in 1 month).  GOAL:   Patient will meet greater than or equal to 90% of their needs  MONITOR:   PO intake, Supplement acceptance, Weight trends, I & O's, Labs  REASON FOR ASSESSMENT:   Malnutrition Screening Tool    ASSESSMENT:   62 year old female who presented to the ED with elevated LFT's. PMH significant for alpha-1 antitrypsin deficiency, COPD, fibromyalgia, GERD, IBS, hyperlipidemia, tobacco use, and hypertension.  8/17 - CT of the chest showed L lung mass  Discussed pt with RN. Pt with transfer orders to telemetry.  Spoke with pt and husband at bedside. Pt up in recliner finishing breakfast meal tray. Noted pt consumed approximately 25% of meal tray. Pt states, "I'm not normally a big breakfast eater."  Pt states that she typically consumes 1-2 meals daily. Lunch may include an egg sandwich or chicken and rice soup. Supper may include pinto beans, cabbage, and chicken. Pt states that she drinks water, coffee, and decaf sweet tea throughout the day. Pt shares that her daughter bought her some Ensure to drink at home and that she tried it but found the chocolate flavor to be too sweet. Pt is willing to try the vanilla flavor during admission.  Pt states that her appetite and PO intake have been decreased since "everything had a sweet taste." Pt believes that this has been going on for the past 3-4 weeks and was related to an antibiotic medication she was taking.  Pt shares  that her "tastes are coming back" and that the foods on her breakfast meal tray "taste like they're supposed to."  Pt endorses recent weight loss, stating that her UBW is 189 lbs and that she last weighed this on 06/03/18 at a doctor's appointment. Pt shares that she has lost 20 lbs since that date. Per weight history in chart, pt has lost 19.8 lbs since 06/03/18. This is a 10.4% weight loss in 1 month which is significant for timeframe.  Meal Completion: 50%  Medications reviewed and include: 40 mg Protonix daily, 40 mEq K-dur BID  Labs reviewed: sodium 130 (L), chloride 92 (L), BUN 5 (L), creatinine 0.40 (L)  UOP: 2300 ml x 24 hours  NUTRITION - FOCUSED PHYSICAL EXAM:    Most Recent Value  Orbital Region  Mild depletion  Upper Arm Region  Mild depletion  Thoracic and Lumbar Region  Unable to assess  Buccal Region  Mild depletion  Temple Region  Mild depletion  Clavicle Bone Region  Mild depletion  Clavicle and Acromion Bone Region  Mild depletion  Scapular Bone Region  Mild depletion  Dorsal Hand  No depletion  Patellar Region  Mild depletion  Anterior Thigh Region  Mild depletion  Posterior Calf Region  No depletion  Edema (RD Assessment)  None  Hair  Reviewed  Eyes  Reviewed  Mouth  Reviewed  Skin  Reviewed  Nails  Reviewed       Diet Order:   Diet Order            Diet NPO time specified  Diet effective now        Diet Heart Room service appropriate? Yes; Fluid consistency: Thin; Fluid restriction: 1500 mL Fluid  Diet effective now              EDUCATION NEEDS:   Education needs have been addressed  Skin:  Skin Assessment: Reviewed RN Assessment  Last BM:  07/06/18  Height:   Ht Readings from Last 1 Encounters:  07/07/18 5\' 5"  (1.651 m)    Weight:   Wt Readings from Last 1 Encounters:  07/07/18 77.8 kg    Ideal Body Weight:  56.82 kg  BMI:  Body mass index is 28.54 kg/m.  Estimated Nutritional Needs:   Kcal:  1900-2100  Protein:  95-110  grams  Fluid:  1.9-2.1 L    Gaynell Face, MS, RD, LDN Pager: [phone] Weekend/After Hours: [phone]

## 2018-07-08 NOTE — Progress Notes (Signed)
PULMONARY / CRITICAL CARE MEDICINE   Name: [name] MRN: [id number] DOB: [birth year]    ADMISSION DATE:  07/06/2018 CONSULTATION DATE:   07/07/2018  REFERRING MD: [facility]  CHIEF COMPLAINT: Lung mass  HISTORY OF PRESENT ILLNESS:   73 yowf quit smoking around 2013 with documented ZZ and mild/moderate airflow obst with last pfts 12/13/17 FEV1/VC=   1.43/2.06 p 13% resp to saba = ratio 69% (so actually barely meets the classic criteria for copd if fvc is used instead of VC) with FTT x 6 weeks = insidious onset gradually worse gen weakness/ anorexia, n and v with mild cough> mucoid only drinking bottled water by the gallon and admitted with Na 116 with LLL atx assoc with L hilar mass on CT chest and pccm service asked to see. Denies ha or neuro symptoms  No obvious day to day or daytime variability or assoc excess/ purulent sputum or mucus plugs or hemoptysis or cp or chest tightness, subjective wheeze or overt sinus or hb symptoms.   Sleeping on 2 pillows  without nocturnal  or early am exacerbation  of respiratory  c/o's or need for noct saba. Also denies any obvious fluctuation of symptoms with weather or environmental changes or other aggravating or alleviating factors except as outlined above   No unusual exposure hx or h/o childhood pna/ asthma or knowledge of premature birth.  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  PAST SURGICAL HISTORY: She  has a past surgical history that includes Lumbar disc surgery and Abdominal  hysterectomy.  Allergies  Allergen Reactions  . Penicillins Nausea And Vomiting    Has patient had a PCN reaction causing immediate rash, facial/tongue/throat swelling, SOB or lightheadedness with hypotension: Unknown Has patient had a PCN reaction causing severe rash involving mucus membranes or skin necrosis: Unknown Has patient had a PCN reaction that required hospitalization: Unknown Has patient had a PCN reaction occurring within the last 10 years: Unknown If all of the above answers are "NO", then may proceed with Cephalosporin use.   Marland Kitchen Actonel [Risedronate Sodium]     Caused her to retain fluid and stomach problems  . Advair Diskus [Fluticasone-Salmeterol]   . Alendronate Sodium     Stomach upset / esophageal burning  . Aspirin Nausea Only    Stomach burns; abdominal pain  . Azithromycin Nausea And Vomiting  . Levofloxacin     Reports hx of GI distress and GI bleed  . Morphine Itching and Swelling    Throat swells  . Nsaids     Swelling, GI upset   . Omnicef [Cefdinir]   . Erythromycin Rash  . Iodine Rash    Topical and IV    No current facility-administered medications on file prior to encounter.    Current Outpatient Medications on File Prior to Encounter  Medication Sig  . albuterol (PROVENTIL) (2.5 MG/3ML) 0.083% nebulizer solution Take 3 mLs (2.5 mg total) by nebulization every 6 (six) hours as needed.  Marland Kitchen amitriptyline (ELAVIL) 75 MG tablet Take 75 mg by mouth at bedtime.    Marland Kitchen BEVESPI AEROSPHERE 9-4.8 MCG/ACT AERO 2 PUFFS 2 TIMES A DAY  . chlorthalidone (HYGROTON) 25 MG tablet TAKE (  1/2) TABLET DAILY.  Marland Kitchen ezetimibe (ZETIA) 10 MG tablet TAKE 1 TABLET ONCE A DAY  . FLUoxetine (PROZAC) 20 MG capsule TAKE (2) CAPSULES DAILY.  . fluticasone (FLONASE) 50 MCG/ACT nasal spray USE 2 SPRAYS IN EACH NOSTRIL ONCE DAILY.  . folic acid (FOLVITE) 1 MG tablet Take 1 mg by mouth daily.  Marland Kitchen guaiFENesin (MUCINEX) 600 MG 12 hr tablet Take 1,200 mg by mouth 2 (two) times daily.    Marland Kitchen  HYDROcodone-acetaminophen (NORCO/VICODIN) 5-325 MG tablet Take 1 tablet by mouth 3 (three) times daily.  . meclizine (ANTIVERT) 12.5 MG tablet Take 1 tablet (12.5 mg total) by mouth 3 (three) times daily as needed for dizziness.  . Nebulizers (COMPRESSOR NEBULIZER) MISC 1 Device by Does not apply route as needed.  Marland Kitchen omeprazole (PRILOSEC) 40 MG capsule TAKE (1) CAPSULE DAILY  . ondansetron (ZOFRAN ODT) 4 MG disintegrating tablet Take 1 tablet (4 mg total) by mouth every 8 (eight) hours as needed for nausea or vomiting.  Marland Kitchen PROAIR HFA 108 (90 Base) MCG/ACT inhaler 2 PUFFS EVERY 4 HOURS AS NEEDED FOR WHEEZING  . rosuvastatin (CRESTOR) 40 MG tablet TAKE 1/2 TABLET ONCE DAILY  . umeclidinium-vilanterol (ANORO ELLIPTA) 62.5-25 MCG/INH AEPB Inhale 1 puff into the lungs daily.    FAMILY HISTORY:  Her family history includes Alpha-1 antitrypsin deficiency in her other and sister; CAD (age of onset: 94) in her brother; CAD (age of onset: 18) in her sister; Heart attack in her brother; Heart attack (age of onset: 32) in her father; Heart disease in her brother; Irregular heart beat in her mother; Leukemia in her brother; Lung cancer in her brother; Migraines in her father; Osteoporosis in her mother, sister, and sister; Pancreatic cancer (age of onset: 29) in her mother. There is no history of Colon cancer.  SOCIAL HISTORY: She  reports that she quit smoking about 6 years ago. Her smoking use included cigarettes. She started smoking about 45 years ago. She smoked 0.50 packs per day. She has never used smokeless tobacco. She reports that she does not drink alcohol or use drugs.  REVIEW OF SYSTEMS:   Review of Systems  Constitutional: Positive for weight loss.  HENT: Negative.   Eyes: Negative.   Respiratory: Positive for cough and shortness of breath.   Cardiovascular: Negative.   Gastrointestinal: Negative.   Genitourinary: Negative.   Musculoskeletal: Negative.   Skin: Negative.   Neurological:  Negative.   Endo/Heme/Allergies: Negative.     SUBJECTIVE:  Overnight feels a little bit better Denies any chest pains or chest discomfort No hemoptysis Breathing feels relatively stable  VITAL SIGNS: BP 119/70 (BP Location: Left Arm)   Pulse 99   Temp 98.4 F (36.9 C) (Oral)   Resp 12   Ht 5\' 5"  (1.651 m)   Wt 77.8 kg   SpO2 95%   BMI 28.54 kg/m   HEMODYNAMICS:    VENTILATOR SETTINGS:    INTAKE / OUTPUT: I/O last 3 completed shifts: In: 1701.3 [P.O.:600; I.V.:994.7; IV Piggyback:106.7] Out: 2300 [Urine:2300]  PHYSICAL EXAMINATION: General: Awake, alert, frail Neuro: Alert and oriented x3, no focal findings HEENT: No oral lesions, moist oral mucosa Cardiovascular: S1-S2 appreciated Lungs: Decreased breath sounds bilaterally Abdomen: Bowel sounds appreciated with no tenderness Musculoskeletal: Moving all extremities Skin: Warm and dry with no lesions  LABS:  BMET Recent Labs  Lab 07/07/18 2046 07/08/18 0031 07/08/18 0839  NA 132* 132* 130*  K 3.4* 3.5 3.6  CL 95* 94* 92*  CO2 27 29  29  BUN 6 7 5*  CREATININE 0.49 0.44 0.40*  GLUCOSE 104* 108* 102*    Electrolytes Recent Labs  Lab 07/06/18 1310  07/07/18 2046 07/08/18 0031 07/08/18 0839  CALCIUM 8.6*   < > 9.0 9.1 8.9  MG 1.8  --   --  1.9  --    < > = values in this interval not displayed.    CBC Recent Labs  Lab 07/05/18 1546 07/06/18 1310 07/08/18 0031  WBC 8.0 8.4 8.0  HGB 14.4 14.1 13.1  HCT 41.3 39.4 37.8  PLT 438 375 371    Coag's Recent Labs  Lab 07/06/18 1310  INR 1.03    Sepsis Markers Recent Labs  Lab 07/06/18 1315 07/08/18 0839  LATICACIDVEN 1.0  --   PROCALCITON  --  0.36    ABG No results for input(s): PHART, PCO2ART, PO2ART in the last 168 hours.  Liver Enzymes Recent Labs  Lab 07/05/18 1546 07/06/18 1310 07/07/18 0922  AST 92* 82* 81*  ALT 46* 41 42  ALKPHOS 312* 246* 248*  BILITOT 0.6 1.1 0.6  ALBUMIN 4.0 3.4* 3.2*    Cardiac  Enzymes Recent Labs  Lab 07/06/18 1310  TROPONINI <0.03    Glucose No results for input(s): GLUCAP in the last 168 hours.  Imaging Dg Chest Port 1 View  Result Date: 07/08/2018 CLINICAL DATA:  Rhonchi. EXAM: PORTABLE CHEST 1 VIEW COMPARISON:  None. FINDINGS: The heart size and mediastinal contours are within normal limits. No pneumothorax or pleural effusion is noted. Right lung is clear. Mild left basilar subsegmental atelectasis is noted. The visualized skeletal structures are unremarkable. IMPRESSION: Mild left basilar subsegmental atelectasis. Electronically Signed   By: Marijo Conception, M.D.   On: 07/08/2018 07:22     STUDIES:  Chest x-ray, CT scan of the chest reviewed with the patient and spouse  ANTIBIOTICS: Not on any antibiotics at present  SIGNIFICANT EVENTS: Clinically feels stable  DISCUSSION: 1.  Left hilar mass, extensive adenopathy  2.  Hyponatremia-likely related to ongoing pulmonary process  3.  Possibly organizing pneumonia, versus metastasis  ASSESSMENT / PLAN:  PULMONARY A: Lung mass Obstructive lung disease P:   Plan for bronchoscopy-tentatively scheduled for Wednesday  CARDIOVASCULAR A:  Stable cardia vascular status P:  Continue to monitor  RENAL A:   Hyponatremia P:   Continue to monitor  GASTROINTESTINAL A:   Stable status P:   Continue to monitor   FAMILY  - Updates: Discussed with spouse at bedside  Likely underlying neoplastic process Further decisions will be made following bronchoscopy Risk and benefit of bronchoscopy discussed with the patient  Pulmonary and [place] Pager: [phone]  07/08/2018, 11:16 AM

## 2018-07-09 LAB — CBC
HCT: 38.5 % (ref 36.0–46.0)
Hemoglobin: 13 g/dL (ref 12.0–15.0)
MCH: 28.6 pg (ref 26.0–34.0)
MCHC: 33.8 g/dL (ref 30.0–36.0)
MCV: 84.6 fL (ref 78.0–100.0)
PLATELETS: 382 10*3/uL (ref 150–400)
RBC: 4.55 MIL/uL (ref 3.87–5.11)
RDW: 16.8 % — ABNORMAL HIGH (ref 11.5–15.5)
WBC: 6.7 10*3/uL (ref 4.0–10.5)

## 2018-07-09 LAB — BASIC METABOLIC PANEL
Anion gap: 9 (ref 5–15)
BUN: 8 mg/dL (ref 6–20)
CALCIUM: 9 mg/dL (ref 8.9–10.3)
CHLORIDE: 92 mmol/L — AB (ref 98–111)
CO2: 32 mmol/L (ref 22–32)
CREATININE: 0.45 mg/dL (ref 0.44–1.00)
Glucose, Bld: 105 mg/dL — ABNORMAL HIGH (ref 70–99)
Potassium: 3.6 mmol/L (ref 3.5–5.1)
SODIUM: 133 mmol/L — AB (ref 135–145)

## 2018-07-09 LAB — PROCALCITONIN: Procalcitonin: 0.42 ng/mL

## 2018-07-09 MED ORDER — DOCUSATE SODIUM 100 MG PO CAPS
100.0000 mg | ORAL_CAPSULE | Freq: Two times a day (BID) | ORAL | Status: DC
  Administered 2018-07-09 (×2): 100 mg via ORAL
  Filled 2018-07-09 (×2): qty 1

## 2018-07-09 MED ORDER — AMITRIPTYLINE HCL 25 MG PO TABS
75.0000 mg | ORAL_TABLET | Freq: Every day | ORAL | Status: DC
  Administered 2018-07-09: 75 mg via ORAL
  Filled 2018-07-09: qty 3

## 2018-07-09 NOTE — Progress Notes (Signed)
PULMONARY / CRITICAL CARE MEDICINE   Name: [name] MRN: [id number] DOB: 07/21/1956    ADMISSION DATE:  07/06/2018 CONSULTATION DATE:   07/07/2018  REFERRING MD: [facility]   CHIEF COMPLAINT: Lung mass  HISTORY OF PRESENT ILLNESS:   6 yowf quit smoking around 2013 with documented ZZ and mild/moderate airflow obst with last pfts 12/13/17 FEV1/VC=   1.43/2.06 p 13% resp to saba = ratio 69% (so actually barely meets the classic criteria for copd if fvc is used instead of VC) with FTT x 6 weeks = insidious onset gradually worse gen weakness/ anorexia, n and v with mild cough> mucoid only drinking bottled water by the gallon and admitted with Na 116 with LLL atx assoc with L hilar mass on CT chest and pccm service asked to see. Denies ha or neuro symptoms  No obvious day to day or daytime variability or assoc excess/ purulent sputum or mucus plugs or hemoptysis or cp or chest tightness, subjective wheeze or overt sinus or hb symptoms.   Sleeping on 2 pillows  without nocturnal  or early am exacerbation  of respiratory  c/o's or need for noct saba. Also denies any obvious fluctuation of symptoms with weather or environmental changes or other aggravating or alleviating factors except as outlined above   No unusual exposure hx or h/o childhood pna/ asthma or knowledge of premature birth.  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  PAST SURGICAL HISTORY: She  has a past surgical history that includes Lumbar disc surgery and Abdominal  hysterectomy.  Allergies  Allergen Reactions  . Penicillins Nausea And Vomiting    Has patient had a PCN reaction causing immediate rash, facial/tongue/throat swelling, SOB or lightheadedness with hypotension: Unknown Has patient had a PCN reaction causing severe rash involving mucus membranes or skin necrosis: Unknown Has patient had a PCN reaction that required hospitalization: Unknown Has patient had a PCN reaction occurring within the last 10 years: Unknown If all of the above answers are "NO", then may proceed with Cephalosporin use.   Marland Kitchen Actonel [Risedronate Sodium]     Caused her to retain fluid and stomach problems  . Advair Diskus [Fluticasone-Salmeterol]   . Alendronate Sodium     Stomach upset / esophageal burning  . Aspirin Nausea Only    Stomach burns; abdominal pain  . Azithromycin Nausea And Vomiting  . Levofloxacin     Reports hx of GI distress and GI bleed  . Morphine Itching and Swelling    Throat swells  . Nsaids     Swelling, GI upset   . Omnicef [Cefdinir]   . Erythromycin Rash  . Iodine Rash    Topical and IV    No current facility-administered medications on file prior to encounter.    Current Outpatient Medications on File Prior to Encounter  Medication Sig  . albuterol (PROVENTIL) (2.5 MG/3ML) 0.083% nebulizer solution Take 3 mLs (2.5 mg total) by nebulization every 6 (six) hours as needed.  Marland Kitchen amitriptyline (ELAVIL) 75 MG tablet Take 75 mg by mouth at bedtime.    Marland Kitchen BEVESPI AEROSPHERE 9-4.8 MCG/ACT AERO 2 PUFFS 2 TIMES A DAY  . chlorthalidone (HYGROTON) 25 MG tablet  TAKE (1/2) TABLET DAILY.  Marland Kitchen ezetimibe (ZETIA) 10 MG tablet TAKE 1 TABLET ONCE A DAY  . FLUoxetine (PROZAC) 20 MG capsule TAKE (2) CAPSULES DAILY.  . fluticasone (FLONASE) 50 MCG/ACT nasal spray USE 2 SPRAYS IN EACH NOSTRIL ONCE DAILY.  . folic acid (FOLVITE) 1 MG tablet Take 1 mg by mouth daily.  Marland Kitchen guaiFENesin (MUCINEX) 600 MG 12 hr tablet Take 1,200 mg by mouth 2 (two) times daily.    Marland Kitchen  HYDROcodone-acetaminophen (NORCO/VICODIN) 5-325 MG tablet Take 1 tablet by mouth 3 (three) times daily.  . meclizine (ANTIVERT) 12.5 MG tablet Take 1 tablet (12.5 mg total) by mouth 3 (three) times daily as needed for dizziness.  . Nebulizers (COMPRESSOR NEBULIZER) MISC 1 Device by Does not apply route as needed.  Marland Kitchen omeprazole (PRILOSEC) 40 MG capsule TAKE (1) CAPSULE DAILY  . ondansetron (ZOFRAN ODT) 4 MG disintegrating tablet Take 1 tablet (4 mg total) by mouth every 8 (eight) hours as needed for nausea or vomiting.  Marland Kitchen PROAIR HFA 108 (90 Base) MCG/ACT inhaler 2 PUFFS EVERY 4 HOURS AS NEEDED FOR WHEEZING  . rosuvastatin (CRESTOR) 40 MG tablet TAKE 1/2 TABLET ONCE DAILY  . umeclidinium-vilanterol (ANORO ELLIPTA) 62.5-25 MCG/INH AEPB Inhale 1 puff into the lungs daily.    FAMILY HISTORY:  Her family history includes Alpha-1 antitrypsin deficiency in her other and sister; CAD (age of onset: 18) in her brother; CAD (age of onset: 24) in her sister; Heart attack in her brother; Heart attack (age of onset: 68) in her father; Heart disease in her brother; Irregular heart beat in her mother; Leukemia in her brother; Lung cancer in her brother; Migraines in her father; Osteoporosis in her mother, sister, and sister; Pancreatic cancer (age of onset: 30) in her mother. There is no history of Colon cancer.  SOCIAL HISTORY: She  reports that she quit smoking about 6 years ago. Her smoking use included cigarettes. She started smoking about 45 years ago. She smoked 0.50 packs per day. She has never used smokeless tobacco. She reports that she does not drink alcohol or use drugs.  REVIEW OF SYSTEMS:   Review of Systems  Constitutional: Positive for weight loss.  HENT: Negative.   Eyes: Negative.   Respiratory: Positive for cough (Cough is improving) and shortness of breath (Shortness of breath is better).   Cardiovascular: Negative.   Gastrointestinal: Negative.   Genitourinary: Negative.    Musculoskeletal: Negative.   Skin: Negative.   Neurological: Negative.   Endo/Heme/Allergies: Negative.     SUBJECTIVE:  Feels better overall Still has a cough but improving Denies any hemoptysis Bronchoscopy discussed  VITAL SIGNS: BP 119/67 (BP Location: Right Arm)   Pulse 79   Temp 97.9 F (36.6 C) (Oral)   Resp 18   Ht 5\' 5"  (1.651 m)   Wt 72.5 kg   SpO2 (!) 86%   BMI 26.59 kg/m    INTAKE / OUTPUT: I/O last 3 completed shifts: In: 1970.1 [P.O.:1320; I.V.:650.1] Out: 1100 [Urine:1100]  PHYSICAL EXAMINATION: General: Alert, frail Neuro: No focal findings HEENT: Moist oral mucosa  cardiovascular: S1-S2 appreciated Lungs: Decreased breath sounds bilaterally Abdomen: Bowel sounds appreciated Musculoskeletal: Moving all extremities Skin: Warm and dry  LABS:  BMET Recent Labs  Lab 07/08/18 0031 07/08/18 0839 07/09/18 0551  NA 132* 130* 133*  K 3.5 3.6 3.6  CL 94* 92* 92*  CO2 29 29 32  BUN 7 5* 8  CREATININE 0.44 0.40* 0.45  GLUCOSE 108* 102* 105*  Electrolytes Recent Labs  Lab 07/06/18 1310  07/08/18 0031 07/08/18 0839 07/09/18 0551  CALCIUM 8.6*   < > 9.1 8.9 9.0  MG 1.8  --  1.9  --   --    < > = values in this interval not displayed.    CBC Recent Labs  Lab 07/06/18 1310 07/08/18 0031 07/09/18 0551  WBC 8.4 8.0 6.7  HGB 14.1 13.1 13.0  HCT 39.4 37.8 38.5  PLT 375 371 382    Coag's Recent Labs  Lab 07/06/18 1310  INR 1.03    Sepsis Markers Recent Labs  Lab 07/06/18 1315 07/08/18 0839 07/09/18 0551  LATICACIDVEN 1.0  --   --   PROCALCITON  --  0.36 0.42    Liver Enzymes Recent Labs  Lab 07/05/18 1546 07/06/18 1310 07/07/18 0922  AST 92* 82* 81*  ALT 46* 41 42  ALKPHOS 312* 246* 248*  BILITOT 0.6 1.1 0.6  ALBUMIN 4.0 3.4* 3.2*    Cardiac Enzymes Recent Labs  Lab 07/06/18 1310  TROPONINI <0.03    STUDIES:  Chest x-ray, CT scan of the chest reviewed   ANTIBIOTICS: Not on any antibiotics at  present  SIGNIFICANT EVENTS: Clinically feels stable  DISCUSSION:  1.  Left hilar mass with extensive adenopathy  2.  Organizing pneumonia versus metastatic disease  . 3.  Hyponatremia likely related to her neoplastic process  ASSESSMENT / PLAN:  PULMONARY A: Large lung mass, postobstructive atelectasis Underlying obstructive lung disease P:   Bronchoscopy scheduled for tomorrow Patient's questions were answered regarding bronchoscopy  CARDIOVASCULAR A:  Stable cardiac status P:  Continue to monitor hemodynamics  RENAL A:   Hyponatremia P:   Continue to monitor  Bronchoscopy is scheduled for the a.m. Discussions with patient with regards to procedure and expectations  Pulmonary and [place] Pager: [phone]  07/09/2018, 1:06 PM

## 2018-07-09 NOTE — Progress Notes (Signed)
[facility] Progress Note  Patient: [name] UJW:[id number]   PCP: [name], MD DOB: 1956/03/14   DOA: 07/06/2018   DOS: 07/09/2018   Date of Service: the patient was seen and examined on 07/09/2018  Subjective: Feeling better, no nausea no vomiting.  Cough is better.   Brief hospital course: Pt. with PMH of known antitrypsin deficiency; admitted on 07/06/2018, presented with complaint of abnormal lab, was found to have hyponatremia and lung mass. Currently further plan is continue close monitoring.  Assessment and Plan: 1.  Hypoosmolar hypovolemic hyponatremia. Responding to IV fluid as well as fluid restriction and salt tablets. Sodium has risen from 116 to 133 in less than 24-hour. 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 Patient does not have any focal deficit on exam for concern for CNS derangements from rapid correction of sodium. For today we will continue to monitor every 4 hours neuro check. Discontinue normal saline to LR. Discussed with nephrology, discontinue tolvaptan, discontinue salt tablets.  Also discontinue free water restriction. patient is on amitriptyline, Prozac which can also lead to hyponatremia Resuming amitriptyline, holding Prozac.  Per patient this was primarily started for fibromyalgia. Also holding HCTZ.  Patient has poor p.o. intake prior to admission but currently appears to be tolerating oral diet.  2.  Left lung mass. Pulmonary consulted, will need bronchoscopy and biopsy.  3.  Alpha-1 antitrypsin deficiency. Patient is due for her weekly injection on Monday.  Will discuss with pulmonary regarding the dose.  4.  Hypokalemia, hypochloremia. Improved.  Monitor.  5.  Hypertension. Orthostatic hypotension. Holding antihypertensive medication for now. Continue IV fluids.  6.  Bilateral rhonchi. Add DuoNeb's.  Follow pulmonary recommendation.  Diet: cardiac diet DVT Prophylaxis:  subcutaneous Heparin  Advance goals of care discussion: full code  Family Communication: no family was present at bedside, at the time of interview.   Disposition:  Discharge to home.  PT consulted  Consultants: PCCM  Procedures: none  Antibiotics: Anti-infectives (From admission, onward)   None       Objective: Physical Exam: Vitals:   07/08/18 2130 07/09/18 0509 07/09/18 0854 07/09/18 1414  BP:  119/67  111/84  Pulse:  79  (!) 102  Resp:  18  16  Temp:  97.9 F (36.6 C)  98.1 F (36.7 C)  TempSrc:  Oral  Oral  SpO2: 94% 96% (!) 86% 96%  Weight:      Height:        Intake/Output Summary (Last 24 hours) at 07/09/2018 1618 Last data filed at 07/09/2018 1308 Gross per 24 hour  Intake 1400 ml  Output -  Net 1400 ml   Filed Weights   07/07/18 0033 07/07/18 0100 07/08/18 1146  Weight: 77.8 kg 77.8 kg 72.5 kg   General: Alert, Awake and Oriented to Time, Place and Person. Appear in mild distress, affect appropriate Eyes: PERRL, Conjunctiva normal ENT: Oral Mucosa clear moist. Neck: no JVD, no Abnormal Mass Or lumps Cardiovascular: S1 and S2 Present, no Murmur, Peripheral Pulses Present Respiratory: normal respiratory effort, Bilateral Air entry equal and Decreased, no use of accessory muscle, Clear to Auscultation, no Crackles, no wheezes Abdomen: Bowel Sound present, Soft and no tenderness, no hernia Skin: no redness, no Rash, no induration Extremities: no Pedal edema, no calf tenderness Neurologic: Grossly no focal neuro deficit. Bilaterally Equal motor strength  Data Reviewed: CBC: Recent Labs  Lab 07/05/18 1546 07/06/18 1310 07/08/18 0031 07/09/18 5621  WBC 8.0 8.4 8.0 6.7  NEUTROABS 5.2 5.6  --   --   HGB 14.4 14.1 13.1 13.0  HCT 41.3 39.4 37.8 38.5  MCV 82 81.7 84.2 84.6  PLT 438 375 371 101   Basic Metabolic Panel: Recent Labs  Lab 07/06/18 1310  07/07/18 1619 07/07/18 2046 07/08/18 0031 07/08/18 0839 07/09/18 0551  NA 116*   < > 132*  132* 132* 130* 133*  K 2.4*   < > 3.5 3.4* 3.5 3.6 3.6  CL 77*   < > 95* 95* 94* 92* 92*  CO2 29   < > 28 27 29 29  32  GLUCOSE 112*   < > 100* 104* 108* 102* 105*  BUN 5*   < > 5* 6 7 5* 8  CREATININE 0.44   < > 0.47 0.49 0.44 0.40* 0.45  CALCIUM 8.6*   < > 8.8* 9.0 9.1 8.9 9.0  MG 1.8  --   --   --  1.9  --   --    < > = values in this interval not displayed.    Liver Function Tests: Recent Labs  Lab 07/05/18 1546 07/06/18 1310 07/07/18 0922  AST 92* 82* 81*  ALT 46* 41 42  ALKPHOS 312* 246* 248*  BILITOT 0.6 1.1 0.6  PROT 7.1 7.4 7.5  ALBUMIN 4.0 3.4* 3.2*   Recent Labs  Lab 07/06/18 1310  LIPASE 41   No results for input(s): AMMONIA in the last 168 hours. Coagulation Profile: Recent Labs  Lab 07/06/18 1310  INR 1.03   Cardiac Enzymes: Recent Labs  Lab 07/06/18 1310  TROPONINI <0.03   BNP (last 3 results) No results for input(s): PROBNP in the last 8760 hours. CBG: No results for input(s): GLUCAP in the last 168 hours. Studies: No results found.  Scheduled Meds: . alpha-1-proteinase inhibitor (human)  60 mg/kg Intravenous Weekly  . amitriptyline  75 mg Oral QHS  . benzonatate  100 mg Oral TID  . docusate sodium  100 mg Oral BID  . enoxaparin (LOVENOX) injection  40 mg Subcutaneous Q24H  . ezetimibe  10 mg Oral Daily  . feeding supplement (ENSURE ENLIVE)  237 mL Oral BID BM  . Glycopyrrolate-Formoterol  2 puff Inhalation BID  . guaiFENesin  600 mg Oral BID  . HYDROcodone-acetaminophen  1 tablet Oral TID  . ipratropium-albuterol  3 mL Nebulization BID  . mouth rinse  15 mL Mouth Rinse BID  . pantoprazole  40 mg Oral Daily  . potassium chloride  40 mEq Oral BID  . rosuvastatin  20 mg Oral q1800   Continuous Infusions:  PRN Meds: acetaminophen **OR** acetaminophen, albuterol, guaiFENesin-dextromethorphan, meclizine, ondansetron **OR** ondansetron (ZOFRAN) IV  Time spent: The patient is critically ill with multiple organ systems failure and requires  high complexity decision making for assessment and support, frequent evaluation and titration of therapies. Critical Care Time devoted to patient care services described in this note is 35 minutes   Author: Berle Mull, MD [facility] Pager: [phone] 07/09/2018 4:18 PM  If 7PM-7AM, please contact night-coverage at [email or web address], password [facility]

## 2018-07-09 NOTE — H&P (View-Only) (Signed)
PULMONARY / CRITICAL CARE MEDICINE   Name: [name] MRN: [id number] DOB: June 24, 1956    ADMISSION DATE:  07/06/2018 CONSULTATION DATE:   07/07/2018  REFERRING MD: [facility]   CHIEF COMPLAINT: Lung mass  HISTORY OF PRESENT ILLNESS:   60 yowf quit smoking around 2013 with documented ZZ and mild/moderate airflow obst with last pfts 12/13/17 FEV1/VC=   1.43/2.06 p 13% resp to saba = ratio 69% (so actually barely meets the classic criteria for copd if fvc is used instead of VC) with FTT x 6 weeks = insidious onset gradually worse gen weakness/ anorexia, n and v with mild cough> mucoid only drinking bottled water by the gallon and admitted with Na 116 with LLL atx assoc with L hilar mass on CT chest and pccm service asked to see. Denies ha or neuro symptoms  No obvious day to day or daytime variability or assoc excess/ purulent sputum or mucus plugs or hemoptysis or cp or chest tightness, subjective wheeze or overt sinus or hb symptoms.   Sleeping on 2 pillows  without nocturnal  or early am exacerbation  of respiratory  c/o's or need for noct saba. Also denies any obvious fluctuation of symptoms with weather or environmental changes or other aggravating or alleviating factors except as outlined above   No unusual exposure hx or h/o childhood pna/ asthma or knowledge of premature birth.  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  PAST SURGICAL HISTORY: She  has a past surgical history that includes Lumbar disc surgery and Abdominal  hysterectomy.  Allergies  Allergen Reactions  . Penicillins Nausea And Vomiting    Has patient had a PCN reaction causing immediate rash, facial/tongue/throat swelling, SOB or lightheadedness with hypotension: Unknown Has patient had a PCN reaction causing severe rash involving mucus membranes or skin necrosis: Unknown Has patient had a PCN reaction that required hospitalization: Unknown Has patient had a PCN reaction occurring within the last 10 years: Unknown If all of the above answers are "NO", then may proceed with Cephalosporin use.   Marland Kitchen Actonel [Risedronate Sodium]     Caused her to retain fluid and stomach problems  . Advair Diskus [Fluticasone-Salmeterol]   . Alendronate Sodium     Stomach upset / esophageal burning  . Aspirin Nausea Only    Stomach burns; abdominal pain  . Azithromycin Nausea And Vomiting  . Levofloxacin     Reports hx of GI distress and GI bleed  . Morphine Itching and Swelling    Throat swells  . Nsaids     Swelling, GI upset   . Omnicef [Cefdinir]   . Erythromycin Rash  . Iodine Rash    Topical and IV    No current facility-administered medications on file prior to encounter.    Current Outpatient Medications on File Prior to Encounter  Medication Sig  . albuterol (PROVENTIL) (2.5 MG/3ML) 0.083% nebulizer solution Take 3 mLs (2.5 mg total) by nebulization every 6 (six) hours as needed.  Marland Kitchen amitriptyline (ELAVIL) 75 MG tablet Take 75 mg by mouth at bedtime.    Marland Kitchen BEVESPI AEROSPHERE 9-4.8 MCG/ACT AERO 2 PUFFS 2 TIMES A DAY  . chlorthalidone (HYGROTON) 25 MG tablet  TAKE (1/2) TABLET DAILY.  Marland Kitchen ezetimibe (ZETIA) 10 MG tablet TAKE 1 TABLET ONCE A DAY  . FLUoxetine (PROZAC) 20 MG capsule TAKE (2) CAPSULES DAILY.  . fluticasone (FLONASE) 50 MCG/ACT nasal spray USE 2 SPRAYS IN EACH NOSTRIL ONCE DAILY.  . folic acid (FOLVITE) 1 MG tablet Take 1 mg by mouth daily.  Marland Kitchen guaiFENesin (MUCINEX) 600 MG 12 hr tablet Take 1,200 mg by mouth 2 (two) times daily.    Marland Kitchen  HYDROcodone-acetaminophen (NORCO/VICODIN) 5-325 MG tablet Take 1 tablet by mouth 3 (three) times daily.  . meclizine (ANTIVERT) 12.5 MG tablet Take 1 tablet (12.5 mg total) by mouth 3 (three) times daily as needed for dizziness.  . Nebulizers (COMPRESSOR NEBULIZER) MISC 1 Device by Does not apply route as needed.  Marland Kitchen omeprazole (PRILOSEC) 40 MG capsule TAKE (1) CAPSULE DAILY  . ondansetron (ZOFRAN ODT) 4 MG disintegrating tablet Take 1 tablet (4 mg total) by mouth every 8 (eight) hours as needed for nausea or vomiting.  Marland Kitchen PROAIR HFA 108 (90 Base) MCG/ACT inhaler 2 PUFFS EVERY 4 HOURS AS NEEDED FOR WHEEZING  . rosuvastatin (CRESTOR) 40 MG tablet TAKE 1/2 TABLET ONCE DAILY  . umeclidinium-vilanterol (ANORO ELLIPTA) 62.5-25 MCG/INH AEPB Inhale 1 puff into the lungs daily.    FAMILY HISTORY:  Her family history includes Alpha-1 antitrypsin deficiency in her other and sister; CAD (age of onset: 9) in her brother; CAD (age of onset: 52) in her sister; Heart attack in her brother; Heart attack (age of onset: 25) in her father; Heart disease in her brother; Irregular heart beat in her mother; Leukemia in her brother; Lung cancer in her brother; Migraines in her father; Osteoporosis in her mother, sister, and sister; Pancreatic cancer (age of onset: 62) in her mother. There is no history of Colon cancer.  SOCIAL HISTORY: She  reports that she quit smoking about 6 years ago. Her smoking use included cigarettes. She started smoking about 45 years ago. She smoked 0.50 packs per day. She has never used smokeless tobacco. She reports that she does not drink alcohol or use drugs.  REVIEW OF SYSTEMS:   Review of Systems  Constitutional: Positive for weight loss.  HENT: Negative.   Eyes: Negative.   Respiratory: Positive for cough (Cough is improving) and shortness of breath (Shortness of breath is better).   Cardiovascular: Negative.   Gastrointestinal: Negative.   Genitourinary: Negative.    Musculoskeletal: Negative.   Skin: Negative.   Neurological: Negative.   Endo/Heme/Allergies: Negative.     SUBJECTIVE:  Feels better overall Still has a cough but improving Denies any hemoptysis Bronchoscopy discussed  VITAL SIGNS: BP 119/67 (BP Location: Right Arm)   Pulse 79   Temp 97.9 F (36.6 C) (Oral)   Resp 18   Ht 5\' 5"  (1.651 m)   Wt 72.5 kg   SpO2 (!) 86%   BMI 26.59 kg/m    INTAKE / OUTPUT: I/O last 3 completed shifts: In: 1970.1 [P.O.:1320; I.V.:650.1] Out: 1100 [Urine:1100]  PHYSICAL EXAMINATION: General: Alert, frail Neuro: No focal findings HEENT: Moist oral mucosa  cardiovascular: S1-S2 appreciated Lungs: Decreased breath sounds bilaterally Abdomen: Bowel sounds appreciated Musculoskeletal: Moving all extremities Skin: Warm and dry  LABS:  BMET Recent Labs  Lab 07/08/18 0031 07/08/18 0839 07/09/18 0551  NA 132* 130* 133*  K 3.5 3.6 3.6  CL 94* 92* 92*  CO2 29 29 32  BUN 7 5* 8  CREATININE 0.44 0.40* 0.45  GLUCOSE 108* 102* 105*  Electrolytes Recent Labs  Lab 07/06/18 1310  07/08/18 0031 07/08/18 0839 07/09/18 0551  CALCIUM 8.6*   < > 9.1 8.9 9.0  MG 1.8  --  1.9  --   --    < > = values in this interval not displayed.    CBC Recent Labs  Lab 07/06/18 1310 07/08/18 0031 07/09/18 0551  WBC 8.4 8.0 6.7  HGB 14.1 13.1 13.0  HCT 39.4 37.8 38.5  PLT 375 371 382    Coag's Recent Labs  Lab 07/06/18 1310  INR 1.03    Sepsis Markers Recent Labs  Lab 07/06/18 1315 07/08/18 0839 07/09/18 0551  LATICACIDVEN 1.0  --   --   PROCALCITON  --  0.36 0.42    Liver Enzymes Recent Labs  Lab 07/05/18 1546 07/06/18 1310 07/07/18 0922  AST 92* 82* 81*  ALT 46* 41 42  ALKPHOS 312* 246* 248*  BILITOT 0.6 1.1 0.6  ALBUMIN 4.0 3.4* 3.2*    Cardiac Enzymes Recent Labs  Lab 07/06/18 1310  TROPONINI <0.03    STUDIES:  Chest x-ray, CT scan of the chest reviewed   ANTIBIOTICS: Not on any antibiotics at  present  SIGNIFICANT EVENTS: Clinically feels stable  DISCUSSION:  1.  Left hilar mass with extensive adenopathy  2.  Organizing pneumonia versus metastatic disease  . 3.  Hyponatremia likely related to her neoplastic process  ASSESSMENT / PLAN:  PULMONARY A: Large lung mass, postobstructive atelectasis Underlying obstructive lung disease P:   Bronchoscopy scheduled for tomorrow Patient's questions were answered regarding bronchoscopy  CARDIOVASCULAR A:  Stable cardiac status P:  Continue to monitor hemodynamics  RENAL A:   Hyponatremia P:   Continue to monitor  Bronchoscopy is scheduled for the a.m. Discussions with patient with regards to procedure and expectations  Pulmonary and [place] Pager: [phone]  07/09/2018, 1:06 PM

## 2018-07-09 NOTE — Care Management Important Message (Signed)
Important Message  Patient Details  Name: GABREILLE DARDIS MRN: [id number] Date of Birth: Oct 15, 1956   Medicare Important Message Given:  Yes    Kerin Salen 07/09/2018, 10:24 AMImportant Message  Patient Details  Name: LIS SAVITT MRN: [id number] Date of Birth: September 14, 1956   Medicare Important Message Given:  Yes    Kerin Salen 07/09/2018, 10:23 AM

## 2018-07-10 ENCOUNTER — Encounter (HOSPITAL_COMMUNITY)
Admission: EM | Disposition: A | Discharge status: Home / Self Care | Payer: Self-pay | Source: Home / Self Care | Attending: Internal Medicine

## 2018-07-10 ENCOUNTER — Inpatient Hospital Stay (HOSPITAL_COMMUNITY): Payer: Medicare Other

## 2018-07-10 DIAGNOSIS — E878 Other disorders of electrolyte and fluid balance, not elsewhere classified: Secondary | ICD-10-CM

## 2018-07-10 DIAGNOSIS — R918 Other nonspecific abnormal finding of lung field: Secondary | ICD-10-CM

## 2018-07-10 DIAGNOSIS — I10 Essential (primary) hypertension: Secondary | ICD-10-CM

## 2018-07-10 DIAGNOSIS — E876 Hypokalemia: Secondary | ICD-10-CM

## 2018-07-10 HISTORY — PX: VIDEO BRONCHOSCOPY: SHX5072

## 2018-07-10 LAB — CBC WITH DIFFERENTIAL/PLATELET
Basophils Absolute: 0 10*3/uL (ref 0.0–0.1)
Basophils Relative: 0 %
Eosinophils Absolute: 0.1 10*3/uL (ref 0.0–0.7)
Eosinophils Relative: 2 %
HEMATOCRIT: 39 % (ref 36.0–46.0)
HEMOGLOBIN: 12.9 g/dL (ref 12.0–15.0)
LYMPHS ABS: 2.8 10*3/uL (ref 0.7–4.0)
Lymphocytes Relative: 41 %
MCH: 28.3 pg (ref 26.0–34.0)
MCHC: 33.1 g/dL (ref 30.0–36.0)
MCV: 85.5 fL (ref 78.0–100.0)
MONOS PCT: 9 %
Monocytes Absolute: 0.6 10*3/uL (ref 0.1–1.0)
NEUTROS ABS: 3.3 10*3/uL (ref 1.7–7.7)
NEUTROS PCT: 48 %
Platelets: 367 10*3/uL (ref 150–400)
RBC: 4.56 MIL/uL (ref 3.87–5.11)
RDW: 17.1 % — ABNORMAL HIGH (ref 11.5–15.5)
WBC: 6.8 10*3/uL (ref 4.0–10.5)

## 2018-07-10 LAB — COMPREHENSIVE METABOLIC PANEL
ALBUMIN: 3 g/dL — AB (ref 3.5–5.0)
ALT: 30 U/L (ref 0–44)
ANION GAP: 10 (ref 5–15)
AST: 68 U/L — ABNORMAL HIGH (ref 15–41)
Alkaline Phosphatase: 243 U/L — ABNORMAL HIGH (ref 38–126)
BUN: 10 mg/dL (ref 6–20)
CO2: 31 mmol/L (ref 22–32)
Calcium: 9.2 mg/dL (ref 8.9–10.3)
Chloride: 93 mmol/L — ABNORMAL LOW (ref 98–111)
Creatinine, Ser: 0.55 mg/dL (ref 0.44–1.00)
GFR calc Af Amer: >60 mL/min (ref >60)
GFR calc non Af Amer: >60 mL/min (ref >60)
GLUCOSE: 97 mg/dL (ref 70–99)
POTASSIUM: 4.2 mmol/L (ref 3.5–5.1)
SODIUM: 134 mmol/L — AB (ref 135–145)
TOTAL PROTEIN: 6.5 g/dL (ref 6.5–8.1)
Total Bilirubin: 0.6 mg/dL (ref 0.3–1.2)

## 2018-07-10 LAB — MAGNESIUM: Magnesium: 1.9 mg/dL (ref 1.7–2.4)

## 2018-07-10 LAB — PROTIME-INR
INR: 0.99
Prothrombin Time: 13 seconds (ref 11.4–15.2)

## 2018-07-10 LAB — PTH-RELATED PEPTIDE

## 2018-07-10 LAB — PROCALCITONIN: Procalcitonin: 0.49 ng/mL

## 2018-07-10 SURGERY — VIDEO BRONCHOSCOPY WITHOUT FLUORO
Anesthesia: Moderate Sedation | Laterality: Bilateral

## 2018-07-10 MED ORDER — LIDOCAINE HCL 2 % EX GEL
1.0000 "application " | Freq: Once | CUTANEOUS | Status: DC
  Filled 2018-07-10: qty 5

## 2018-07-10 MED ORDER — SODIUM CHLORIDE 0.9 % IV SOLN
INTRAVENOUS | Status: DC
  Administered 2018-07-10: 11:00:00 via INTRAVENOUS

## 2018-07-10 MED ORDER — PHENYLEPHRINE HCL 0.25 % NA SOLN: 1.0000 | Freq: Four times a day (QID) | NASAL | Status: DC | PRN

## 2018-07-10 MED ORDER — PHENYLEPHRINE HCL 0.25 % NA SOLN
NASAL | Status: DC | PRN
  Administered 2018-07-10: 2 via NASAL

## 2018-07-10 MED ORDER — FENTANYL CITRATE (PF) 100 MCG/2ML IJ SOLN
INTRAMUSCULAR | Status: DC | PRN
  Administered 2018-07-10: 25 ug via INTRAVENOUS

## 2018-07-10 MED ORDER — LIDOCAINE HCL URETHRAL/MUCOSAL 2 % EX GEL
CUTANEOUS | Status: DC | PRN
  Administered 2018-07-10: 1

## 2018-07-10 MED ORDER — FENTANYL CITRATE (PF) 100 MCG/2ML IJ SOLN
INTRAMUSCULAR | Status: AC
  Filled 2018-07-10: qty 4

## 2018-07-10 MED ORDER — MIDAZOLAM HCL 10 MG/2ML IJ SOLN
INTRAMUSCULAR | Status: DC | PRN
  Administered 2018-07-10 (×3): 2 mg via INTRAVENOUS
  Administered 2018-07-10 (×2): 1 mg via INTRAVENOUS

## 2018-07-10 MED ORDER — BUTAMBEN-TETRACAINE-BENZOCAINE 2-2-14 % EX AERO: 1.0000 | INHALATION_SPRAY | Freq: Once | CUTANEOUS | Status: DC

## 2018-07-10 MED ORDER — ENSURE ENLIVE PO LIQD: 237.0000 mL | Freq: Two times a day (BID) | ORAL | 0 refills | Status: AC

## 2018-07-10 MED ORDER — LIDOCAINE HCL (PF) 1 % IJ SOLN
INTRAMUSCULAR | Status: DC | PRN
  Administered 2018-07-10: 6 mL

## 2018-07-10 MED ORDER — MIDAZOLAM HCL 5 MG/ML IJ SOLN
INTRAMUSCULAR | Status: AC
  Filled 2018-07-10: qty 2

## 2018-07-10 NOTE — Discharge Summary (Signed)
Physician Discharge Summary  [name]:[id number] DOB: March 01, 1956 DOA: 07/06/2018  PCP: [name], MD  Admit date: 07/06/2018 Discharge date: 07/10/2018  Admitted From: Home Disposition: Home  Recommendations for Outpatient Follow-up:  1. Follow up with PCP in 1 week 2. Please obtain BMP/CBC in one week 3. Please follow up on the following pending results: Biopsy from bronchoscopy  Home Health: None Equipment/Devices: None  Discharge Condition: Stable CODE STATUS: Full code Diet recommendation: Heart healthy   Brief/Interim Summary:  Admission HPI written by Truett Mainland, DO   Chief Complaint: set by PCP for low labs  HPI: [name] is a 62 y.o. female with a history of alpha-1 antitrypsin, COPD mixed type, fibromyalgia, GERD, IBS, hyperlipidemia, tobacco abuse, essential hypertension, fibromyalgia.  Patient was sent to the ER by PCP due to low labs.  She was initially seen about a month ago for cold.  She was put on azithromycin, but stopped after taking 1 tablet due to excessive nausea and vomiting.  She states that the nausea and vomiting triggered her reflux, causing her to have severe acid reflux.  Since that time, she has had diminished appetite and has had very little to eat.  She is drinking a lot of water and has urinated a lot.  No palliating or provoking factors.  She denies new medications, dose changes.  She is on chlorthalidone, but this dose has been stable for quite some time  Emergency Department Course: Sodium level 116, potassium 2.4.  Creatinine 0.44, BUN 5.  LFTs slightly increased: Alk phosphatase 246, AST 82 ALT 41.    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 Treated with IV fluids, fluid restriction and salt tablets initially with marked improvement. Discontinued chlorthalidone and Prozac on discharge.  Left lung mass Pulmonary consulted and bronchoscopy performed with biopsy.    Alpha-1 antitrypsin deficiency Outpatient management  Hypokalemia Hypochloremia Resolved with repletion.  Hypertension Discontinued chlorthalidone. Continue home regimen otherwise.  Discharge Diagnoses:  Principal Problem:   Hyponatremia Active Problems:   Alpha-1-antitrypsin deficiency (HCC)   COPD mixed type (HCC)   Essential hypertension   Hypochloremia   Hypokalemia   Mass of lower lobe of left lung   Malnutrition of moderate degree    Discharge Instructions  Discharge Instructions    Diet - low sodium heart healthy   Complete by:  As directed    Increase activity slowly   Complete by:  As directed      Allergies as of 07/10/2018      Reactions   Penicillins Nausea And Vomiting   Has patient had a PCN reaction causing immediate rash, facial/tongue/throat swelling, SOB or lightheadedness with hypotension: Unknown Has patient had a PCN reaction causing severe rash involving mucus membranes or skin necrosis: Unknown Has patient had a PCN reaction that required hospitalization: Unknown Has patient had a PCN reaction occurring within the last 10 years: Unknown If all of the above answers are "NO", then may proceed with Cephalosporin use.   Actonel [risedronate Sodium]    Caused her to retain fluid and stomach problems   Advair Diskus [fluticasone-salmeterol]    Alendronate Sodium    Stomach upset / esophageal burning   Aspirin Nausea Only   Stomach burns; abdominal pain   Azithromycin Nausea And Vomiting   Levofloxacin    Reports hx of GI distress and GI bleed   Morphine Itching, Swelling   Throat swells   Nsaids  Swelling, GI upset   Omnicef [cefdinir]    Erythromycin Rash   Iodine Rash   Topical and IV      Medication List    STOP taking these medications   chlorthalidone 25 MG tablet Commonly known as:  HYGROTON   FLUoxetine 20 MG capsule Commonly known as:  PROZAC     TAKE these medications   amitriptyline 75 MG tablet Commonly known  as:  ELAVIL Take 75 mg by mouth at bedtime.   BEVESPI AEROSPHERE 9-4.8 MCG/ACT Aero Generic drug:  Glycopyrrolate-Formoterol 2 PUFFS 2 TIMES A DAY   Compressor Nebulizer Misc 1 Device by Does not apply route as needed.   ezetimibe 10 MG tablet Commonly known as:  ZETIA TAKE 1 TABLET ONCE A DAY   feeding supplement (ENSURE ENLIVE) Liqd Take 237 mLs by mouth 2 (two) times daily between meals. Start taking on:  07/11/2018   fluticasone 50 MCG/ACT nasal spray Commonly known as:  FLONASE USE 2 SPRAYS IN EACH NOSTRIL ONCE DAILY.   folic acid 1 MG tablet Commonly known as:  FOLVITE Take 1 mg by mouth daily.   guaiFENesin 600 MG 12 hr tablet Commonly known as:  MUCINEX Take 1,200 mg by mouth 2 (two) times daily.   HYDROcodone-acetaminophen 5-325 MG tablet Commonly known as:  NORCO/VICODIN Take 1 tablet by mouth 3 (three) times daily.   meclizine 12.5 MG tablet Commonly known as:  ANTIVERT Take 1 tablet (12.5 mg total) by mouth 3 (three) times daily as needed for dizziness.   omeprazole 40 MG capsule Commonly known as:  PRILOSEC TAKE (1) CAPSULE DAILY   ondansetron 4 MG disintegrating tablet Commonly known as:  ZOFRAN-ODT Take 1 tablet (4 mg total) by mouth every 8 (eight) hours as needed for nausea or vomiting.   PROAIR HFA 108 (90 Base) MCG/ACT inhaler Generic drug:  albuterol 2 PUFFS EVERY 4 HOURS AS NEEDED FOR WHEEZING   albuterol (2.5 MG/3ML) 0.083% nebulizer solution Commonly known as:  PROVENTIL Take 3 mLs (2.5 mg total) by nebulization every 6 (six) hours as needed.   rosuvastatin 40 MG tablet Commonly known as:  CRESTOR TAKE 1/2 TABLET ONCE DAILY   umeclidinium-vilanterol 62.5-25 MCG/INH Aepb Commonly known as:  ANORO ELLIPTA Inhale 1 puff into the lungs daily.      Follow-up Information    [name], MD. Schedule an appointment as soon as possible for a visit in 1 week(s).   Specialty:  Family Medicine Contact information: [place] [address] [phone]        Laurin Coder, MD. Schedule an appointment as soon as possible for a visit in 1 week(s).   Specialty:  Pulmonary Disease Why:  Biopsy results Contact information: [address] [place] [address] [phone]          Allergies  Allergen Reactions  . Penicillins Nausea And Vomiting    Has patient had a PCN reaction causing immediate rash, facial/tongue/throat swelling, SOB or lightheadedness with hypotension: Unknown Has patient had a PCN reaction causing severe rash involving mucus membranes or skin necrosis: Unknown Has patient had a PCN reaction that required hospitalization: Unknown Has patient had a PCN reaction occurring within the last 10 years: Unknown If all of the above answers are "NO", then may proceed with Cephalosporin use.   Marland Kitchen Actonel [Risedronate Sodium]     Caused her to retain fluid and stomach problems  . Advair Diskus [Fluticasone-Salmeterol]   . Alendronate Sodium  Stomach upset / esophageal burning  . Aspirin Nausea Only    Stomach burns; abdominal pain  . Azithromycin Nausea And Vomiting  . Levofloxacin     Reports hx of GI distress and GI bleed  . Morphine Itching and Swelling    Throat swells  . Nsaids     Swelling, GI upset   . Omnicef [Cefdinir]   . Erythromycin Rash  . Iodine Rash    Topical and IV    Consultations:  Pulmonology   Procedures/Studies: Ct Chest W Contrast  Addendum Date: 07/06/2018   ADDENDUM REPORT: 07/06/2018 18:12 ADDENDUM: These results were called by telephone at the time of interpretation on 07/06/2018 at 5:20 pm to Dr. Nehemiah Settle, who verbally acknowledged these results. Electronically Signed   By: Fidela Salisbury M.D.   On: 07/06/2018 18:12   Result Date: 07/06/2018 CLINICAL DATA:  Elevated liver function tests. EXAM: CT CHEST WITH CONTRAST TECHNIQUE: Multidetector CT imaging of the chest was performed during intravenous contrast administration.  CONTRAST:  51m OMNIPAQUE IOHEXOL 300 MG/ML  SOLN COMPARISON:  Abdominal series radiograph 07/06/2018 FINDINGS: Cardiovascular: The heart is normal in size. No pericardial effusion. Calcific atherosclerotic disease of the coronary arteries and aorta, mild. Mediastinum/Nodes: Left-sided mediastinal lymphadenopathy at all lymph node station. Index abnormal lymph node in the AP window measures 2.2 cm in short axis. Subcarinal lymphadenopathy is also present. In the left hilar/subhilar region there is a soft tissue mass versus conglomerate of abnormal lymph nodes which measures approximately 6.0 x 3.1 x 5.8 cm. This mass causes compression of the left lower lobar pulmonary arterial branches and left lower lobar main bronchus, with subsequent complete collapse of the left lower lobe. There is also mass effect to the lower esophagus. Lungs/Pleura: Post compressive atelectasis of the left lower lobe. Additionally, there are multiple areas of ground-glass opacity in bilateral lungs. An index lesion in the periphery of the right upper lobe measures 4.0 x 3.8 cm, image 42/155. Upper Abdomen: Diffusely heterogeneous nodular appearance of the liver with lobulated contours. 1.3 cm in short axis lymph node in the gastrohepatic legs. Other small lymph nodes in porta hepatis. Musculoskeletal: No chest wall abnormality. No acute or significant osseous findings. IMPRESSION: Predominantly left-sided mediastinal, hilar and subcarinal lymphadenopathy. A 6 cm left hilar/subhilar mass may represent a primary malignancy versus a conglomerate of abnormal lymph nodes. It causes compression of the left lower lobe pulmonary arterial branches, left lower lobe main bronchus and the lower esophagus. Post compressive complete collapse of the left lower lobe. Multiple areas of ground-glass opacity in bilateral lungs. The index lesion in the periphery of the right upper lobe measures 4 cm. These may represent infectious or inflammatory  consolidation, however bronchoalveolar carcinoma cannot be excluded. Diffusely heterogeneous nodular appearance of the liver. This may represent changes of cirrhosis or alternatively diffuse hepatic metastatic disease. Moderate lymphadenopathy in the upper abdomen. Bronchoscopy with tissue sampling may be considered to arrive to a histologic diagnosis. Aortic Atherosclerosis (ICD10-I70.0). Electronically Signed: By: DFidela SalisburyM.D. On: 07/06/2018 17:12   Dg Chest Port 1 View  Result Date: 07/10/2018 CLINICAL DATA:  Post bronchoscopy and left lung biopsy. Now with increased cough. EXAM: PORTABLE CHEST 1 VIEW COMPARISON:  Portable chest x-ray of July 08, 2018 FINDINGS: The right lung is well-expanded. Coarse lung markings in the right upper lobe persists. On the left there is patchy density at the left lung base which is stable. The interstitial markings are slightly more in the left lung. There is no  pneumothorax, pneumomediastinum, or pleural effusion. The heart and pulmonary vascularity are normal. IMPRESSION: There is no postprocedure complication following bronchoscopy on the left with lung biopsies. Electronically Signed   By: David  Martinique M.D.   On: 07/10/2018 12:45   Dg Chest Port 1 View  Result Date: 07/08/2018 CLINICAL DATA:  Rhonchi. EXAM: PORTABLE CHEST 1 VIEW COMPARISON:  None. FINDINGS: The heart size and mediastinal contours are within normal limits. No pneumothorax or pleural effusion is noted. Right lung is clear. Mild left basilar subsegmental atelectasis is noted. The visualized skeletal structures are unremarkable. IMPRESSION: Mild left basilar subsegmental atelectasis. Electronically Signed   By: Marijo Conception, M.D.   On: 07/08/2018 07:22   Dg Abd Acute W/chest  Result Date: 07/06/2018 CLINICAL DATA:  Nodule vomiting 2 months. EXAM: DG ABDOMEN ACUTE W/ 1V CHEST COMPARISON:  Chest x-ray 06/12/2017 FINDINGS: Lungs are adequately inflated with 1 cm nodule opacity over the  lateral left midlung. Linear scarring lateral left base. Subtle hazy density over the right upper lobe. No effusion. Cardiac silhouette is within normal. Mild increased density over the AP window. Bowel gas pattern is nonobstructive. No free peritoneal air. Multiple surgical clips over the pelvis. Degenerate change of the spine and hips. Interbody fusion of L4-5. IMPRESSION: Subtle hazy density over the right upper lobe which may be due to acute infection. 1 cm nodular opacity over the lateral left midlung. Increased density and prominence of the AP window as cannot exclude mass/adenopathy. Consider contrast-enhanced chest CT for further evaluation. Nonobstructive bowel gas pattern. Electronically Signed   By: Marin Olp M.D.   On: 07/06/2018 14:43      Subjective: No issues today. No dyspnea.  Discharge Exam: Vitals:   07/10/18 1220 07/10/18 1225  BP:  (!) 117/56  Pulse:    Resp: (!) 21 19  Temp:    SpO2: 95% 93%   Vitals:   07/10/18 1210 07/10/18 1215 07/10/18 1220 07/10/18 1225  BP: (!) 124/58 113/66  (!) 117/56  Pulse:      Resp: (!) 23 (!) 23 (!) 21 19  Temp:      TempSrc:      SpO2: 93% 95% 95% 93%  Weight:      Height:        General: Pt is alert, awake, not in acute distress Cardiovascular: RRR, S1/S2 +, no rubs, no gallops Respiratory: CTA bilaterally, no wheezing, no rhonchi Abdominal: Soft, NT, ND, bowel sounds + Extremities: no edema, no cyanosis    The results of significant diagnostics from this hospitalization (including imaging, microbiology, ancillary and laboratory) are listed below for reference.     Microbiology: Recent Results (from the past 240 hour(s))  MRSA PCR Screening     Status: None   Collection Time: 07/07/18  6:30 AM  Result Value Ref Range Status   MRSA by PCR NEGATIVE NEGATIVE Final    Comment:        The GeneXpert MRSA Assay (FDA approved for NASAL specimens only), is one component of a comprehensive MRSA  colonization surveillance program. It is not intended to diagnose MRSA infection nor to guide or monitor treatment for MRSA infections. Performed at [facility], [place] [address] [place], [place] [address]      Labs: BNP (last 3 results) Recent Labs    07/07/18 0922 07/08/18 0839  BNP 28.4 03.5   Basic Metabolic Panel: Recent Labs  Lab 07/06/18 1310  07/07/18 2046 07/08/18 0031 07/08/18 4656 07/09/18 0551 07/10/18 8127  NA 116*   < > 132* 132* 130* 133* 134*  K 2.4*   < > 3.4* 3.5 3.6 3.6 4.2  CL 77*   < > 95* 94* 92* 92* 93*  CO2 29   < > _0 32 31  GLUCOSE 112*   < > 104* 108* 102* 105* 97  BUN 5*   < > 6 7 5* 8 10  CREATININE 0.44   < > 0.49 0.44 0.40* 0.45 0.55  CALCIUM 8.6*   < > 9.0 9.1 8.9 9.0 9.2  MG 1.8  --   --  1.9  --   --  1.9   < > = values in this interval not displayed.   Liver Function Tests: Recent Labs  Lab 07/05/18 1546 07/06/18 1310 07/07/18 0922 07/10/18 0605  AST 92* 82* 81* 68*  ALT 46* 41 42 30  ALKPHOS 312* 246* 248* 243*  BILITOT 0.6 1.1 0.6 0.6  PROT 7.1 7.4 7.5 6.5  ALBUMIN 4.0 3.4* 3.2* 3.0*   Recent Labs  Lab 07/06/18 1310  LIPASE 41   No results for input(s): AMMONIA in the last 168 hours. CBC: Recent Labs  Lab 07/05/18 1546 07/06/18 1310 07/08/18 0031 07/09/18 0551 07/10/18 0605  WBC 8.0 8.4 8.0 6.7 6.8  NEUTROABS 5.2 5.6  --   --  3.3  HGB 14.4 14.1 13.1 13.0 12.9  HCT 41.3 39.4 37.8 38.5 39.0  MCV 82 81.7 84.2 84.6 85.5  PLT 438 375 371 382 367   Cardiac Enzymes: Recent Labs  Lab 07/06/18 1310  TROPONINI <0.03   BNP: Invalid input(s): POCBNP CBG: No results for input(s): GLUCAP in the last 168 hours. D-Dimer No results for input(s): DDIMER in the last 72 hours. Hgb A1c No results for input(s): HGBA1C in the last 72 hours. Lipid Profile No results for input(s): CHOL, HDL, LDLCALC, TRIG, CHOLHDL, LDLDIRECT in the last 72 hours. Thyroid function studies No results for  input(s): TSH, T4TOTAL, T3FREE, THYROIDAB in the last 72 hours.  Invalid input(s): FREET3 Anemia work up No results for input(s): VITAMINB12, FOLATE, FERRITIN, TIBC, IRON, RETICCTPCT in the last 72 hours. Urinalysis    Component Value Date/Time   COLORURINE YELLOW 07/06/2018 1618   APPEARANCEUR CLEAR 07/06/2018 1618   APPEARANCEUR Hazy (A) 07/19/2016 1512   LABSPEC 1.006 07/06/2018 1618   PHURINE 8.0 07/06/2018 1618   GLUCOSEU NEGATIVE 07/06/2018 1618   HGBUR NEGATIVE 07/06/2018 1618   BILIRUBINUR NEGATIVE 07/06/2018 1618   BILIRUBINUR Negative 07/19/2016 1512   KETONESUR 20 (A) 07/06/2018 1618   PROTEINUR NEGATIVE 07/06/2018 1618   UROBILINOGEN negative 07/14/2013 1553   NITRITE NEGATIVE 07/06/2018 1618   LEUKOCYTESUR NEGATIVE 07/06/2018 1618   LEUKOCYTESUR 2+ (A) 07/19/2016 1512   Sepsis Labs Invalid input(s): PROCALCITONIN,  WBC,  LACTICIDVEN Microbiology Recent Results (from the past 240 hour(s))  MRSA PCR Screening     Status: None   Collection Time: 07/07/18  6:30 AM  Result Value Ref Range Status   MRSA by PCR NEGATIVE NEGATIVE Final    Comment:        The GeneXpert MRSA Assay (FDA approved for NASAL specimens only), is one component of a comprehensive MRSA colonization surveillance program. It is not intended to diagnose MRSA infection nor to guide or monitor treatment for MRSA infections. Performed at [facility], [place] [address] [place], [place] [address]     SIGNED:   Cordelia Poche, MD [facility] 07/10/2018, 2:59 PM

## 2018-07-10 NOTE — Discharge Instructions (Signed)
[name],  You were admitted because of low sodium. This has improved. Please stop taking chlorthalidone and Prozac. You were also found to have a lung nodule which was biopsied. Please follow-up with your primary care physician and pulmonologist for results.

## 2018-07-10 NOTE — Progress Notes (Signed)
PT Cancellation Note  Patient Details Name: ZAMZAM WHINERY MRN: [id number] DOB: [birth year]   Cancelled Treatment:    Reason Eval/Treat Not Completed: PT screened, no needs identified, will sign off   Weston Anna, MPT Pager: [phone]

## 2018-07-10 NOTE — Progress Notes (Signed)
PT Cancellation Note  Patient Details Name: [name] MRN: [id number] DOB: 20-Feb-1956   Cancelled Treatment:    Reason Eval/Treat Not Completed: Patient at procedure or test/unavailable. Will check back as schedule allows.   Weston Anna, MPT Pager: [phone]

## 2018-07-10 NOTE — Op Note (Signed)
Bronchoscopy Procedure Note  Date of Operation: 07/10/2018   Pre-op Diagnosis: Left lung mass  Post-op Diagnosis: Left lung mass  Surgeon: Sherrilyn Rist  Anesthesia: Monitored Local Anesthesia with Sedation Time Started: 1130 Time Stopped: 1154  Operation: Video Flexible fiberoptic bronchoscopy, diagnostic   Findings: Mass in the left lower lobe with significant narrowing of the orifice to the left lower lobe Significant mucosal swelling at the orifice of the lingula Specimen:  Washings Brushings Tissue biopsy Aspiration of the subcarinal node  Estimated Blood Loss: 5 to 7 cc  Complications: No complications  Indications and History: See updated H and P same date. The risks, benefits, complications, treatment options and expected outcomes were discussed with the patient.  The possibilities of reaction to medication, pulmonary aspiration, perforation of a viscus, bleeding, failure to diagnose a condition and creating a complication requiring transfusion or operation were discussed with the patient who freely signed the consent.    Description of Procedure: The patient was re-examined in the bronchoscopy suite and the site of surgery properly noted/marked.  The patient was identified  and the procedure verified as Flexible Fiberoptic Bronchoscopy.  A Time Out was held and the above information confirmed.   After the induction of topical nasopharyngeal anesthesia, the patient was positioned  and the bronchoscope was passed through orally. The vocal cords were visualized and  1% buffered lidocaine 5 ml was topically placed onto the cords. The cords were mobile with no lesions. The scope was then passed into the trachea.  1% buffered lidocaine given topically. Airways inspected bilaterally to the subsegmental level with the following findings: Normal carina Right lung was examined-right upper lobe, right middle lobe right lower lobe bronchi well visualized with no  lesions Bronchoscope withdrawn back to the carina introduced into the left lung Narrowing in the left lower lobe noted Mucosal swelling at the orifice of the lingula noted    Interventions:  Washings left lower lobe Brushings left lower lobe Tissue biopsy left lower lobe Needle aspiration of subcarinal node attempted-bloody aspirate  Sample sent for analysis     The Patient was taken to the Endoscopy Recovery area in satisfactory condition.  Attestation: I performed the procedure.  Sherrilyn Rist, MD

## 2018-07-10 NOTE — Care Management Note (Signed)
Case Management Note  Patient Details  Name: [name] MRN: [id number] Date of Birth: 1956-02-12  Subjective/Objective: No CM needs.                   Action/Plan:d/c Home.   Expected Discharge Date:  07/10/18               Expected Discharge Plan:  Home/Self Care  In-House Referral:     Discharge planning Services  CM Consult  Post Acute Care Choice:    Choice offered to:     DME Arranged:    DME Agency:     HH Arranged:    HH Agency:     Status of Service:  Completed, signed off  If discussed at [employer] of Stay Meetings, dates discussed:    Additional Comments:  Dessa Phi, RN 07/10/2018, 3:01 PM

## 2018-07-10 NOTE — Interval H&P Note (Signed)
History and Physical Interval Note:  07/10/2018 11:20 AM  [name]  has presented today for surgery, with the diagnosis of Rule out cancer  The various methods of treatment have been discussed with the patient and family. After consideration of risks, benefits and other options for treatment, the patient has consented to  Procedure(s): VIDEO BRONCHOSCOPY WITHOUT FLUORO (Bilateral) as a surgical intervention .  The patient's history has been reviewed, patient examined, no change in status, stable for surgery.  I have reviewed the patient's chart and labs.  Questions were answered to the patient's satisfaction.     Adewale A Olalere

## 2018-07-10 NOTE — Progress Notes (Signed)
Video bronchoscopy performed Intervention bronchial washings Intervention bronchial brushings Intervention bronchial biopsies Intervention needle biopsy Patient tolerated well  Kathie Dike RRT

## 2018-07-11 ENCOUNTER — Encounter (HOSPITAL_COMMUNITY): Payer: Self-pay | Admitting: Pulmonary Disease

## 2018-07-11 ENCOUNTER — Telehealth: Payer: Self-pay | Admitting: Internal Medicine

## 2018-07-11 ENCOUNTER — Telehealth: Payer: Self-pay | Admitting: Family Medicine

## 2018-07-11 DIAGNOSIS — C801 Malignant (primary) neoplasm, unspecified: Secondary | ICD-10-CM

## 2018-07-11 NOTE — Telephone Encounter (Signed)
Spoke with Dr. Annamaria Boots; He will call MD today about results. Will forward to him as requested.

## 2018-07-11 NOTE — Addendum Note (Signed)
Addended by: Maryanna Shape A on: 07/11/2018 05:17 PM   Modules accepted: Orders

## 2018-07-11 NOTE — Telephone Encounter (Signed)
Spoke with Husband = they were told to follow up in 1 week for hosp follow up - was told possible lung cancer and Biopsy results not back at that time. They preferred DR Laurance Flatten or Dr Lajuana Ripple - no appt available with Dr Laurance Flatten

## 2018-07-11 NOTE — Telephone Encounter (Signed)
[name], see message, I checked Dr. Tawanna Sat schedule and didn't really see anywhere to put here.  Thought maybe you could take a look.

## 2018-07-11 NOTE — Telephone Encounter (Signed)
I was called by pathologist. Bronchoscopy biopsy shows Small Cell Carcinoma. She is aware and accepts referral to Medical Oncology.   Order- referral to [facility] fo dx Small Cell Carcinoma of lung, based on recent bronchoscopy

## 2018-07-11 NOTE — Telephone Encounter (Signed)
Referral has been placed to oncology.  Pt has been made aware per Dr. Annamaria Boots. Nothing further is needed.

## 2018-07-12 ENCOUNTER — Other Ambulatory Visit: Payer: Self-pay | Admitting: Internal Medicine

## 2018-07-12 ENCOUNTER — Other Ambulatory Visit: Payer: Self-pay | Admitting: Family Medicine

## 2018-07-12 ENCOUNTER — Telehealth: Payer: Self-pay | Admitting: Internal Medicine

## 2018-07-12 LAB — CULTURE, RESPIRATORY W GRAM STAIN

## 2018-07-12 LAB — CULTURE, RESPIRATORY

## 2018-07-12 NOTE — Telephone Encounter (Signed)
Called patient, unable to reach left message to give us a call back. 

## 2018-07-12 NOTE — Telephone Encounter (Signed)
Called spoke with patient's spouse (dpr on file) who reported that patient does not want to receive treatment at [facility] and instead would like to switch back to [facility].  Advised spouse that a message has been sent to Norton Blizzard RN with the [facility] to assist with this and we will call him back once we receive word.  Spouse voiced his understanding.  Staff message sent to [facility] RN Will also route this message to ensure follow up

## 2018-07-15 ENCOUNTER — Inpatient Hospital Stay: Payer: Medicare Other | Attending: Internal Medicine

## 2018-07-15 ENCOUNTER — Encounter: Payer: Self-pay | Admitting: Internal Medicine

## 2018-07-15 ENCOUNTER — Inpatient Hospital Stay (HOSPITAL_BASED_OUTPATIENT_CLINIC_OR_DEPARTMENT_OTHER): Payer: Medicare Other | Admitting: Internal Medicine

## 2018-07-15 ENCOUNTER — Telehealth: Payer: Self-pay | Admitting: *Deleted

## 2018-07-15 VITALS — BP 126/72 | HR 118 | Temp 97.6°F | Resp 22 | Ht 65.0 in | Wt 166.7 lb

## 2018-07-15 DIAGNOSIS — Z79899 Other long term (current) drug therapy: Secondary | ICD-10-CM | POA: Insufficient documentation

## 2018-07-15 DIAGNOSIS — K219 Gastro-esophageal reflux disease without esophagitis: Secondary | ICD-10-CM | POA: Insufficient documentation

## 2018-07-15 DIAGNOSIS — Z87891 Personal history of nicotine dependence: Secondary | ICD-10-CM | POA: Insufficient documentation

## 2018-07-15 DIAGNOSIS — K589 Irritable bowel syndrome without diarrhea: Secondary | ICD-10-CM | POA: Insufficient documentation

## 2018-07-15 DIAGNOSIS — M797 Fibromyalgia: Secondary | ICD-10-CM | POA: Diagnosis not present

## 2018-07-15 DIAGNOSIS — C3411 Malignant neoplasm of upper lobe, right bronchus or lung: Secondary | ICD-10-CM | POA: Diagnosis not present

## 2018-07-15 DIAGNOSIS — R5382 Chronic fatigue, unspecified: Secondary | ICD-10-CM

## 2018-07-15 DIAGNOSIS — E559 Vitamin D deficiency, unspecified: Secondary | ICD-10-CM | POA: Insufficient documentation

## 2018-07-15 DIAGNOSIS — Z5112 Encounter for antineoplastic immunotherapy: Secondary | ICD-10-CM

## 2018-07-15 DIAGNOSIS — J449 Chronic obstructive pulmonary disease, unspecified: Secondary | ICD-10-CM | POA: Insufficient documentation

## 2018-07-15 DIAGNOSIS — C3412 Malignant neoplasm of upper lobe, left bronchus or lung: Secondary | ICD-10-CM | POA: Insufficient documentation

## 2018-07-15 DIAGNOSIS — R590 Localized enlarged lymph nodes: Secondary | ICD-10-CM | POA: Diagnosis not present

## 2018-07-15 DIAGNOSIS — E785 Hyperlipidemia, unspecified: Secondary | ICD-10-CM | POA: Diagnosis not present

## 2018-07-15 DIAGNOSIS — Z8541 Personal history of malignant neoplasm of cervix uteri: Secondary | ICD-10-CM | POA: Insufficient documentation

## 2018-07-15 DIAGNOSIS — R918 Other nonspecific abnormal finding of lung field: Secondary | ICD-10-CM

## 2018-07-15 DIAGNOSIS — Z7189 Other specified counseling: Secondary | ICD-10-CM | POA: Insufficient documentation

## 2018-07-15 DIAGNOSIS — C3492 Malignant neoplasm of unspecified part of left bronchus or lung: Secondary | ICD-10-CM

## 2018-07-15 DIAGNOSIS — Z5111 Encounter for antineoplastic chemotherapy: Secondary | ICD-10-CM | POA: Insufficient documentation

## 2018-07-15 LAB — CBC WITH DIFFERENTIAL (CANCER CENTER ONLY)
BASOS PCT: 0 %
Basophils Absolute: 0 10*3/uL (ref 0.0–0.1)
Eosinophils Absolute: 0 10*3/uL (ref 0.0–0.5)
Eosinophils Relative: 0 %
HCT: 37.5 % (ref 34.8–46.6)
HEMOGLOBIN: 12.8 g/dL (ref 11.6–15.9)
LYMPHS ABS: 2 10*3/uL (ref 0.9–3.3)
Lymphocytes Relative: 12 %
MCH: 28.7 pg (ref 25.1–34.0)
MCHC: 34.1 g/dL (ref 31.5–36.0)
MCV: 84.1 fL (ref 79.5–101.0)
MONO ABS: 1.7 10*3/uL — AB (ref 0.1–0.9)
MONOS PCT: 10 %
NEUTROS ABS: 13.2 10*3/uL — AB (ref 1.5–6.5)
Neutrophils Relative %: 78 %
Platelet Count: 444 10*3/uL — ABNORMAL HIGH (ref 145–400)
RBC: 4.46 MIL/uL (ref 3.70–5.45)
RDW: 17 % — ABNORMAL HIGH (ref 11.2–14.5)
WBC Count: 16.9 10*3/uL — ABNORMAL HIGH (ref 3.9–10.3)

## 2018-07-15 LAB — CMP (CANCER CENTER ONLY)
ALBUMIN: 2.4 g/dL — AB (ref 3.5–5.0)
ALK PHOS: 364 U/L — AB (ref 38–126)
ALT: 41 U/L (ref 0–44)
AST: 118 U/L — AB (ref 15–41)
Anion gap: 11 (ref 5–15)
BILIRUBIN TOTAL: 0.8 mg/dL (ref 0.3–1.2)
BUN: 10 mg/dL (ref 6–20)
CO2: 29 mmol/L (ref 22–32)
Calcium: 9.6 mg/dL (ref 8.9–10.3)
Chloride: 88 mmol/L — ABNORMAL LOW (ref 98–111)
Creatinine: 0.67 mg/dL (ref 0.44–1.00)
GFR, Est AFR Am: >60 mL/min (ref >60)
GFR, Estimated: >60 mL/min (ref >60)
GLUCOSE: 103 mg/dL — AB (ref 70–99)
POTASSIUM: 3.6 mmol/L (ref 3.5–5.1)
SODIUM: 128 mmol/L — AB (ref 135–145)
TOTAL PROTEIN: 7.5 g/dL (ref 6.5–8.1)

## 2018-07-15 MED ORDER — BENZONATATE 100 MG PO CAPS: 100.0000 mg | ORAL_CAPSULE | Freq: Three times a day (TID) | ORAL | 0 refills | Status: DC | PRN

## 2018-07-15 MED ORDER — PROCHLORPERAZINE MALEATE 10 MG PO TABS: 10.0000 mg | ORAL_TABLET | Freq: Four times a day (QID) | ORAL | 0 refills | Status: DC | PRN

## 2018-07-15 NOTE — Telephone Encounter (Signed)
Oncology Nurse Navigator Documentation  Oncology Nurse Navigator Flowsheets 07/15/2018  Navigator Location [facility]  Referral date to RadOnc/MedOnc 07/15/2018  Navigator Encounter Type Telephone/patient would like to be seen here in [place]. I called patient and she is scheduled to be seen with Dr. Julien Nordmann today  Telephone Outgoing Call  Treatment Phase Pre-Tx/Tx Discussion  Barriers/Navigation Needs Coordination of Care  Interventions Coordination of Care  Acuity Level 2  Time Spent with Patient 30

## 2018-07-15 NOTE — Progress Notes (Signed)
[name] [phone]   [phone]  CONSULT NOTE  REFERRING PHYSICIAN: Dr. Baird Lyons  REASON FOR CONSULTATION:  62 years old white female recently diagnosed with lung cancer.  HPI [name] is a 62 y.o. female with past medical history significant for COPD, fibromyalgia, dyslipidemia, GERD, irritable bowel syndrome, sleep apnea, vitamin D deficiency, pulmonary fibrosis, history of cervical cancer 14 years ago status post resection, alpha 1 antitrypsin as well as history of his smoking but quit 13 years ago.  The patient mentioned that she had an episode of sinusitis and she was seen by Dr. Annamaria Boots and started on antibiotics.  She took 1 pill and she had significant nausea and vomiting.  She presented to [facility] for evaluation.  She had acute abdominal series performed at that time and the chest x-ray portion of the study on 07/06/2018 showed supple hazy density over the right upper lobe as well as 1.0 cm nodular opacity over the lateral left midlung with increased density and prominence of the AP window.  This was followed by CT scan of the chest on the same day and it showed left-sided mediastinal lymphadenopathy at all lymph node station with index abnormal lymph node in the AP window measuring 2.2 cm in short axis.  Subcarinal lymphadenopathy is also present.  There was left hilar/subhilar region soft tissue mass versus conglomerate of abnormal lymph nodes measured approximately 6.0 x 3.1 x 5.8 cm.  This mass causes compression of the left lower lobe pulmonary arterial branches and left lower lobe main bronchus with subsequent complete collapse of the left lower lobe.  There was also mass-effect to the lower esophagus.  In the lung there are multiple areas of groundglass opacity in the lungs bilaterally with an index lesion in the periphery of the right upper lobe measured 4.0 x 3.8 cm.  Scan of the upper abdomen showed 1.3 cm short axis  lymph node in the gastrohepatic ligament area. On 07/10/2018 the patient underwent video flexible fiberoptic bronchoscopy with bronchial washing, brushing as well as tissue biopsy of the mass in the left lower lobe and aspiration of the subcarinal lymph node under the care of Dr. Cicero Duck.  The final pathology ([id number]) was consistent with small cell carcinoma. Dr. Annamaria Boots kindly referred the patient to me today for evaluation and recommendation regarding treatment of her condition.  When seen today the patient continues to complain of cough and shortness of breath at baseline increased with exertion but no significant chest pain or hemoptysis.  She lost around 22 pounds in the last few months.  She denied having any headache or visual changes.  She has no nausea, vomiting, diarrhea or constipation. Family history significant for father with heart attack, mother had pancreatic cancer, brother had small cell lung cancer he was my patient several years ago.  Sister had alpha-1 antitrypsin. The patient is married and has 2 children his son and daughter.  She was accompanied today by her husband [name].  She does sewing work.  She has a history for smoking 1 pack/day for around 35 years and quit 13 years ago.  She has no history of alcohol or drug abuse.  HPI  Past Medical History:  Diagnosis Date  . Alpha-1-antitrypsin deficiency ([place])   . Cervical cancer ([place]) 2004  . COPD mixed type ([place]) 09/14/2007   PFT- 05/06/2013-reduced FVC may reflect effort but the overall pattern is moderate obstructive airways disease with response to bronchodilator,  air trapping, normal diffusion. This doesn't fit entirely with emphysema.      . Exposure to hepatitis C 09/12/2016  . Fibromyalgia   . GERD (gastroesophageal reflux disease)   . Hyperlipidemia   . IBS (irritable bowel syndrome)   . Obstructive sleep apnea 06/02/2008   NPSG 06/07/08, AHI 6.9/ hr, weight 220 lbs    . Osteoporosis   . Pulmonary fibrosis,  unspecified ([place]) 09/29/2016   CXR 03/28/2016  . Rhinitis, nonallergic 05/08/2012  . 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 Vitamin D deficiency     Past Surgical History:  Procedure Laterality Date  . ABDOMINAL HYSTERECTOMY    . LUMBAR DISC SURGERY    . VIDEO BRONCHOSCOPY Bilateral 07/10/2018   Procedure: VIDEO BRONCHOSCOPY WITHOUT FLUORO;  Surgeon: Laurin Coder, MD;  Location: [facility];  Service: Cardiopulmonary;  Laterality: Bilateral;    Family History  Problem Relation Age of Onset  . Alpha-1 antitrypsin deficiency Sister   . Osteoporosis Sister   . Pancreatic cancer Mother 14  . Osteoporosis Mother   . Irregular heart beat Mother   . Heart attack Father 25       Died age 44  . Migraines Father   . CAD Sister 33       Died age 15  . Osteoporosis Sister   . Lung cancer Brother   . CAD Brother 42       CABG.  Died from leukemia  . Leukemia Brother   . Heart attack Brother   . Alpha-1 antitrypsin deficiency Other   . Heart disease Brother   . Colon cancer Neg Hx     Social History Social History   Tobacco Use  . Smoking status: Former Smoker    Packs/day: 0.50    Types: Cigarettes    Start date: 11/20/1972    Last attempt to quit: 04/01/2012    Years since quitting: 6.2  . Smokeless tobacco: Never Used  Substance Use Topics  . Alcohol use: No  . Drug use: No    Allergies  Allergen Reactions  . Penicillins Nausea And Vomiting    Has patient had a PCN reaction causing immediate rash, facial/tongue/throat swelling, SOB or lightheadedness with hypotension: Unknown Has patient had a PCN reaction causing severe rash involving mucus membranes or skin necrosis: Unknown Has patient had a PCN reaction that required hospitalization: Unknown Has patient had a PCN reaction occurring within the last 10 years: Unknown If all of the above answers are "NO", then may proceed with Cephalosporin use.   Marland Kitchen Actonel [Risedronate Sodium]      Caused her to retain fluid and stomach problems  . Advair Diskus [Fluticasone-Salmeterol]   . Alendronate Sodium     Stomach upset / esophageal burning  . Aspirin Nausea Only    Stomach burns; abdominal pain  . Azithromycin Nausea And Vomiting  . Levofloxacin     Reports hx of GI distress and GI bleed  . Morphine Itching and Swelling    Throat swells  . Nsaids     Swelling, GI upset   . Omnicef [Cefdinir]   . Erythromycin Rash  . Iodine Rash    Topical and IV    Current Outpatient Medications  Medication Sig Dispense Refill  . albuterol (PROVENTIL) (2.5 MG/3ML) 0.083% nebulizer solution Take 3 mLs (2.5 mg total) by nebulization every 6 (six) hours as needed. 90 mL 3  . amitriptyline (ELAVIL) 75 MG tablet Take  75 mg by mouth at bedtime.      Marland Kitchen BEVESPI AEROSPHERE 9-4.8 MCG/ACT AERO 2 PUFFS 2 TIMES A DAY 10.7 g 0  . ezetimibe (ZETIA) 10 MG tablet TAKE 1 TABLET ONCE A DAY 90 tablet 1  . feeding supplement, ENSURE ENLIVE, (ENSURE ENLIVE) LIQD Take 237 mLs by mouth 2 (two) times daily between meals. 60 Bottle 0  . fluticasone (FLONASE) 50 MCG/ACT nasal spray USE 2 SPRAYS IN EACH NOSTRIL ONCE DAILY. 16 g 5  . folic acid (FOLVITE) 1 MG tablet Take 1 mg by mouth daily.    Marland Kitchen guaiFENesin (MUCINEX) 600 MG 12 hr tablet Take 1,200 mg by mouth 2 (two) times daily.      Marland Kitchen HYDROcodone-acetaminophen (NORCO/VICODIN) 5-325 MG tablet Take 1 tablet by mouth 3 (three) times daily.    . meclizine (ANTIVERT) 12.5 MG tablet Take 1 tablet (12.5 mg total) by mouth 3 (three) times daily as needed for dizziness. 30 tablet 1  . Nebulizers (COMPRESSOR NEBULIZER) MISC 1 Device by Does not apply route as needed. 1 each prn  . omeprazole (PRILOSEC) 40 MG capsule TAKE (1) CAPSULE DAILY 90 capsule 1  . ondansetron (ZOFRAN ODT) 4 MG disintegrating tablet Take 1 tablet (4 mg total) by mouth every 8 (eight) hours as needed for nausea or vomiting. 20 tablet 0  . PROAIR HFA 108 (90 Base) MCG/ACT inhaler 2 PUFFS EVERY  4 HOURS AS NEEDED FOR WHEEZING 8.5 g 2  . rosuvastatin (CRESTOR) 40 MG tablet TAKE 1/2 TABLET ONCE DAILY 45 tablet 1  . umeclidinium-vilanterol (ANORO ELLIPTA) 62.5-25 MCG/INH AEPB Inhale 1 puff into the lungs daily. 1 each 0   No current facility-administered medications for this visit.     Review of Systems  Constitutional: positive for fatigue and weight loss Eyes: negative Ears, nose, mouth, throat, and face: negative Respiratory: positive for cough and dyspnea on exertion Cardiovascular: negative Gastrointestinal: negative Genitourinary:negative Integument/breast: negative Hematologic/lymphatic: negative Musculoskeletal:positive for arthralgias Neurological: negative Behavioral/Psych: negative Endocrine: negative Allergic/Immunologic: negative  Physical Exam  DPO:EUMPN, healthy, no distress, well nourished, well developed and anxious SKIN: skin color, texture, turgor are normal, no rashes or significant lesions HEAD: Normocephalic, No masses, lesions, tenderness or abnormalities EYES: normal, PERRLA, Conjunctiva are pink and non-injected EARS: External ears normal, Canals clear OROPHARYNX:no exudate, no erythema and lips, buccal mucosa, and tongue normal  NECK: supple, no adenopathy, no JVD LYMPH:  no palpable lymphadenopathy, no hepatosplenomegaly BREAST:not examined LUNGS: clear to auscultation , and palpation HEART: regular rate & rhythm, no murmurs and no gallops ABDOMEN:abdomen soft, non-tender, normal bowel sounds and no masses or organomegaly BACK: No CVA tenderness, Range of motion is normal EXTREMITIES:no joint deformities, effusion, or inflammation, no edema  NEURO: alert & oriented x 3 with fluent speech, no focal motor/sensory deficits  PERFORMANCE STATUS: ECOG 1  LABORATORY DATA: Lab Results  Component Value Date   WBC 16.9 (H) 07/15/2018   HGB 12.8 07/15/2018   HCT 37.5 07/15/2018   MCV 84.1 07/15/2018   PLT 444 (H) 07/15/2018      Chemistry        Component Value Date/Time   NA 134 (L) 07/10/2018 0605   NA 124 (L) 07/05/2018 1546   K 4.2 07/10/2018 0605   CL 93 (L) 07/10/2018 0605   CO2 31 07/10/2018 0605   BUN 10 07/10/2018 0605   BUN 4 (L) 07/05/2018 1546   CREATININE 0.55 07/10/2018 0605   CREATININE 0.68 02/13/2013 1523      Component Value  Date/Time   CALCIUM 9.2 07/10/2018 0605   ALKPHOS 243 (H) 07/10/2018 0605   AST 68 (H) 07/10/2018 0605   ALT 30 07/10/2018 0605   BILITOT 0.6 07/10/2018 0605   BILITOT 0.6 07/05/2018 1546       RADIOGRAPHIC STUDIES: Ct Chest W Contrast  Addendum Date: 07/06/2018   ADDENDUM REPORT: 07/06/2018 18:12 ADDENDUM: These results were called by telephone at the time of interpretation on 07/06/2018 at 5:20 pm to Dr. Nehemiah Settle, who verbally acknowledged these results. Electronically Signed   By: Fidela Salisbury M.D.   On: 07/06/2018 18:12   Result Date: 07/06/2018 CLINICAL DATA:  Elevated liver function tests. EXAM: CT CHEST WITH CONTRAST TECHNIQUE: Multidetector CT imaging of the chest was performed during intravenous contrast administration. CONTRAST:  49mL OMNIPAQUE IOHEXOL 300 MG/ML  SOLN COMPARISON:  Abdominal series radiograph 07/06/2018 FINDINGS: Cardiovascular: The heart is normal in size. No pericardial effusion. Calcific atherosclerotic disease of the coronary arteries and aorta, mild. Mediastinum/Nodes: Left-sided mediastinal lymphadenopathy at all lymph node station. Index abnormal lymph node in the AP window measures 2.2 cm in short axis. Subcarinal lymphadenopathy is also present. In the left hilar/subhilar region there is a soft tissue mass versus conglomerate of abnormal lymph nodes which measures approximately 6.0 x 3.1 x 5.8 cm. This mass causes compression of the left lower lobar pulmonary arterial branches and left lower lobar main bronchus, with subsequent complete collapse of the left lower lobe. There is also mass effect to the lower esophagus. Lungs/Pleura: Post  compressive atelectasis of the left lower lobe. Additionally, there are multiple areas of ground-glass opacity in bilateral lungs. An index lesion in the periphery of the right upper lobe measures 4.0 x 3.8 cm, image 42/[id number]. Upper Abdomen: Diffusely heterogeneous nodular appearance of the liver with lobulated contours. 1.3 cm in short axis lymph node in the gastrohepatic legs. Other small lymph nodes in porta hepatis. Musculoskeletal: No chest wall abnormality. No acute or significant osseous findings. IMPRESSION: Predominantly left-sided mediastinal, hilar and subcarinal lymphadenopathy. A 6 cm left hilar/subhilar mass may represent a primary malignancy versus a conglomerate of abnormal lymph nodes. It causes compression of the left lower lobe pulmonary arterial branches, left lower lobe main bronchus and the lower esophagus. Post compressive complete collapse of the left lower lobe. Multiple areas of ground-glass opacity in bilateral lungs. The index lesion in the periphery of the right upper lobe measures 4 cm. These may represent infectious or inflammatory consolidation, however bronchoalveolar carcinoma cannot be excluded. Diffusely heterogeneous nodular appearance of the liver. This may represent changes of cirrhosis or alternatively diffuse hepatic metastatic disease. Moderate lymphadenopathy in the upper abdomen. Bronchoscopy with tissue sampling may be considered to arrive to a histologic diagnosis. Aortic Atherosclerosis (ICD10-I70.0). Electronically Signed: By: Fidela Salisbury M.D. On: 07/06/2018 17:12   Dg Chest Port 1 View  Result Date: 07/10/2018 CLINICAL DATA:  Post bronchoscopy and left lung biopsy. Now with increased cough. EXAM: PORTABLE CHEST 1 VIEW COMPARISON:  Portable chest x-ray of July 08, 2018 FINDINGS: The right lung is well-expanded. Coarse lung markings in the right upper lobe persists. On the left there is patchy density at the left lung base which is stable. The interstitial  markings are slightly more in the left lung. There is no pneumothorax, pneumomediastinum, or pleural effusion. The heart and pulmonary vascularity are normal. IMPRESSION: There is no postprocedure complication following bronchoscopy on the left with lung biopsies. Electronically Signed   By: David  Martinique M.D.   On: 07/10/2018 12:45  Dg Chest Port 1 View  Result Date: 07/08/2018 CLINICAL DATA:  Rhonchi. EXAM: PORTABLE CHEST 1 VIEW COMPARISON:  None. FINDINGS: The heart size and mediastinal contours are within normal limits. No pneumothorax or pleural effusion is noted. Right lung is clear. Mild left basilar subsegmental atelectasis is noted. The visualized skeletal structures are unremarkable. IMPRESSION: Mild left basilar subsegmental atelectasis. Electronically Signed   By: Marijo Conception, M.D.   On: 07/08/2018 07:22   Dg Abd Acute W/chest  Result Date: 07/06/2018 CLINICAL DATA:  Nodule vomiting 2 months. EXAM: DG ABDOMEN ACUTE W/ 1V CHEST COMPARISON:  Chest x-ray 06/12/2017 FINDINGS: Lungs are adequately inflated with 1 cm nodule opacity over the lateral left midlung. Linear scarring lateral left base. Subtle hazy density over the right upper lobe. No effusion. Cardiac silhouette is within normal. Mild increased density over the AP window. Bowel gas pattern is nonobstructive. No free peritoneal air. Multiple surgical clips over the pelvis. Degenerate change of the spine and hips. Interbody fusion of L4-5. IMPRESSION: Subtle hazy density over the right upper lobe which may be due to acute infection. 1 cm nodular opacity over the lateral left midlung. Increased density and prominence of the AP window as cannot exclude mass/adenopathy. Consider contrast-enhanced chest CT for further evaluation. Nonobstructive bowel gas pattern. Electronically Signed   By: Marin Olp M.D.   On: 07/06/2018 14:43    ASSESSMENT: This is a very pleasant 62 years old white female recently diagnosed with extensive stage  (T3, N2, M1a)  small cell lung cancer diagnosed in August 2019 and presented with large left hilar/subhilar mass in addition to mediastinal lymphadenopathy and contracture and mass in the right upper lobe.   PLAN: I had a lengthy discussion with the patient and her husband today about her current disease stage, prognosis and treatment options. I recommended for the patient to complete the staging work-up by ordering a PET scan as well as MRI of the brain to rule out any other metastatic disease. I discussed with the patient her treatment options and she understands that she has incurable condition and all the treatment will be of palliative nature. She was given the option of palliative care and hospice referral versus consideration of palliative systemic chemotherapy with carboplatin for AUC of 5 on day 1, etoposide 100 mg/M2 on days 1, 2 and 3 and Tecentriq 1200 mg IV every 3 weeks.  This will also be supported with Neulasta injection. The patient is interested in proceeding with systemic chemotherapy. I discussed with her the adverse effect of this treatment including but not limited to alopecia, myelosuppression, nausea and vomiting, peripheral neuropathy, liver or renal dysfunction as well as the adverse effect of the immunotherapy. She is expected to start the first cycle of her treatment next week. I will arrange for the patient to have a chemotherapy education class before the first dose of her treatment. I will call her pharmacy with prescription for Compazine 10 mg p.o. every 6 hours as needed for nausea.  I will also start the patient on Tessalon for cough. She will come back for follow-up visit in 2 weeks for evaluation and management of any adverse effect of her treatment. She was advised to call immediately if she has any concerning symptoms in the interval. The patient voices understanding of current disease status and treatment options and is in agreement with the current care  plan.  All questions were answered. The patient knows to call the clinic with any problems, questions or concerns.  We can certainly see the patient much sooner if necessary.  Thank you so much for allowing me to participate in the care of [name]. I will continue to follow up the patient with you and assist in her care.  I spent 55 minutes counseling the patient face to face. The total time spent in the appointment was 80 minutes.  Disclaimer: This note was dictated with voice recognition software. Similar sounding words can inadvertently be transcribed and may not be corrected upon review.   Eilleen Kempf July 15, 2018, 1:25 PM

## 2018-07-15 NOTE — Telephone Encounter (Signed)
Dr. Annamaria Boots, please advise if this has been done already. Thanks!

## 2018-07-15 NOTE — Telephone Encounter (Signed)
Per patient's chart, patient is scheduled to see Dr. Julien Nordmann today. Will close this encounter.

## 2018-07-15 NOTE — Progress Notes (Signed)
START ON PATHWAY REGIMEN - Small Cell Lung     Cycles 1 through 4, every 21 days:     Atezolizumab      Carboplatin      Etoposide    Cycles 5 and beyond, every 21 days:     Atezolizumab   **Always confirm dose/schedule in your pharmacy ordering system**  Patient Characteristics: Extensive Stage, First Line Stage Classification: Extensive AJCC T Category: T3 AJCC N Category: N2 AJCC M Category: M1a AJCC 8 Stage Grouping: IVA Line of therapy: First Line  Intent of Therapy: Non-Curative / Palliative Intent, Discussed with Patient

## 2018-07-15 NOTE — Telephone Encounter (Signed)
This has been done and Mrs Poteet should have an oncology appointment being scheduled

## 2018-07-15 NOTE — Telephone Encounter (Signed)
Noted nothing further needed.

## 2018-07-16 ENCOUNTER — Ambulatory Visit (INDEPENDENT_AMBULATORY_CARE_PROVIDER_SITE_OTHER): Payer: Medicare Other | Admitting: Family Medicine

## 2018-07-16 ENCOUNTER — Encounter: Payer: Self-pay | Admitting: Family Medicine

## 2018-07-16 ENCOUNTER — Ambulatory Visit (INDEPENDENT_AMBULATORY_CARE_PROVIDER_SITE_OTHER): Payer: Medicare Other

## 2018-07-16 VITALS — BP 104/64 | HR 113 | Temp 97.8°F | Ht 65.0 in | Wt 166.0 lb

## 2018-07-16 DIAGNOSIS — E440 Moderate protein-calorie malnutrition: Secondary | ICD-10-CM

## 2018-07-16 DIAGNOSIS — C3492 Malignant neoplasm of unspecified part of left bronchus or lung: Secondary | ICD-10-CM | POA: Diagnosis not present

## 2018-07-16 DIAGNOSIS — R918 Other nonspecific abnormal finding of lung field: Secondary | ICD-10-CM | POA: Diagnosis not present

## 2018-07-16 DIAGNOSIS — D72829 Elevated white blood cell count, unspecified: Secondary | ICD-10-CM

## 2018-07-16 DIAGNOSIS — E871 Hypo-osmolality and hyponatremia: Secondary | ICD-10-CM | POA: Diagnosis not present

## 2018-07-16 DIAGNOSIS — Z09 Encounter for follow-up examination after completed treatment for conditions other than malignant neoplasm: Secondary | ICD-10-CM

## 2018-07-16 LAB — CMP14+EGFR
ALBUMIN: 3.1 g/dL — AB (ref 3.6–4.8)
ALK PHOS: 394 IU/L — AB (ref 39–117)
ALT: 41 IU/L — AB (ref 0–32)
AST: 126 IU/L — ABNORMAL HIGH (ref 0–40)
Albumin/Globulin Ratio: 0.9 — ABNORMAL LOW (ref 1.2–2.2)
BUN / CREAT RATIO: 16 (ref 12–28)
BUN: 8 mg/dL (ref 8–27)
Bilirubin Total: 0.8 mg/dL (ref 0.0–1.2)
CHLORIDE: 83 mmol/L — AB (ref 96–106)
CO2: 25 mmol/L (ref 20–29)
CREATININE: 0.5 mg/dL — AB (ref 0.57–1.00)
Calcium: 8.8 mg/dL (ref 8.7–10.3)
GFR calc Af Amer: 122 mL/min/{1.73_m2} (ref >59)
GFR calc non Af Amer: 106 mL/min/{1.73_m2} (ref >59)
GLUCOSE: 96 mg/dL (ref 65–99)
Globulin, Total: 3.4 g/dL (ref 1.5–4.5)
Potassium: 4 mmol/L (ref 3.5–5.2)
Sodium: 124 mmol/L — ABNORMAL LOW (ref 134–144)
Total Protein: 6.5 g/dL (ref 6.0–8.5)

## 2018-07-16 LAB — CBC WITH DIFFERENTIAL/PLATELET
Basophils Absolute: 0.1 10*3/uL (ref 0.0–0.2)
Basos: 0 %
EOS (ABSOLUTE): 0 10*3/uL (ref 0.0–0.4)
Eos: 0 %
Hematocrit: 39.9 % (ref 34.0–46.6)
Hemoglobin: 13 g/dL (ref 11.1–15.9)
IMMATURE GRANS (ABS): 0.1 10*3/uL (ref 0.0–0.1)
Immature Granulocytes: 1 %
LYMPHS: 11 %
Lymphocytes Absolute: 2.2 10*3/uL (ref 0.7–3.1)
MCH: 27.7 pg (ref 26.6–33.0)
MCHC: 32.6 g/dL (ref 31.5–35.7)
MCV: 85 fL (ref 79–97)
MONOCYTES: 9 %
Monocytes Absolute: 1.8 10*3/uL — ABNORMAL HIGH (ref 0.1–0.9)
Neutrophils Absolute: 15.5 10*3/uL — ABNORMAL HIGH (ref 1.4–7.0)
Neutrophils: 79 %
PLATELETS: 473 10*3/uL — AB (ref 150–450)
RBC: 4.69 x10E6/uL (ref 3.77–5.28)
RDW: 15.7 % — ABNORMAL HIGH (ref 12.3–15.4)
WBC: 19.7 10*3/uL — AB (ref 3.4–10.8)

## 2018-07-16 MED ORDER — DOXYCYCLINE HYCLATE 100 MG PO TABS: 100.0000 mg | ORAL_TABLET | Freq: Two times a day (BID) | ORAL | 0 refills | Status: DC

## 2018-07-16 NOTE — Progress Notes (Signed)
Subjective: CC: hospital follow up PCP: [name], MD CHE:NIDPOEU [name] is a 62 y.o. female presenting to clinic today for:  1. Hospital follow up Patient was seen a week and a half ago for altered taste unplanned weight loss.  She had basic labs performed and was noted to have hyponatremia.  She was sent to the emergency department and ultimately admitted for hyponatremia.  She was given IV fluids and salt tablets and sodium improved.  Her Prozac and chlorthalidone were discontinued at time of discharge.  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  She was seen by oncology yesterday and had repeat labs.  Labs were notable for hyponatremia to 128, of note her sodium level was 134 on 07/10/2018.  She had a rising alk phos to 364 from 243 at time of discharge.  She also had an elevation in her white blood cell count of 16.9 from 6.8 at time of discharge.  Patient reports dyspnea on exertion, intermittent subjective fevers.  Denies vomiting.  She reports poor appetite.  She has been using inhalers twice daily with some relief.  She notes persistent cough and had a medication sent in to pharmacy for this by her Oncologist.   ROS: Per HPI  Allergies  Allergen Reactions  . Penicillins Nausea And Vomiting    Has patient had a PCN reaction causing immediate rash, facial/tongue/throat swelling, SOB or lightheadedness with hypotension: Unknown Has patient had a PCN reaction causing severe rash involving mucus membranes or skin necrosis: Unknown Has patient had a PCN reaction that required hospitalization: Unknown Has patient had a PCN reaction occurring within the last 10 years: Unknown If all of the above answers are "NO", then may proceed with Cephalosporin use.   Marland Kitchen Actonel [Risedronate Sodium]     Caused her to retain fluid and stomach problems  . Advair Diskus [Fluticasone-Salmeterol]   . Alendronate  Sodium     Stomach upset / esophageal burning  . Aspirin Nausea Only    Stomach burns; abdominal pain  . Azithromycin Nausea And Vomiting  . Levofloxacin     Reports hx of GI distress and GI bleed  . Morphine Itching and Swelling    Throat swells  . Nsaids     Swelling, GI upset   . Omnicef [Cefdinir]   . Erythromycin Rash  . Iodine Rash    Topical and IV   Past Medical History:  Diagnosis Date  . Alpha-1-antitrypsin deficiency ([place])   . Cervical cancer ([place]) 2004  . 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 This doesn't fit entirely with emphysema.      . Exposure to hepatitis C 09/12/2016  . Fibromyalgia   . GERD (gastroesophageal reflux disease)   . Hyperlipidemia   . IBS (irritable bowel syndrome)   . Obstructive sleep apnea 06/02/2008   NPSG 06/07/08, AHI 6.9/ hr, weight 220 lbs    . Osteoporosis   . Pulmonary fibrosis, unspecified ([place]) 09/29/2016   CXR 03/28/2016  . Rhinitis, nonallergic 05/08/2012  . 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 Vitamin D deficiency     Current Outpatient Medications:  .  albuterol (PROVENTIL) (2.5 MG/3ML) 0.083% nebulizer solution, Take 3 mLs (2.5 mg total) by  nebulization every 6 (six) hours as needed. (Patient not taking: Reported on 07/15/2018), Disp: 90 mL, Rfl: 3 .  amitriptyline (ELAVIL) 75 MG tablet, Take 75 mg by mouth at bedtime.  , Disp: , Rfl:  .  benzonatate (TESSALON) 100 MG capsule, Take 1 capsule (100 mg total) by mouth 3 (three) times daily as needed for cough., Disp: 20 capsule, Rfl: 0 .  BEVESPI AEROSPHERE 9-4.8 MCG/ACT AERO, 2 PUFFS 2 TIMES A DAY, Disp: 10.7 g, Rfl: 0 .  ezetimibe (ZETIA) 10 MG tablet, TAKE 1 TABLET ONCE A DAY, Disp: 90 tablet, Rfl: 1 .  feeding supplement, ENSURE ENLIVE, (ENSURE ENLIVE) LIQD, Take 237 mLs  by mouth 2 (two) times daily between meals., Disp: 60 Bottle, Rfl: 0 .  fluticasone (FLONASE) 50 MCG/ACT nasal spray, USE 2 SPRAYS IN EACH NOSTRIL ONCE DAILY., Disp: 16 g, Rfl: 5 .  folic acid (FOLVITE) 1 MG tablet, Take 1 mg by mouth daily., Disp: , Rfl:  .  guaiFENesin (MUCINEX) 600 MG 12 hr tablet, Take 1,200 mg by mouth 2 (two) times daily.  , Disp: , Rfl:  .  HYDROcodone-acetaminophen (NORCO/VICODIN) 5-325 MG tablet, Take 1 tablet by mouth 3 (three) times daily., Disp: , Rfl:  .  Nebulizers (COMPRESSOR NEBULIZER) MISC, 1 Device by Does not apply route as needed., Disp: 1 each, Rfl: prn .  omeprazole (PRILOSEC) 40 MG capsule, TAKE (1) CAPSULE DAILY, Disp: 90 capsule, Rfl: 1 .  PROAIR HFA 108 (90 Base) MCG/ACT inhaler, 2 PUFFS EVERY 4 HOURS AS NEEDED FOR WHEEZING, Disp: 8.5 g, Rfl: 2 .  prochlorperazine (COMPAZINE) 10 MG tablet, Take 1 tablet (10 mg total) by mouth every 6 (six) hours as needed for nausea or vomiting., Disp: 30 tablet, Rfl: 0 .  rosuvastatin (CRESTOR) 40 MG tablet, TAKE 1/2 TABLET ONCE DAILY, Disp: 45 tablet, Rfl: 1 Social History   Socioeconomic History  . Marital status: Married    Spouse name: Audry Pili  . Number of children: 2  . Years of education: Not on file  . Highest education level: Not on file  Occupational History  . Occupation: disabled    Fish farm manager: UNEMPLOYED  Social Needs  . Financial resource strain: Not hard at all  . Food insecurity:    Worry: Never true    Inability: Never true  . Transportation needs:    Medical: No    Non-medical: No  Tobacco Use  . Smoking status: Former Smoker    Packs/day: 0.50    Types: Cigarettes    Start date: 11/20/1972    Last attempt to quit: 04/01/2012    Years since quitting: 6.2  . Smokeless tobacco: Never Used  Substance and Sexual Activity  . Alcohol use: No  . Drug use: No  . Sexual activity: Yes  Lifestyle  . Physical activity:    Days per week: 5 days    Minutes per session: 30 min  . Stress: Not at all    Relationships  . Social connections:    Talks on phone: More than three times a week    Gets together: More than three times a week    Attends religious service: More than 4 times per year    Active member of club or organization: No    Attends meetings of clubs or organizations: Never    Relationship status: Not on file  . Intimate partner violence:    Fear of current or ex partner: No    Emotionally abused: No    Physically  abused: No    Forced sexual activity: No  Other Topics Concern  . Not on file  Social History Narrative   Lives at home with husband.    Family History  Problem Relation Age of Onset  . Alpha-1 antitrypsin deficiency Sister   . Osteoporosis Sister   . Pancreatic cancer Mother 4  . Osteoporosis Mother   . Irregular heart beat Mother   . Heart attack Father 31       Died age 36  . Migraines Father   . CAD Sister 68       Died age 69  . Osteoporosis Sister   . Lung cancer Brother   . CAD Brother 18       CABG.  Died from leukemia  . Leukemia Brother   . Heart attack Brother   . Alpha-1 antitrypsin deficiency Other   . Heart disease Brother   . Colon cancer Neg Hx     Objective: Office vital signs reviewed. BP 104/64 (BP Location: Left Arm, Patient Position: Sitting, Cuff Size: Normal)   Pulse (!) 113   Temp 97.8 F (36.6 C) (Oral)   Ht '5\' 5"'$  (1.651 m)   Wt 166 lb (75.3 kg)   SpO2 (!) 88%   BMI 27.62 kg/m   Physical Examination:  General: Awake, alert, chronically ill appearing, No acute distress Cardio: regular rate and rhythm, S1S2 heard, no murmurs appreciated Pulm: Decreased breath sounds in left lower lung fields.  Otherwise, clear to auscultation bilaterally, no wheezes, rhonchi or rales; she has normal work of breathing on room air but does become somewhat dyspneic on exertion. MSK: Normal gait and normal station Skin: dry; intact; mild bruising appreciated in the left cubital fossa. Neuro: Follows all commands.  No focal  neurologic deficits.  Dg Chest 2 View  Result Date: 07/16/2018 CLINICAL DATA:  Elevated white blood cell count. Recently diagnosed with left-sided small cell lung malignancy. History of COPD, former smoker. EXAM: CHEST - 2 VIEW COMPARISON:  Portable chest x-ray of July 10, 2018. Chest CT scan of July 06, 2018. FINDINGS: There is progressive increased interstitial and airspace opacity in the left lower lobe. A small amount of increased density in the left upper lobe is stable since the previous CT scan. The right lung is mildly hyperinflated and clear. A small left pleural effusion is suspected. The heart and pulmonary vascularity are normal. There is calcification in the wall of the aortic arch. The bony thorax exhibits no acute abnormality. IMPRESSION: Progressive interstitial and airspace opacities in the left lower lobe worrisome for pneumonia. This may reflect a post obstructive process given the significant left-sided mediastinal lymphadenopathy described on the CT scan of July 06, 2018. A small amount of increased density in the left upper lobe may reflect atelectasis or pneumonia. Underlying COPD-smoking related changes. Thoracic aortic atherosclerosis. Electronically Signed   By: David  Martinique M.D.   On: 07/16/2018 09:31   Assessment/ Plan: 62 y.o. female   1. SCLC (small cell lung carcinoma), left ([place]) Patient with 88% oxygenation on room air.  I did recommend that she increase her albuterol to every 4-6 hours.  Continue Bevespi as directed.  I reviewed her hospital course, discharge summary and recent oncology visit.  Her labs from yesterday's oncology visit demonstrated a decline in her sodium level.  She also had a rise in her white blood cell count.  I am concerned for possible superimposed pneumonia.  Chest x-ray does demonstrate left lower lobe opacity on my  read.  However, difficult to discern whether or not this is her known small cell lung carcinoma or infiltrate.  Radiologist  notes that there does appear to be an increased density in the left upper lobe that may be reflective of pneumonia.  Ideally, I would like to treat her with Levaquin but she reports that she is very intolerant to the medication.  Therefore, doxycycline prescribed empirically.  I discussed with the patient and her husband that she may need treatment with IV antibiotics in the hospital for this given recent hospitalization.  However, they will see if symptoms improve over the next 1 to 2 days with outpatient oral antibiotics.  If no improvement, I highly recommended that he seek immediate medical care in the emergency department given pulmonary impairment.  Will repeat BMP and CBC.  Reasons for emergent evaluation the emergency department discussed. - CBC with Differential  2. Hospital discharge follow-up - CBC with Differential - CMP14+EGFR  3. Hyponatremia - CMP14+EGFR  4. Malnutrition of moderate degree - CMP14+EGFR  5. Leukocytosis, unspecified type - DG Chest 2 View; Future   Orders Placed This Encounter  Procedures  . DG Chest 2 View    Standing Status:   Future    Number of Occurrences:   1    Standing Expiration Date:   09/16/2019    Order Specific Question:   Reason for Exam (SYMPTOM  OR DIAGNOSIS REQUIRED)    Answer:   leukocytosis.  recent hospitalization found to have L sided small cell ca    Order Specific Question:   Is patient pregnant?    Answer:   No    Order Specific Question:   Preferred imaging location?    Answer:   Internal    Order Specific Question:   Radiology Contrast Protocol - do NOT remove file path    Answer:   [email or web address]  . CBC with Differential  . CMP14+EGFR   Meds ordered this encounter  Medications  . doxycycline (VIBRA-TABS) 100 MG tablet    Sig: Take 1 tablet (100 mg total) by mouth 2 (two) times daily for 7 days.    Dispense:  14 tablet    Refill:  [place], DO [place] [phone]

## 2018-07-16 NOTE — Patient Instructions (Signed)
You had labs performed today.  You will be contacted with the results of the labs once they are available, usually in the next 3 business days for routine lab work.    Hyponatremia Hyponatremia is when the amount of salt (sodium) in your blood is too low. When salt levels are low, your cells absorb extra water and they swell. The swelling happens throughout the body, but it mostly affects the brain. Follow these instructions at home:  Take medicines only as told by your doctor. Many medicines can make this condition worse. Talk with your doctor about any medicines that you are currently taking.  Carefully follow a recommended diet as told by your doctor.  Carefully follow instructions from your doctor about fluid restrictions.  Keep all follow-up visits as told by your doctor. This is important.  Do not drink alcohol. Contact a doctor if:  You feel sicker to your stomach (nauseous).  You feel more confused.  You feel more tired (fatigued).  Your headache gets worse.  You feel weaker.  Your symptoms go away and then they come back.  You have trouble following the diet instructions. Get help right away if:  You start to twitch and shake (have a seizure).  You pass out (faint).  You keep having watery poop (diarrhea).  You keep throwing up (vomiting). This information is not intended to replace advice given to you by your health care provider. Make sure you discuss any questions you have with your health care provider. Document Released: 07/19/2011 Document Revised: 04/13/2016 Document Reviewed: 11/02/2014 Elsevier Interactive Patient Education  [employer].

## 2018-07-17 ENCOUNTER — Encounter (HOSPITAL_COMMUNITY): Payer: Self-pay | Admitting: Emergency Medicine

## 2018-07-17 ENCOUNTER — Inpatient Hospital Stay (HOSPITAL_COMMUNITY)
Admission: EM | Admit: 2018-07-17 | Discharge: 2018-07-19 | DRG: 190 | Disposition: A | Discharge status: Home Health | Payer: Medicare Other | Attending: Internal Medicine | Admitting: Internal Medicine

## 2018-07-17 ENCOUNTER — Emergency Department (HOSPITAL_COMMUNITY): Payer: Medicare Other

## 2018-07-17 ENCOUNTER — Other Ambulatory Visit: Payer: Self-pay

## 2018-07-17 DIAGNOSIS — R0902 Hypoxemia: Secondary | ICD-10-CM | POA: Diagnosis not present

## 2018-07-17 DIAGNOSIS — Z880 Allergy status to penicillin: Secondary | ICD-10-CM

## 2018-07-17 DIAGNOSIS — Z90710 Acquired absence of both cervix and uterus: Secondary | ICD-10-CM | POA: Diagnosis not present

## 2018-07-17 DIAGNOSIS — C3492 Malignant neoplasm of unspecified part of left bronchus or lung: Secondary | ICD-10-CM | POA: Diagnosis not present

## 2018-07-17 DIAGNOSIS — Z8541 Personal history of malignant neoplasm of cervix uteri: Secondary | ICD-10-CM | POA: Diagnosis not present

## 2018-07-17 DIAGNOSIS — K589 Irritable bowel syndrome without diarrhea: Secondary | ICD-10-CM | POA: Diagnosis present

## 2018-07-17 DIAGNOSIS — J310 Chronic rhinitis: Secondary | ICD-10-CM | POA: Diagnosis not present

## 2018-07-17 DIAGNOSIS — Z885 Allergy status to narcotic agent status: Secondary | ICD-10-CM

## 2018-07-17 DIAGNOSIS — E440 Moderate protein-calorie malnutrition: Secondary | ICD-10-CM | POA: Diagnosis not present

## 2018-07-17 DIAGNOSIS — K219 Gastro-esophageal reflux disease without esophagitis: Secondary | ICD-10-CM | POA: Diagnosis not present

## 2018-07-17 DIAGNOSIS — R945 Abnormal results of liver function studies: Secondary | ICD-10-CM

## 2018-07-17 DIAGNOSIS — G4733 Obstructive sleep apnea (adult) (pediatric): Secondary | ICD-10-CM | POA: Diagnosis not present

## 2018-07-17 DIAGNOSIS — I10 Essential (primary) hypertension: Secondary | ICD-10-CM | POA: Diagnosis not present

## 2018-07-17 DIAGNOSIS — J440 Chronic obstructive pulmonary disease with acute lower respiratory infection: Secondary | ICD-10-CM | POA: Diagnosis not present

## 2018-07-17 DIAGNOSIS — Z888 Allergy status to other drugs, medicaments and biological substances status: Secondary | ICD-10-CM

## 2018-07-17 DIAGNOSIS — R0602 Shortness of breath: Secondary | ICD-10-CM | POA: Diagnosis not present

## 2018-07-17 DIAGNOSIS — M797 Fibromyalgia: Secondary | ICD-10-CM | POA: Diagnosis not present

## 2018-07-17 DIAGNOSIS — Z8262 Family history of osteoporosis: Secondary | ICD-10-CM

## 2018-07-17 DIAGNOSIS — J181 Lobar pneumonia, unspecified organism: Secondary | ICD-10-CM | POA: Diagnosis present

## 2018-07-17 DIAGNOSIS — Z801 Family history of malignant neoplasm of trachea, bronchus and lung: Secondary | ICD-10-CM

## 2018-07-17 DIAGNOSIS — E876 Hypokalemia: Secondary | ICD-10-CM | POA: Diagnosis not present

## 2018-07-17 DIAGNOSIS — J189 Pneumonia, unspecified organism: Secondary | ICD-10-CM | POA: Diagnosis not present

## 2018-07-17 DIAGNOSIS — Z87891 Personal history of nicotine dependence: Secondary | ICD-10-CM

## 2018-07-17 DIAGNOSIS — R7989 Other specified abnormal findings of blood chemistry: Secondary | ICD-10-CM | POA: Diagnosis present

## 2018-07-17 DIAGNOSIS — Z8249 Family history of ischemic heart disease and other diseases of the circulatory system: Secondary | ICD-10-CM

## 2018-07-17 DIAGNOSIS — E8801 Alpha-1-antitrypsin deficiency: Secondary | ICD-10-CM | POA: Diagnosis present

## 2018-07-17 DIAGNOSIS — E785 Hyperlipidemia, unspecified: Secondary | ICD-10-CM | POA: Diagnosis not present

## 2018-07-17 DIAGNOSIS — Z7951 Long term (current) use of inhaled steroids: Secondary | ICD-10-CM

## 2018-07-17 DIAGNOSIS — Y95 Nosocomial condition: Secondary | ICD-10-CM | POA: Diagnosis present

## 2018-07-17 DIAGNOSIS — Z6827 Body mass index (BMI) 27.0-27.9, adult: Secondary | ICD-10-CM

## 2018-07-17 DIAGNOSIS — C787 Secondary malignant neoplasm of liver and intrahepatic bile duct: Secondary | ICD-10-CM | POA: Diagnosis present

## 2018-07-17 DIAGNOSIS — M810 Age-related osteoporosis without current pathological fracture: Secondary | ICD-10-CM | POA: Diagnosis present

## 2018-07-17 DIAGNOSIS — E871 Hypo-osmolality and hyponatremia: Secondary | ICD-10-CM

## 2018-07-17 DIAGNOSIS — J8410 Pulmonary fibrosis, unspecified: Secondary | ICD-10-CM | POA: Diagnosis not present

## 2018-07-17 DIAGNOSIS — R918 Other nonspecific abnormal finding of lung field: Secondary | ICD-10-CM | POA: Diagnosis not present

## 2018-07-17 DIAGNOSIS — D72829 Elevated white blood cell count, unspecified: Secondary | ICD-10-CM | POA: Diagnosis not present

## 2018-07-17 DIAGNOSIS — Z91041 Radiographic dye allergy status: Secondary | ICD-10-CM

## 2018-07-17 DIAGNOSIS — Z79899 Other long term (current) drug therapy: Secondary | ICD-10-CM

## 2018-07-17 DIAGNOSIS — Z881 Allergy status to other antibiotic agents status: Secondary | ICD-10-CM

## 2018-07-17 DIAGNOSIS — Z886 Allergy status to analgesic agent status: Secondary | ICD-10-CM

## 2018-07-17 LAB — PROCALCITONIN: PROCALCITONIN: 1.97 ng/mL

## 2018-07-17 LAB — CBC WITH DIFFERENTIAL/PLATELET
BASOS ABS: 0 10*3/uL (ref 0.0–0.1)
BASOS PCT: 0 %
Eosinophils Absolute: 0 10*3/uL (ref 0.0–0.7)
Eosinophils Relative: 0 %
HCT: 36.9 % (ref 36.0–46.0)
Hemoglobin: 13 g/dL (ref 12.0–15.0)
LYMPHS PCT: 10 %
Lymphs Abs: 2.3 10*3/uL (ref 0.7–4.0)
MCH: 29.2 pg (ref 26.0–34.0)
MCHC: 35.2 g/dL (ref 30.0–36.0)
MCV: 82.9 fL (ref 78.0–100.0)
MONO ABS: 2.3 10*3/uL — AB (ref 0.1–1.0)
Monocytes Relative: 11 %
NEUTROS PCT: 79 %
Neutro Abs: 17.4 10*3/uL — ABNORMAL HIGH (ref 1.7–7.7)
Platelets: 534 10*3/uL — ABNORMAL HIGH (ref 150–400)
RBC: 4.45 MIL/uL (ref 3.87–5.11)
RDW: 17.2 % — AB (ref 11.5–15.5)
WBC: 22 10*3/uL — AB (ref 4.0–10.5)

## 2018-07-17 LAB — INFLUENZA PANEL BY PCR (TYPE A & B)
Influenza A By PCR: NEGATIVE
Influenza B By PCR: NEGATIVE

## 2018-07-17 LAB — COMPREHENSIVE METABOLIC PANEL
ALBUMIN: 2.6 g/dL — AB (ref 3.5–5.0)
ALT: 47 U/L — AB (ref 0–44)
AST: 126 U/L — AB (ref 15–41)
Alkaline Phosphatase: 361 U/L — ABNORMAL HIGH (ref 38–126)
Anion gap: 15 (ref 5–15)
BILIRUBIN TOTAL: 1.2 mg/dL (ref 0.3–1.2)
BUN: 10 mg/dL (ref 6–20)
CHLORIDE: 88 mmol/L — AB (ref 98–111)
CO2: 26 mmol/L (ref 22–32)
CREATININE: 0.6 mg/dL (ref 0.44–1.00)
Calcium: 9.1 mg/dL (ref 8.9–10.3)
GFR calc Af Amer: >60 mL/min (ref >60)
GLUCOSE: 101 mg/dL — AB (ref 70–99)
POTASSIUM: 3.6 mmol/L (ref 3.5–5.1)
Sodium: 129 mmol/L — ABNORMAL LOW (ref 135–145)
Total Protein: 7 g/dL (ref 6.5–8.1)

## 2018-07-17 LAB — I-STAT CG4 LACTIC ACID, ED: Lactic Acid, Venous: 1.64 mmol/L (ref 0.5–1.9)

## 2018-07-17 LAB — MRSA PCR SCREENING: MRSA by PCR: NEGATIVE

## 2018-07-17 MED ORDER — SODIUM CHLORIDE 0.9 % IV SOLN: 2.0000 g | Freq: Once | INTRAVENOUS | Status: DC

## 2018-07-17 MED ORDER — VANCOMYCIN HCL 10 G IV SOLR
1500.0000 mg | Freq: Once | INTRAVENOUS | Status: AC
  Administered 2018-07-17: 1500 mg via INTRAVENOUS
  Filled 2018-07-17: qty 1500

## 2018-07-17 MED ORDER — ALBUTEROL SULFATE (2.5 MG/3ML) 0.083% IN NEBU
2.5000 mg | INHALATION_SOLUTION | RESPIRATORY_TRACT | Status: DC | PRN
  Administered 2018-07-17: 2.5 mg via RESPIRATORY_TRACT
  Filled 2018-07-17: qty 3

## 2018-07-17 MED ORDER — PANTOPRAZOLE SODIUM 40 MG PO TBEC
40.0000 mg | DELAYED_RELEASE_TABLET | Freq: Every day | ORAL | Status: DC
  Administered 2018-07-18 – 2018-07-19 (×2): 40 mg via ORAL
  Filled 2018-07-17 (×2): qty 1

## 2018-07-17 MED ORDER — ONDANSETRON HCL 4 MG PO TABS: 4.0000 mg | ORAL_TABLET | Freq: Four times a day (QID) | ORAL | Status: DC | PRN

## 2018-07-17 MED ORDER — HYDROCODONE-ACETAMINOPHEN 5-325 MG PO TABS
1.0000 | ORAL_TABLET | Freq: Three times a day (TID) | ORAL | Status: DC
  Administered 2018-07-17 – 2018-07-19 (×5): 1 via ORAL
  Filled 2018-07-17 (×5): qty 1

## 2018-07-17 MED ORDER — ACETAMINOPHEN 325 MG PO TABS: 650.0000 mg | ORAL_TABLET | Freq: Four times a day (QID) | ORAL | Status: DC | PRN

## 2018-07-17 MED ORDER — SODIUM CHLORIDE 0.9 % IV BOLUS
1000.0000 mL | Freq: Once | INTRAVENOUS | Status: AC
  Administered 2018-07-17: 1000 mL via INTRAVENOUS

## 2018-07-17 MED ORDER — ROSUVASTATIN CALCIUM 20 MG PO TABS
20.0000 mg | ORAL_TABLET | Freq: Every day | ORAL | Status: DC
  Administered 2018-07-18: 20 mg via ORAL
  Filled 2018-07-17: qty 1

## 2018-07-17 MED ORDER — FLUTICASONE PROPIONATE 50 MCG/ACT NA SUSP
2.0000 | Freq: Every day | NASAL | Status: DC
  Administered 2018-07-18: 2 via NASAL
  Filled 2018-07-17: qty 16

## 2018-07-17 MED ORDER — ONDANSETRON HCL 4 MG/2ML IJ SOLN: 4.0000 mg | Freq: Four times a day (QID) | INTRAMUSCULAR | Status: DC | PRN

## 2018-07-17 MED ORDER — ENOXAPARIN SODIUM 40 MG/0.4ML ~~LOC~~ SOLN
40.0000 mg | SUBCUTANEOUS | Status: DC
  Administered 2018-07-17 – 2018-07-18 (×2): 40 mg via SUBCUTANEOUS
  Filled 2018-07-17 (×2): qty 0.4

## 2018-07-17 MED ORDER — VANCOMYCIN HCL IN DEXTROSE 750-5 MG/150ML-% IV SOLN
750.0000 mg | Freq: Two times a day (BID) | INTRAVENOUS | Status: DC
  Administered 2018-07-18: 750 mg via INTRAVENOUS
  Filled 2018-07-17: qty 150

## 2018-07-17 MED ORDER — AMITRIPTYLINE HCL 25 MG PO TABS
75.0000 mg | ORAL_TABLET | Freq: Every day | ORAL | Status: DC
  Administered 2018-07-17 – 2018-07-18 (×2): 75 mg via ORAL
  Filled 2018-07-17 (×2): qty 3

## 2018-07-17 MED ORDER — SODIUM CHLORIDE 0.9 % IV SOLN
2.0000 g | Freq: Once | INTRAVENOUS | Status: AC
  Administered 2018-07-17: 2 g via INTRAVENOUS
  Filled 2018-07-17: qty 2

## 2018-07-17 MED ORDER — FOLIC ACID 1 MG PO TABS
1.0000 mg | ORAL_TABLET | Freq: Every day | ORAL | Status: DC
  Administered 2018-07-18 – 2018-07-19 (×2): 1 mg via ORAL
  Filled 2018-07-17 (×2): qty 1

## 2018-07-17 MED ORDER — SODIUM CHLORIDE 0.9 % IV SOLN
1.0000 g | Freq: Three times a day (TID) | INTRAVENOUS | Status: DC
  Administered 2018-07-18 – 2018-07-19 (×4): 1 g via INTRAVENOUS
  Filled 2018-07-17 (×5): qty 1

## 2018-07-17 MED ORDER — ACETAMINOPHEN 650 MG RE SUPP: 650.0000 mg | Freq: Four times a day (QID) | RECTAL | Status: DC | PRN

## 2018-07-17 MED ORDER — BENZONATATE 100 MG PO CAPS
100.0000 mg | ORAL_CAPSULE | Freq: Three times a day (TID) | ORAL | Status: DC | PRN
  Administered 2018-07-17: 100 mg via ORAL
  Filled 2018-07-17: qty 1

## 2018-07-17 MED ORDER — MOMETASONE FURO-FORMOTEROL FUM 100-5 MCG/ACT IN AERO
2.0000 | INHALATION_SPRAY | Freq: Two times a day (BID) | RESPIRATORY_TRACT | Status: DC
  Filled 2018-07-17: qty 8.8

## 2018-07-17 MED ORDER — ALBUTEROL SULFATE (2.5 MG/3ML) 0.083% IN NEBU
2.5000 mg | INHALATION_SOLUTION | Freq: Three times a day (TID) | RESPIRATORY_TRACT | Status: DC
  Administered 2018-07-18 – 2018-07-19 (×4): 2.5 mg via RESPIRATORY_TRACT
  Filled 2018-07-17 (×4): qty 3

## 2018-07-17 MED ORDER — EZETIMIBE 10 MG PO TABS
10.0000 mg | ORAL_TABLET | Freq: Every day | ORAL | Status: DC
  Administered 2018-07-18 – 2018-07-19 (×2): 10 mg via ORAL
  Filled 2018-07-17 (×2): qty 1

## 2018-07-17 MED ORDER — ENSURE ENLIVE PO LIQD
237.0000 mL | Freq: Two times a day (BID) | ORAL | Status: DC
  Administered 2018-07-18: 237 mL via ORAL

## 2018-07-17 NOTE — H&P (Signed)
History and Physical    [name] NFA:[id number] DOB: 07-07-1956 DOA: 07/17/2018  PCP: [name], MD   Patient coming from: Home  I have personally briefly reviewed patient's old medical records in [place]  Chief Complaint: Cough and shortness of breath  HPI: [name] is a 62 y.o. female with medical history significant of recent diagnosis of small cell lung cancer diagnosed from recent bronchoscopy and pathology done on 07/10/2018 for which patient saw Dr. Julien Nordmann on 07/15/2018 and was supposed to be started on chemotherapy on 07/24/2018; COPD, alpha-1 antitrypsin deficiency, pulmonary fibrosis, fibromyalgia, dyslipidemia, GERD, irritable bowel syndrome, sleep apnea, vitamin D deficiency, cervical cancer 14 years ago status post resection and recent admission on 07/06/2018 and discharged on 07/10/2018 for electrolyte abnormalities and left lung mass status post bronchoscopy and biopsy presented today for worsening shortness of breath cough and chills.  Patient is having worsening shortness of breath with cough and chills for the last few days with whitish/yellowish sputum production and intermittent chest discomfort.  She was put on oral doxycycline a few days ago by her primary care provider for probable pneumonia and had a chest x-ray done yesterday by her primary care doctor which showed worsening left lower lobe pneumonia and patient was advised to come to the emergency room.  She does not use oxygen at home patient denies nausea, vomiting, abdominal pain, diarrhea, dysuria, loss of consciousness, seizures.  ED Course: Patient had a repeat chest x-ray done.  She required supplemental oxygen in the ED.  She was started on broad-spectrum antibiotics.  Hospitalist service was called to evaluate the patient.  Review of Systems: As per HPI otherwise 10 point review of systems negative.    Past Medical History:  Diagnosis Date  . Alpha-1-antitrypsin deficiency ([place])   .  Cervical cancer ([place]) 2004  . COPD mixed type ([place]) 09/14/2007   PFT- 05/06/2013-reduced FVC may reflect effort but the overall pattern is moderate obstructive airways disease with response to bronchodilator, air trapping, normal diffusion. This doesn't fit entirely with emphysema.      . Exposure to hepatitis C 09/12/2016  . Fibromyalgia   . GERD (gastroesophageal reflux disease)   . Hyperlipidemia   . IBS (irritable bowel syndrome)   . Obstructive sleep apnea 06/02/2008   NPSG 06/07/08, AHI 6.9/ hr, weight 220 lbs    . Osteoporosis   . Pulmonary fibrosis, unspecified ([place]) 09/29/2016   CXR 03/28/2016  . Rhinitis, nonallergic 05/08/2012  . 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 Vitamin D deficiency     Past Surgical History:  Procedure Laterality Date  . ABDOMINAL HYSTERECTOMY    . LUMBAR DISC SURGERY    . VIDEO BRONCHOSCOPY Bilateral 07/10/2018   Procedure: VIDEO BRONCHOSCOPY WITHOUT FLUORO;  Surgeon: Laurin Coder, MD;  Location: [facility];  Service: Cardiopulmonary;  Laterality: Bilateral;   Social history  reports that she quit smoking about 6 years ago. Her smoking use included cigarettes. She started smoking about 45 years ago. She smoked 0.50 packs per day. She has never used smokeless tobacco. She reports that she does not drink alcohol or use drugs.  Lives at home with family  Allergies  Allergen Reactions  . Penicillins Nausea And Vomiting    Has patient had a PCN reaction causing immediate rash, facial/tongue/throat swelling, SOB or lightheadedness with hypotension: Unknown Has patient had a PCN reaction causing severe rash involving mucus membranes or skin necrosis:  Unknown Has patient had a PCN reaction that required hospitalization: Unknown Has patient had a PCN reaction occurring within the last 10 years: Unknown If all of the above answers are "NO", then may proceed with Cephalosporin use.   Marland Kitchen Actonel [Risedronate Sodium]       Caused her to retain fluid and stomach problems  . Advair Diskus [Fluticasone-Salmeterol]   . Alendronate Sodium     Stomach upset / esophageal burning  . Aspirin Nausea Only    Stomach burns; abdominal pain  . Azithromycin Nausea And Vomiting  . Levofloxacin     Reports hx of GI distress and GI bleed  . Morphine Itching and Swelling    Throat swells  . Nsaids     Swelling, GI upset   . Omnicef [Cefdinir]   . Erythromycin Rash  . Iodine Rash    Topical and IV    Family History  Problem Relation Age of Onset  . Alpha-1 antitrypsin deficiency Sister   . Osteoporosis Sister   . Pancreatic cancer Mother 61  . Osteoporosis Mother   . Irregular heart beat Mother   . Heart attack Father 35       Died age 48  . Migraines Father   . CAD Sister 57       Died age 80  . Osteoporosis Sister   . Lung cancer Brother   . CAD Brother 48       CABG.  Died from leukemia  . Leukemia Brother   . Heart attack Brother   . Alpha-1 antitrypsin deficiency Other   . Heart disease Brother   . Colon cancer Neg Hx     Prior to Admission medications   Medication Sig Start Date End Date Taking? Authorizing Provider  albuterol (PROVENTIL) (2.5 MG/3ML) 0.083% nebulizer solution Take 3 mLs (2.5 mg total) by nebulization every 6 (six) hours as needed. 06/07/17  Yes [name], MD  amitriptyline (ELAVIL) 75 MG tablet Take 75 mg by mouth at bedtime.     Yes [provider]  benzonatate (TESSALON) 100 MG capsule Take 1 capsule (100 mg total) by mouth 3 (three) times daily as needed for cough. 07/15/18  Yes Curt Bears, MD  BEVESPI AEROSPHERE 9-4.8 MCG/ACT AERO 2 PUFFS 2 TIMES A DAY Patient taking differently: Inhale 2 puffs into the lungs 2 (two) times daily.  07/15/18  Yes Young, Tarri Fuller D, MD  doxycycline (VIBRA-TABS) 100 MG tablet Take 1 tablet (100 mg total) by mouth 2 (two) times daily for 7 days. 07/16/18 07/23/18 Yes Gottschalk, Wink, DO  ezetimibe (ZETIA) 10 MG tablet TAKE 1  TABLET ONCE A DAY 05/24/18  Yes [name], MD  feeding supplement, ENSURE ENLIVE, (ENSURE ENLIVE) LIQD Take 237 mLs by mouth 2 (two) times daily between meals. 07/11/18  Yes Mariel Aloe, MD  fluticasone (FLONASE) 50 MCG/ACT nasal spray USE 2 SPRAYS IN EACH NOSTRIL ONCE DAILY. 01/02/18  Yes [name], MD  folic acid (FOLVITE) 1 MG tablet Take 1 mg by mouth daily.   Yes [provider]  HYDROcodone-acetaminophen (NORCO/VICODIN) 5-325 MG tablet Take 1 tablet by mouth 3 (three) times daily.   Yes [provider]  omeprazole (PRILOSEC) 40 MG capsule TAKE (1) CAPSULE DAILY Patient taking differently: Take 40 mg by mouth daily.  05/24/18  Yes [name], MD  PROAIR HFA 108 [phone] Base) MCG/ACT inhaler 2 PUFFS EVERY 4 HOURS AS NEEDED FOR WHEEZING Patient taking differently: Inhale 2 puffs into  the lungs every 4 (four) hours as needed for wheezing or shortness of breath.  02/21/16  Yes [name], MD  rosuvastatin (CRESTOR) 40 MG tablet TAKE 1/2 TABLET ONCE DAILY Patient taking differently: Take 20 mg by mouth daily.  05/22/18  Yes [name], MD  Nebulizers (COMPRESSOR NEBULIZER) MISC 1 Device by Does not apply route as needed. 01/01/15   Deneise Lever, MD  prochlorperazine (COMPAZINE) 10 MG tablet Take 1 tablet (10 mg total) by mouth every 6 (six) hours as needed for nausea or vomiting. Patient not taking: Reported on 07/17/2018 07/15/18   Curt Bears, MD    Physical Exam: Vitals:   07/17/18 1506 07/17/18 1739  BP: 109/65 108/61  Pulse: (!) 124 (!) 104  Resp: 20 18  Temp: 98.3 F (36.8 C)   TempSrc: Oral   SpO2: (!) 85% 94%    Constitutional: NAD, calm, comfortable.  Looks older than stated age.  Thinly built Vitals:   07/17/18 1506 07/17/18 1739  BP: 109/65 108/61  Pulse: (!) 124 (!) 104  Resp: 20 18  Temp: 98.3 F (36.8 C)   TempSrc: Oral   SpO2: (!) 85% 94%   Eyes: PERRL, lids and conjunctivae normal ENMT: Mucous membranes are moist.  Posterior pharynx clear of any exudate or lesions. Neck: normal, supple, no masses, no thyromegaly Respiratory: bilateral decreased breath sounds at bases with scattered crackles, no wheezing. Normal respiratory effort. No accessory muscle use.  Cardiovascular: S1 S2 positive, rate controlled. No extremity edema. 2+ pedal pulses.  Abdomen: no tenderness, no masses palpated. No hepatosplenomegaly. Bowel sounds positive.  Musculoskeletal: no clubbing / cyanosis. No joint deformity upper and lower extremities.  Skin: no rashes, lesions, ulcers. No induration Neurologic: CN 2-12 grossly intact. Moving extremities. No focal neurologic deficits.  Psychiatric: Normal judgment and insight. Alert and oriented x 3. Normal mood.    Labs on Admission: I have personally reviewed following labs and imaging studies  CBC: Recent Labs  Lab 07/15/18 1307 07/16/18 0853 07/17/18 1620  WBC 16.9* 19.7* 22.0*  NEUTROABS 13.2* 15.5* 17.4*  HGB 12.8 13.0 13.0  HCT 37.5 39.9 36.9  MCV 84.1 85 82.9  PLT 444* 473* 619*   Basic Metabolic Panel: Recent Labs  Lab 07/15/18 1307 07/16/18 0853 07/17/18 1620  NA 128* 124* 129*  K 3.6 4.0 3.6  CL 88* 83* 88*  CO2 29 25 26   GLUCOSE 103* 96 101*  BUN 10 8 10   CREATININE 0.67 0.50* 0.60  CALCIUM 9.6 8.8 9.1   GFR: Estimated Creatinine Clearance: 75.9 mL/min (by C-G formula based on SCr of 0.6 mg/dL). Liver Function Tests: Recent Labs  Lab 07/15/18 1307 07/16/18 0853 07/17/18 1620  AST 118* 126* 126*  ALT 41 41* 47*  ALKPHOS 364* 394* 361*  BILITOT 0.8 0.8 1.2  PROT 7.5 6.5 7.0  ALBUMIN 2.4* 3.1* 2.6*   No results for input(s): LIPASE, AMYLASE in the last 168 hours. No results for input(s): AMMONIA in the last 168 hours. Coagulation Profile: No results for input(s): INR, PROTIME in the last 168 hours. Cardiac Enzymes: No results for input(s): CKTOTAL, CKMB, CKMBINDEX, TROPONINI in the last 168 hours. BNP (last 3 results) No results for  input(s): PROBNP in the last 8760 hours. HbA1C: No results for input(s): HGBA1C in the last 72 hours. CBG: No results for input(s): GLUCAP in the last 168 hours. Lipid Profile: No results for input(s): CHOL, HDL, LDLCALC, TRIG, CHOLHDL, LDLDIRECT in the last 72 hours. Thyroid Function Tests:  No results for input(s): TSH, T4TOTAL, FREET4, T3FREE, THYROIDAB in the last 72 hours. Anemia Panel: No results for input(s): VITAMINB12, FOLATE, FERRITIN, TIBC, IRON, RETICCTPCT in the last 72 hours. Urine analysis:    Component Value Date/Time   COLORURINE YELLOW 07/06/2018 1618   APPEARANCEUR CLEAR 07/06/2018 1618   APPEARANCEUR Hazy (A) 07/19/2016 1512   LABSPEC 1.006 07/06/2018 1618   PHURINE 8.0 07/06/2018 1618   GLUCOSEU NEGATIVE 07/06/2018 1618   HGBUR NEGATIVE 07/06/2018 1618   BILIRUBINUR NEGATIVE 07/06/2018 1618   BILIRUBINUR Negative 07/19/2016 1512   KETONESUR 20 (A) 07/06/2018 1618   PROTEINUR NEGATIVE 07/06/2018 1618   UROBILINOGEN negative 07/14/2013 1553   NITRITE NEGATIVE 07/06/2018 1618   LEUKOCYTESUR NEGATIVE 07/06/2018 1618   LEUKOCYTESUR 2+ (A) 07/19/2016 1512    Radiological Exams on Admission: Dg Chest 2 View  Result Date: 07/17/2018 CLINICAL DATA:  Fever, dyspnea, previous smoker and COPD. EXAM: CHEST - 2 VIEW COMPARISON:  07/16/2018 FINDINGS: Increased pulmonary consolidation and opacities at the left lung base since previous exam suggesting some interval progression of suspected pneumonia and/or atelectasis. Mild vascular congestion is also noted slightly increased in appearance since prior. Heart size is within normal limits. Mild aortic atherosclerosis without aneurysm. No aggressive osseous lesions. IMPRESSION: 1. There has been some interval progression of airspace disease at the left lung base with a more consolidated appearance suggesting some worsening of pneumonia. 2. Pulmonary vascular congestion is also noted slightly more accentuated on prior exam. 3.   Aortic atherosclerosis (ICD10-I70.0) Electronically Signed   By: Ashley Royalty M.D.   On: 07/17/2018 17:02   Dg Chest 2 View  Result Date: 07/16/2018 CLINICAL DATA:  Elevated white blood cell count. Recently diagnosed with left-sided small cell lung malignancy. History of COPD, former smoker. EXAM: CHEST - 2 VIEW COMPARISON:  Portable chest x-ray of July 10, 2018. Chest CT scan of July 06, 2018. FINDINGS: There is progressive increased interstitial and airspace opacity in the left lower lobe. A small amount of increased density in the left upper lobe is stable since the previous CT scan. The right lung is mildly hyperinflated and clear. A small left pleural effusion is suspected. The heart and pulmonary vascularity are normal. There is calcification in the wall of the aortic arch. The bony thorax exhibits no acute abnormality. IMPRESSION: Progressive interstitial and airspace opacities in the left lower lobe worrisome for pneumonia. This may reflect a post obstructive process given the significant left-sided mediastinal lymphadenopathy described on the CT scan of July 06, 2018. A small amount of increased density in the left upper lobe may reflect atelectasis or pneumonia. Underlying COPD-smoking related changes. Thoracic aortic atherosclerosis. Electronically Signed   By: David  Martinique M.D.   On: 07/16/2018 09:31     Assessment/Plan Principal Problem:   Postobstructive pneumonia Active Problems:   Hyperlipidemia   Essential hypertension   Hyponatremia   SCLC (small cell lung carcinoma), left (HCC)   Abnormal LFTs   Leukocytosis   HCAP (healthcare-associated pneumonia)   Postobstructive pneumonia/healthcare assistant pneumonia in a patient with history of recent diagnosis of small cell lung cancer -Patient has been started on broad-spectrum antibiotics in the ED.  Will continue vancomycin and use cefepime instead of Azactam.  Patient states that she has nausea and vomiting to penicillins but  has tolerated amoxicillin in the past. -Oxygen supplementation as needed -Follow cultures.  Urine Legionella and streptococcal antigen  Leukocytosis -Probably secondary to above  Recent diagnosis of small cell lung cancer -Patient was recently  evaluate by Dr. Julien Nordmann who is planning to start chemotherapy as an outpatient.  Patient is scheduled for outpatient MRI of the brain and PET scan -We will notify Dr. Julien Nordmann via epic.  Outpatient follow-up with Dr. Julien Nordmann  Abnormal LFTs -We will repeat LFTs in a.m.  Might be secondary to metastatic disease versus changes of cirrhosis as seen on recent CT of the chest.  Will get hepatitis profile.  Check right upper quadrant ultrasound  Hyponatremia, probably chronic -Probably secondary to lung mass.  Monitor  COPD in a patient with alpha-1 antitrypsin deficiency -No signs of exacerbation.  Continue home regimen  Thrombocytosis -Probably reactive from lung cancer.  Monitor  Moderate malnutrition -Consult nutrition   DVT prophylaxis: Lovenox Code Status: Full Family Communication: Spoke to husband and daughter at bedside Disposition Plan: Home in 2 to 3 days pending clinical improvement Consults called: None Admission status: Inpatient/MedSurg  Severity of Illness: The appropriate patient status for this patient is INPATIENT. Inpatient status is judged to be reasonable and necessary in order to provide the required intensity of service to ensure the patient's safety. The patient's presenting symptoms, physical exam findings, and initial radiographic and laboratory data in the context of their chronic comorbidities is felt to place them at high risk for further clinical deterioration. Furthermore, it is not anticipated that the patient will be medically stable for discharge from the hospital within 2 midnights of admission. The following factors support the patient status of inpatient.   " The patient's presenting symptoms include worsening  shortness of breath. " The worrisome physical exam findings include crackles. " The initial radiographic and laboratory data are worrisome because of worsening pneumonia/leukocytosis/hyponatremia. " The chronic co-morbidities include recent diagnosis of small cell lung cancer.   * I certify that at the point of admission it is my clinical judgment that the patient will require inpatient hospital care spanning beyond 2 midnights from the point of admission due to high intensity of service, high risk for further deterioration and high frequency of surveillance required.Aline August MD [facility] Pager [phone]  If 7PM-7AM, please contact night-coverage [email or web address] Password [facility]  07/17/2018, 6:24 PM

## 2018-07-17 NOTE — Progress Notes (Signed)
A consult was received from an ED physician for vancomycin per pharmacy dosing.  The patient's profile has been reviewed for ht/wt/allergies/indication/available labs.   A one time order has been placed for vancomycin 1500 mg. MD also ordered aztreonam 2 gm IV x 1 dose  Further antibiotics/pharmacy consults should be ordered by admitting physician if indicated.                       Thank you, Eudelia Bunch, Pharm.D [phone] 07/17/2018 4:40 PM

## 2018-07-17 NOTE — ED Notes (Signed)
RN stayed with pt for the first 10 minutes of antibiotic therapy as pt had reported allergic reactions to multiple different antibiotics.

## 2018-07-17 NOTE — ED Provider Notes (Signed)
[facility] DEPT Provider Note   CSN: [id number] Arrival date & time: 07/17/18  1442     History   Chief Complaint Chief Complaint  Patient presents with  . Pneumonia    HPI [name] is a 62 y.o. female presenting for evaluation of cough, shortness of breath, and chills.  Patient states the past 2 weeks, she has been having increasing chills.  She has had worsening shortness of breath and left low lateral chest discomfort.  She reports persistent mildly productive cough, which has been baseline for her for the past several weeks.  She was recently admitted to the hospital 10 days ago for electrolyte abnormalities.  She had a standard follow-up with her primary care doctor yesterday, in which the chest x-ray showed a worsening left lower lobe pneumonia, and she was told to come to the emergency room.  She reports a history of lung cancer for which she is supposed to start chemo treatment in a few weeks.  Patient denies nausea, vomiting, abdominal pain, urinary symptoms, normal bowel movements.  She is not on oxygen at home.  She has a history of COPD and alpha-1 antitrypsin deficiency.  She is no longer smoking cigarettes, denies alcohol or drug use.  HPI  Past Medical History:  Diagnosis Date  . Alpha-1-antitrypsin deficiency ([place])   . Cervical cancer ([place]) 2004  . COPD mixed type ([place]) 09/14/2007   PFT- 05/06/2013-reduced FVC may reflect effort but the overall pattern is moderate obstructive airways disease with response to bronchodilator, air trapping, normal diffusion. This doesn't fit entirely with emphysema.      . Exposure to hepatitis C 09/12/2016  . Fibromyalgia   . GERD (gastroesophageal reflux disease)   . Hyperlipidemia   . IBS (irritable bowel syndrome)   . Obstructive sleep apnea 06/02/2008   NPSG 06/07/08, AHI 6.9/ hr, weight 220 lbs    . Osteoporosis   . Pulmonary fibrosis, unspecified ([place]) 09/29/2016   CXR 03/28/2016  .  Rhinitis, nonallergic 05/08/2012  . 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 Vitamin D deficiency     Patient Active Problem List   Diagnosis Date Noted  . SCLC (small cell lung carcinoma), left ([place]) 07/15/2018  . Encounter for antineoplastic chemotherapy 07/15/2018  . Encounter for antineoplastic immunotherapy 07/15/2018  . Goals of care, counseling/discussion 07/15/2018  . Malnutrition of moderate degree 07/08/2018  . Mass of lower lobe of left lung 07/07/2018  . Hyponatremia 07/06/2018  . Hypochloremia 07/06/2018  . Hypokalemia 07/06/2018  . Aortic atherosclerosis ([place]) 05/31/2017  . Essential hypertension 05/31/2017  . Dyslipidemia 05/31/2017  . Pulmonary fibrosis, unspecified ([place]) 09/29/2016  . Exposure to hepatitis C 09/12/2016  . Hyperlipidemia 10/06/2013  . Osteoporosis 06/25/2013  . Rhinitis, nonallergic 05/08/2012  . 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 DYSPNEA 06/24/2008  . Obstructive sleep apnea 06/02/2008  . Alpha-1-antitrypsin deficiency ([place]) 12/06/2007  . COPD mixed type ([place]) 09/14/2007  . Fibromyalgia 09/14/2007    Past Surgical History:  Procedure Laterality Date  . ABDOMINAL HYSTERECTOMY    . LUMBAR DISC SURGERY    . VIDEO BRONCHOSCOPY Bilateral 07/10/2018   Procedure: VIDEO BRONCHOSCOPY WITHOUT FLUORO;  Surgeon: Laurin Coder, MD;  Location: [facility] ENDOSCOPY;  Service: Cardiopulmonary;  Laterality: Bilateral;     OB History   None      Home Medications    Prior to Admission medications   Medication Sig Start Date End Date Taking?  Authorizing Provider  albuterol (PROVENTIL) (2.5 MG/3ML) 0.083% nebulizer solution Take 3 mLs (2.5 mg total) by nebulization every 6 (six) hours as needed. 06/07/17   [name], MD  amitriptyline (ELAVIL) 75 MG tablet Take 75 mg by mouth at bedtime.      [provider]  benzonatate (TESSALON) 100 MG capsule Take 1 capsule (100 mg total) by mouth 3 (three) times daily  as needed for cough. 07/15/18   Curt Bears, MD  BEVESPI AEROSPHERE 9-4.8 MCG/ACT AERO 2 PUFFS 2 TIMES A DAY 07/15/18   Baird Lyons D, MD  doxycycline (VIBRA-TABS) 100 MG tablet Take 1 tablet (100 mg total) by mouth 2 (two) times daily for 7 days. 07/16/18 07/23/18  [name], DO  ezetimibe (ZETIA) 10 MG tablet TAKE 1 TABLET ONCE A DAY 05/24/18   [name], MD  feeding supplement, ENSURE ENLIVE, (ENSURE ENLIVE) LIQD Take 237 mLs by mouth 2 (two) times daily between meals. 07/11/18   Mariel Aloe, MD  fluticasone (FLONASE) 50 MCG/ACT nasal spray USE 2 SPRAYS IN EACH NOSTRIL ONCE DAILY. 01/02/18   [name], MD  folic acid (FOLVITE) 1 MG tablet Take 1 mg by mouth daily.    [provider]  guaiFENesin (MUCINEX) 600 MG 12 hr tablet Take 1,200 mg by mouth 2 (two) times daily.      [provider]  HYDROcodone-acetaminophen (NORCO/VICODIN) 5-325 MG tablet Take 1 tablet by mouth 3 (three) times daily.    [provider]  Nebulizers (COMPRESSOR NEBULIZER) MISC 1 Device by Does not apply route as needed. 01/01/15   Deneise Lever, MD  omeprazole (PRILOSEC) 40 MG capsule TAKE (1) CAPSULE DAILY 05/24/18   [name], MD  PROAIR HFA 108 [phone] Base) MCG/ACT inhaler 2 PUFFS EVERY 4 HOURS AS NEEDED FOR WHEEZING 02/21/16   [name], MD  prochlorperazine (COMPAZINE) 10 MG tablet Take 1 tablet (10 mg total) by mouth every 6 (six) hours as needed for nausea or vomiting. 07/15/18   Curt Bears, MD  rosuvastatin (CRESTOR) 40 MG tablet TAKE 1/2 TABLET ONCE DAILY 05/22/18   [name], MD    Family History Family History  Problem Relation Age of Onset  . Alpha-1 antitrypsin deficiency Sister   . Osteoporosis Sister   . Pancreatic cancer Mother 32  . Osteoporosis Mother   . Irregular heart beat Mother   . Heart attack Father 30       Died age 41  . Migraines Father   . CAD Sister 69       Died age 55  . Osteoporosis Sister   . Lung cancer  Brother   . CAD Brother 90       CABG.  Died from leukemia  . Leukemia Brother   . Heart attack Brother   . Alpha-1 antitrypsin deficiency Other   . Heart disease Brother   . Colon cancer Neg Hx     Social History Social History   Tobacco Use  . Smoking status: Former Smoker    Packs/day: 0.50    Types: Cigarettes    Start date: 11/20/1972    Last attempt to quit: 04/01/2012    Years since quitting: 6.2  . Smokeless tobacco: Never Used  Substance Use Topics  . Alcohol use: No  . Drug use: No     Allergies   Penicillins; Actonel [risedronate sodium]; Advair diskus [fluticasone-salmeterol]; Alendronate sodium; Aspirin; Azithromycin; Levofloxacin; Morphine; Nsaids; Omnicef [cefdinir]; Erythromycin; and Iodine  Review of Systems Review of Systems  Constitutional: Positive for chills.  Respiratory: Positive for cough and shortness of breath.   Cardiovascular: Positive for chest pain.  All other systems reviewed and are negative.    Physical Exam Updated Vital Signs BP 109/65 (BP Location: Left Arm)   Pulse (!) 124   Temp 98.3 F (36.8 C) (Oral)   Resp 20   SpO2 (!) 85%   Physical Exam  Constitutional: She is oriented to person, place, and time.  Chronically ill-appearing female  HENT:  Head: Normocephalic and atraumatic.  MM dry.  OP clear without tonsillar swelling or exudate.  Uvula midline with equal palate rise.  Eyes: Pupils are equal, round, and reactive to light. Conjunctivae and EOM are normal.  Neck: Normal range of motion. Neck supple.  Cardiovascular: Regular rhythm and intact distal pulses.  Tachycardic around 120  Pulmonary/Chest: Effort normal and breath sounds normal. No respiratory distress. She has no wheezes.  On nasal cannula.  Speaking in short sentences.  Lung sounds clear.  No respiratory failure/distress  Abdominal: Soft. She exhibits no distension and no mass. There is no tenderness. There is no guarding.  Musculoskeletal: Normal range of  motion.  No leg pain or swelling.  Pedal pulses intact bilaterally  Neurological: She is alert and oriented to person, place, and time.  Skin: Skin is warm and dry. Capillary refill takes less than 2 seconds.  Psychiatric: She has a normal mood and affect.  Nursing note and vitals reviewed.    ED Treatments / Results  Labs (all labs ordered are listed, but only abnormal results are displayed) Labs Reviewed  CBC WITH DIFFERENTIAL/PLATELET  COMPREHENSIVE METABOLIC PANEL  I-STAT CG4 LACTIC ACID, ED    EKG None  Radiology Dg Chest 2 View  Result Date: 07/16/2018 CLINICAL DATA:  Elevated white blood cell count. Recently diagnosed with left-sided small cell lung malignancy. History of COPD, former smoker. EXAM: CHEST - 2 VIEW COMPARISON:  Portable chest x-ray of July 10, 2018. Chest CT scan of July 06, 2018. FINDINGS: There is progressive increased interstitial and airspace opacity in the left lower lobe. A small amount of increased density in the left upper lobe is stable since the previous CT scan. The right lung is mildly hyperinflated and clear. A small left pleural effusion is suspected. The heart and pulmonary vascularity are normal. There is calcification in the wall of the aortic arch. The bony thorax exhibits no acute abnormality. IMPRESSION: Progressive interstitial and airspace opacities in the left lower lobe worrisome for pneumonia. This may reflect a post obstructive process given the significant left-sided mediastinal lymphadenopathy described on the CT scan of July 06, 2018. A small amount of increased density in the left upper lobe may reflect atelectasis or pneumonia. Underlying COPD-smoking related changes. Thoracic aortic atherosclerosis. Electronically Signed   By: David  Martinique M.D.   On: 07/16/2018 09:31    Procedures .Critical Care Performed by: Franchot Heidelberg, PA-C Authorized by: Franchot Heidelberg, PA-C   Critical care provider statement:    Critical care  time (minutes):  40   Critical care time was exclusive of:  Separately billable procedures and treating other patients and teaching time   Critical care was necessary to treat or prevent imminent or life-threatening deterioration of the following conditions:  Sepsis   Critical care was time spent personally by me on the following activities:  Blood draw for specimens, development of treatment plan with patient or surrogate, discussions with consultants, evaluation of patient's response  to treatment, examination of patient, obtaining history from patient or surrogate, ordering and performing treatments and interventions, ordering and review of laboratory studies, ordering and review of radiographic studies, pulse oximetry, re-evaluation of patient's condition and review of old charts   I assumed direction of critical care for this patient from another provider in my specialty: no   Comments:     Pt presenting hypoxic and tachycardic. PNA on xray. abx and fluids given.    (including critical care time)  Medications Ordered in ED Medications  sodium chloride 0.9 % bolus 1,000 mL (has no administration in time range)  aztreonam (AZACTAM) 2 g in sodium chloride 0.9 % 100 mL IVPB (has no administration in time range)  vancomycin (VANCOCIN) 1,500 mg in sodium chloride 0.9 % 500 mL IVPB (has no administration in time range)     Initial Impression / Assessment and Plan / ED Course  I have reviewed the triage vital signs and the nursing notes.  Pertinent labs & imaging results that were available during my care of the patient were reviewed by me and considered in my medical decision making (see chart for details).     Patient presenting for evaluation of possible pneumonia with worsening signs on a chest x-ray, increased shortness of breath, chills, and cough.  Initial exam concerning, patient hypoxic at 85% on room air, is not on oxygen at home.  Tachycardic at 120.  Patient is not in respiratory  distress.  Will obtain labs, start fluids and antibiotics, and reassess.  Patient placed on 2 L nasal cannula.  Sats improved on oxygen, stable at 94%.  Heart rate improved 105.  White count elevated at 22, lactic normal.  Chest x-ray shows worsening signs of pneumonia.  As patient was recently admitted, and now has a pneumonia, will treat for HCAP.  Patient with new oxygen demand and with a history of cancer, concerned that she is more complicated and will require admission.  Discussed with attending, Dr. Sherry Ruffing agrees to plan.  Liver enzymes elevated, this change from previous.  Will not pursue further liver work-up at this time.  Will consult for admission.  Discussed with hospitalist, patient to be admitted to [facility].  Final Clinical Impressions(s) / ED Diagnoses   Final diagnoses:  HCAP (healthcare-associated pneumonia)  Malignant neoplasm of left lung, unspecified part of lung [facility])    ED Discharge Orders    None       Franchot Heidelberg, PA-C 07/17/18 1912    Tegeler, Gwenyth Allegra, MD 07/18/18 [phone]

## 2018-07-17 NOTE — ED Triage Notes (Signed)
Pt had chest xray done yesterday due to her lung cancer progressing and was called today stating that xray showed PNA and to get to ED. Pt is to start chemo in couple weeks.

## 2018-07-17 NOTE — ED Notes (Signed)
Patient transported to X-ray 

## 2018-07-17 NOTE — ED Notes (Signed)
ED TO INPATIENT HANDOFF REPORT  Name/Age/Gender [name] 62 y.o. female  Code Status    Code Status Orders  (From admission, onward)         Start     Ordered   07/17/18 1820  Full code  Continuous     07/17/18 1823        Code Status History    Date Active Date Inactive Code Status Order ID Comments User Context   07/06/2018 2046 07/10/2018 1856 Full Code [id number]  Truett Mainland, DO ED      Home/SNF/Other Home  Chief Complaint pneumonia  Level of Care/Admitting Diagnosis ED Disposition    ED Disposition Condition [facility] Area: [facility] [[id number]]  Level of Care: Med-Surg [16]  Diagnosis: HCAP (healthcare-associated pneumonia) [694503]  Admitting Physician: Aline August [[id number]]  Attending Physician: Aline August [[id number]]  Estimated length of stay: 3 - 4 days  Certification:: I certify this patient will need inpatient services for at least 2 midnights  PT Class (Do Not Modify): Inpatient [101]  PT Acc Code (Do Not Modify): Private [1]       Medical History Past Medical History:  Diagnosis Date  . Alpha-1-antitrypsin deficiency ([place])   . Cervical cancer ([place]) 2004  . COPD mixed type ([place]) 09/14/2007   PFT- 05/06/2013-reduced FVC may reflect effort but the overall pattern is moderate obstructive airways disease with response to bronchodilator, air trapping, normal diffusion. This doesn't fit entirely with emphysema.      . Exposure to hepatitis C 09/12/2016  . Fibromyalgia   . GERD (gastroesophageal reflux disease)   . Hyperlipidemia   . IBS (irritable bowel syndrome)   . Obstructive sleep apnea 06/02/2008   NPSG 06/07/08, AHI 6.9/ hr, weight 220 lbs    . Osteoporosis   . Pulmonary fibrosis, unspecified ([place]) 09/29/2016   CXR 03/28/2016  . Rhinitis, nonallergic 05/08/2012  . 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 Vitamin D deficiency     Allergies Allergies   Allergen Reactions  . Penicillins Nausea And Vomiting    Has patient had a PCN reaction causing immediate rash, facial/tongue/throat swelling, SOB or lightheadedness with hypotension: Unknown Has patient had a PCN reaction causing severe rash involving mucus membranes or skin necrosis: Unknown Has patient had a PCN reaction that required hospitalization: Unknown Has patient had a PCN reaction occurring within the last 10 years: Unknown If all of the above answers are "NO", then may proceed with Cephalosporin use.   Marland Kitchen Actonel [Risedronate Sodium]     Caused her to retain fluid and stomach problems  . Advair Diskus [Fluticasone-Salmeterol]   . Alendronate Sodium     Stomach upset / esophageal burning  . Aspirin Nausea Only    Stomach burns; abdominal pain  . Azithromycin Nausea And Vomiting  . Levofloxacin     Reports hx of GI distress and GI bleed  . Morphine Itching and Swelling    Throat swells  . Nsaids     Swelling, GI upset   . Omnicef [Cefdinir]   . Erythromycin Rash  . Iodine Rash    Topical and IV    IV Location/Drains/Wounds Patient Lines/Drains/Airways Status   Active Line/Drains/Airways    Name:   Placement date:   Placement time:   Site:   Days:   Peripheral IV 07/17/18 Left Forearm   07/17/18    1625    Forearm  less than 1          Labs/Imaging Results for orders placed or performed during the hospital encounter of 07/17/18 (from the past 48 hour(s))  CBC with Differential     Status: Abnormal   Collection Time: 07/17/18  4:20 PM  Result Value Ref Range   WBC 22.0 (H) 4.0 - 10.5 K/uL   RBC 4.45 3.87 - 5.11 MIL/uL   Hemoglobin 13.0 12.0 - 15.0 g/dL   HCT 36.9 36.0 - 46.0 %   MCV 82.9 78.0 - 100.0 fL   MCH 29.2 26.0 - 34.0 pg   MCHC 35.2 30.0 - 36.0 g/dL   RDW 17.2 (H) 11.5 - 15.5 %   Platelets 534 (H) 150 - 400 K/uL   Neutrophils Relative % 79 %   Neutro Abs 17.4 (H) 1.7 - 7.7 K/uL   Lymphocytes Relative 10 %   Lymphs Abs 2.3 0.7 - 4.0 K/uL    Monocytes Relative 11 %   Monocytes Absolute 2.3 (H) 0.1 - 1.0 K/uL   Eosinophils Relative 0 %   Eosinophils Absolute 0.0 0.0 - 0.7 K/uL   Basophils Relative 0 %   Basophils Absolute 0.0 0.0 - 0.1 K/uL    Comment: Performed at [facility], [place] [address] [place], [place] [address]  Comprehensive metabolic panel     Status: Abnormal   Collection Time: 07/17/18  4:20 PM  Result Value Ref Range   Sodium 129 (L) 135 - 145 mmol/L   Potassium 3.6 3.5 - 5.1 mmol/L   Chloride 88 (L) 98 - 111 mmol/L   CO2 26 22 - 32 mmol/L   Glucose, Bld 101 (H) 70 - 99 mg/dL   BUN 10 6 - 20 mg/dL   Creatinine, Ser 0.60 0.44 - 1.00 mg/dL   Calcium 9.1 8.9 - 10.3 mg/dL   Total Protein 7.0 6.5 - 8.1 g/dL   Albumin 2.6 (L) 3.5 - 5.0 g/dL   AST 126 (H) 15 - 41 U/L   ALT 47 (H) 0 - 44 U/L   Alkaline Phosphatase 361 (H) 38 - 126 U/L   Total Bilirubin 1.2 0.3 - 1.2 mg/dL   GFR calc non Af Amer >60 >60 mL/min   GFR calc Af Amer >60 >60 mL/min    Comment: (NOTE) The eGFR has been calculated using the CKD EPI equation. This calculation has not been validated in all clinical situations. eGFR's persistently <60 mL/min signify possible Chronic Kidney Disease.    Anion gap 15 5 - 15    Comment: Performed at [facility], [place] [address] [place], [place] [address]  I-Stat CG4 Lactic Acid, ED     Status: None   Collection Time: 07/17/18  4:29 PM  Result Value Ref Range   Lactic Acid, Venous 1.64 0.5 - 1.9 mmol/L   Dg Chest 2 View  Result Date: 07/17/2018 CLINICAL DATA:  Fever, dyspnea, previous smoker and COPD. EXAM: CHEST - 2 VIEW COMPARISON:  07/16/2018 FINDINGS: Increased pulmonary consolidation and opacities at the left lung base since previous exam suggesting some interval progression of suspected pneumonia and/or atelectasis. Mild vascular congestion is also noted slightly increased in appearance since prior. Heart size is within normal limits. Mild aortic atherosclerosis  without aneurysm. No aggressive osseous lesions. IMPRESSION: 1. There has been some interval progression of airspace disease at the left lung base with a more consolidated appearance suggesting some worsening of pneumonia. 2. Pulmonary vascular congestion is also noted slightly more accentuated on  prior exam. 3.  Aortic atherosclerosis (ICD10-I70.0) Electronically Signed   By: Ashley Royalty M.D.   On: 07/17/2018 17:02   Dg Chest 2 View  Result Date: 07/16/2018 CLINICAL DATA:  Elevated white blood cell count. Recently diagnosed with left-sided small cell lung malignancy. History of COPD, former smoker. EXAM: CHEST - 2 VIEW COMPARISON:  Portable chest x-ray of July 10, 2018. Chest CT scan of July 06, 2018. FINDINGS: There is progressive increased interstitial and airspace opacity in the left lower lobe. A small amount of increased density in the left upper lobe is stable since the previous CT scan. The right lung is mildly hyperinflated and clear. A small left pleural effusion is suspected. The heart and pulmonary vascularity are normal. There is calcification in the wall of the aortic arch. The bony thorax exhibits no acute abnormality. IMPRESSION: Progressive interstitial and airspace opacities in the left lower lobe worrisome for pneumonia. This may reflect a post obstructive process given the significant left-sided mediastinal lymphadenopathy described on the CT scan of July 06, 2018. A small amount of increased density in the left upper lobe may reflect atelectasis or pneumonia. Underlying COPD-smoking related changes. Thoracic aortic atherosclerosis. Electronically Signed   By: David  Martinique M.D.   On: 07/16/2018 09:31    Pending Labs Unresulted Labs (From admission, onward)    Start     Ordered   07/24/18 0500  Creatinine, serum  (enoxaparin (LOVENOX)    CrCl >/= 30 ml/min)  Weekly,   R    Comments:  while on enoxaparin therapy    07/17/18 1823   07/18/18 0500  HIV antibody (Routine Testing)   Tomorrow morning,   R     07/17/18 1823   07/18/18 0500  CBC  Tomorrow morning,   R     07/17/18 1823   07/18/18 0500  Comprehensive metabolic panel  Tomorrow morning,   R     07/17/18 1823   07/17/18 1823  MRSA PCR Screening  Once,   R     07/17/18 1823   07/17/18 1822  Legionella Pneumophila Serogp 1 Ur Ag  Once,   R     07/17/18 1823   07/17/18 1822  Influenza panel by PCR (type A & B)  (Influenza PCR Panel)  Once,   R     07/17/18 1823   07/17/18 1820  HIV antibody (Routine Screening)  Once,   R     07/17/18 1823   07/17/18 1820  Culture, blood (routine x 2) Call MD if unable to obtain prior to antibiotics being given  BLOOD CULTURE X 2,   R    Comments:  If blood cultures drawn in Emergency Department - Do not draw and cancel order    07/17/18 1823   07/17/18 1820  Culture, sputum-assessment  Once,   R     07/17/18 1823   07/17/18 1820  Gram stain  Once,   R     07/17/18 1823   07/17/18 1820  Strep pneumoniae urinary antigen  Once,   R     07/17/18 1823   07/17/18 1820  CBC  (enoxaparin (LOVENOX)    CrCl >/= 30 ml/min)  Once,   R    Comments:  Baseline for enoxaparin therapy IF NOT ALREADY DRAWN.  Notify MD if PLT < 100 K.    07/17/18 1823   07/17/18 1820  Creatinine, serum  (enoxaparin (LOVENOX)    CrCl >/= 30 ml/min)  Once,   R  Comments:  Baseline for enoxaparin therapy IF NOT ALREADY DRAWN.    07/17/18 1823   07/17/18 1639  Culture, blood (single)  STAT,   STAT     07/17/18 1638          Vitals/Pain Today's Vitals   07/17/18 1506 07/17/18 1507 07/17/18 1739  BP: 109/65  108/61  Pulse: (!) 124  (!) 104  Resp: 20  18  Temp: 98.3 F (36.8 C)    TempSrc: Oral    SpO2: (!) 85%  94%  PainSc:  7      Isolation Precautions Droplet precaution  Medications Medications  vancomycin (VANCOCIN) 1,500 mg in sodium chloride 0.9 % 500 mL IVPB (1,500 mg Intravenous New Bag/Given 07/17/18 1720)  HYDROcodone-acetaminophen (NORCO/VICODIN) 5-325 MG per tablet 1 tablet  (has no administration in time range)  ezetimibe (ZETIA) tablet 10 mg (has no administration in time range)  rosuvastatin (CRESTOR) tablet 20 mg (has no administration in time range)  amitriptyline (ELAVIL) tablet 75 mg (has no administration in time range)  pantoprazole (PROTONIX) EC tablet 40 mg (has no administration in time range)  folic acid (FOLVITE) tablet 1 mg (has no administration in time range)  feeding supplement (ENSURE ENLIVE) (ENSURE ENLIVE) liquid 237 mL (has no administration in time range)  albuterol (PROVENTIL) (2.5 MG/3ML) 0.083% nebulizer solution 2.5 mg (has no administration in time range)  benzonatate (TESSALON) capsule 100 mg (has no administration in time range)  fluticasone (FLONASE) 50 MCG/ACT nasal spray 2 spray (has no administration in time range)  mometasone-formoterol (DULERA) 100-5 MCG/ACT inhaler 2 puff (has no administration in time range)  enoxaparin (LOVENOX) injection 40 mg (has no administration in time range)  acetaminophen (TYLENOL) tablet 650 mg (has no administration in time range)    Or  acetaminophen (TYLENOL) suppository 650 mg (has no administration in time range)  ondansetron (ZOFRAN) tablet 4 mg (has no administration in time range)    Or  ondansetron (ZOFRAN) injection 4 mg (has no administration in time range)  sodium chloride 0.9 % bolus 1,000 mL (0 mLs Intravenous Stopped 07/17/18 1719)  aztreonam (AZACTAM) 2 g in sodium chloride 0.9 % 100 mL IVPB (0 g Intravenous Stopped 07/17/18 1702)    Mobility walks with person assist

## 2018-07-17 NOTE — Progress Notes (Signed)
Pharmacy Antibiotic Note  BREN BORYS is a 62 y.o. female admitted on 07/17/2018 with HCAP.  Pharmacy has been consulted for vancomycin and cefepime dosing for HCAP.  PCN allergy- N/V Omnicef allergy- not specified No record of other cephalosporins in Epic- MD aware PCN = nausea, has tolerated amoxicillin in past.    Plan: Vancomycin 1500 mg IV loading dose followed by vancomycin 750 mg IV q12 for est AUC 455 (Scr 0.6, IBW/ABW) Cefepime 2 gm loading dose followed by cefepime 1 gm IV q8h F/u renal fxn, WBC, temp, culture data Vancomycin levels as needed   Temp (24hrs), Avg:98.3 F (36.8 C), Min:98.3 F (36.8 C), Max:98.3 F (36.8 C)  Recent Labs  Lab 07/15/18 1307 07/16/18 0853 07/17/18 1620 07/17/18 1629  WBC 16.9* 19.7* 22.0*  --   CREATININE 0.67 0.50* 0.60  --   LATICACIDVEN  --   --   --  1.64    Estimated Creatinine Clearance: 75.9 mL/min (by C-G formula based on SCr of 0.6 mg/dL).    Allergies  Allergen Reactions  . Penicillins Nausea And Vomiting    Has patient had a PCN reaction causing immediate rash, facial/tongue/throat swelling, SOB or lightheadedness with hypotension: Unknown Has patient had a PCN reaction causing severe rash involving mucus membranes or skin necrosis: Unknown Has patient had a PCN reaction that required hospitalization: Unknown Has patient had a PCN reaction occurring within the last 10 years: Unknown If all of the above answers are "NO", then may proceed with Cephalosporin use.   Marland Kitchen Actonel [Risedronate Sodium]     Caused her to retain fluid and stomach problems  . Advair Diskus [Fluticasone-Salmeterol]   . Alendronate Sodium     Stomach upset / esophageal burning  . Aspirin Nausea Only    Stomach burns; abdominal pain  . Azithromycin Nausea And Vomiting  . Levofloxacin     Reports hx of GI distress and GI bleed  . Morphine Itching and Swelling    Throat swells  . Nsaids     Swelling, GI upset   . Omnicef [Cefdinir]   .  Erythromycin Rash  . Iodine Rash    Topical and IV    Antimicrobials this admission: 8/28 vanc>> 8/28 cefepime>> 8/28 aztreonam x 1 dose 8/18 MRSA PCR neg  Dose adjustments this admission: Microbiology results: 8/28 BCx2>>ordered 8/28 BCx1>>sent 8/28 sputum>> ordered 8/28 influenza> 8/28 HIV>> 8/28 legionella>> 8/28 strep pneumo Uag>> 8/21 LL lung> few H flu Thank you for allowing pharmacy to be a part of this patient's care.  Eudelia Bunch, Pharm.D [phone] 07/17/2018 7:07 PM

## 2018-07-18 ENCOUNTER — Other Ambulatory Visit: Payer: Self-pay | Admitting: *Deleted

## 2018-07-18 LAB — COMPREHENSIVE METABOLIC PANEL
ALBUMIN: 2.1 g/dL — AB (ref 3.5–5.0)
ALT: 41 U/L (ref 0–44)
ANION GAP: 10 (ref 5–15)
AST: 112 U/L — AB (ref 15–41)
Alkaline Phosphatase: 313 U/L — ABNORMAL HIGH (ref 38–126)
BUN: 10 mg/dL (ref 6–20)
CHLORIDE: 93 mmol/L — AB (ref 98–111)
CO2: 29 mmol/L (ref 22–32)
Calcium: 8.3 mg/dL — ABNORMAL LOW (ref 8.9–10.3)
Creatinine, Ser: 0.48 mg/dL (ref 0.44–1.00)
GFR calc Af Amer: >60 mL/min (ref >60)
GFR calc non Af Amer: >60 mL/min (ref >60)
GLUCOSE: 116 mg/dL — AB (ref 70–99)
POTASSIUM: 3.1 mmol/L — AB (ref 3.5–5.1)
SODIUM: 132 mmol/L — AB (ref 135–145)
TOTAL PROTEIN: 5.8 g/dL — AB (ref 6.5–8.1)
Total Bilirubin: 1 mg/dL (ref 0.3–1.2)

## 2018-07-18 LAB — HIV ANTIBODY (ROUTINE TESTING W REFLEX)
HIV SCREEN 4TH GENERATION: NONREACTIVE
HIV Screen 4th Generation wRfx: NONREACTIVE

## 2018-07-18 LAB — CBC
HCT: 33.9 % — ABNORMAL LOW (ref 36.0–46.0)
Hemoglobin: 11.6 g/dL — ABNORMAL LOW (ref 12.0–15.0)
MCH: 28.7 pg (ref 26.0–34.0)
MCHC: 34.2 g/dL (ref 30.0–36.0)
MCV: 83.9 fL (ref 78.0–100.0)
PLATELETS: 503 10*3/uL — AB (ref 150–400)
RBC: 4.04 MIL/uL (ref 3.87–5.11)
RDW: 17.7 % — AB (ref 11.5–15.5)
WBC: 16.4 10*3/uL — AB (ref 4.0–10.5)

## 2018-07-18 LAB — PROCALCITONIN: Procalcitonin: 1.9 ng/mL

## 2018-07-18 MED ORDER — POTASSIUM CHLORIDE CRYS ER 20 MEQ PO TBCR
40.0000 meq | EXTENDED_RELEASE_TABLET | ORAL | Status: AC
  Administered 2018-07-18 (×2): 40 meq via ORAL
  Filled 2018-07-18 (×2): qty 2

## 2018-07-18 MED ORDER — SODIUM CHLORIDE 0.9 % IV SOLN
INTRAVENOUS | Status: DC | PRN
  Administered 2018-07-18: 350 mL via INTRAVENOUS
  Administered 2018-07-19: 500 mL via INTRAVENOUS

## 2018-07-18 NOTE — Progress Notes (Signed)
Initial Nutrition Assessment  DOCUMENTATION CODES:   Non-severe (moderate) malnutrition in context of acute illness/injury  INTERVENTION:  - Continue Ensure Enlive BID, each supplement provides 350 kcal and 20 grams of protein. - Continue to encourage PO intakes. - RD will monitor for additional nutrition-related needs.    NUTRITION DIAGNOSIS:   Moderate Malnutrition related to acute illness(recent dx of SCLC) as evidenced by percent weight loss, mild fat depletion, mild muscle depletion.  GOAL:   Patient will meet greater than or equal to 90% of their needs  MONITOR:   PO intake, Supplement acceptance, Weight trends, Labs  REASON FOR ASSESSMENT:   Malnutrition Screening Tool  ASSESSMENT:   62 y.o. female with recent diagnosis of small cell lung cancer diagnosed from bronchoscopy and pathology done on 8/21. Patient met with Dr. Julien Nordmann and plan was for chemo start 9/4. PMH also includes COPD, alpha-1 antitrypsin deficiency, pulmonary fibrosis, fibromyalgia, dyslipidemia, GERD, IBS, sleep apnea, vitamin D deficiency, cervical cancer 14 years ago status post resection. She was admitted 8/17-8/21 for electrolyte abnormalities and left lung mass status post bronchoscopy and biopsy. She presented to the ED 8/28 with worsening SOB, cough, and chills. Admitted with dx of post-obstructive PNA.  BMI indicates overweight status. Patient reports that she had a fruit plate for lunch but was not able to eat much of it d/t taste. She was seen by another RD on 8/19 and prior to that time, and ongoing, she has been having taste changes (weird sweet taste, bland taste) which she attributes to ongoing need for IV abx. Patient was previously able to drink vanilla Ensure but now chocolate is more palatable. She enjoys fresh fruits and vegetables and has fruit trees and a garden and cans items. She denies overt appetite loss, but states that it is difficult to find items that are palatable d/t taste  changes and that is why she has not been eating much.  Patient states some intermittent episodes of nausea PTA but no nausea today. No abdominal pain/pressure. No chewing or swallowing issues. Noted that patient was seen by SLP after RD visit and patient cleared for regular consistency foods and thin liquids.   NFPE outlined below. Patient reports losing weight over the past 1-1.5 months. Per chart review, she has lost 12 lbs (6.7% body weight) in the past 1 month.  Medications reviewed; 1 mg oral folic acid/day, 40 mEq oral KCL x2 doses today. Labs reviewed; Na: 132 mmol/L, K: 3.1 mmol/L, Cl: 93 mmol/L, Ca: 8.3 mg/dL, Alk Phos elevated, AST elevated.       NUTRITION - FOCUSED PHYSICAL EXAM:    Most Recent Value  Orbital Region  Mild depletion  Upper Arm Region  Mild depletion  Thoracic and Lumbar Region  Unable to assess  Buccal Region  No depletion  Temple Region  Mild depletion  Clavicle Bone Region  Mild depletion  Clavicle and Acromion Bone Region  Mild depletion  Scapular Bone Region  No depletion  Dorsal Hand  No depletion  Patellar Region  No depletion  Anterior Thigh Region  Mild depletion  Posterior Calf Region  Mild depletion  Edema (RD Assessment)  None  Hair  Reviewed  Eyes  Reviewed  Mouth  Reviewed  Skin  Reviewed  Nails  Reviewed       Diet Order:   Diet Order            Diet Heart Room service appropriate? Yes; Fluid consistency: Thin  Diet effective now  EDUCATION NEEDS:   No education needs have been identified at this time  Skin:  Skin Assessment: Reviewed RN Assessment  Last BM:  8/28  Height:   Ht Readings from Last 1 Encounters:  07/18/18 '5\' 5"'$  (1.651 m)    Weight:   Wt Readings from Last 1 Encounters:  07/18/18 76 kg    Ideal Body Weight:  56.82 kg  BMI:  Body mass index is 27.88 kg/m.  Estimated Nutritional Needs:   Kcal:  2280-2500 (30-33 kcal/kg)  Protein:  115-125 grams  Fluid:  >/= 2.2  L/day     Jarome Matin, MS, RD, LDN, [facility] Inpatient Clinical Dietitian Pager # [phone] After hours/weekend pager # [phone]

## 2018-07-18 NOTE — Progress Notes (Signed)
Patient and husband asked about MRI that was scheduled outpatient for 8/30. They asked if it could be in-patient. Per Dr. Penni Homans, this will need to be re-scheduled outpatient.

## 2018-07-18 NOTE — Evaluation (Signed)
Clinical/Bedside Swallow Evaluation Patient Details  Name: [name] MRN: [id number] Date of Birth: July 29, 1956  Today's Date: 07/18/2018 Time: SLP Start Time (ACUTE ONLY): 1440 SLP Stop Time (ACUTE ONLY): 1451 SLP Time Calculation (min) (ACUTE ONLY): 11 min  Past Medical History:  Past Medical History:  Diagnosis Date  . Alpha-1-antitrypsin deficiency ([place])   . Cervical cancer ([place]) 2004  . COPD mixed type ([place]) 09/14/2007   PFT- 05/06/2013-reduced FVC may reflect effort but the overall pattern is moderate obstructive airways disease with response to bronchodilator, air trapping, normal diffusion. This doesn't fit entirely with emphysema.      . Exposure to hepatitis C 09/12/2016  . Fibromyalgia   . GERD (gastroesophageal reflux disease)   . Hyperlipidemia   . IBS (irritable bowel syndrome)   . Obstructive sleep apnea 06/02/2008   NPSG 06/07/08, AHI 6.9/ hr, weight 220 lbs    . Osteoporosis   . Pulmonary fibrosis, unspecified ([place]) 09/29/2016   CXR 03/28/2016  . Rhinitis, nonallergic 05/08/2012  . 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 Vitamin D deficiency    Past Surgical History:  Past Surgical History:  Procedure Laterality Date  . ABDOMINAL HYSTERECTOMY    . LUMBAR DISC SURGERY    . VIDEO BRONCHOSCOPY Bilateral 07/10/2018   Procedure: VIDEO BRONCHOSCOPY WITHOUT FLUORO;  Surgeon: Laurin Coder, MD;  Location: [facility];  Service: Cardiopulmonary;  Laterality: Bilateral;   HPI:  62 yo female admitted with electrolyte imbalance.  Pt with recent diagnosis of post-obstructive pna, small cell lung cancer.   Swallow eval orderd.  Pt PMH also + for cervical cancer, HTN, prior tobacco use.  Per chart plan is for pt to have chemo in future.  Imaging study of chest 03/2016 showed pulmonary fibrosis.  Pt denies dysphagia despite her report of compression on esophagus from her mass.     Assessment / Plan / Recommendation Clinical Impression  Pt presents with functional oropharyngeal swallow based on clinical swallow evaluation.  No indication of airway compromise/aspiration with po intake observed.  She easily passed 3 ounce water test.   No indication of residuals/stasis with puree/crackers.  Reviewed possible indications of dysphagia for pt to monitor.  No SLP follow up needed.  Thanks for your referral.   SLP Visit Diagnosis: Dysphagia, unspecified (R13.10)    Aspiration Risk  No limitations    Diet Recommendation Regular;Thin liquid   Liquid Administration via: Cup;Straw Medication Administration: Whole meds with liquid Supervision: Patient able to self feed Compensations: Slow rate;Small sips/bites    Other  Recommendations Oral Care Recommendations: Oral care BID   Follow up Recommendations None      Frequency and Duration   n/a         Prognosis   n/a     Swallow Study   General Date of Onset: 07/18/18 HPI: 62 yo female admitted with electrolyte imbalance.  Pt with recent diagnosis of post-obstructive pna, small cell lung cancer.   Swallow eval orderd.  Pt PMH also + for cervical cancer, HTN, prior tobacco use.  Per chart plan is for pt to have chemo in future.  Imaging study of chest 03/2016 showed pulmonary fibrosis.  Pt denies dysphagia despite her report of compression on esophagus from her mass.   Type of Study: Bedside Swallow Evaluation Previous Swallow Assessment: none Diet Prior to this Study: Regular;Thin liquids Temperature Spikes Noted: No Respiratory Status: Nasal cannula History of Recent Intubation: No Behavior/Cognition: Alert;Cooperative;Pleasant  mood Oral Cavity Assessment: Within Functional Limits Oral Care Completed by SLP: No Oral Cavity - Dentition: Dentures, top;Adequate natural dentition Vision: Functional for self-feeding Self-Feeding Abilities: Able to feed self Patient Positioning: Upright in bed Baseline Vocal Quality: Normal Volitional Cough: Strong    Oral/Motor/Sensory  Function Overall Oral Motor/Sensory Function: Within functional limits   Ice Chips Ice chips: Not tested   Thin Liquid Thin Liquid: Within functional limits Presentation: Cup;Self Fed;Straw Other Comments: pt easily passed 3 ounce water test without difficulties    Nectar Thick Nectar Thick Liquid: Not tested   Honey Thick Honey Thick Liquid: Not tested   Puree Puree: Within functional limits Presentation: Self Fed;Spoon   Solid     Solid: Within functional limits Presentation: Self [name] 07/18/2018,3:00 PM   [name], [facility] SLP [phone]

## 2018-07-18 NOTE — Evaluation (Signed)
Physical Therapy Evaluation Patient Details Name: [name] MRN: [id number] DOB: 1956-01-16 Today's Date: 07/18/2018   History of Present Illness  Pt is a 62 YO female arrived to ED on 8/28 with L lower lobe PNA. Pt admitted to hospital 10 days ago for electrolye imbalance. Pt also with diagnosis of L lower lobe lung cancer, to receive chemotherapy in a few weeks. PMH includes previous tobacco use, pulmonary fibrosis, osteoporosis, IBS, HLD, sleep apnea, GERD, fibromyalgia, COPD, cervical cancer 2004, alpha-1 antigen deficiency, atherosclerosis, HTN. Surgical history includes lumbar disc surgery.   Clinical Impression   Pt with admission information and PMH above. Pt presents with increased time to perform mobility tasks, O2 desaturation with mobility, decreased LE strength, and decreased balance. Pt would benefit from acute PT to address deficits. Pt required 1 standing rest break during 400 ft ambulation today, with desaturation to 86% while on 3L via [place]. Pt bumped up to 4, then 6 L due to difficulty recovering sats. Pt stayed in high 80s-low 90s for duration of session. Pt with 2 coughing episodes during session, with no production of secretions. Will continue to follow acutely. Recommending HHPT to improve mobility and return to PLOF.     Follow Up Recommendations Home health PT    Equipment Recommendations  None recommended by PT    Recommendations for Other Services       Precautions / Restrictions Precautions Precautions: None Restrictions Weight Bearing Restrictions: No      Mobility  Bed Mobility Overal bed mobility: Modified Independent             General bed mobility comments: increased time. Supine to sit with O2sat to 88-90%. Bumped up to 3LO2.   Transfers Overall transfer level: Needs assistance Equipment used: None Transfers: Sit to/from Stand Sit to Stand: Modified independent (Device/Increase time)         General transfer comment: increased time  to perform   Ambulation/Gait Ambulation/Gait assistance: Supervision Gait Distance (Feet): 400 Feet(2x200 ft, standing rest break to recover O2s) Assistive device: None Gait Pattern/deviations: Step-through pattern;Decreased stride length Gait velocity: decr   General Gait Details: Pt on 3LO2 via [place] upon beginning ambulation. Ambulated 200 ft and took standing rest break to recover sats, as they reached 86%. Bumped up to 4LO2, with minimal O2 recovery. Bumped to 6L, and recovered O2sats to 94%. Back to 90% with ambulation.  Stairs            Wheelchair Mobility    Modified Rankin (Stroke Patients Only)       Balance                 Single Leg Stance - Right Leg: 6 Single Leg Stance - Left Leg: 3 Tandem Stance - Right Leg: 0 Tandem Stance - Left Leg: 0 [place] - Eyes Opened: 30     High Level Balance Comments: 4 stage balance test (standing, semitandem, tandem, SLS bilat): 30 seconds, 30 seconds, 0 seconds, R SLS 6 seconds, L SLS 3 seconds.              Pertinent Vitals/Pain Pain Assessment: 0-10 Pain Score: 7  Pain Location: L side, when she coughs  Pain Descriptors / Indicators: Aching;Grimacing Pain Intervention(s): Limited activity within patient's tolerance    Home Living Family/patient expects to be discharged to:: Private residence Living Arrangements: Spouse/significant other Available Help at Discharge: Family;Available 24 hours/day Type of Home: House Home Access: Stairs to enter Entrance Stairs-Rails: None Entrance Stairs-Number of Steps: 1  Home Layout: One level Home Equipment: None      Prior Function Level of Independence: Independent               Hand Dominance   Dominant Hand: Right    Extremity/Trunk Assessment   Upper Extremity Assessment Upper Extremity Assessment: Overall WFL for tasks assessed    Lower Extremity Assessment Lower Extremity Assessment: RLE deficits/detail;LLE deficits/detail RLE Deficits /  Details: knee extension, knee flexion, and hip flexion 4/5. Other motions grossly 5/5.  LLE Deficits / Details: knee extension, knee flexion, and hip flexion 4/5. Other motions grossly 5/5.     Cervical / Trunk Assessment Cervical / Trunk Assessment: Kyphotic  Communication   Communication: No difficulties  Cognition Arousal/Alertness: Awake/alert Behavior During Therapy: WFL for tasks assessed/performed Overall Cognitive Status: Within Functional Limits for tasks assessed                                        General Comments      Exercises Total Joint Exercises Long Arc Quad: AROM;Both;10 reps;Seated Marching in Standing: AROM;Both;10 reps;Seated   Assessment/Plan    PT Assessment Patient needs continued PT services  PT Problem List Decreased strength;Pain;Decreased activity tolerance;Decreased balance;Decreased mobility       PT Treatment Interventions Therapeutic activities;Gait training;Therapeutic exercise;Patient/family education;Stair training;Balance training;Functional mobility training    PT Goals (Current goals can be found in the Care Plan section)  Acute Rehab PT Goals PT Goal Formulation: With patient Time For Goal Achievement: 08/01/18 Potential to Achieve Goals: Good    Frequency Min 3X/week   Barriers to discharge        Co-evaluation               AM-PAC PT "6 Clicks" Daily Activity  Outcome Measure Difficulty turning over in bed (including adjusting bedclothes, sheets and blankets)?: None Difficulty moving from lying on back to sitting on the side of the bed? : None Difficulty sitting down on and standing up from a chair with arms (e.g., wheelchair, bedside commode, etc,.)?: A Little Help needed moving to and from a bed to chair (including a wheelchair)?: A Little Help needed walking in hospital room?: A Little Help needed climbing 3-5 steps with a railing? : A Little 6 Click Score: 20    End of Session Equipment  Utilized During Treatment: Gait belt;Oxygen Activity Tolerance: Treatment limited secondary to medical complications (Comment);No increased pain;Patient tolerated treatment well(Pt limited by dyspnea ) Patient left: in chair;with call bell/phone within reach;with family/visitor present Nurse Communication: Mobility status;Other (comment)(O2 needs during session ) PT Visit Diagnosis: Other abnormalities of gait and mobility (R26.89);Unsteadiness on feet (R26.81)    Time: 2229-7989 PT Time Calculation (min) (ACUTE ONLY): 34 min   Charges:   PT Evaluation $PT Eval Low Complexity: 1 Low PT Treatments $Gait Training: 8-22 mins       Libbi Towner Conception Chancy, PT, DPT  Pager # [phone]    Cheri Ayotte D Mavryk Pino 07/18/2018, 1:17 PM

## 2018-07-18 NOTE — Progress Notes (Signed)
Patient ID: [name], female   DOB: [birth year], 62 y.o.   MRN: [id number]  PROGRESS NOTE    [name]  OZD:[id number] DOB: 07/08/1956 DOA: 07/17/2018 PCP: [name], MD   Brief Narrative:  62 y.o. female with medical history significant of recent diagnosis of small cell lung cancer diagnosed from recent bronchoscopy and pathology done on 07/10/2018 for which patient saw Dr. Julien Nordmann on 07/15/2018 and was supposed to be started on chemotherapy on 07/24/2018; COPD, alpha-1 antitrypsin deficiency, pulmonary fibrosis, fibromyalgia, dyslipidemia, GERD, irritable bowel syndrome, sleep apnea, vitamin D deficiency, cervical cancer 14 years ago status post resection and recent admission on 07/06/2018 and discharged on 07/10/2018 for electrolyte abnormalities and left lung mass status post bronchoscopy and biopsy presented on 07/17/2018 for worsening shortness of breath cough and chills.  She was admitted for pneumonia and started on intravenous antibiotics.  Assessment & Plan:   Principal Problem:   Postobstructive pneumonia Active Problems:   Hyperlipidemia   Essential hypertension   Hyponatremia   SCLC (small cell lung carcinoma), left (HCC)   Abnormal LFTs   Leukocytosis   HCAP (healthcare-associated pneumonia)   Postobstructive pneumonia/healthcare assistant pneumonia in a patient with history of recent diagnosis of small cell lung cancer -Continue cefepime.  DC vancomycin, MRSA PCR is negative.  Follow cultures.  Follow urine Legionella and streptococcal antigen -Oxygen supplementation as needed   Leukocytosis -Probably secondary to above.  Improving; repeat a.m. labs  Recent diagnosis of small cell lung cancer -Patient was recently evaluated by Dr. Julien Nordmann who is planning to start chemotherapy as an outpatient.  Patient is scheduled for outpatient MRI of the brain and PET scan -Have notified Dr. Julien Nordmann via epic. Outpatient follow-up with Dr. Julien Nordmann  Abnormal  LFTs -Slightly improving.  Might be secondary to metastatic disease versus changes of cirrhosis seen on recent CT scan of the chest.  Right upper quadrant ultrasound and hepatitis profile.  Repeat a.m. LFTs  Hyponatremia, probably chronic -Probably secondary to lung mass.    Improving.  Monitor  Hypokalemia -Replace.  Repeat a.m. labs  COPD in a patient with alpha-1 antitrypsin deficiency -No signs of exacerbation.  Continue home regimen  Thrombocytosis -Probably reactive from lung cancer.  Monitor  Moderate malnutrition -Follow nutrition recommendations    DVT prophylaxis: Lovenox Code Status: Full Family Communication: Spoke to husband at bedside Disposition Plan: Home in 1-2 days pending clinical improvement Consultants: None  Procedures: None  Antimicrobials: Vancomycin and cefepime from 07/17/2018 onwards.   Subjective: Patient seen and examined at bedside.  She feels almost the same as yesterday, not much improvement.  No overnight worsening shortness of breath, cough, fever, nausea or vomiting.  Objective: Vitals:   07/17/18 2126 07/18/18 0507 07/18/18 0915 07/18/18 0919  BP:  115/69    Pulse:  82    Resp:  (!) 24    Temp:  97.6 F (36.4 C)    TempSrc:  Oral    SpO2: 90% 94% 92% 92%  Weight:  76 kg    Height:  5\' 5"  (1.651 m)      Intake/Output Summary (Last 24 hours) at 07/18/2018 1041 Last data filed at 07/18/2018 0600 Gross per 24 hour  Intake 1248.15 ml  Output -  Net 1248.15 ml   Filed Weights   07/17/18 1933 07/18/18 0507  Weight: 76 kg 76 kg    Examination:  General exam: Appears calm and comfortable.  No distress Respiratory system: Bilateral decreased breath sounds at bases with some  scattered crackles, mild intermittent tachypnea Cardiovascular system: S1 & S2 heard, Rate controlled Gastrointestinal system: Abdomen is nondistended, soft and nontender. Normal bowel sounds heard. Extremities: No cyanosis, clubbing, edema   Data  Reviewed: I have personally reviewed following labs and imaging studies  CBC: Recent Labs  Lab 07/15/18 1307 07/16/18 0853 07/17/18 1620 07/18/18 0345  WBC 16.9* 19.7* 22.0* 16.4*  NEUTROABS 13.2* 15.5* 17.4*  --   HGB 12.8 13.0 13.0 11.6*  HCT 37.5 39.9 36.9 33.9*  MCV 84.1 85 82.9 83.9  PLT 444* 473* 534* 170*   Basic Metabolic Panel: Recent Labs  Lab 07/15/18 1307 07/16/18 0853 07/17/18 1620 07/18/18 0345  NA 128* 124* 129* 132*  K 3.6 4.0 3.6 3.1*  CL 88* 83* 88* 93*  CO2 29 25 26 29   GLUCOSE 103* 96 101* 116*  BUN 10 8 10 10   CREATININE 0.67 0.50* 0.60 0.48  CALCIUM 9.6 8.8 9.1 8.3*   GFR: Estimated Creatinine Clearance: 76.3 mL/min (by C-G formula based on SCr of 0.48 mg/dL). Liver Function Tests: Recent Labs  Lab 07/15/18 1307 07/16/18 0853 07/17/18 1620 07/18/18 0345  AST 118* 126* 126* 112*  ALT 41 41* 47* 41  ALKPHOS 364* 394* 361* 313*  BILITOT 0.8 0.8 1.2 1.0  PROT 7.5 6.5 7.0 5.8*  ALBUMIN 2.4* 3.1* 2.6* 2.1*   No results for input(s): LIPASE, AMYLASE in the last 168 hours. No results for input(s): AMMONIA in the last 168 hours. Coagulation Profile: No results for input(s): INR, PROTIME in the last 168 hours. Cardiac Enzymes: No results for input(s): CKTOTAL, CKMB, CKMBINDEX, TROPONINI in the last 168 hours. BNP (last 3 results) No results for input(s): PROBNP in the last 8760 hours. HbA1C: No results for input(s): HGBA1C in the last 72 hours. CBG: No results for input(s): GLUCAP in the last 168 hours. Lipid Profile: No results for input(s): CHOL, HDL, LDLCALC, TRIG, CHOLHDL, LDLDIRECT in the last 72 hours. Thyroid Function Tests: No results for input(s): TSH, T4TOTAL, FREET4, T3FREE, THYROIDAB in the last 72 hours. Anemia Panel: No results for input(s): VITAMINB12, FOLATE, FERRITIN, TIBC, IRON, RETICCTPCT in the last 72 hours. Sepsis Labs: Recent Labs  Lab 07/17/18 1629 07/17/18 1942 07/18/18 0345  PROCALCITON  --  1.97 1.90   LATICACIDVEN 1.64  --   --     Recent Results (from the past 240 hour(s))  Culture, respiratory (non-expectorated)     Status: None   Collection Time: 07/10/18 11:35 AM  Result Value Ref Range Status   Specimen Description   Final    LUNG LEFT LOWER Performed at [facility] [address], [place], [place] [address]    Special Requests   Final    NONE Performed at [facility], [place] [address] [place], [place] [address]    Gram Stain   Final    RARE WBC PRESENT, PREDOMINANTLY PMN NO ORGANISMS SEEN Performed at [facility], [place] [address], [place], [place] [address]    Culture   Final    FEW HAEMOPHILUS INFLUENZAE BETA LACTAMASE NEGATIVE    Report Status 07/12/2018 FINAL  Final  Fungus Culture With Stain     Status: None (Preliminary result)   Collection Time: 07/10/18 11:35 AM  Result Value Ref Range Status   Fungus Stain Final report  Final    Comment: (NOTE) Performed At: [facility] [place], [place] [id number] Rush Farmer MD TS:[id number]    Fungus (Mycology) Culture PENDING  Incomplete  Fungal Source LEFT  Final    Comment: LOWER LUNG Performed at [facility] [address], [place], [place] [address]   Fungus Culture Result     Status: None   Collection Time: 07/10/18 11:35 AM  Result Value Ref Range Status   Result 1 Comment  Final    Comment: (NOTE) KOH/Calcofluor preparation:  no fungus observed. Performed At: [facility] [address] [place], [place] [id number] Rush Farmer MD OE:[id number]   Culture, blood (single)     Status: None (Preliminary result)   Collection Time: 07/17/18  4:39 PM  Result Value Ref Range Status   Specimen Description   Final    BLOOD LEFT FOREARM Performed at [facility], [place] [address] [place], [place] [address]    Special Requests   Final    BOTTLES DRAWN AEROBIC AND ANAEROBIC Blood Culture  adequate volume Performed at [facility] [address] [place], [place] [address]    Culture   Final    NO GROWTH < 12 HOURS Performed at [facility] [address], [place], [place] [address]    Report Status PENDING  Incomplete  MRSA PCR Screening     Status: None   Collection Time: 07/17/18  6:23 PM  Result Value Ref Range Status   MRSA by PCR NEGATIVE NEGATIVE Final    Comment:        The GeneXpert MRSA Assay (FDA approved for NASAL specimens only), is one component of a comprehensive MRSA colonization surveillance program. It is not intended to diagnose MRSA infection nor to guide or monitor treatment for MRSA infections. Performed at [facility], [place] [address] [place], [place] [address]   Culture, blood (routine x 2) Call MD if unable to obtain prior to antibiotics being given     Status: None (Preliminary result)   Collection Time: 07/17/18  7:42 PM  Result Value Ref Range Status   Specimen Description   Final    BLOOD RIGHT ANTECUBITAL Performed at [facility] [address], [place], [place] [address]    Special Requests   Final    BOTTLES DRAWN AEROBIC AND ANAEROBIC Blood Culture adequate volume Performed at [facility] [address] [place], [place] [address]    Culture   Final    NO GROWTH < 12 HOURS Performed at [facility] [address], [place], [place] [address]    Report Status PENDING  Incomplete         Radiology Studies: Dg Chest 2 View  Result Date: 07/17/2018 CLINICAL DATA:  Fever, dyspnea, previous smoker and COPD. EXAM: CHEST - 2 VIEW COMPARISON:  07/16/2018 FINDINGS: Increased pulmonary consolidation and opacities at the left lung base since previous exam suggesting some interval progression of suspected pneumonia and/or atelectasis. Mild vascular congestion is also noted slightly increased in appearance since prior. Heart size is within  normal limits. Mild aortic atherosclerosis without aneurysm. No aggressive osseous lesions. IMPRESSION: 1. There has been some interval progression of airspace disease at the left lung base with a more consolidated appearance suggesting some worsening of pneumonia. 2. Pulmonary vascular congestion is also noted slightly more accentuated on prior exam. 3.  Aortic atherosclerosis (ICD10-I70.0) Electronically Signed   By: Ashley Royalty M.D.   On: 07/17/2018 17:02        Scheduled Meds: . albuterol  2.5 mg Nebulization TID  . amitriptyline  75 mg Oral QHS  .  enoxaparin (LOVENOX) injection  40 mg Subcutaneous Q24H  . ezetimibe  10 mg Oral Daily  . feeding supplement (ENSURE ENLIVE)  237 mL Oral BID BM  . fluticasone  2 spray Each Nare Daily  . folic acid  1 mg Oral Daily  . HYDROcodone-acetaminophen  1 tablet Oral TID  . mometasone-formoterol  2 puff Inhalation BID  . pantoprazole  40 mg Oral Daily  . potassium chloride  40 mEq Oral Q4H  . rosuvastatin  20 mg Oral q1800   Continuous Infusions: . ceFEPime (MAXIPIME) IV Stopped (07/18/18 0521)  . ceFEPime (MAXIPIME) IV       LOS: 1 day        Aline August, MD [facility] Pager [phone]  If 7PM-7AM, please contact night-coverage [email or web address] Password [email or web address] 07/18/2018, 10:41 AM

## 2018-07-19 ENCOUNTER — Inpatient Hospital Stay (HOSPITAL_COMMUNITY): Payer: Medicare Other

## 2018-07-19 ENCOUNTER — Inpatient Hospital Stay: Payer: Medicare Other

## 2018-07-19 ENCOUNTER — Other Ambulatory Visit: Payer: Self-pay | Admitting: *Deleted

## 2018-07-19 ENCOUNTER — Ambulatory Visit (HOSPITAL_COMMUNITY): Admission: RE | Admit: 2018-07-19 | Payer: Medicare Other | Source: Ambulatory Visit

## 2018-07-19 DIAGNOSIS — R0902 Hypoxemia: Secondary | ICD-10-CM

## 2018-07-19 DIAGNOSIS — C3492 Malignant neoplasm of unspecified part of left bronchus or lung: Secondary | ICD-10-CM

## 2018-07-19 LAB — CBC WITH DIFFERENTIAL/PLATELET
BASOS PCT: 0 %
Basophils Absolute: 0 10*3/uL (ref 0.0–0.1)
EOS PCT: 1 %
Eosinophils Absolute: 0.1 10*3/uL (ref 0.0–0.7)
HEMATOCRIT: 35.2 % — AB (ref 36.0–46.0)
Hemoglobin: 11.7 g/dL — ABNORMAL LOW (ref 12.0–15.0)
LYMPHS ABS: 2.9 10*3/uL (ref 0.7–4.0)
Lymphocytes Relative: 20 %
MCH: 28.6 pg (ref 26.0–34.0)
MCHC: 33.2 g/dL (ref 30.0–36.0)
MCV: 86.1 fL (ref 78.0–100.0)
Monocytes Absolute: 1.3 10*3/uL — ABNORMAL HIGH (ref 0.1–1.0)
Monocytes Relative: 9 %
NEUTROS PCT: 70 %
Neutro Abs: 10 10*3/uL — ABNORMAL HIGH (ref 1.7–7.7)
Platelets: 473 10*3/uL — ABNORMAL HIGH (ref 150–400)
RBC: 4.09 MIL/uL (ref 3.87–5.11)
RDW: 17.8 % — ABNORMAL HIGH (ref 11.5–15.5)
WBC: 14.3 10*3/uL — ABNORMAL HIGH (ref 4.0–10.5)

## 2018-07-19 LAB — COMPREHENSIVE METABOLIC PANEL
ALT: 51 U/L — ABNORMAL HIGH (ref 0–44)
AST: 140 U/L — ABNORMAL HIGH (ref 15–41)
Albumin: 1.9 g/dL — ABNORMAL LOW (ref 3.5–5.0)
Alkaline Phosphatase: 334 U/L — ABNORMAL HIGH (ref 38–126)
Anion gap: 9 (ref 5–15)
BILIRUBIN TOTAL: 0.6 mg/dL (ref 0.3–1.2)
BUN: 9 mg/dL (ref 6–20)
CO2: 28 mmol/L (ref 22–32)
CREATININE: 0.41 mg/dL — AB (ref 0.44–1.00)
Calcium: 8.3 mg/dL — ABNORMAL LOW (ref 8.9–10.3)
Chloride: 95 mmol/L — ABNORMAL LOW (ref 98–111)
GFR calc Af Amer: >60 mL/min (ref >60)
Glucose, Bld: 98 mg/dL (ref 70–99)
Potassium: 3.8 mmol/L (ref 3.5–5.1)
Sodium: 132 mmol/L — ABNORMAL LOW (ref 135–145)
TOTAL PROTEIN: 5.9 g/dL — AB (ref 6.5–8.1)

## 2018-07-19 LAB — MAGNESIUM: Magnesium: 1.8 mg/dL (ref 1.7–2.4)

## 2018-07-19 LAB — PROCALCITONIN: Procalcitonin: 1.58 ng/mL

## 2018-07-19 MED ORDER — CEFUROXIME AXETIL 500 MG PO TABS: 500.0000 mg | ORAL_TABLET | Freq: Two times a day (BID) | ORAL | 0 refills | Status: AC

## 2018-07-19 NOTE — Care Management (Addendum)
PT recommendations gone over with pt and spouse at bedside. Pt offered choice for home health services and [facility] chosen. [facility] rep contacted for referral. Pt states that she has a RW and 3in1 at home. Per desaturation screen done by nursing, pt qualifies for home 02. MD order received and [facility] rep alerted of need for home 02. Marney Doctor RN,BSN

## 2018-07-19 NOTE — Progress Notes (Signed)
Pharmacy Antibiotic Note  [name] is a 62 y.o. female recently diagnosed with lung cancer who saw her oncologist on 8/27 for SOB.  She was prescribed doxycycline for suspected PNA. She presented to the ED on 07/17/2018 d/t worsening of symptoms.  Vancomycin and cefepime started on admission for suspected obstructive PNA.  MRSA pcr was negative with vancomycin d/ced on 8/29.  Today, 07/19/2018: - day #2 abx - afeb, wbc elevated but down to 14.3 - scr low (crcl~77) - all cultures have been negative thus far - 8/28 CXR: "some interval progression of airspace disease at the left lung base with a more consolidated appearance suggesting some worsening of pneumonia. "  Plan: - continue cefepime 1 gm IV q8h - f/u renal function, culture results and LOT for abx  ____________________________________  Height: 5\' 5"  (165.1 cm) Weight: 171 lb 15.3 oz (78 kg) IBW/kg (Calculated) : 57  Temp (24hrs), Avg:98.1 F (36.7 C), Min:97.9 F (36.6 C), Max:98.4 F (36.9 C)  Recent Labs  Lab 07/15/18 1307 07/16/18 0853 07/17/18 1620 07/17/18 1629 07/18/18 0345 07/19/18 0355  WBC 16.9* 19.7* 22.0*  --  16.4* 14.3*  CREATININE 0.67 0.50* 0.60  --  0.48 0.41*  LATICACIDVEN  --   --   --  1.64  --   --     Estimated Creatinine Clearance: 77.2 mL/min (A) (by C-G formula based on SCr of 0.41 mg/dL (L)).    Allergies  Allergen Reactions  . Penicillins Nausea And Vomiting    Has patient had a PCN reaction causing immediate rash, facial/tongue/throat swelling, SOB or lightheadedness with hypotension: Unknown Has patient had a PCN reaction causing severe rash involving mucus membranes or skin necrosis: Unknown Has patient had a PCN reaction that required hospitalization: Unknown Has patient had a PCN reaction occurring within the last 10 years: Unknown If all of the above answers are "NO", then may proceed with Cephalosporin use.   Marland Kitchen Actonel [Risedronate Sodium]     Caused her to retain fluid  and stomach problems  . Advair Diskus [Fluticasone-Salmeterol]   . Alendronate Sodium     Stomach upset / esophageal burning  . Aspirin Nausea Only    Stomach burns; abdominal pain  . Azithromycin Nausea And Vomiting  . Levofloxacin     Reports hx of GI distress and GI bleed  . Morphine Itching and Swelling    Throat swells  . Nsaids     Swelling, GI upset   . Omnicef [Cefdinir]   . Erythromycin Rash  . Iodine Rash    Topical and IV    Antimicrobials this admission: 8/28 aztreonam x 1 dose 8/28 vanc>> 8/29 8/28 cefepime>>  Dose adjustments this admission: --  Microbiology results: 8/28 at 1942 BCx: 8/28 at 1639 bcx:  8/28 influenza pcr: neg 8/28 HIV antb:NR 8/28 MRSA PCR: neg  Thank you for allowing pharmacy to be a part of this patient's care.  Lynelle Doctor 07/19/2018 7:32 AM

## 2018-07-19 NOTE — Discharge Summary (Signed)
Physician Discharge Summary  ACHAIA GARLOCK WNI:[id number] DOB: [birth year] DOA: 07/17/2018  PCP: [name], MD  Admit date: 07/17/2018 Discharge date: 07/19/2018  Admitted From: Home Disposition:  Home  Recommendations for Outpatient Follow-up:  1. Follow up with PCP in 1 week 2. Follow-up with oncology as scheduled 3. Follow-up in the ED if symptoms worsen or new.   Home Health: No  Equipment/Devices: Oxygen via nasal cannula at 2 L/min  Discharge Condition: Guarded CODE STATUS: Full Diet recommendation: Heart Healthy  Brief/Interim Summary: 62 y.o.femalewith medical history significant ofrecent diagnosis of small cell lung cancer diagnosed from recent bronchoscopy and pathology done on 07/10/2018 for which patient saw Dr. Julien Nordmann on 07/15/2018 and was supposed to be started on chemotherapy on 07/24/2018;COPD, alpha-1 antitrypsin deficiency, pulmonary fibrosis, fibromyalgia, dyslipidemia, GERD, irritable bowel syndrome, sleep apnea, vitamin D deficiency, cervical cancer 14 years ago status post resection and recent admission on 07/06/2018 and discharged on 07/10/2018 for electrolyte abnormalities and left lung mass status post bronchoscopy and biopsy presented on 07/17/2018 for worsening shortness of breath cough and chills.  She was admitted for pneumonia and started on intravenous antibiotics.  Her condition gradually improved, vancomycin was discontinued.  Cultures have been negative so far.  She will require home oxygen on discharge.  She will be discharged on oral antibiotics.   Discharge Diagnoses:  Principal Problem:   Postobstructive pneumonia Active Problems:   Hyperlipidemia   Essential hypertension   Hyponatremia   SCLC (small cell lung carcinoma), left (HCC)   Abnormal LFTs   Leukocytosis   HCAP (healthcare-associated pneumonia)  Postobstructive pneumonia/healthcare associated pneumonia in a patient with history of recent diagnosis of small cell lung cancer  causing hypoxia -Started cefepime and vancomycin on admission and vancomycin was subsequently discontinued.  MRSA PCR is negative.  -Cultures have been negative so far -Still requiring oxygen via nasal cannula at 2 L/min.  Patient desaturated to the 80s on ambulation so will require the same amount of oxygen on discharge. -Discharge home on Ceftin for 5 more days  Leukocytosis -Probably secondary to above.  Improving.  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 Julien Nordmann who is planning to start chemotherapy as an outpatient. Patient is scheduled for outpatient MRI of the brain and PET scan -Have notified Dr. Julien Nordmann via epic.Outpatient follow-up with Dr. Julien Nordmann  Abnormal LFTs -Slightly improving.   -Right upper quadrant ultrasound reveals probable metastatic disease. Outpatient follow-up with oncology   hyponatremia, probably chronic -Probably secondary to lung mass.   Improving.  Outpatient follow-up  Hypokalemia -Improved  COPD inapatient with alpha-1 antitrypsin deficiency -No signs of exacerbation. Continue home regimen  Thrombocytosis -Probably reactive from lung cancer. Outpatient follow-up  Moderate malnutrition -Follow nutrition recommendations  Discharge Instructions  Discharge Instructions    Call MD for:  difficulty breathing, headache or visual disturbances   Complete by:  As directed    Call MD for:  extreme fatigue   Complete by:  As directed    Call MD for:  hives   Complete by:  As directed    Call MD for:  persistant dizziness or light-headedness   Complete by:  As directed    Call MD for:  persistant nausea and vomiting   Complete by:  As directed    Call MD for:  severe uncontrolled pain   Complete by:  As directed    Call MD for:  temperature >100.4   Complete  by:  As directed    Diet - low sodium heart healthy   Complete by:  As directed     Diet - low sodium heart healthy   Complete by:  As directed    For home use only DME oxygen   Complete by:  As directed    Mode or (Route):  Nasal cannula   Liters per Minute:  2   Frequency:  Continuous (stationary and portable oxygen unit needed)   Oxygen conserving device:  Yes   Oxygen delivery system:  Gas   Increase activity slowly   Complete by:  As directed    Increase activity slowly   Complete by:  As directed      Allergies as of 07/19/2018      Reactions   Penicillins Nausea And Vomiting   Has patient had a PCN reaction causing immediate rash, facial/tongue/throat swelling, SOB or lightheadedness with hypotension: Unknown Has patient had a PCN reaction causing severe rash involving mucus membranes or skin necrosis: Unknown Has patient had a PCN reaction that required hospitalization: Unknown Has patient had a PCN reaction occurring within the last 10 years: Unknown If all of the above answers are "NO", then may proceed with Cephalosporin use.   Actonel [risedronate Sodium]    Caused her to retain fluid and stomach problems   Advair Diskus [fluticasone-salmeterol]    Alendronate Sodium    Stomach upset / esophageal burning   Aspirin Nausea Only   Stomach burns; abdominal pain   Azithromycin Nausea And Vomiting   Levofloxacin    Reports hx of GI distress and GI bleed   Morphine Itching, Swelling   Throat swells   Nsaids    Swelling, GI upset   Omnicef [cefdinir]    Erythromycin Rash   Iodine Rash   Topical and IV      Medication List    STOP taking these medications   doxycycline 100 MG tablet Commonly known as:  VIBRA-TABS     TAKE these medications   amitriptyline 75 MG tablet Commonly known as:  ELAVIL Take 75 mg by mouth at bedtime.   benzonatate 100 MG capsule Commonly known as:  TESSALON Take 1 capsule (100 mg total) by mouth 3 (three) times daily as needed for cough.   BEVESPI AEROSPHERE 9-4.8 MCG/ACT Aero Generic drug:   Glycopyrrolate-Formoterol 2 PUFFS 2 TIMES A DAY What changed:  See the new instructions.   cefUROXime 500 MG tablet Commonly known as:  CEFTIN Take 1 tablet (500 mg total) by mouth 2 (two) times daily for 5 days.   Compressor Nebulizer Misc 1 Device by Does not apply route as needed.   ezetimibe 10 MG tablet Commonly known as:  ZETIA TAKE 1 TABLET ONCE A DAY   feeding supplement (ENSURE ENLIVE) Liqd Take 237 mLs by mouth 2 (two) times daily between meals.   fluticasone 50 MCG/ACT nasal spray Commonly known as:  FLONASE USE 2 SPRAYS IN EACH NOSTRIL ONCE DAILY.   folic acid 1 MG tablet Commonly known as:  FOLVITE Take 1 mg by mouth daily.   HYDROcodone-acetaminophen 5-325 MG tablet Commonly known as:  NORCO/VICODIN Take 1 tablet by mouth 3 (three) times daily.   omeprazole 40 MG capsule Commonly known as:  PRILOSEC TAKE (1) CAPSULE DAILY What changed:  See the new instructions.   PROAIR HFA 108 (90 Base) MCG/ACT inhaler Generic drug:  albuterol 2 PUFFS EVERY 4 HOURS AS NEEDED FOR WHEEZING What changed:  See the new instructions.  albuterol (2.5 MG/3ML) 0.083% nebulizer solution Commonly known as:  PROVENTIL Take 3 mLs (2.5 mg total) by nebulization every 6 (six) hours as needed. What changed:  Another medication with the same name was changed. Make sure you understand how and when to take each.   prochlorperazine 10 MG tablet Commonly known as:  COMPAZINE Take 1 tablet (10 mg total) by mouth every 6 (six) hours as needed for nausea or vomiting.   rosuvastatin 40 MG tablet Commonly known as:  CRESTOR TAKE 1/2 TABLET ONCE DAILY            Durable Medical Equipment  (From admission, onward)         Start     Ordered   07/19/18 0000  For home use only DME oxygen    Question Answer Comment  Mode or (Route) Nasal cannula   Liters per Minute 2   Frequency Continuous (stationary and portable oxygen unit needed)   Oxygen conserving device Yes   Oxygen  delivery system Gas      07/19/18 1035         Follow-up Information    [name], MD. Schedule an appointment as soon as possible for a visit in 1 week(s).   Specialty:  Family Medicine Contact information: [place] [address] [phone]        Curt Bears, MD Follow up.   Specialty:  Oncology Why:  keep scheduled appointment Contact information: [place] [address] [place] Follow up.   Specialty:  [place] Why:  for home health physical therapy Contact information: [place] [address] [phone]          Allergies  Allergen Reactions  . Penicillins Nausea And Vomiting    Has patient had a PCN reaction causing immediate rash, facial/tongue/throat swelling, SOB or lightheadedness with hypotension: Unknown Has patient had a PCN reaction causing severe rash involving mucus membranes or skin necrosis: Unknown Has patient had a PCN reaction that required hospitalization: Unknown Has patient had a PCN reaction occurring within the last 10 years: Unknown If all of the above answers are "NO", then may proceed with Cephalosporin use.   Marland Kitchen Actonel [Risedronate Sodium]     Caused her to retain fluid and stomach problems  . Advair Diskus [Fluticasone-Salmeterol]   . Alendronate Sodium     Stomach upset / esophageal burning  . Aspirin Nausea Only    Stomach burns; abdominal pain  . Azithromycin Nausea And Vomiting  . Levofloxacin     Reports hx of GI distress and GI bleed  . Morphine Itching and Swelling    Throat swells  . Nsaids     Swelling, GI upset   . Omnicef [Cefdinir]   . Erythromycin Rash  . Iodine Rash    Topical and IV    Consultations:  None   Procedures/Studies: Dg Chest 2 View  Result Date: 07/17/2018 CLINICAL DATA:  Fever, dyspnea, previous smoker and COPD. EXAM: CHEST - 2 VIEW COMPARISON:  07/16/2018  FINDINGS: Increased pulmonary consolidation and opacities at the left lung base since previous exam suggesting some interval progression of suspected pneumonia and/or atelectasis. Mild vascular congestion is also noted slightly increased in appearance since prior. Heart size is within normal limits. Mild aortic atherosclerosis without aneurysm. No aggressive osseous lesions. IMPRESSION: 1. There has been some interval progression of airspace disease at the left lung  base with a more consolidated appearance suggesting some worsening of pneumonia. 2. Pulmonary vascular congestion is also noted slightly more accentuated on prior exam. 3.  Aortic atherosclerosis (ICD10-I70.0) Electronically Signed   By: Ashley Royalty M.D.   On: 07/17/2018 17:02   Dg Chest 2 View  Result Date: 07/16/2018 CLINICAL DATA:  Elevated white blood cell count. Recently diagnosed with left-sided small cell lung malignancy. History of COPD, former smoker. EXAM: CHEST - 2 VIEW COMPARISON:  Portable chest x-ray of July 10, 2018. Chest CT scan of July 06, 2018. FINDINGS: There is progressive increased interstitial and airspace opacity in the left lower lobe. A small amount of increased density in the left upper lobe is stable since the previous CT scan. The right lung is mildly hyperinflated and clear. A small left pleural effusion is suspected. The heart and pulmonary vascularity are normal. There is calcification in the wall of the aortic arch. The bony thorax exhibits no acute abnormality. IMPRESSION: Progressive interstitial and airspace opacities in the left lower lobe worrisome for pneumonia. This may reflect a post obstructive process given the significant left-sided mediastinal lymphadenopathy described on the CT scan of July 06, 2018. A small amount of increased density in the left upper lobe may reflect atelectasis or pneumonia. Underlying COPD-smoking related changes. Thoracic aortic atherosclerosis. Electronically Signed   By:  David  Martinique M.D.   On: 07/16/2018 09:31   Ct Chest W Contrast  Addendum Date: 07/06/2018   ADDENDUM REPORT: 07/06/2018 18:12 ADDENDUM: These results were called by telephone at the time of interpretation on 07/06/2018 at 5:20 pm to Dr. Nehemiah Settle, who verbally acknowledged these results. Electronically Signed   By: Fidela Salisbury M.D.   On: 07/06/2018 18:12   Result Date: 07/06/2018 CLINICAL DATA:  Elevated liver function tests. EXAM: CT CHEST WITH CONTRAST TECHNIQUE: Multidetector CT imaging of the chest was performed during intravenous contrast administration. CONTRAST:  69mL OMNIPAQUE IOHEXOL 300 MG/ML  SOLN COMPARISON:  Abdominal series radiograph 07/06/2018 FINDINGS: Cardiovascular: The heart is normal in size. No pericardial effusion. Calcific atherosclerotic disease of the coronary arteries and aorta, mild. Mediastinum/Nodes: Left-sided mediastinal lymphadenopathy at all lymph node station. Index abnormal lymph node in the AP window measures 2.2 cm in short axis. Subcarinal lymphadenopathy is also present. In the left hilar/subhilar region there is a soft tissue mass versus conglomerate of abnormal lymph nodes which measures approximately 6.0 x 3.1 x 5.8 cm. This mass causes compression of the left lower lobar pulmonary arterial branches and left lower lobar main bronchus, with subsequent complete collapse of the left lower lobe. There is also mass effect to the lower esophagus. Lungs/Pleura: Post compressive atelectasis of the left lower lobe. Additionally, there are multiple areas of ground-glass opacity in bilateral lungs. An index lesion in the periphery of the right upper lobe measures 4.0 x 3.8 cm, image 42/155. Upper Abdomen: Diffusely heterogeneous nodular appearance of the liver with lobulated contours. 1.3 cm in short axis lymph node in the gastrohepatic legs. Other small lymph nodes in porta hepatis. Musculoskeletal: No chest wall abnormality. No acute or significant osseous findings.  IMPRESSION: Predominantly left-sided mediastinal, hilar and subcarinal lymphadenopathy. A 6 cm left hilar/subhilar mass may represent a primary malignancy versus a conglomerate of abnormal lymph nodes. It causes compression of the left lower lobe pulmonary arterial branches, left lower lobe main bronchus and the lower esophagus. Post compressive complete collapse of the left lower lobe. Multiple areas of ground-glass opacity in bilateral lungs. The index lesion in the periphery  of the right upper lobe measures 4 cm. These may represent infectious or inflammatory consolidation, however bronchoalveolar carcinoma cannot be excluded. Diffusely heterogeneous nodular appearance of the liver. This may represent changes of cirrhosis or alternatively diffuse hepatic metastatic disease. Moderate lymphadenopathy in the upper abdomen. Bronchoscopy with tissue sampling may be considered to arrive to a histologic diagnosis. Aortic Atherosclerosis (ICD10-I70.0). Electronically Signed: By: Fidela Salisbury M.D. On: 07/06/2018 17:12   Dg Chest Port 1 View  Result Date: 07/10/2018 CLINICAL DATA:  Post bronchoscopy and left lung biopsy. Now with increased cough. EXAM: PORTABLE CHEST 1 VIEW COMPARISON:  Portable chest x-ray of July 08, 2018 FINDINGS: The right lung is well-expanded. Coarse lung markings in the right upper lobe persists. On the left there is patchy density at the left lung base which is stable. The interstitial markings are slightly more in the left lung. There is no pneumothorax, pneumomediastinum, or pleural effusion. The heart and pulmonary vascularity are normal. IMPRESSION: There is no postprocedure complication following bronchoscopy on the left with lung biopsies. Electronically Signed   By: David  Martinique M.D.   On: 07/10/2018 12:45   Dg Chest Port 1 View  Result Date: 07/08/2018 CLINICAL DATA:  Rhonchi. EXAM: PORTABLE CHEST 1 VIEW COMPARISON:  None. FINDINGS: The heart size and mediastinal contours  are within normal limits. No pneumothorax or pleural effusion is noted. Right lung is clear. Mild left basilar subsegmental atelectasis is noted. The visualized skeletal structures are unremarkable. IMPRESSION: Mild left basilar subsegmental atelectasis. Electronically Signed   By: Marijo Conception, M.D.   On: 07/08/2018 07:22   Dg Abd Acute W/chest  Result Date: 07/06/2018 CLINICAL DATA:  Nodule vomiting 2 months. EXAM: DG ABDOMEN ACUTE W/ 1V CHEST COMPARISON:  Chest x-ray 06/12/2017 FINDINGS: Lungs are adequately inflated with 1 cm nodule opacity over the lateral left midlung. Linear scarring lateral left base. Subtle hazy density over the right upper lobe. No effusion. Cardiac silhouette is within normal. Mild increased density over the AP window. Bowel gas pattern is nonobstructive. No free peritoneal air. Multiple surgical clips over the pelvis. Degenerate change of the spine and hips. Interbody fusion of L4-5. IMPRESSION: Subtle hazy density over the right upper lobe which may be due to acute infection. 1 cm nodular opacity over the lateral left midlung. Increased density and prominence of the AP window as cannot exclude mass/adenopathy. Consider contrast-enhanced chest CT for further evaluation. Nonobstructive bowel gas pattern. Electronically Signed   By: Marin Olp M.D.   On: 07/06/2018 14:43   US Abdomen Limited Ruq  Result Date: 07/19/2018 CLINICAL DATA:  Abnormal LFTs, history COPD, pulmonary fibrosis, cervical cancer, hyperlipidemia, GERD, irritable bowel syndrome EXAM: ULTRASOUND ABDOMEN LIMITED RIGHT UPPER QUADRANT COMPARISON:  CT abdomen and pelvis 07/17/2013 FINDINGS: Gallbladder: Normally distended without stones or wall thickening. No pericholecystic fluid or sonographic Murphy sign. Common bile duct: Diameter: 2 mm diameter, normal Liver: Heterogeneous echogenicity. Multiple hepatic nodules are identified, ranging from hypoechoic to mildly hyperechoic and measuring up to 2.1 cm in size.  Lesions are suspicious for hepatic metastatic disease and are new since the prior CT. Portal vein is patent on color Doppler imaging with normal direction of blood flow towards the liver. No RIGHT upper quadrant free fluid. Visualized portion of pancreas normal appearance. IMPRESSION: Heterogeneous hepatic echogenicity with multiple small hepatic nodules suspicious for metastatic disease; further evaluation by CT imaging with IV and oral contrast recommended. Electronically Signed   By: Lavonia Dana M.D.   On: 07/19/2018 08:16  Subjective: Patient seen and examined at bedside.  She feels better and is okay to go home.  No overnight fever, nausea, vomiting or worsening shortness of breath or cough  Discharge Exam: Vitals:   07/19/18 0557 07/19/18 0832  BP: 120/79   Pulse: 86   Resp: 18   Temp: 98 F (36.7 C)   SpO2: 93% 90%   Vitals:   07/18/18 2019 07/19/18 0551 07/19/18 0557 07/19/18 0832  BP: 109/61  120/79   Pulse: 100  86   Resp: 18  18   Temp: 98.4 F (36.9 C)  98 F (36.7 C)   TempSrc: Oral  Oral   SpO2: 95%  93% 90%  Weight:  78 kg    Height:        General: Pt is alert, awake, not in acute distress Cardiovascular: rate controlled, S1/S2 + Respiratory: bilateral decreased breath sounds at bases with some scattered crackles Abdominal: Soft, NT, ND, bowel sounds + Extremities: no edema, no cyanosis    The results of significant diagnostics from this hospitalization (including imaging, microbiology, ancillary and laboratory) are listed below for reference.     Microbiology: Recent Results (from the past 240 hour(s))  Culture, respiratory (non-expectorated)     Status: None   Collection Time: 07/10/18 11:35 AM  Result Value Ref Range Status   Specimen Description   Final    LUNG LEFT LOWER Performed at [facility] [address], [place], [place] [address]    Special Requests   Final    NONE Performed at [facility], [place] [address], [place], [place] [address]    Gram Stain   Final    RARE WBC PRESENT, PREDOMINANTLY PMN NO ORGANISMS SEEN Performed at [facility], [place] [address] [place], [place] [address]    Culture   Final    FEW HAEMOPHILUS INFLUENZAE BETA LACTAMASE NEGATIVE    Report Status 07/12/2018 FINAL  Final  Fungus Culture With Stain     Status: None (Preliminary result)   Collection Time: 07/10/18 11:35 AM  Result Value Ref Range Status   Fungus Stain Final report  Final    Comment: (NOTE) Performed At: [facility] [place], [place] [id number] Rush Farmer MD [id number]    Fungus (Mycology) Culture PENDING  Incomplete   Fungal Source LEFT  Final    Comment: LOWER LUNG Performed at [facility], [place] [address], [place], [place] [address]   Fungus Culture Result     Status: None   Collection Time: 07/10/18 11:35 AM  Result Value Ref Range Status   Result 1 Comment  Final    Comment: (NOTE) KOH/Calcofluor preparation:  no fungus observed. Performed At: [facility] [address] [place], [place] [id number] Rush Farmer MD [id number]   Culture, blood (single)     Status: None (Preliminary result)   Collection Time: 07/17/18  4:39 PM  Result Value Ref Range Status   Specimen Description   Final    BLOOD LEFT FOREARM Performed at [facility], [place] [address], [place], [place] [address]    Special Requests   Final    BOTTLES DRAWN AEROBIC AND ANAEROBIC Blood Culture adequate volume Performed at [place] [address] [place], [place] [address]    Culture   Final    NO GROWTH < 12 HOURS Performed at [place] [address], [place], [place] [address]  Report Status PENDING  Incomplete  MRSA PCR Screening     Status: None   Collection Time: 07/17/18  6:23 PM  Result Value Ref Range Status   MRSA by PCR NEGATIVE NEGATIVE Final    Comment:        The  GeneXpert MRSA Assay (FDA approved for NASAL specimens only), is one component of a comprehensive MRSA colonization surveillance program. It is not intended to diagnose MRSA infection nor to guide or monitor treatment for MRSA infections. Performed at [facility], [place] [address] [place], [place] [address]   Culture, blood (routine x 2) Call MD if unable to obtain prior to antibiotics being given     Status: None (Preliminary result)   Collection Time: 07/17/18  7:42 PM  Result Value Ref Range Status   Specimen Description   Final    BLOOD RIGHT ANTECUBITAL Performed at [place] [address], [place], [place] [address]    Special Requests   Final    BOTTLES DRAWN AEROBIC AND ANAEROBIC Blood Culture adequate volume Performed at [facility] [address] [place], [place] [address]    Culture   Final    NO GROWTH < 12 HOURS Performed at [facility] [address], [place], [place] [address]    Report Status PENDING  Incomplete     Labs: BNP (last 3 results) Recent Labs    07/07/18 0922 07/08/18 0839  BNP 28.4 94.8   Basic Metabolic Panel: Recent Labs  Lab 07/15/18 1307 07/16/18 0853 07/17/18 1620 07/18/18 0345 07/19/18 0355  NA 128* 124* 129* 132* 132*  K 3.6 4.0 3.6 3.1* 3.8  CL 88* 83* 88* 93* 95*  CO2 29 25 26 29 28   GLUCOSE 103* 96 101* 116* 98  BUN 10 8 10 10 9   CREATININE 0.67 0.50* 0.60 0.48 0.41*  CALCIUM 9.6 8.8 9.1 8.3* 8.3*  MG  --   --   --   --  1.8   Liver Function Tests: Recent Labs  Lab 07/15/18 1307 07/16/18 0853 07/17/18 1620 07/18/18 0345 07/19/18 0355  AST 118* 126* 126* 112* 140*  ALT 41 41* 47* 41 51*  ALKPHOS 364* 394* 361* 313* 334*  BILITOT 0.8 0.8 1.2 1.0 0.6  PROT 7.5 6.5 7.0 5.8* 5.9*  ALBUMIN 2.4* 3.1* 2.6* 2.1* 1.9*   No results for input(s): LIPASE, AMYLASE in the last 168 hours. No results for input(s): AMMONIA in the last 168  hours. CBC: Recent Labs  Lab 07/15/18 1307 07/16/18 0853 07/17/18 1620 07/18/18 0345 07/19/18 0355  WBC 16.9* 19.7* 22.0* 16.4* 14.3*  NEUTROABS 13.2* 15.5* 17.4*  --  10.0*  HGB 12.8 13.0 13.0 11.6* 11.7*  HCT 37.5 39.9 36.9 33.9* 35.2*  MCV 84.1 85 82.9 83.9 86.1  PLT 444* 473* 534* 503* 473*   Cardiac Enzymes: No results for input(s): CKTOTAL, CKMB, CKMBINDEX, TROPONINI in the last 168 hours. BNP: Invalid input(s): POCBNP CBG: No results for input(s): GLUCAP in the last 168 hours. D-Dimer No results for input(s): DDIMER in the last 72 hours. Hgb A1c No results for input(s): HGBA1C in the last 72 hours. Lipid Profile No results for input(s): CHOL, HDL, LDLCALC, TRIG, CHOLHDL, LDLDIRECT in the last 72 hours. Thyroid function studies No results for input(s): TSH, T4TOTAL, T3FREE, THYROIDAB in the last 72 hours.  Invalid input(s): FREET3 Anemia work up No results for input(s): VITAMINB12, FOLATE, FERRITIN, TIBC, IRON, RETICCTPCT in the last 72 hours.  Urinalysis    Component Value Date/Time   COLORURINE YELLOW 07/06/2018 1618   APPEARANCEUR CLEAR 07/06/2018 1618   APPEARANCEUR Hazy (A) 07/19/2016 1512   LABSPEC 1.006 07/06/2018 1618   PHURINE 8.0 07/06/2018 1618   GLUCOSEU NEGATIVE 07/06/2018 1618   HGBUR NEGATIVE 07/06/2018 1618   BILIRUBINUR NEGATIVE 07/06/2018 1618   BILIRUBINUR Negative 07/19/2016 1512   KETONESUR 20 (A) 07/06/2018 1618   PROTEINUR NEGATIVE 07/06/2018 1618   UROBILINOGEN negative 07/14/2013 1553   NITRITE NEGATIVE 07/06/2018 1618   LEUKOCYTESUR NEGATIVE 07/06/2018 1618   LEUKOCYTESUR 2+ (A) 07/19/2016 1512   Sepsis [facility] Invalid input(s): PROCALCITONIN,  WBC,  LACTICIDVEN Microbiology Recent Results (from the past 240 hour(s))  Culture, respiratory (non-expectorated)     Status: None   Collection Time: 07/10/18 11:35 AM  Result Value Ref Range Status   Specimen Description   Final    LUNG LEFT LOWER Performed at [place] [address] [place], [place] [address]    Special Requests   Final    NONE Performed at [facility], [place] [address], [place], [place] [address]    Gram Stain   Final    RARE WBC PRESENT, PREDOMINANTLY PMN NO ORGANISMS SEEN Performed at [facility], [place] [address], [place], [place] [address]    Culture   Final    FEW HAEMOPHILUS INFLUENZAE BETA LACTAMASE NEGATIVE    Report Status 07/12/2018 FINAL  Final  Fungus Culture With Stain     Status: None (Preliminary result)   Collection Time: 07/10/18 11:35 AM  Result Value Ref Range Status   Fungus Stain Final report  Final    Comment: (NOTE) Performed At: [facility] [place], [place] [id number] Rush Farmer MD [id number]    Fungus (Mycology) Culture PENDING  Incomplete   Fungal Source LEFT  Final    Comment: LOWER LUNG Performed at [facility], [place] [address] [place], [place] [address]   Fungus Culture Result     Status: None   Collection Time: 07/10/18 11:35 AM  Result Value Ref Range Status   Result 1 Comment  Final    Comment: (NOTE) KOH/Calcofluor preparation:  no fungus observed. Performed At: [facility] [address] [place], [place] [id number] Rush Farmer MD [id number]   Culture, blood (single)     Status: None (Preliminary result)   Collection Time: 07/17/18  4:39 PM  Result Value Ref Range Status   Specimen Description   Final    BLOOD LEFT FOREARM Performed at [facility], [place] [address], [place], [place] [address]    Special Requests   Final    BOTTLES DRAWN AEROBIC AND ANAEROBIC Blood Culture adequate volume Performed at [place] [address] [place], [place] [address]    Culture   Final    NO GROWTH < 12 HOURS Performed at [place] [address], [place], [place] [address]    Report Status PENDING  Incomplete  MRSA PCR Screening      Status: None   Collection Time: 07/17/18  6:23 PM  Result Value Ref Range Status   MRSA by PCR NEGATIVE NEGATIVE Final    Comment:        The GeneXpert MRSA Assay (FDA approved for NASAL specimens only), is one component of a comprehensive MRSA colonization surveillance program. It is not intended to diagnose MRSA infection nor to guide or monitor treatment for MRSA infections.  Performed at [facility], [place] [address] [place], [place] [address]   Culture, blood (routine x 2) Call MD if unable to obtain prior to antibiotics being given     Status: None (Preliminary result)   Collection Time: 07/17/18  7:42 PM  Result Value Ref Range Status   Specimen Description   Final    BLOOD RIGHT ANTECUBITAL Performed at [place] [address] [place], [place] [address]    Special Requests   Final    BOTTLES DRAWN AEROBIC AND ANAEROBIC Blood Culture adequate volume Performed at [place] [address] [place], [place] [address]    Culture   Final    NO GROWTH < 12 HOURS Performed at [place] [address] [place], [place] [address]    Report Status PENDING  Incomplete     Time coordinating discharge: 35 minutes  SIGNED:   Aline August, MD  [facility] 07/19/2018, 10:35 AM Pager: [phone]  If 7PM-7AM, please contact night-coverage [email or web address] Password [email or web address]

## 2018-07-19 NOTE — Progress Notes (Addendum)
SATURATION QUALIFICATIONS: (This note is used to comply with regulatory documentation for home oxygen)  Patient Saturations on Room Air at Rest = 91%  Patient Saturations on Room Air while Ambulating = 87%  Patient Saturations on 2 Liters Oxygen while ambulating = 93%  Please briefly explain why patient needs home oxygen: Patients desaturates to 87% while ambulation and gets weak/short of breath.

## 2018-07-19 NOTE — Progress Notes (Signed)
PAC order placed.

## 2018-07-20 DIAGNOSIS — I10 Essential (primary) hypertension: Secondary | ICD-10-CM | POA: Diagnosis not present

## 2018-07-20 DIAGNOSIS — E559 Vitamin D deficiency, unspecified: Secondary | ICD-10-CM | POA: Diagnosis not present

## 2018-07-20 DIAGNOSIS — E440 Moderate protein-calorie malnutrition: Secondary | ICD-10-CM | POA: Diagnosis not present

## 2018-07-20 DIAGNOSIS — C3490 Malignant neoplasm of unspecified part of unspecified bronchus or lung: Secondary | ICD-10-CM | POA: Diagnosis not present

## 2018-07-20 DIAGNOSIS — J189 Pneumonia, unspecified organism: Secondary | ICD-10-CM | POA: Diagnosis not present

## 2018-07-20 DIAGNOSIS — J440 Chronic obstructive pulmonary disease with acute lower respiratory infection: Secondary | ICD-10-CM | POA: Diagnosis not present

## 2018-07-20 DIAGNOSIS — E785 Hyperlipidemia, unspecified: Secondary | ICD-10-CM | POA: Diagnosis not present

## 2018-07-20 DIAGNOSIS — K589 Irritable bowel syndrome without diarrhea: Secondary | ICD-10-CM | POA: Diagnosis not present

## 2018-07-20 DIAGNOSIS — K219 Gastro-esophageal reflux disease without esophagitis: Secondary | ICD-10-CM | POA: Diagnosis not present

## 2018-07-20 DIAGNOSIS — G4730 Sleep apnea, unspecified: Secondary | ICD-10-CM | POA: Diagnosis not present

## 2018-07-20 DIAGNOSIS — C787 Secondary malignant neoplasm of liver and intrahepatic bile duct: Secondary | ICD-10-CM | POA: Diagnosis not present

## 2018-07-20 DIAGNOSIS — J8410 Pulmonary fibrosis, unspecified: Secondary | ICD-10-CM | POA: Diagnosis not present

## 2018-07-20 DIAGNOSIS — Z7951 Long term (current) use of inhaled steroids: Secondary | ICD-10-CM | POA: Diagnosis not present

## 2018-07-20 LAB — HEPATITIS PANEL, ACUTE
HCV Ab: <0.1 s/co ratio (ref 0.0–0.9)
HEP B S AG: NEGATIVE
Hep A IgM: NEGATIVE
Hep B C IgM: NEGATIVE

## 2018-07-22 LAB — CULTURE, BLOOD (SINGLE)
Culture: NO GROWTH
Special Requests: ADEQUATE

## 2018-07-22 LAB — CULTURE, BLOOD (ROUTINE X 2)
CULTURE: NO GROWTH
SPECIAL REQUESTS: ADEQUATE

## 2018-07-23 ENCOUNTER — Telehealth: Payer: Self-pay | Admitting: Medical Oncology

## 2018-07-23 DIAGNOSIS — J189 Pneumonia, unspecified organism: Secondary | ICD-10-CM | POA: Diagnosis not present

## 2018-07-23 DIAGNOSIS — I10 Essential (primary) hypertension: Secondary | ICD-10-CM | POA: Diagnosis not present

## 2018-07-23 DIAGNOSIS — G4730 Sleep apnea, unspecified: Secondary | ICD-10-CM | POA: Diagnosis not present

## 2018-07-23 DIAGNOSIS — Z7951 Long term (current) use of inhaled steroids: Secondary | ICD-10-CM | POA: Diagnosis not present

## 2018-07-23 DIAGNOSIS — E785 Hyperlipidemia, unspecified: Secondary | ICD-10-CM | POA: Diagnosis not present

## 2018-07-23 DIAGNOSIS — J440 Chronic obstructive pulmonary disease with acute lower respiratory infection: Secondary | ICD-10-CM | POA: Diagnosis not present

## 2018-07-23 DIAGNOSIS — E559 Vitamin D deficiency, unspecified: Secondary | ICD-10-CM | POA: Diagnosis not present

## 2018-07-23 DIAGNOSIS — K589 Irritable bowel syndrome without diarrhea: Secondary | ICD-10-CM | POA: Diagnosis not present

## 2018-07-23 DIAGNOSIS — C787 Secondary malignant neoplasm of liver and intrahepatic bile duct: Secondary | ICD-10-CM | POA: Diagnosis not present

## 2018-07-23 DIAGNOSIS — J8410 Pulmonary fibrosis, unspecified: Secondary | ICD-10-CM | POA: Diagnosis not present

## 2018-07-23 DIAGNOSIS — C3490 Malignant neoplasm of unspecified part of unspecified bronchus or lung: Secondary | ICD-10-CM | POA: Diagnosis not present

## 2018-07-23 DIAGNOSIS — E440 Moderate protein-calorie malnutrition: Secondary | ICD-10-CM | POA: Diagnosis not present

## 2018-07-23 DIAGNOSIS — K219 Gastro-esophageal reflux disease without esophagitis: Secondary | ICD-10-CM | POA: Diagnosis not present

## 2018-07-23 NOTE — Telephone Encounter (Signed)
Appts confirmed for this week and reviewed instructions for PET .

## 2018-07-24 ENCOUNTER — Inpatient Hospital Stay: Payer: Medicare Other | Attending: Internal Medicine

## 2018-07-24 ENCOUNTER — Inpatient Hospital Stay: Payer: Medicare Other

## 2018-07-24 ENCOUNTER — Inpatient Hospital Stay (HOSPITAL_BASED_OUTPATIENT_CLINIC_OR_DEPARTMENT_OTHER): Payer: Medicare Other | Admitting: Medical

## 2018-07-24 ENCOUNTER — Other Ambulatory Visit: Payer: Self-pay | Admitting: Medical

## 2018-07-24 VITALS — BP 103/64 | HR 99 | Temp 98.5°F | Resp 18 | Wt 172.5 lb

## 2018-07-24 DIAGNOSIS — K589 Irritable bowel syndrome without diarrhea: Secondary | ICD-10-CM | POA: Diagnosis not present

## 2018-07-24 DIAGNOSIS — E559 Vitamin D deficiency, unspecified: Secondary | ICD-10-CM | POA: Diagnosis not present

## 2018-07-24 DIAGNOSIS — Z79899 Other long term (current) drug therapy: Secondary | ICD-10-CM | POA: Diagnosis not present

## 2018-07-24 DIAGNOSIS — R590 Localized enlarged lymph nodes: Secondary | ICD-10-CM | POA: Insufficient documentation

## 2018-07-24 DIAGNOSIS — Z8541 Personal history of malignant neoplasm of cervix uteri: Secondary | ICD-10-CM | POA: Diagnosis not present

## 2018-07-24 DIAGNOSIS — R7989 Other specified abnormal findings of blood chemistry: Secondary | ICD-10-CM

## 2018-07-24 DIAGNOSIS — C3492 Malignant neoplasm of unspecified part of left bronchus or lung: Secondary | ICD-10-CM

## 2018-07-24 DIAGNOSIS — Z7689 Persons encountering health services in other specified circumstances: Secondary | ICD-10-CM | POA: Insufficient documentation

## 2018-07-24 DIAGNOSIS — Z5111 Encounter for antineoplastic chemotherapy: Secondary | ICD-10-CM | POA: Insufficient documentation

## 2018-07-24 DIAGNOSIS — C3411 Malignant neoplasm of upper lobe, right bronchus or lung: Secondary | ICD-10-CM

## 2018-07-24 DIAGNOSIS — K219 Gastro-esophageal reflux disease without esophagitis: Secondary | ICD-10-CM | POA: Insufficient documentation

## 2018-07-24 DIAGNOSIS — C787 Secondary malignant neoplasm of liver and intrahepatic bile duct: Secondary | ICD-10-CM

## 2018-07-24 DIAGNOSIS — R945 Abnormal results of liver function studies: Secondary | ICD-10-CM

## 2018-07-24 DIAGNOSIS — E785 Hyperlipidemia, unspecified: Secondary | ICD-10-CM | POA: Diagnosis not present

## 2018-07-24 DIAGNOSIS — J189 Pneumonia, unspecified organism: Secondary | ICD-10-CM

## 2018-07-24 DIAGNOSIS — R600 Localized edema: Secondary | ICD-10-CM | POA: Diagnosis not present

## 2018-07-24 DIAGNOSIS — C3412 Malignant neoplasm of upper lobe, left bronchus or lung: Secondary | ICD-10-CM | POA: Diagnosis not present

## 2018-07-24 DIAGNOSIS — J449 Chronic obstructive pulmonary disease, unspecified: Secondary | ICD-10-CM | POA: Diagnosis not present

## 2018-07-24 DIAGNOSIS — Z87891 Personal history of nicotine dependence: Secondary | ICD-10-CM

## 2018-07-24 DIAGNOSIS — C7951 Secondary malignant neoplasm of bone: Secondary | ICD-10-CM | POA: Insufficient documentation

## 2018-07-24 DIAGNOSIS — M797 Fibromyalgia: Secondary | ICD-10-CM | POA: Diagnosis not present

## 2018-07-24 DIAGNOSIS — R5382 Chronic fatigue, unspecified: Secondary | ICD-10-CM

## 2018-07-24 LAB — CMP (CANCER CENTER ONLY)
ALBUMIN: 1.9 g/dL — AB (ref 3.5–5.0)
ALK PHOS: 671 U/L — AB (ref 38–126)
ALT: 100 U/L — ABNORMAL HIGH (ref 0–44)
ANION GAP: 9 (ref 5–15)
AST: 284 U/L (ref 15–41)
BUN: 6 mg/dL (ref 6–20)
CALCIUM: 9 mg/dL (ref 8.9–10.3)
CO2: 28 mmol/L (ref 22–32)
Chloride: 95 mmol/L — ABNORMAL LOW (ref 98–111)
Creatinine: 0.57 mg/dL (ref 0.44–1.00)
GFR, Est AFR Am: >60 mL/min (ref >60)
GFR, Estimated: >60 mL/min (ref >60)
GLUCOSE: 108 mg/dL — AB (ref 70–99)
POTASSIUM: 3.9 mmol/L (ref 3.5–5.1)
SODIUM: 132 mmol/L — AB (ref 135–145)
Total Bilirubin: 0.6 mg/dL (ref 0.3–1.2)
Total Protein: 6.7 g/dL (ref 6.5–8.1)

## 2018-07-24 LAB — CBC WITH DIFFERENTIAL (CANCER CENTER ONLY)
BASOS PCT: 0 %
Basophils Absolute: 0 10*3/uL (ref 0.0–0.1)
EOS ABS: 0 10*3/uL (ref 0.0–0.5)
Eosinophils Relative: 0 %
HCT: 38.1 % (ref 34.8–46.6)
HEMOGLOBIN: 12.6 g/dL (ref 11.6–15.9)
Lymphocytes Relative: 14 %
Lymphs Abs: 1.7 10*3/uL (ref 0.9–3.3)
MCH: 28.5 pg (ref 25.1–34.0)
MCHC: 33.1 g/dL (ref 31.5–36.0)
MCV: 86.2 fL (ref 79.5–101.0)
MONOS PCT: 8 %
Monocytes Absolute: 0.9 10*3/uL (ref 0.1–0.9)
NEUTROS ABS: 9.4 10*3/uL — AB (ref 1.5–6.5)
NEUTROS PCT: 78 %
Platelet Count: 429 10*3/uL — ABNORMAL HIGH (ref 145–400)
RBC: 4.42 MIL/uL (ref 3.70–5.45)
RDW: 17.3 % — ABNORMAL HIGH (ref 11.2–14.5)
WBC Count: 12.1 10*3/uL — ABNORMAL HIGH (ref 3.9–10.3)

## 2018-07-24 LAB — TSH: TSH: 2.788 u[IU]/mL (ref 0.308–3.960)

## 2018-07-24 MED ORDER — ALLOPURINOL 300 MG PO TABS: 300.0000 mg | ORAL_TABLET | Freq: Every day | ORAL | 0 refills | Status: AC

## 2018-07-24 MED ORDER — PALONOSETRON HCL INJECTION 0.25 MG/5ML
INTRAVENOUS | Status: AC
  Filled 2018-07-24: qty 5

## 2018-07-24 MED ORDER — SODIUM CHLORIDE 0.9 % IV SOLN
571.5000 mg | Freq: Once | INTRAVENOUS | Status: AC
  Administered 2018-07-24: 570 mg via INTRAVENOUS
  Filled 2018-07-24: qty 57

## 2018-07-24 MED ORDER — SODIUM CHLORIDE 0.9 % IV SOLN
50.0000 mg/m2 | Freq: Once | INTRAVENOUS | Status: AC
  Administered 2018-07-24: 90 mg via INTRAVENOUS
  Filled 2018-07-24: qty 4.5

## 2018-07-24 MED ORDER — SODIUM CHLORIDE 0.9 % IV SOLN
Freq: Once | INTRAVENOUS | Status: AC
  Administered 2018-07-24: 10:00:00 via INTRAVENOUS
  Filled 2018-07-24: qty 5

## 2018-07-24 MED ORDER — PALONOSETRON HCL INJECTION 0.25 MG/5ML
0.2500 mg | Freq: Once | INTRAVENOUS | Status: AC
  Administered 2018-07-24: 0.25 mg via INTRAVENOUS

## 2018-07-24 MED ORDER — ALLOPURINOL 300 MG PO TABS
300.0000 mg | ORAL_TABLET | Freq: Once | ORAL | Status: AC
  Administered 2018-07-24: 300 mg via ORAL
  Filled 2018-07-24: qty 1

## 2018-07-24 MED ORDER — SODIUM CHLORIDE 0.9 % IV SOLN
Freq: Once | INTRAVENOUS | Status: AC
  Administered 2018-07-24: 10:00:00 via INTRAVENOUS
  Filled 2018-07-24: qty 250

## 2018-07-24 MED ORDER — SODIUM CHLORIDE 0.9 % IV SOLN
Freq: Once | INTRAVENOUS | Status: DC
  Filled 2018-07-24: qty 250

## 2018-07-24 MED ORDER — SODIUM CHLORIDE 0.9 % IV SOLN
1200.0000 mg | Freq: Once | INTRAVENOUS | Status: AC
  Administered 2018-07-24: 1200 mg via INTRAVENOUS
  Filled 2018-07-24: qty 20

## 2018-07-24 MED ORDER — ALLOPURINOL 300 MG PO TABS
300.0000 mg | ORAL_TABLET | Freq: Once | ORAL | Status: DC
  Filled 2018-07-24: qty 1

## 2018-07-24 NOTE — Progress Notes (Signed)
Per Van/Dr. Benay Spice, etoposide is to be dose reduced to 50 mg/m2 (50%) due to elevated LFTs. Orders updated.   Demetrius Charity, PharmD Oncology Pharmacist Pharmacy Phone: [phone] 07/24/2018

## 2018-07-24 NOTE — Patient Instructions (Addendum)
[facility] Discharge Instructions for Patients Receiving Chemotherapy  Today you received the following chemotherapy agents Tecentriq, Carboplatin and Etoposide  To help prevent nausea and vomiting after your treatment, we encourage you to take your nausea medication as prescribed.   If you develop nausea and vomiting that is not controlled by your nausea medication, call the clinic.   BELOW ARE SYMPTOMS THAT SHOULD BE REPORTED IMMEDIATELY:  *FEVER GREATER THAN 100.5 F  *CHILLS WITH OR WITHOUT FEVER  NAUSEA AND VOMITING THAT IS NOT CONTROLLED WITH YOUR NAUSEA MEDICATION  *UNUSUAL SHORTNESS OF BREATH  *UNUSUAL BRUISING OR BLEEDING  TENDERNESS IN MOUTH AND THROAT WITH OR WITHOUT PRESENCE OF ULCERS  *URINARY PROBLEMS  *BOWEL PROBLEMS  UNUSUAL RASH Items with * indicate a potential emergency and should be followed up as soon as possible.  Feel free to call the clinic should you have any questions or concerns. The clinic phone number is [phone].  Please show the [facility] at check-in to the Emergency Department and triage nurse.  Atezolizumab injection(Tecentriq) What is this medicine? ATEZOLIZUMAB (a te zoe LIZ ue mab) is a monoclonal antibody. It is used to treat bladder cancer (urothelial cancer) and non-small cell lung cancer. This medicine may be used for other purposes; ask your health care provider or pharmacist if you have questions. COMMON BRAND NAME(S): Tecentriq What should I tell my health care provider before I take this medicine? They need to know if you have any of these conditions: -diabetes -immune system problems -infection -inflammatory bowel disease -liver disease -lung or breathing disease -lupus -nervous system problems like myasthenia gravis or Guillain-Barre syndrome -organ transplant -an unusual or allergic reaction to atezolizumab, other medicines, foods, dyes, or preservatives -pregnant or trying to get  pregnant -breast-feeding How should I use this medicine? This medicine is for infusion into a vein. It is given by a health care professional in a hospital or clinic setting. A special MedGuide will be given to you before each treatment. Be sure to read this information carefully each time. Talk to your pediatrician regarding the use of this medicine in children. Special care may be needed. Overdosage: If you think you have taken too much of this medicine contact a poison control center or emergency room at once. NOTE: This medicine is only for you. Do not share this medicine with others. What if I miss a dose? It is important not to miss your dose. Call your doctor or health care professional if you are unable to keep an appointment. What may interact with this medicine? Interactions have not been studied. This list may not describe all possible interactions. Give your health care provider a list of all the medicines, herbs, non-prescription drugs, or dietary supplements you use. Also tell them if you smoke, drink alcohol, or use illegal drugs. Some items may interact with your medicine. What should I watch for while using this medicine? Your condition will be monitored carefully while you are receiving this medicine. You may need blood work done while you are taking this medicine. Do not become pregnant while taking this medicine or for at least 5 months after stopping it. Women should inform their doctor if they wish to become pregnant or think they might be pregnant. There is a potential for serious side effects to an unborn child. Talk to your health care professional or pharmacist for more information. Do not breast-feed an infant while taking this medicine or for at least 5 months after the last  dose. What side effects may I notice from receiving this medicine? Side effects that you should report to your doctor or health care professional as soon as possible: -allergic reactions like skin  rash, itching or hives, swelling of the face, lips, or tongue -black, tarry stools -bloody or watery diarrhea -breathing problems -changes in vision -chest pain or chest tightness -chills -facial flushing -fever -headache -signs and symptoms of high blood sugar such as dizziness; dry mouth; dry skin; fruity breath; nausea; stomach pain; increased hunger or thirst; increased urination -signs and symptoms of liver injury like dark yellow or brown urine; general ill feeling or flu-like symptoms; light-colored stools; loss of appetite; nausea; right upper belly pain; unusually weak or tired; yellowing of the eyes or skin -stomach pain -trouble passing urine or change in the amount of urine Side effects that usually do not require medical attention (report to your doctor or health care professional if they continue or are bothersome): -cough -diarrhea -joint pain -muscle pain -muscle weakness -tiredness -weight loss This list may not describe all possible side effects. Call your doctor for medical advice about side effects. You may report side effects to FDA at 1-800-FDA-1088. Where should I keep my medicine? This drug is given in a hospital or clinic and will not be stored at home. NOTE: This sheet is a summary. It may not cover all possible information. If you have questions about this medicine, talk to your doctor, pharmacist, or health care provider.  2018 Elsevier/Gold Standard (2015-12-08 17:54:14)  Carboplatin injection What is this medicine? CARBOPLATIN (KAR boe pla tin) is a chemotherapy drug. It targets fast dividing cells, like cancer cells, and causes these cells to die. This medicine is used to treat ovarian cancer and many other cancers. This medicine may be used for other purposes; ask your health care provider or pharmacist if you have questions. COMMON BRAND NAME(S): Paraplatin What should I tell my health care provider before I take this medicine? They need to know if  you have any of these conditions: -blood disorders -hearing problems -kidney disease -recent or ongoing radiation therapy -an unusual or allergic reaction to carboplatin, cisplatin, other chemotherapy, other medicines, foods, dyes, or preservatives -pregnant or trying to get pregnant -breast-feeding How should I use this medicine? This drug is usually given as an infusion into a vein. It is administered in a hospital or clinic by a specially trained health care professional. Talk to your pediatrician regarding the use of this medicine in children. Special care may be needed. Overdosage: If you think you have taken too much of this medicine contact a poison control center or emergency room at once. NOTE: This medicine is only for you. Do not share this medicine with others. What if I miss a dose? It is important not to miss a dose. Call your doctor or health care professional if you are unable to keep an appointment. What may interact with this medicine? -medicines for seizures -medicines to increase blood counts like filgrastim, pegfilgrastim, sargramostim -some antibiotics like amikacin, gentamicin, neomycin, streptomycin, tobramycin -vaccines Talk to your doctor or health care professional before taking any of these medicines: -acetaminophen -aspirin -ibuprofen -ketoprofen -naproxen This list may not describe all possible interactions. Give your health care provider a list of all the medicines, herbs, non-prescription drugs, or dietary supplements you use. Also tell them if you smoke, drink alcohol, or use illegal drugs. Some items may interact with your medicine. What should I watch for while using this medicine? Your condition  will be monitored carefully while you are receiving this medicine. You will need important blood work done while you are taking this medicine. This drug may make you feel generally unwell. This is not uncommon, as chemotherapy can affect healthy cells as well  as cancer cells. Report any side effects. Continue your course of treatment even though you feel ill unless your doctor tells you to stop. In some cases, you may be given additional medicines to help with side effects. Follow all directions for their use. Call your doctor or health care professional for advice if you get a fever, chills or sore throat, or other symptoms of a cold or flu. Do not treat yourself. This drug decreases your body's ability to fight infections. Try to avoid being around people who are sick. This medicine may increase your risk to bruise or bleed. Call your doctor or health care professional if you notice any unusual bleeding. Be careful brushing and flossing your teeth or using a toothpick because you may get an infection or bleed more easily. If you have any dental work done, tell your dentist you are receiving this medicine. Avoid taking products that contain aspirin, acetaminophen, ibuprofen, naproxen, or ketoprofen unless instructed by your doctor. These medicines may hide a fever. Do not become pregnant while taking this medicine. Women should inform their doctor if they wish to become pregnant or think they might be pregnant. There is a potential for serious side effects to an unborn child. Talk to your health care professional or pharmacist for more information. Do not breast-feed an infant while taking this medicine. What side effects may I notice from receiving this medicine? Side effects that you should report to your doctor or health care professional as soon as possible: -allergic reactions like skin rash, itching or hives, swelling of the face, lips, or tongue -signs of infection - fever or chills, cough, sore throat, pain or difficulty passing urine -signs of decreased platelets or bleeding - bruising, pinpoint red spots on the skin, black, tarry stools, nosebleeds -signs of decreased red blood cells - unusually weak or tired, fainting spells,  lightheadedness -breathing problems -changes in hearing -changes in vision -chest pain -high blood pressure -low blood counts - This drug may decrease the number of white blood cells, red blood cells and platelets. You may be at increased risk for infections and bleeding. -nausea and vomiting -pain, swelling, redness or irritation at the injection site -pain, tingling, numbness in the hands or feet -problems with balance, talking, walking -trouble passing urine or change in the amount of urine Side effects that usually do not require medical attention (report to your doctor or health care professional if they continue or are bothersome): -hair loss -loss of appetite -metallic taste in the mouth or changes in taste This list may not describe all possible side effects. Call your doctor for medical advice about side effects. You may report side effects to FDA at 1-800-FDA-1088. Where should I keep my medicine? This drug is given in a hospital or clinic and will not be stored at home. NOTE: This sheet is a summary. It may not cover all possible information. If you have questions about this medicine, talk to your doctor, pharmacist, or health care provider.  2018 Elsevier/Gold Standard (2008-02-11 14:38:05)   Etoposide, VP-16 capsules What is this medicine? ETOPOSIDE, VP-16 (e toe POE side) is a chemotherapy drug. It is used to treat small cell lung cancer and other cancers. This medicine may be used for other  purposes; ask your health care provider or pharmacist if you have questions. COMMON BRAND NAME(S): VePesid What should I tell my health care provider before I take this medicine? They need to know if you have any of these conditions: -infection -kidney disease -liver disease -low blood counts, like low white cell, platelet, or red cell counts -an unusual or allergic reaction to etoposide, other medicines, foods, dyes, or preservatives -pregnant or trying to get  pregnant -breast-feeding How should I use this medicine? Take this medicine by mouth with a glass of water. Follow the directions on the prescription label. Do not open, crush, or chew the capsules. It is advisable to wear gloves when handling this medicine. Take your medicine at regular intervals. Do not take it more often than directed. Do not stop taking except on your doctor's advice. Talk to your pediatrician regarding the use of this medicine in children. Special care may be needed. Overdosage: If you think you have taken too much of this medicine contact a poison control center or emergency room at once. NOTE: This medicine is only for you. Do not share this medicine with others. What if I miss a dose? If you miss a dose, take it as soon as you can. If it is almost time for your next dose, take only that dose. Do not take double or extra doses. What may interact with this medicine? -aspirin -certain medications for seizures like carbamazepine, phenobarbital, phenytoin, valproic acid -cyclosporine -levamisole -valproic acid -warfarin This list may not describe all possible interactions. Give your health care provider a list of all the medicines, herbs, non-prescription drugs, or dietary supplements you use. Also tell them if you smoke, drink alcohol, or use illegal drugs. Some items may interact with your medicine. What should I watch for while using this medicine? Visit your doctor for checks on your progress. This drug may make you feel generally unwell. This is not uncommon, as chemotherapy can affect healthy cells as well as cancer cells. Report any side effects. Continue your course of treatment even though you feel ill unless your doctor tells you to stop. In some cases, you may be given additional medicines to help with side effects. Follow all directions for their use. Call your doctor or health care professional for advice if you get a fever, chills or sore throat, or other  symptoms of a cold or flu. Do not treat yourself. This drug decreases your body's ability to fight infections. Try to avoid being around people who are sick. This medicine may increase your risk to bruise or bleed. Call your doctor or health care professional if you notice any unusual bleeding. Talk to your doctor about your risk of cancer. You may be more at risk for certain types of cancers if you take this medicine. Do not become pregnant while taking this medicine or for at least 6 months after stopping it. Women should inform their doctor if they wish to become pregnant or think they might be pregnant. Women of child-bearing potential will need to have a negative pregnancy test before starting this medicine. There is a potential for serious side effects to an unborn child. Talk to your health care professional or pharmacist for more information. Do not breast-feed an infant while taking this medicine. Men must use a latex condom during sexual contact with a woman while taking this medicine and for at least 4 months after stopping it. A latex condom is needed even if you have had a vasectomy. Contact your  doctor right away if your partner becomes pregnant. Do not donate sperm while taking this medicine and for 4 months after you stop taking this medicine. Men should inform their doctors if they wish to father a child. This medicine may lower sperm counts. What side effects may I notice from receiving this medicine? Side effects that you should report to your doctor or health care professional as soon as possible: -allergic reactions like skin rash, itching or hives, swelling of the face, lips, or tongue -low blood counts - this medicine may decrease the number of white blood cells, red blood cells and platelets. You may be at increased risk for infections and bleeding. -signs of infection - fever or chills, cough, sore throat, pain or difficulty passing urine -signs of decreased platelets or bleeding -  bruising, pinpoint red spots on the skin, black, tarry stools, blood in the urine -signs of decreased red blood cells - unusually weak or tired, fainting spells, lightheadedness -breathing problems -changes in vision -mouth or throat sores or ulcers -pain, tingling, numbness in the hands or feet -redness, blistering, peeling or loosening of the skin, including inside the mouth -seizures -vomiting Side effects that usually do not require medical attention (report to your doctor or health care professional if they continue or are bothersome): -change in taste -diarrhea -hair loss -nausea -stomach pain This list may not describe all possible side effects. Call your doctor for medical advice about side effects. You may report side effects to FDA at 1-800-FDA-1088. Where should I keep my medicine? Keep out of the reach of children. Store in a refrigerator between 2 and 8 degrees C (36 and 46 degrees F). Do not freeze. Throw away any unused medicine after the expiration date. NOTE: This sheet is a summary. It may not cover all possible information. If you have questions about this medicine, talk to your doctor, pharmacist, or health care provider.  2018 Elsevier/Gold Standard (2015-10-29 11:49:52)  Allopurinol injection What is this medicine? ALLOPURINOL (al oh PURE i nole) reduces the amount of uric acid the body makes during chemotherapy. Too much uric acid in the blood can cause damage to your kidneys. This medicine may be used for other purposes; ask your health care provider or pharmacist if you have questions. COMMON BRAND NAME(S): Aloprim What should I tell my health care provider before I take this medicine? They need to know if you have any of these conditions: -kidney disease -liver disease -an unusual or allergic reaction to allopurinol, other medicines, foods, dyes, or preservatives -pregnant or trying to get pregnant -breast feeding How should I use this medicine? The  medicine is for infusion into a vein. It is given by a health care professional in a hospital or clinic setting. Talk to your pediatrician regarding the use of this medicine in children. Special care may be needed. Overdosage: If you think you have taken too much of this medicine contact a poison control center or emergency room at once. NOTE: This medicine is only for you. Do not share this medicine with others. What if I miss a dose? This does not apply. What may interact with this medicine? Do not take this medicine with the following medication: -didanosine, ddI This medicine may also interact with the following medications: -amoxicillin or ampicillin -azathioprine -certain medicines used to treat gout -chlorpropamide -cyclosporine -diuretics -mercaptopurine -probenecid -sulfinpyrazone -warfarin This list may not describe all possible interactions. Give your health care provider a list of all the medicines, herbs, non-prescription drugs, or  dietary supplements you use. Also tell them if you smoke, drink alcohol, or use illegal drugs. Some items may interact with your medicine. What should I watch for while using this medicine? Your condition will be monitored carefully while you are receiving this medicine. You may get drowsy or dizzy. Do not drive, use machinery, or do anything that needs mental alertness until you know how this medicine affects you. Do not stand or sit up quickly, especially if you are an older patient. This reduces the risk of dizzy or fainting spells. Drink plenty of water while you are taking this medicine. This will help to reduce the risk of getting gout or kidney stones. What side effects may I notice from receiving this medicine? Side effects that you should report to your doctor or health care professional as soon as possible: -allergic reactions like skin rash, itching or hives, swelling of the face, lips, or tongue -difficulty passing urine -loss of  appetite -redness, blistering, peeling or loosening of the skin, including inside the mouth -unusual bleeding or bruising -unusually weak or tired -vomiting -yellowing of the skin or whites of the eyes Side effects that usually do not require medical attention (report to your doctor or health care professional if they continue or are bothersome): -diarrhea -drowsiness -nausea -stomach pain This list may not describe all possible side effects. Call your doctor for medical advice about side effects. You may report side effects to FDA at 1-800-FDA-1088. Where should I keep my medicine? This drug is given in a hospital or clinic and will not be stored at home. NOTE: This sheet is a summary. It may not cover all possible information. If you have questions about this medicine, talk to your doctor, pharmacist, or health care provider.  2018 Elsevier/Gold Standard (2008-05-12 10:32:21)

## 2018-07-24 NOTE — Progress Notes (Signed)
Pt states "swelling in left ankle" started yesterday and has gotten worse. On assessment bilateral ankles edema noted. No redness and pt states no pain associated. Sandi Mealy to tx area to assess.    Ok to tx today Per Sandi Mealy, IVF to be given today and  allopurinol to be given in tx area today.

## 2018-07-25 ENCOUNTER — Telehealth: Payer: Self-pay | Admitting: Medical Oncology

## 2018-07-25 ENCOUNTER — Ambulatory Visit (HOSPITAL_COMMUNITY)
Admission: RE | Admit: 2018-07-25 | Discharge: 2018-07-25 | Disposition: A | Discharge status: Home / Self Care | Payer: Medicare Other | Source: Ambulatory Visit | Attending: Internal Medicine | Admitting: Internal Medicine

## 2018-07-25 ENCOUNTER — Inpatient Hospital Stay: Payer: Medicare Other

## 2018-07-25 VITALS — BP 111/58 | HR 98 | Temp 98.1°F | Resp 18

## 2018-07-25 DIAGNOSIS — J449 Chronic obstructive pulmonary disease, unspecified: Secondary | ICD-10-CM | POA: Diagnosis not present

## 2018-07-25 DIAGNOSIS — I700 Atherosclerosis of aorta: Secondary | ICD-10-CM | POA: Diagnosis not present

## 2018-07-25 DIAGNOSIS — C7951 Secondary malignant neoplasm of bone: Secondary | ICD-10-CM | POA: Diagnosis not present

## 2018-07-25 DIAGNOSIS — M797 Fibromyalgia: Secondary | ICD-10-CM | POA: Diagnosis not present

## 2018-07-25 DIAGNOSIS — K589 Irritable bowel syndrome without diarrhea: Secondary | ICD-10-CM | POA: Diagnosis not present

## 2018-07-25 DIAGNOSIS — Z87891 Personal history of nicotine dependence: Secondary | ICD-10-CM | POA: Diagnosis not present

## 2018-07-25 DIAGNOSIS — R590 Localized enlarged lymph nodes: Secondary | ICD-10-CM | POA: Insufficient documentation

## 2018-07-25 DIAGNOSIS — Z7689 Persons encountering health services in other specified circumstances: Secondary | ICD-10-CM | POA: Diagnosis not present

## 2018-07-25 DIAGNOSIS — R918 Other nonspecific abnormal finding of lung field: Secondary | ICD-10-CM | POA: Insufficient documentation

## 2018-07-25 DIAGNOSIS — R932 Abnormal findings on diagnostic imaging of liver and biliary tract: Secondary | ICD-10-CM | POA: Insufficient documentation

## 2018-07-25 DIAGNOSIS — C3492 Malignant neoplasm of unspecified part of left bronchus or lung: Secondary | ICD-10-CM | POA: Diagnosis not present

## 2018-07-25 DIAGNOSIS — Z79899 Other long term (current) drug therapy: Secondary | ICD-10-CM | POA: Diagnosis not present

## 2018-07-25 DIAGNOSIS — K219 Gastro-esophageal reflux disease without esophagitis: Secondary | ICD-10-CM | POA: Diagnosis not present

## 2018-07-25 DIAGNOSIS — E785 Hyperlipidemia, unspecified: Secondary | ICD-10-CM | POA: Diagnosis not present

## 2018-07-25 DIAGNOSIS — C3412 Malignant neoplasm of upper lobe, left bronchus or lung: Secondary | ICD-10-CM | POA: Diagnosis not present

## 2018-07-25 DIAGNOSIS — C787 Secondary malignant neoplasm of liver and intrahepatic bile duct: Secondary | ICD-10-CM | POA: Diagnosis not present

## 2018-07-25 DIAGNOSIS — R600 Localized edema: Secondary | ICD-10-CM | POA: Diagnosis not present

## 2018-07-25 DIAGNOSIS — Z5111 Encounter for antineoplastic chemotherapy: Secondary | ICD-10-CM | POA: Diagnosis not present

## 2018-07-25 DIAGNOSIS — E559 Vitamin D deficiency, unspecified: Secondary | ICD-10-CM | POA: Diagnosis not present

## 2018-07-25 DIAGNOSIS — C3411 Malignant neoplasm of upper lobe, right bronchus or lung: Secondary | ICD-10-CM | POA: Diagnosis not present

## 2018-07-25 DIAGNOSIS — C3490 Malignant neoplasm of unspecified part of unspecified bronchus or lung: Secondary | ICD-10-CM | POA: Diagnosis not present

## 2018-07-25 LAB — GLUCOSE, CAPILLARY: Glucose-Capillary: 96 mg/dL (ref 70–99)

## 2018-07-25 MED ORDER — SODIUM CHLORIDE 0.9 % IV SOLN
Freq: Once | INTRAVENOUS | Status: AC
  Administered 2018-07-25: 14:00:00 via INTRAVENOUS
  Filled 2018-07-25: qty 250

## 2018-07-25 MED ORDER — SODIUM CHLORIDE 0.9 % IV SOLN
50.0000 mg/m2 | Freq: Once | INTRAVENOUS | Status: AC
  Administered 2018-07-25: 90 mg via INTRAVENOUS
  Filled 2018-07-25: qty 4.5

## 2018-07-25 MED ORDER — DEXAMETHASONE SODIUM PHOSPHATE 10 MG/ML IJ SOLN
10.0000 mg | Freq: Once | INTRAMUSCULAR | Status: AC
  Administered 2018-07-25: 10 mg via INTRAVENOUS

## 2018-07-25 MED ORDER — DEXAMETHASONE SODIUM PHOSPHATE 10 MG/ML IJ SOLN
INTRAMUSCULAR | Status: AC
  Filled 2018-07-25: qty 1

## 2018-07-25 MED ORDER — FLUDEOXYGLUCOSE F - 18 (FDG) INJECTION
8.5800 | Freq: Once | INTRAVENOUS | Status: AC | PRN
  Administered 2018-07-25: 8.58 via INTRAVENOUS

## 2018-07-25 NOTE — Telephone Encounter (Signed)
LVM to return call if she is having any side effects or concerns.

## 2018-07-25 NOTE — Patient Instructions (Signed)
[facility] Discharge Instructions for Patients Receiving Chemotherapy  Today you received the following chemotherapy agent Etoposide  To help prevent nausea and vomiting after your treatment, we encourage you to take your nausea medication as prescribed.   If you develop nausea and vomiting that is not controlled by your nausea medication, call the clinic.   BELOW ARE SYMPTOMS THAT SHOULD BE REPORTED IMMEDIATELY:  *FEVER GREATER THAN 100.5 F  *CHILLS WITH OR WITHOUT FEVER  NAUSEA AND VOMITING THAT IS NOT CONTROLLED WITH YOUR NAUSEA MEDICATION  *UNUSUAL SHORTNESS OF BREATH  *UNUSUAL BRUISING OR BLEEDING  TENDERNESS IN MOUTH AND THROAT WITH OR WITHOUT PRESENCE OF ULCERS  *URINARY PROBLEMS  *BOWEL PROBLEMS  UNUSUAL RASH Items with * indicate a potential emergency and should be followed up as soon as possible.  Feel free to call the clinic should you have any questions or concerns. The clinic phone number is [phone].  Please show the [facility] at check-in to the Emergency Department and triage nurse.

## 2018-07-26 ENCOUNTER — Inpatient Hospital Stay: Payer: Medicare Other

## 2018-07-26 ENCOUNTER — Other Ambulatory Visit: Payer: Self-pay | Admitting: Medical

## 2018-07-26 ENCOUNTER — Inpatient Hospital Stay (HOSPITAL_BASED_OUTPATIENT_CLINIC_OR_DEPARTMENT_OTHER): Payer: Medicare Other | Admitting: Medical

## 2018-07-26 ENCOUNTER — Ambulatory Visit (HOSPITAL_COMMUNITY)
Admission: RE | Admit: 2018-07-26 | Discharge: 2018-07-26 | Disposition: A | Discharge status: Home / Self Care | Payer: Medicare Other | Source: Ambulatory Visit | Attending: Medical | Admitting: Medical

## 2018-07-26 VITALS — BP 131/64 | HR 104 | Temp 97.7°F | Resp 17

## 2018-07-26 DIAGNOSIS — Z5111 Encounter for antineoplastic chemotherapy: Secondary | ICD-10-CM | POA: Diagnosis not present

## 2018-07-26 DIAGNOSIS — R600 Localized edema: Secondary | ICD-10-CM

## 2018-07-26 DIAGNOSIS — E785 Hyperlipidemia, unspecified: Secondary | ICD-10-CM | POA: Diagnosis not present

## 2018-07-26 DIAGNOSIS — C3411 Malignant neoplasm of upper lobe, right bronchus or lung: Secondary | ICD-10-CM | POA: Diagnosis not present

## 2018-07-26 DIAGNOSIS — C3412 Malignant neoplasm of upper lobe, left bronchus or lung: Secondary | ICD-10-CM

## 2018-07-26 DIAGNOSIS — R05 Cough: Secondary | ICD-10-CM | POA: Insufficient documentation

## 2018-07-26 DIAGNOSIS — K589 Irritable bowel syndrome without diarrhea: Secondary | ICD-10-CM | POA: Diagnosis not present

## 2018-07-26 DIAGNOSIS — C3492 Malignant neoplasm of unspecified part of left bronchus or lung: Secondary | ICD-10-CM

## 2018-07-26 DIAGNOSIS — Z87891 Personal history of nicotine dependence: Secondary | ICD-10-CM

## 2018-07-26 DIAGNOSIS — R059 Cough, unspecified: Secondary | ICD-10-CM

## 2018-07-26 DIAGNOSIS — E559 Vitamin D deficiency, unspecified: Secondary | ICD-10-CM | POA: Diagnosis not present

## 2018-07-26 DIAGNOSIS — J189 Pneumonia, unspecified organism: Secondary | ICD-10-CM

## 2018-07-26 DIAGNOSIS — M797 Fibromyalgia: Secondary | ICD-10-CM | POA: Diagnosis not present

## 2018-07-26 DIAGNOSIS — K219 Gastro-esophageal reflux disease without esophagitis: Secondary | ICD-10-CM | POA: Diagnosis not present

## 2018-07-26 DIAGNOSIS — R590 Localized enlarged lymph nodes: Secondary | ICD-10-CM

## 2018-07-26 DIAGNOSIS — J90 Pleural effusion, not elsewhere classified: Secondary | ICD-10-CM | POA: Diagnosis not present

## 2018-07-26 DIAGNOSIS — Z79899 Other long term (current) drug therapy: Secondary | ICD-10-CM

## 2018-07-26 DIAGNOSIS — C7951 Secondary malignant neoplasm of bone: Secondary | ICD-10-CM | POA: Diagnosis not present

## 2018-07-26 DIAGNOSIS — Z7689 Persons encountering health services in other specified circumstances: Secondary | ICD-10-CM | POA: Diagnosis not present

## 2018-07-26 DIAGNOSIS — C787 Secondary malignant neoplasm of liver and intrahepatic bile duct: Secondary | ICD-10-CM | POA: Diagnosis not present

## 2018-07-26 DIAGNOSIS — C3490 Malignant neoplasm of unspecified part of unspecified bronchus or lung: Secondary | ICD-10-CM | POA: Diagnosis not present

## 2018-07-26 DIAGNOSIS — J449 Chronic obstructive pulmonary disease, unspecified: Secondary | ICD-10-CM | POA: Diagnosis not present

## 2018-07-26 MED ORDER — DEXAMETHASONE SODIUM PHOSPHATE 10 MG/ML IJ SOLN
10.0000 mg | Freq: Once | INTRAMUSCULAR | Status: AC
  Administered 2018-07-26: 10 mg via INTRAVENOUS

## 2018-07-26 MED ORDER — DOXYCYCLINE HYCLATE 100 MG PO TABS: 100.0000 mg | ORAL_TABLET | Freq: Two times a day (BID) | ORAL | 0 refills | Status: DC

## 2018-07-26 MED ORDER — SODIUM CHLORIDE 0.9 % IV SOLN
50.0000 mg/m2 | Freq: Once | INTRAVENOUS | Status: AC
  Administered 2018-07-26: 90 mg via INTRAVENOUS
  Filled 2018-07-26: qty 4.5

## 2018-07-26 MED ORDER — DEXAMETHASONE SODIUM PHOSPHATE 10 MG/ML IJ SOLN
INTRAMUSCULAR | Status: AC
  Filled 2018-07-26: qty 1

## 2018-07-26 MED ORDER — SODIUM CHLORIDE 0.9 % IV SOLN
Freq: Once | INTRAVENOUS | Status: AC
  Administered 2018-07-26: 14:00:00 via INTRAVENOUS
  Filled 2018-07-26: qty 250

## 2018-07-26 NOTE — Progress Notes (Signed)
[facility] Progress Note   [name] [id number] 01-12-1956 62 y.o.  [name] is managed by Dr. Fanny Rose. 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  The patient's elevated LFTs were reviewed with both Dr. Benay Rose and with pharmacy.  Recommendations were for a 50% dose reduction of etoposide.  Postobstructive pneumonia: [name] is status post a recent hospitalization for a postobstructive pneumonia.  She was hospitalized from 08/28 through 07/19/2018.  She was treated with cefepime was given a 7-day course of doxycycline which she completes today.  She has a follow-up appointment with Dr. Julien Rose on 07/30/2018.  Please see After Visit Summary for patient specific instructions.  Future Appointments  Date Time Provider [place]  07/30/2018  8:00 AM [facility] 6 [facility] None  07/30/2018  8:30 AM Jessica Bears, MD [facility] None  07/30/2018 10:00 AM [facility] FLUSH [facility] None  08/02/2018 10:00 AM [facility] RO[facility] None  08/02/2018 11:30 AM [facility] [place]  09/18/2018  8:30 AM Jessica Herb, MD [facility] None  10/08/2018 10:30 AM Jessica Lever, MD [facility] None    No orders of the defined types were placed in this encounter.      Subjective:   Patient ID:  [name] is a 62 y.o. (DOB 1955/11/22) female.  Chief Complaint: No chief complaint on file.   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  She was treated with cefepime and was discharged home on 7 days of doxycycline.  She completes doxycycline today.  She presents to the office today for consideration of cycle 1, day 1 of chemotherapy with the patient to receive Tecentriq, carboplatin, and etoposide.  The patient is managed by Dr. Fanny Rose. [name].  This provider was asked to see the patient today and infusion for her follow-up from her recent hospitalization and for her ongoing bilateral lower extremity edema.  Review of her labs today show that she has an albumin of 1.9.  Her LFTs show an AST elevated at 284 with an ALT of 100 and an alkaline phosphatase of 671.  Her white blood count returned at 12.1.  This was down from 14.3 when last checked 5 days ago.  Medications: I have reviewed the patient's current medications.  Allergies:  Allergies  Allergen Reactions  . Penicillins Nausea And Vomiting    Has patient had a PCN reaction causing immediate rash, facial/tongue/throat swelling, SOB or lightheadedness with hypotension: Unknown Has patient had a PCN reaction causing severe rash involving mucus membranes or skin necrosis: Unknown Has patient had a PCN reaction that required hospitalization: Unknown Has patient had a PCN reaction occurring within the last 10 years: Unknown If all of the above answers are "NO", then may proceed with Cephalosporin use.   Marland Kitchen Actonel [Risedronate Sodium]     Caused her to retain fluid and stomach problems  . Advair Diskus [Fluticasone-Salmeterol]   . Alendronate Sodium     Stomach upset /  esophageal burning  . Aspirin Nausea Only    Stomach burns; abdominal pain  . Azithromycin Nausea And Vomiting  . Levofloxacin     Reports hx of GI distress and GI bleed  . Morphine Itching and Swelling    Throat swells  . Nsaids     Swelling, GI upset   . Omnicef [Cefdinir]   . Erythromycin Rash  . Iodine Rash    Topical and IV      Past Medical History:  Diagnosis Date  . Alpha-1-antitrypsin deficiency ([place])   . Cervical cancer ([place]) 2004  . 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 This doesn't fit entirely with emphysema.      . Exposure to hepatitis C 09/12/2016  . Fibromyalgia   . GERD (gastroesophageal reflux disease)   . Hyperlipidemia   . IBS (irritable bowel syndrome)   . Obstructive sleep apnea 06/02/2008   NPSG 06/07/08, AHI 6.9/ hr, weight 220 lbs    . Osteoporosis   . Pulmonary fibrosis, unspecified ([place]) 09/29/2016   CXR 03/28/2016  . Rhinitis, nonallergic 05/08/2012  . 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 Vitamin D deficiency     Past Surgical History:  Procedure Laterality Date  . ABDOMINAL HYSTERECTOMY    . LUMBAR DISC SURGERY    . VIDEO BRONCHOSCOPY Bilateral 07/10/2018   Procedure: VIDEO BRONCHOSCOPY WITHOUT FLUORO;  Surgeon: Jessica Coder, MD;  Location[facility];  Service: Cardiopulmonary;  Laterality: Bilateral;    Family History  Problem Relation Age of Onset  . Alpha-1 antitrypsin deficiency Sister   . Osteoporosis Sister   . Pancreatic cancer Mother 67  . Osteoporosis Mother   . Irregular heart beat Mother   . Heart attack Father 66       Died age 76  . Migraines Father   . CAD Sister 58       Died age 63  . Osteoporosis Sister   . Lung cancer Brother   . CAD Brother 68       CABG.  Died from leukemia  . Leukemia Brother   . Heart attack Brother   . Alpha-1 antitrypsin deficiency Other   . Heart disease Brother   . Colon cancer Neg Hx     Social History   Socioeconomic History  . Marital status: Married    Spouse name: [name]  . Number of children: 2  . Years of education: Not on file  . Highest education level: Not on file  Occupational History  .  Occupation: disabled    Fish farm manager: UNEMPLOYED  Social Needs  . Financial resource strain: Not hard at all  . Food insecurity:    Worry: Never true    Inability: Never true  . Transportation needs:    Medical: No    Non-medical: No  Tobacco Use  . Smoking status: Former Smoker    Packs/day: 0.50    Types: Cigarettes    Start date: 11/20/1972    Last attempt to quit: 04/01/2012    Years since quitting: 6.3  . Smokeless tobacco: Never Used  Substance and Sexual Activity  . Alcohol use: No  . Drug use: No  . Sexual activity: Yes  Lifestyle  . Physical activity:    Days per week: 5 days    Minutes per session: 30 min  .  Stress: Not at all  Relationships  . Social connections:    Talks on phone: More than three times a week    Gets together: More than three times a week    Attends religious service: More than 4 times per year    Active member of club or organization: No    Attends meetings of clubs or organizations: Never    Relationship status: Not on file  . Intimate partner violence:    Fear of current or ex partner: No    Emotionally abused: No    Physically abused: No    Forced sexual activity: No  Other Topics Concern  . Not on file  Social History Narrative   Lives at home with husband.     Past Medical History, Surgical history, Social history, and Family history were reviewed and updated as appropriate.   Please see review of systems for further details on the patient's review from today.   Review of Systems:  Review of Systems  Constitutional: Negative for chills, diaphoresis, fatigue and fever.  HENT: Negative for congestion, postnasal drip, rhinorrhea, sore throat, trouble swallowing and voice change.   Respiratory: Positive for cough. Negative for chest tightness, shortness of breath and wheezing.   Cardiovascular: Positive for leg swelling. Negative for chest pain and palpitations.  Gastrointestinal: Negative for abdominal pain, constipation, diarrhea,  nausea and vomiting.  Musculoskeletal: Negative for back pain and myalgias.  Neurological: Negative for dizziness, light-headedness and headaches.    Objective:   Physical Exam:  There were no vitals taken for this visit. ECOG: 1  Physical Exam  Constitutional: No distress.  HENT:  Head: Normocephalic and atraumatic.  Right Ear: External ear normal.  Left Ear: External ear normal.  Mouth/Throat: Oropharynx is clear and moist. No oropharyngeal exudate.  Neck: Normal range of motion. Neck supple.  Cardiovascular: Normal rate, regular rhythm and normal heart sounds. Exam reveals no gallop and no friction rub.  No murmur heard. Pulmonary/Chest: Effort normal. No respiratory distress. She has no wheezes. She has no rales.  Decreased breath sounds throughout all lung fields.  Musculoskeletal: She exhibits edema (1+ pitting edema to the knees bilaterally.).  Lymphadenopathy:    She has no cervical adenopathy.  Neurological: She is alert. Coordination normal.  Skin: Skin is warm and dry. No rash noted. She is not diaphoretic. No erythema.  Psychiatric: She has a normal mood and affect. Her behavior is normal. Judgment and thought content normal.    Lab Review:     Component Value Date/Time   NA 132 (L) 07/24/2018 0753   NA 124 (L) 07/16/2018 0853   K 3.9 07/24/2018 0753   CL 95 (L) 07/24/2018 0753   CO2 28 07/24/2018 0753   GLUCOSE 108 (H) 07/24/2018 0753   BUN 6 07/24/2018 0753   BUN 8 07/16/2018 0853   CREATININE 0.57 07/24/2018 0753   CREATININE 0.68 02/13/2013 1523   CALCIUM 9.0 07/24/2018 0753   PROT 6.7 07/24/2018 0753   PROT 6.5 07/16/2018 0853   ALBUMIN 1.9 (L) 07/24/2018 0753   ALBUMIN 3.1 (L) 07/16/2018 0853   AST 284 (HH) 07/24/2018 0753   ALT 100 (H) 07/24/2018 0753   ALKPHOS 671 (H) 07/24/2018 0753   BILITOT 0.6 07/24/2018 0753   GFRNONAA >60 07/24/2018 0753   GFRNONAA >89 02/13/2013 1523   GFRAA >60 07/24/2018 0753   GFRAA >89 02/13/2013 1523         Component Value Date/Time   WBC 12.1 (H) 07/24/2018 4709  WBC 14.3 (H) 07/19/2018 0355   RBC 4.42 07/24/2018 0753   HGB 12.6 07/24/2018 0753   HGB 13.0 07/16/2018 0853   HCT 38.1 07/24/2018 0753   HCT 39.9 07/16/2018 0853   PLT 429 (H) 07/24/2018 0753   PLT 473 (H) 07/16/2018 0853   MCV 86.2 07/24/2018 0753   MCV 85 07/16/2018 0853   MCH 28.5 07/24/2018 0753   MCHC 33.1 07/24/2018 0753   RDW 17.3 (H) 07/24/2018 0753   RDW 15.7 (H) 07/16/2018 0853   LYMPHSABS 1.7 07/24/2018 0753   LYMPHSABS 2.2 07/16/2018 0853   MONOABS 0.9 07/24/2018 0753   EOSABS 0.0 07/24/2018 0753   EOSABS 0.0 07/16/2018 0853   BASOSABS 0.0 07/24/2018 0753   BASOSABS 0.1 07/16/2018 0853   -------------------------------  Imaging from last 24 hours (if applicable):  Radiology interpretation: Dg Chest 2 View  Result Date: 07/26/2018 CLINICAL DATA:  Lung cancer. Increased bilateral lower extremity edema. EXAM: CHEST - 2 VIEW COMPARISON:  Chest x-ray dated 07/17/2018. FINDINGS: Stable opacity at the LEFT lung base. Ill-defined opacity is now seen within the RIGHT upper lobe, corresponding to the ground-glass opacities better demonstrated on yesterday's PET-CT. Heart size appears stable. IMPRESSION: 1. Persistent opacity at the LEFT lung base, compatible with the combination of consolidation and pleural effusion better demonstrated on yesterday's PET-CT, the consolidation compatible with postobstructive atelectasis or pneumonia resulting caused by LEFT perihilar mass. 2. Subtle ill-defined opacities within the RIGHT upper lung, compatible with the ground-glass opacities on yesterday's PET-CT, most likely atypical pneumonia or asymmetric pulmonary edema. Differential includes atypical pneumonias such as viral or fungal, interstitial pneumonias, edema related to volume overload/CHF, chronic interstitial diseases, hypersensitivity pneumonitis, and respiratory bronchiolitis. Electronically Signed   By: Franki Cabot M.D.    On: 07/26/2018 15:18   Dg Chest 2 View  Result Date: 07/17/2018 CLINICAL DATA:  Fever, dyspnea, previous smoker and COPD. EXAM: CHEST - 2 VIEW COMPARISON:  07/16/2018 FINDINGS: Increased pulmonary consolidation and opacities at the left lung base since previous exam suggesting some interval progression of suspected pneumonia and/or atelectasis. Mild vascular congestion is also noted slightly increased in appearance since prior. Heart size is within normal limits. Mild aortic atherosclerosis without aneurysm. No aggressive osseous lesions. IMPRESSION: 1. There has been some interval progression of airspace disease at the left lung base with a more consolidated appearance suggesting some worsening of pneumonia. 2. Pulmonary vascular congestion is also noted slightly more accentuated on prior exam. 3.  Aortic atherosclerosis (ICD10-I70.0) Electronically Signed   By: Ashley Royalty M.D.   On: 07/17/2018 17:02   Dg Chest 2 View  Result Date: 07/16/2018 CLINICAL DATA:  Elevated white blood cell count. Recently diagnosed with left-sided small cell lung malignancy. History of COPD, former smoker. EXAM: CHEST - 2 VIEW COMPARISON:  Portable chest x-ray of July 10, 2018. Chest CT scan of July 06, 2018. FINDINGS: There is progressive increased interstitial and airspace opacity in the left lower lobe. A small amount of increased density in the left upper lobe is stable since the previous CT scan. The right lung is mildly hyperinflated and clear. A small left pleural effusion is suspected. The heart and pulmonary vascularity are normal. There is calcification in the wall of the aortic arch. The bony thorax exhibits no acute abnormality. IMPRESSION: Progressive interstitial and airspace opacities in the left lower lobe worrisome for pneumonia. This may reflect a post obstructive process given the significant left-sided mediastinal lymphadenopathy described on the CT scan of July 06, 2018. A  small amount of increased  density in the left upper lobe may reflect atelectasis or pneumonia. Underlying COPD-smoking related changes. Thoracic aortic atherosclerosis. Electronically Signed   By: David  Martinique M.D.   On: 07/16/2018 09:31   Ct Chest W Contrast  Addendum Date: 07/06/2018   ADDENDUM REPORT: 07/06/2018 18:12 ADDENDUM: These results were called by telephone at the time of interpretation on 07/06/2018 at 5:20 pm to Dr. Nehemiah Settle, who verbally acknowledged these results. Electronically Signed   By: Fidela Salisbury M.D.   On: 07/06/2018 18:12   Result Date: 07/06/2018 CLINICAL DATA:  Elevated liver function tests. EXAM: CT CHEST WITH CONTRAST TECHNIQUE: Multidetector CT imaging of the chest was performed during intravenous contrast administration. CONTRAST:  67mL OMNIPAQUE IOHEXOL 300 MG/ML  SOLN COMPARISON:  Abdominal series radiograph 07/06/2018 FINDINGS: Cardiovascular: The heart is normal in size. No pericardial effusion. Calcific atherosclerotic disease of the coronary arteries and aorta, mild. Mediastinum/Nodes: Left-sided mediastinal lymphadenopathy at all lymph node station. Index abnormal lymph node in the AP window measures 2.2 cm in short axis. Subcarinal lymphadenopathy is also present. In the left hilar/subhilar region there is a soft tissue mass versus conglomerate of abnormal lymph nodes which measures approximately 6.0 x 3.1 x 5.8 cm. This mass causes compression of the left lower lobar pulmonary arterial branches and left lower lobar main bronchus, with subsequent complete collapse of the left lower lobe. There is also mass effect to the lower esophagus. Lungs/Pleura: Post compressive atelectasis of the left lower lobe. Additionally, there are multiple areas of ground-glass opacity in bilateral lungs. An index lesion in the periphery of the right upper lobe measures 4.0 x 3.8 cm, image 42/155. Upper Abdomen: Diffusely heterogeneous nodular appearance of the liver with lobulated contours. 1.3 cm in short axis  lymph node in the gastrohepatic legs. Other small lymph nodes in porta hepatis. Musculoskeletal: No chest wall abnormality. No acute or significant osseous findings. IMPRESSION: Predominantly left-sided mediastinal, hilar and subcarinal lymphadenopathy. A 6 cm left hilar/subhilar mass may represent a primary malignancy versus a conglomerate of abnormal lymph nodes. It causes compression of the left lower lobe pulmonary arterial branches, left lower lobe main bronchus and the lower esophagus. Post compressive complete collapse of the left lower lobe. Multiple areas of ground-glass opacity in bilateral lungs. The index lesion in the periphery of the right upper lobe measures 4 cm. These may represent infectious or inflammatory consolidation, however bronchoalveolar carcinoma cannot be excluded. Diffusely heterogeneous nodular appearance of the liver. This may represent changes of cirrhosis or alternatively diffuse hepatic metastatic disease. Moderate lymphadenopathy in the upper abdomen. Bronchoscopy with tissue sampling may be considered to arrive to a histologic diagnosis. Aortic Atherosclerosis (ICD10-I70.0). Electronically Signed: By: Fidela Salisbury M.D. On: 07/06/2018 17:12   Nm Pet Image Initial (pi) Skull Base To Thigh  Result Date: 07/25/2018 CLINICAL DATA:  Initial treatment strategy for lung cancer. EXAM: NUCLEAR MEDICINE PET SKULL BASE TO THIGH TECHNIQUE: 8.58 mCi F-18 FDG was injected intravenously. Full-ring PET imaging was performed from the skull base to thigh after the radiotracer. CT data was obtained and used for attenuation correction and anatomic localization. Fasting blood glucose: 96 mg/dl COMPARISON:  CT chest 07/06/2018 FINDINGS: Mediastinal blood pool activity: SUV max 2.14 NECK: No hypermetabolic lymph nodes in the neck. Incidental CT findings: none CHEST: The left lower lobe perihilar lung mass with mediastinal invasion is again noted. This has a maximum diameter of approximately 5.3  cm within SUV max of 10.79. Complete obstruction of the left lower  lobe bronchus with extensive postobstructive consolidation of the entire left lower lobe noted. Adjacent left hilar lymph node is hypermetabolic with an SUV max of 9.48. Enlarged and hypermetabolic left pre-vascular lymph node measures 2.2 cm and has an SUV max of 7.93. Enlarged left paratracheal lymph node measures 1.4 cm and has an SUV max of 3.14. No hypermetabolic subcarinal or right hilar lymph nodes. No hypermetabolic axillary or supraclavicular nodes. Scattered multifocal ill-defined areas of ground-glass attenuation are noted predominantly involving the right lung. These have a somewhat different configuration than on study from 07/06/2018 favoring inflammatory/infectious process. The dominant subpleural ground-glass attenuating area within the lateral right upper lobe is smaller on today's study compatible with resolving inflammation/infection. Incidental CT findings: Aortic atherosclerosis. Calcification in the LAD and RCA coronary arteries noted. ABDOMEN/PELVIS: Within a background of probable early cirrhotic changes. Lesions are difficult to identified on the unenhanced CT images. Within the lateral segment of left lobe of liver there is a lesion with SUV max of 5.53 measuring approximately 2.2 cm. Segment 4 lesion measures approximately 2.1 cm and has an SUV max of 7.37. Segment 8 lesion measures approximately 1.7 cm and has an SUV max of 5.28. No abnormal uptake within the pancreas or spleen. No abnormal uptake within the adrenal glands. Hypermetabolic porta hepatic lymph nodes are identified. Index node measures 1.5 cm and has an SUV max of 7.68. Status post bilateral pelvic nodal dissection. Incidental CT findings: Aortic atherosclerosis identified. There is diffuse hepatic steatosis with enlargement of the lateral segment of left lobe and caudate lobe of liver. The contour of the liver is diffusely nodular. A small volume of ascites  is identified within the pelvis. Status post hysterectomy. SKELETON: There is a suspicious focus of mild increased uptake within the proximal right femur within SUV max of 2.4. On the corresponding CT images there is a well-circumscribed soft tissue lesion within the bone marrow measuring 1.7 cm. Solitary focus of increased uptake localizing to the lateral aspect of the left ninth rib has an SUV max of 4.47. Underlying posttraumatic deformity is noted. Incidental CT findings: none IMPRESSION: 1. Large intensely hypermetabolic left perihilar lung mass is identified invading the mediastinum and obstructing the left lower lobe bronchus. Findings compatible with primary bronchogenic carcinoma. Hypermetabolic left hilar and left mediastinal lymph nodes are identified. Additionally, there are multifocal areas of increased uptake within the liver suspicious for metastatic disease. At least 1 focus of increased uptake is identified within the proximal right femur which is concerning for bone metastases. 2. Multiple enlarged and hypermetabolic upper abdominal lymph nodes. Suspicious for nodal metastasis. 3. Morphologic features of liver compatible with cirrhosis. In a patient who is at increased risk for hepatoma consider further characterization of hypermetabolic liver lesions with contrast enhanced liver protocol MRI. 4. Multiple patchy areas of ground-glass attenuation within the right lung exhibit a different morphology when compared with 07/06/2018 and are favored to represent sequelae of inflammatory or infectious process. 5.  Aortic Atherosclerosis (ICD10-I70.0). Electronically Signed   By: Kerby Moors M.D.   On: 07/25/2018 17:45   Dg Chest Port 1 View  Result Date: 07/10/2018 CLINICAL DATA:  Post bronchoscopy and left lung biopsy. Now with increased cough. EXAM: PORTABLE CHEST 1 VIEW COMPARISON:  Portable chest x-ray of July 08, 2018 FINDINGS: The right lung is well-expanded. Coarse lung markings in the  right upper lobe persists. On the left there is patchy density at the left lung base which is stable. The interstitial markings are slightly more  in the left lung. There is no pneumothorax, pneumomediastinum, or pleural effusion. The heart and pulmonary vascularity are normal. IMPRESSION: There is no postprocedure complication following bronchoscopy on the left with lung biopsies. Electronically Signed   By: David  Martinique M.D.   On: 07/10/2018 12:45   Dg Chest Port 1 View  Result Date: 07/08/2018 CLINICAL DATA:  Rhonchi. EXAM: PORTABLE CHEST 1 VIEW COMPARISON:  None. FINDINGS: The heart size and mediastinal contours are within normal limits. No pneumothorax or pleural effusion is noted. Right lung is clear. Mild left basilar subsegmental atelectasis is noted. The visualized skeletal structures are unremarkable. IMPRESSION: Mild left basilar subsegmental atelectasis. Electronically Signed   By: Marijo Conception, M.D.   On: 07/08/2018 07:22   Dg Abd Acute W/chest  Result Date: 07/06/2018 CLINICAL DATA:  Nodule vomiting 2 months. EXAM: DG ABDOMEN ACUTE W/ 1V CHEST COMPARISON:  Chest x-ray 06/12/2017 FINDINGS: Lungs are adequately inflated with 1 cm nodule opacity over the lateral left midlung. Linear scarring lateral left base. Subtle hazy density over the right upper lobe. No effusion. Cardiac silhouette is within normal. Mild increased density over the AP window. Bowel gas pattern is nonobstructive. No free peritoneal air. Multiple surgical clips over the pelvis. Degenerate change of the spine and hips. Interbody fusion of L4-5. IMPRESSION: Subtle hazy density over the right upper lobe which may be due to acute infection. 1 cm nodular opacity over the lateral left midlung. Increased density and prominence of the AP window as cannot exclude mass/adenopathy. Consider contrast-enhanced chest CT for further evaluation. Nonobstructive bowel gas pattern. Electronically Signed   By: Marin Olp M.D.   On:  07/06/2018 14:43   US Abdomen Limited Ruq  Result Date: 07/19/2018 CLINICAL DATA:  Abnormal LFTs, history COPD, pulmonary fibrosis, cervical cancer, hyperlipidemia, GERD, irritable bowel syndrome EXAM: ULTRASOUND ABDOMEN LIMITED RIGHT UPPER QUADRANT COMPARISON:  CT abdomen and pelvis 07/17/2013 FINDINGS: Gallbladder: Normally distended without stones or wall thickening. No pericholecystic fluid or sonographic Murphy sign. Common bile duct: Diameter: 2 mm diameter, normal Liver: Heterogeneous echogenicity. Multiple hepatic nodules are identified, ranging from hypoechoic to mildly hyperechoic and measuring up to 2.1 cm in size. Lesions are suspicious for hepatic metastatic disease and are new since the prior CT. Portal vein is patent on color Doppler imaging with normal direction of blood flow towards the liver. No RIGHT upper quadrant free fluid. Visualized portion of pancreas normal appearance. IMPRESSION: Heterogeneous hepatic echogenicity with multiple small hepatic nodules suspicious for metastatic disease; further evaluation by CT imaging with IV and oral contrast recommended. Electronically Signed   By: Lavonia Dana M.D.   On: 07/19/2018 08:16        This case was discussed with Dr. Benay Rose. He expressed agreement with my management of this patient.

## 2018-07-26 NOTE — Patient Instructions (Addendum)
[facility] Discharge Instructions for Patients Receiving Chemotherapy  Today you received the following chemotherapy agents: etoposide  To help prevent nausea and vomiting after your treatment, we encourage you to take your nausea medication as directed.   If you develop nausea and vomiting that is not controlled by your nausea medication, call the clinic.   BELOW ARE SYMPTOMS THAT SHOULD BE REPORTED IMMEDIATELY:  *FEVER GREATER THAN 100.5 F  *CHILLS WITH OR WITHOUT FEVER  NAUSEA AND VOMITING THAT IS NOT CONTROLLED WITH YOUR NAUSEA MEDICATION  *UNUSUAL SHORTNESS OF BREATH  *UNUSUAL BRUISING OR BLEEDING  TENDERNESS IN MOUTH AND THROAT WITH OR WITHOUT PRESENCE OF ULCERS  *URINARY PROBLEMS  *BOWEL PROBLEMS  UNUSUAL RASH Items with * indicate a potential emergency and should be followed up as soon as possible.  Feel free to call the clinic should you have any questions or concerns. The clinic phone number is [phone].  Please show the CHEMO ALERT CARD at check-in to the Emergency Department and triage nurse.   

## 2018-07-26 NOTE — Progress Notes (Signed)
[facility] Progress Note   [name] [id number] 1956-11-12 62 y.o.  [name] is managed by Dr. Fanny Bien. 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  The patient's labs were reviewed with Dr. Sullivan Lone.  The patient's nurse was instructed to proceed with her treatment today.  Postobstructive pneumonia: [name] is status post a recent hospitalization for a postobstructive pneumonia.  She was hospitalized from 08/28 through 07/19/2018.  She was referred for a chest x-ray today which again showed a persistent opacity in the left lung base compatible with a combination of consolidation and pleural effusion.  The consolidation was compatible with a postobstructive atelectasis or pneumonia.  Subtle ill-defined opacities within the right upper lobe compatible with groundglass opacities on a PET CT scan from 07/25/2018 which were felt to most likely represent an atypical pneumonia or asymmetric pulmonary edema.  She was given an additional 7-day course of doxycycline.  She has a follow-up appointment with Dr. Julien Nordmann on 07/30/2018.  Bilateral lower extremity edema: The patient was instructed to elevate her legs and to begin using thigh-high compression stockings.  It is hoped that the patient has bilateral lower extremity edema will improve with improved nutritional intake.  Please see After Visit Summary for patient specific  instructions.  Future Appointments  Date Time Provider [place]  07/30/2018  8:00 AM [facility] LAB 6 [facility] None  07/30/2018  8:30 AM Curt Bears, MD [facility] None  07/30/2018 10:00 AM [facility] [place] FLUSH [facility] None  08/02/2018 10:00 AM [facility] ROOM [facility] None  08/02/2018 11:30 AM [facility] 1 [facility] [place]  09/18/2018  8:30 AM [name], MD [facility] None  10/08/2018 10:30 AM Deneise Lever, MD [facility] None    Orders Placed This Encounter  Procedures  . CBC with Differential (Cancer Center Only)       Subjective:   Patient ID:  [name] is a 62 y.o. (DOB 1956/08/09) female.  Chief Complaint: No chief complaint on file.   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  She presents to the office today for consideration of cycle 1, day 3 of chemotherapy with the patient to receive etoposide only.  She continues to have lower extremity edema which she believes is slightly worse.  She continues to have a cough with shortness of breath.  She has tolerated chemotherapy well thus far.  Medications: I have reviewed the patient's current medications.  Allergies:  Allergies  Allergen Reactions  . Penicillins Nausea And Vomiting    Has patient had a PCN reaction causing immediate rash, facial/tongue/throat swelling, SOB or lightheadedness with hypotension: Unknown Has patient had a PCN reaction causing severe rash involving mucus membranes or skin necrosis: Unknown Has patient had a PCN reaction that required hospitalization: Unknown Has patient had a PCN reaction occurring within the last 10  years: Unknown If all of the above answers are "NO", then may proceed with Cephalosporin use.   Marland Kitchen Actonel [Risedronate Sodium]     Caused her to retain fluid and stomach  problems  . Advair Diskus [Fluticasone-Salmeterol]   . Alendronate Sodium     Stomach upset / esophageal burning  . Aspirin Nausea Only    Stomach burns; abdominal pain  . Azithromycin Nausea And Vomiting  . Levofloxacin     Reports hx of GI distress and GI bleed  . Morphine Itching and Swelling    Throat swells  . Nsaids     Swelling, GI upset   . Omnicef [Cefdinir]   . Erythromycin Rash  . Iodine Rash    Topical and IV    Past Medical History:  Diagnosis Date  . Alpha-1-antitrypsin deficiency ([place])   . Cervical cancer ([place]) 2004  . 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 This doesn't fit entirely with emphysema.      . Exposure to hepatitis C 09/12/2016  . Fibromyalgia   . GERD (gastroesophageal reflux disease)   . Hyperlipidemia   . IBS (irritable bowel syndrome)   . Obstructive sleep apnea 06/02/2008   NPSG 06/07/08, AHI 6.9/ hr, weight 220 lbs    . Osteoporosis   . Pulmonary fibrosis, unspecified ([place]) 09/29/2016   CXR 03/28/2016  . Rhinitis, nonallergic 05/08/2012  . 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 Vitamin D deficiency     Past Surgical History:  Procedure Laterality Date  . ABDOMINAL HYSTERECTOMY    . LUMBAR DISC SURGERY    . VIDEO BRONCHOSCOPY Bilateral 07/10/2018   Procedure: VIDEO BRONCHOSCOPY WITHOUT FLUORO;  Surgeon: Laurin Coder, MD;  Location[facility];  Service: Cardiopulmonary;  Laterality: Bilateral;    Family History  Problem Relation Age of Onset  . Alpha-1 antitrypsin deficiency Sister   . Osteoporosis Sister   . Pancreatic cancer Mother 70  . Osteoporosis Mother   . Irregular heart beat Mother   . Heart attack Father 19       Died age 46  . Migraines Father   . CAD Sister 29       Died age 68  . Osteoporosis Sister   .  Lung cancer Brother   . CAD Brother 20       CABG.  Died from leukemia  . Leukemia Brother   . Heart attack Brother   . Alpha-1 antitrypsin deficiency Other   . Heart disease Brother   . Colon cancer Neg Hx     Social History   Socioeconomic History  . Marital status: Married    Spouse name: Audry Pili  . Number of children: 2  . Years of education: Not on file  . Highest education level: Not on file  Occupational History  . Occupation: disabled    Fish farm manager: UNEMPLOYED  Social Needs  . Financial resource strain: Not hard at all  . Food insecurity:    Worry: Never true    Inability: Never true  . Transportation needs:    Medical: No    Non-medical: No  Tobacco Use  . Smoking status: Former Smoker    Packs/day: 0.50    Types: Cigarettes    Start date: 11/20/1972    Last attempt to quit: 04/01/2012    Years since  quitting: 6.3  . Smokeless tobacco: Never Used  Substance and Sexual Activity  . Alcohol use: No  . Drug use: No  . Sexual activity: Yes  Lifestyle  . Physical activity:    Days per week: 5 days    Minutes per session: 30 min  . Stress: Not at all  Relationships  . Social connections:    Talks on phone: More than three times a week    Gets together: More than three times a week    Attends religious service: More than 4 times per year    Active member of club or organization: No    Attends meetings of clubs or organizations: Never    Relationship status: Not on file  . Intimate partner violence:    Fear of current or ex partner: No    Emotionally abused: No    Physically abused: No    Forced sexual activity: No  Other Topics Concern  . Not on file  Social History Narrative   Lives at home with husband.     Past Medical History, Surgical history, Social history, and Family history were reviewed and updated as appropriate.   Please see review of systems for further details on the patient's review from today.   Review of Systems:  Review of Systems   Constitutional: Negative for chills, diaphoresis, fatigue and fever.  HENT: Negative for congestion, postnasal drip, rhinorrhea, sore throat, trouble swallowing and voice change.   Respiratory: Positive for cough. Negative for chest tightness, shortness of breath and wheezing.   Cardiovascular: Positive for leg swelling. Negative for chest pain and palpitations.  Gastrointestinal: Negative for abdominal pain, constipation, diarrhea, nausea and vomiting.  Musculoskeletal: Negative for back pain and myalgias.  Neurological: Negative for dizziness, light-headedness and headaches.    Objective:   Physical Exam:  There were no vitals taken for this visit. ECOG: 1  Physical Exam  Constitutional: No distress.  HENT:  Head: Normocephalic and atraumatic.  Cardiovascular: Normal rate, regular rhythm and normal heart sounds. Exam reveals no gallop and no friction rub.  No murmur heard. Pulmonary/Chest: Effort normal. No respiratory distress. She has rales (Bibasilar Rales greater on the left than right).  Decreased breath sounds throughout all lung fields.  Musculoskeletal: She exhibits edema (1+ pitting edema to the knees bilaterally.).  Neurological: She is alert. Coordination normal.  Skin: Skin is warm and dry. No rash noted. She is not diaphoretic. No erythema.  Psychiatric: She has a normal mood and affect. Her behavior is normal. Judgment and thought content normal.    Lab Review:     Component Value Date/Time   NA 132 (L) 07/24/2018 0753   NA 124 (L) 07/16/2018 0853   K 3.9 07/24/2018 0753   CL 95 (L) 07/24/2018 0753   CO2 28 07/24/2018 0753   GLUCOSE 108 (H) 07/24/2018 0753   BUN 6 07/24/2018 0753   BUN 8 07/16/2018 0853   CREATININE 0.57 07/24/2018 0753   CREATININE 0.68 02/13/2013 1523   CALCIUM 9.0 07/24/2018 0753   PROT 6.7 07/24/2018 0753   PROT 6.5 07/16/2018 0853   ALBUMIN 1.9 (L) 07/24/2018 0753   ALBUMIN 3.1 (L) 07/16/2018 0853   AST 284 (HH) 07/24/2018 0753    ALT 100 (H) 07/24/2018 0753   ALKPHOS 671 (H) 07/24/2018 0753   BILITOT 0.6 07/24/2018 0753   GFRNONAA >60 07/24/2018 0753   GFRNONAA >89 02/13/2013 1523   GFRAA >60 07/24/2018 0753   GFRAA >89 02/13/2013 1523  Component Value Date/Time   WBC 12.1 (H) 07/24/2018 0753   WBC 14.3 (H) 07/19/2018 0355   RBC 4.42 07/24/2018 0753   HGB 12.6 07/24/2018 0753   HGB 13.0 07/16/2018 0853   HCT 38.1 07/24/2018 0753   HCT 39.9 07/16/2018 0853   PLT 429 (H) 07/24/2018 0753   PLT 473 (H) 07/16/2018 0853   MCV 86.2 07/24/2018 0753   MCV 85 07/16/2018 0853   MCH 28.5 07/24/2018 0753   MCHC 33.1 07/24/2018 0753   RDW 17.3 (H) 07/24/2018 0753   RDW 15.7 (H) 07/16/2018 0853   LYMPHSABS 1.7 07/24/2018 0753   LYMPHSABS 2.2 07/16/2018 0853   MONOABS 0.9 07/24/2018 0753   EOSABS 0.0 07/24/2018 0753   EOSABS 0.0 07/16/2018 0853   BASOSABS 0.0 07/24/2018 0753   BASOSABS 0.1 07/16/2018 0853   -------------------------------  Imaging from last 24 hours (if applicable):  Radiology interpretation: Dg Chest 2 View  Result Date: 07/26/2018 CLINICAL DATA:  Lung cancer. Increased bilateral lower extremity edema. EXAM: CHEST - 2 VIEW COMPARISON:  Chest x-ray dated 07/17/2018. FINDINGS: Stable opacity at the LEFT lung base. Ill-defined opacity is now seen within the RIGHT upper lobe, corresponding to the ground-glass opacities better demonstrated on yesterday's PET-CT. Heart size appears stable. IMPRESSION: 1. Persistent opacity at the LEFT lung base, compatible with the combination of consolidation and pleural effusion better demonstrated on yesterday's PET-CT, the consolidation compatible with postobstructive atelectasis or pneumonia resulting caused by LEFT perihilar mass. 2. Subtle ill-defined opacities within the RIGHT upper lung, compatible with the ground-glass opacities on yesterday's PET-CT, most likely atypical pneumonia or asymmetric pulmonary edema. Differential includes atypical pneumonias such  as viral or fungal, interstitial pneumonias, edema related to volume overload/CHF, chronic interstitial diseases, hypersensitivity pneumonitis, and respiratory bronchiolitis. Electronically Signed   By: Franki Cabot M.D.   On: 07/26/2018 15:18   Dg Chest 2 View  Result Date: 07/17/2018 CLINICAL DATA:  Fever, dyspnea, previous smoker and COPD. EXAM: CHEST - 2 VIEW COMPARISON:  07/16/2018 FINDINGS: Increased pulmonary consolidation and opacities at the left lung base since previous exam suggesting some interval progression of suspected pneumonia and/or atelectasis. Mild vascular congestion is also noted slightly increased in appearance since prior. Heart size is within normal limits. Mild aortic atherosclerosis without aneurysm. No aggressive osseous lesions. IMPRESSION: 1. There has been some interval progression of airspace disease at the left lung base with a more consolidated appearance suggesting some worsening of pneumonia. 2. Pulmonary vascular congestion is also noted slightly more accentuated on prior exam. 3.  Aortic atherosclerosis (ICD10-I70.0) Electronically Signed   By: Ashley Royalty M.D.   On: 07/17/2018 17:02   Dg Chest 2 View  Result Date: 07/16/2018 CLINICAL DATA:  Elevated white blood cell count. Recently diagnosed with left-sided small cell lung malignancy. History of COPD, former smoker. EXAM: CHEST - 2 VIEW COMPARISON:  Portable chest x-ray of July 10, 2018. Chest CT scan of July 06, 2018. FINDINGS: There is progressive increased interstitial and airspace opacity in the left lower lobe. A small amount of increased density in the left upper lobe is stable since the previous CT scan. The right lung is mildly hyperinflated and clear. A small left pleural effusion is suspected. The heart and pulmonary vascularity are normal. There is calcification in the wall of the aortic arch. The bony thorax exhibits no acute abnormality. IMPRESSION: Progressive interstitial and airspace opacities in  the left lower lobe worrisome for pneumonia. This may reflect a post obstructive process given the significant  left-sided mediastinal lymphadenopathy described on the CT scan of July 06, 2018. A small amount of increased density in the left upper lobe may reflect atelectasis or pneumonia. Underlying COPD-smoking related changes. Thoracic aortic atherosclerosis. Electronically Signed   By: David  Martinique M.D.   On: 07/16/2018 09:31   Ct Chest W Contrast  Addendum Date: 07/06/2018   ADDENDUM REPORT: 07/06/2018 18:12 ADDENDUM: These results were called by telephone at the time of interpretation on 07/06/2018 at 5:20 pm to Dr. Nehemiah Settle, who verbally acknowledged these results. Electronically Signed   By: Fidela Salisbury M.D.   On: 07/06/2018 18:12   Result Date: 07/06/2018 CLINICAL DATA:  Elevated liver function tests. EXAM: CT CHEST WITH CONTRAST TECHNIQUE: Multidetector CT imaging of the chest was performed during intravenous contrast administration. CONTRAST:  90mL OMNIPAQUE IOHEXOL 300 MG/ML  SOLN COMPARISON:  Abdominal series radiograph 07/06/2018 FINDINGS: Cardiovascular: The heart is normal in size. No pericardial effusion. Calcific atherosclerotic disease of the coronary arteries and aorta, mild. Mediastinum/Nodes: Left-sided mediastinal lymphadenopathy at all lymph node station. Index abnormal lymph node in the AP window measures 2.2 cm in short axis. Subcarinal lymphadenopathy is also present. In the left hilar/subhilar region there is a soft tissue mass versus conglomerate of abnormal lymph nodes which measures approximately 6.0 x 3.1 x 5.8 cm. This mass causes compression of the left lower lobar pulmonary arterial branches and left lower lobar main bronchus, with subsequent complete collapse of the left lower lobe. There is also mass effect to the lower esophagus. Lungs/Pleura: Post compressive atelectasis of the left lower lobe. Additionally, there are multiple areas of ground-glass opacity in  bilateral lungs. An index lesion in the periphery of the right upper lobe measures 4.0 x 3.8 cm, image 42/155. Upper Abdomen: Diffusely heterogeneous nodular appearance of the liver with lobulated contours. 1.3 cm in short axis lymph node in the gastrohepatic legs. Other small lymph nodes in porta hepatis. Musculoskeletal: No chest wall abnormality. No acute or significant osseous findings. IMPRESSION: Predominantly left-sided mediastinal, hilar and subcarinal lymphadenopathy. A 6 cm left hilar/subhilar mass may represent a primary malignancy versus a conglomerate of abnormal lymph nodes. It causes compression of the left lower lobe pulmonary arterial branches, left lower lobe main bronchus and the lower esophagus. Post compressive complete collapse of the left lower lobe. Multiple areas of ground-glass opacity in bilateral lungs. The index lesion in the periphery of the right upper lobe measures 4 cm. These may represent infectious or inflammatory consolidation, however bronchoalveolar carcinoma cannot be excluded. Diffusely heterogeneous nodular appearance of the liver. This may represent changes of cirrhosis or alternatively diffuse hepatic metastatic disease. Moderate lymphadenopathy in the upper abdomen. Bronchoscopy with tissue sampling may be considered to arrive to a histologic diagnosis. Aortic Atherosclerosis (ICD10-I70.0). Electronically Signed: By: Fidela Salisbury M.D. On: 07/06/2018 17:12   Nm Pet Image Initial (pi) Skull Base To Thigh  Result Date: 07/25/2018 CLINICAL DATA:  Initial treatment strategy for lung cancer. EXAM: NUCLEAR MEDICINE PET SKULL BASE TO THIGH TECHNIQUE: 8.58 mCi F-18 FDG was injected intravenously. Full-ring PET imaging was performed from the skull base to thigh after the radiotracer. CT data was obtained and used for attenuation correction and anatomic localization. Fasting blood glucose: 96 mg/dl COMPARISON:  CT chest 07/06/2018 FINDINGS: Mediastinal blood pool activity:  SUV max 2.14 NECK: No hypermetabolic lymph nodes in the neck. Incidental CT findings: none CHEST: The left lower lobe perihilar lung mass with mediastinal invasion is again noted. This has a maximum diameter of approximately 5.3  cm within SUV max of 10.79. Complete obstruction of the left lower lobe bronchus with extensive postobstructive consolidation of the entire left lower lobe noted. Adjacent left hilar lymph node is hypermetabolic with an SUV max of 9.48. Enlarged and hypermetabolic left pre-vascular lymph node measures 2.2 cm and has an SUV max of 7.93. Enlarged left paratracheal lymph node measures 1.4 cm and has an SUV max of 3.14. No hypermetabolic subcarinal or right hilar lymph nodes. No hypermetabolic axillary or supraclavicular nodes. Scattered multifocal ill-defined areas of ground-glass attenuation are noted predominantly involving the right lung. These have a somewhat different configuration than on study from 07/06/2018 favoring inflammatory/infectious process. The dominant subpleural ground-glass attenuating area within the lateral right upper lobe is smaller on today's study compatible with resolving inflammation/infection. Incidental CT findings: Aortic atherosclerosis. Calcification in the LAD and RCA coronary arteries noted. ABDOMEN/PELVIS: Within a background of probable early cirrhotic changes. Lesions are difficult to identified on the unenhanced CT images. Within the lateral segment of left lobe of liver there is a lesion with SUV max of 5.53 measuring approximately 2.2 cm. Segment 4 lesion measures approximately 2.1 cm and has an SUV max of 7.37. Segment 8 lesion measures approximately 1.7 cm and has an SUV max of 5.28. No abnormal uptake within the pancreas or spleen. No abnormal uptake within the adrenal glands. Hypermetabolic porta hepatic lymph nodes are identified. Index node measures 1.5 cm and has an SUV max of 7.68. Status post bilateral pelvic nodal dissection. Incidental CT  findings: Aortic atherosclerosis identified. There is diffuse hepatic steatosis with enlargement of the lateral segment of left lobe and caudate lobe of liver. The contour of the liver is diffusely nodular. A small volume of ascites is identified within the pelvis. Status post hysterectomy. SKELETON: There is a suspicious focus of mild increased uptake within the proximal right femur within SUV max of 2.4. On the corresponding CT images there is a well-circumscribed soft tissue lesion within the bone marrow measuring 1.7 cm. Solitary focus of increased uptake localizing to the lateral aspect of the left ninth rib has an SUV max of 4.47. Underlying posttraumatic deformity is noted. Incidental CT findings: none IMPRESSION: 1. Large intensely hypermetabolic left perihilar lung mass is identified invading the mediastinum and obstructing the left lower lobe bronchus. Findings compatible with primary bronchogenic carcinoma. Hypermetabolic left hilar and left mediastinal lymph nodes are identified. Additionally, there are multifocal areas of increased uptake within the liver suspicious for metastatic disease. At least 1 focus of increased uptake is identified within the proximal right femur which is concerning for bone metastases. 2. Multiple enlarged and hypermetabolic upper abdominal lymph nodes. Suspicious for nodal metastasis. 3. Morphologic features of liver compatible with cirrhosis. In a patient who is at increased risk for hepatoma consider further characterization of hypermetabolic liver lesions with contrast enhanced liver protocol MRI. 4. Multiple patchy areas of ground-glass attenuation within the right lung exhibit a different morphology when compared with 07/06/2018 and are favored to represent sequelae of inflammatory or infectious process. 5.  Aortic Atherosclerosis (ICD10-I70.0). Electronically Signed   By: Kerby Moors M.D.   On: 07/25/2018 17:45   Dg Chest Port 1 View  Result Date:  07/10/2018 CLINICAL DATA:  Post bronchoscopy and left lung biopsy. Now with increased cough. EXAM: PORTABLE CHEST 1 VIEW COMPARISON:  Portable chest x-ray of July 08, 2018 FINDINGS: The right lung is well-expanded. Coarse lung markings in the right upper lobe persists. On the left there is patchy density at the  left lung base which is stable. The interstitial markings are slightly more in the left lung. There is no pneumothorax, pneumomediastinum, or pleural effusion. The heart and pulmonary vascularity are normal. IMPRESSION: There is no postprocedure complication following bronchoscopy on the left with lung biopsies. Electronically Signed   By: David  Martinique M.D.   On: 07/10/2018 12:45   Dg Chest Port 1 View  Result Date: 07/08/2018 CLINICAL DATA:  Rhonchi. EXAM: PORTABLE CHEST 1 VIEW COMPARISON:  None. FINDINGS: The heart size and mediastinal contours are within normal limits. No pneumothorax or pleural effusion is noted. Right lung is clear. Mild left basilar subsegmental atelectasis is noted. The visualized skeletal structures are unremarkable. IMPRESSION: Mild left basilar subsegmental atelectasis. Electronically Signed   By: Marijo Conception, M.D.   On: 07/08/2018 07:22   Dg Abd Acute W/chest  Result Date: 07/06/2018 CLINICAL DATA:  Nodule vomiting 2 months. EXAM: DG ABDOMEN ACUTE W/ 1V CHEST COMPARISON:  Chest x-ray 06/12/2017 FINDINGS: Lungs are adequately inflated with 1 cm nodule opacity over the lateral left midlung. Linear scarring lateral left base. Subtle hazy density over the right upper lobe. No effusion. Cardiac silhouette is within normal. Mild increased density over the AP window. Bowel gas pattern is nonobstructive. No free peritoneal air. Multiple surgical clips over the pelvis. Degenerate change of the spine and hips. Interbody fusion of L4-5. IMPRESSION: Subtle hazy density over the right upper lobe which may be due to acute infection. 1 cm nodular opacity over the lateral left  midlung. Increased density and prominence of the AP window as cannot exclude mass/adenopathy. Consider contrast-enhanced chest CT for further evaluation. Nonobstructive bowel gas pattern. Electronically Signed   By: Marin Olp M.D.   On: 07/06/2018 14:43   US Abdomen Limited Ruq  Result Date: 07/19/2018 CLINICAL DATA:  Abnormal LFTs, history COPD, pulmonary fibrosis, cervical cancer, hyperlipidemia, GERD, irritable bowel syndrome EXAM: ULTRASOUND ABDOMEN LIMITED RIGHT UPPER QUADRANT COMPARISON:  CT abdomen and pelvis 07/17/2013 FINDINGS: Gallbladder: Normally distended without stones or wall thickening. No pericholecystic fluid or sonographic Murphy sign. Common bile duct: Diameter: 2 mm diameter, normal Liver: Heterogeneous echogenicity. Multiple hepatic nodules are identified, ranging from hypoechoic to mildly hyperechoic and measuring up to 2.1 cm in size. Lesions are suspicious for hepatic metastatic disease and are new since the prior CT. Portal vein is patent on color Doppler imaging with normal direction of blood flow towards the liver. No RIGHT upper quadrant free fluid. Visualized portion of pancreas normal appearance. IMPRESSION: Heterogeneous hepatic echogenicity with multiple small hepatic nodules suspicious for metastatic disease; further evaluation by CT imaging with IV and oral contrast recommended. Electronically Signed   By: Lavonia Dana M.D.   On: 07/19/2018 08:16        This case was discussed with Dr. Sullivan Lone. He expressed agreement with my management of this patient.

## 2018-07-30 ENCOUNTER — Encounter (HOSPITAL_COMMUNITY): Payer: Self-pay

## 2018-07-30 ENCOUNTER — Encounter: Payer: Self-pay | Admitting: Internal Medicine

## 2018-07-30 ENCOUNTER — Other Ambulatory Visit: Payer: Self-pay | Admitting: Nurse Practitioner

## 2018-07-30 ENCOUNTER — Ambulatory Visit (INDEPENDENT_AMBULATORY_CARE_PROVIDER_SITE_OTHER): Payer: Medicare Other

## 2018-07-30 ENCOUNTER — Other Ambulatory Visit: Payer: Self-pay | Admitting: Radiology

## 2018-07-30 ENCOUNTER — Inpatient Hospital Stay (HOSPITAL_BASED_OUTPATIENT_CLINIC_OR_DEPARTMENT_OTHER): Payer: Medicare Other | Admitting: Internal Medicine

## 2018-07-30 ENCOUNTER — Inpatient Hospital Stay: Payer: Medicare Other

## 2018-07-30 VITALS — BP 121/67 | HR 98 | Temp 97.8°F | Resp 16 | Ht 65.0 in | Wt 171.4 lb

## 2018-07-30 DIAGNOSIS — C787 Secondary malignant neoplasm of liver and intrahepatic bile duct: Secondary | ICD-10-CM | POA: Diagnosis not present

## 2018-07-30 DIAGNOSIS — R600 Localized edema: Secondary | ICD-10-CM

## 2018-07-30 DIAGNOSIS — E440 Moderate protein-calorie malnutrition: Secondary | ICD-10-CM

## 2018-07-30 DIAGNOSIS — E785 Hyperlipidemia, unspecified: Secondary | ICD-10-CM

## 2018-07-30 DIAGNOSIS — K589 Irritable bowel syndrome without diarrhea: Secondary | ICD-10-CM | POA: Diagnosis not present

## 2018-07-30 DIAGNOSIS — R590 Localized enlarged lymph nodes: Secondary | ICD-10-CM | POA: Diagnosis not present

## 2018-07-30 DIAGNOSIS — C3411 Malignant neoplasm of upper lobe, right bronchus or lung: Secondary | ICD-10-CM

## 2018-07-30 DIAGNOSIS — Z79899 Other long term (current) drug therapy: Secondary | ICD-10-CM

## 2018-07-30 DIAGNOSIS — J449 Chronic obstructive pulmonary disease, unspecified: Secondary | ICD-10-CM | POA: Diagnosis not present

## 2018-07-30 DIAGNOSIS — E559 Vitamin D deficiency, unspecified: Secondary | ICD-10-CM | POA: Diagnosis not present

## 2018-07-30 DIAGNOSIS — E8801 Alpha-1-antitrypsin deficiency: Secondary | ICD-10-CM

## 2018-07-30 DIAGNOSIS — C3490 Malignant neoplasm of unspecified part of unspecified bronchus or lung: Secondary | ICD-10-CM

## 2018-07-30 DIAGNOSIS — I10 Essential (primary) hypertension: Secondary | ICD-10-CM

## 2018-07-30 DIAGNOSIS — C3492 Malignant neoplasm of unspecified part of left bronchus or lung: Secondary | ICD-10-CM

## 2018-07-30 DIAGNOSIS — J189 Pneumonia, unspecified organism: Secondary | ICD-10-CM | POA: Diagnosis not present

## 2018-07-30 DIAGNOSIS — K219 Gastro-esophageal reflux disease without esophagitis: Secondary | ICD-10-CM | POA: Diagnosis not present

## 2018-07-30 DIAGNOSIS — M797 Fibromyalgia: Secondary | ICD-10-CM | POA: Diagnosis not present

## 2018-07-30 DIAGNOSIS — Z7689 Persons encountering health services in other specified circumstances: Secondary | ICD-10-CM | POA: Diagnosis not present

## 2018-07-30 DIAGNOSIS — J8410 Pulmonary fibrosis, unspecified: Secondary | ICD-10-CM

## 2018-07-30 DIAGNOSIS — G4730 Sleep apnea, unspecified: Secondary | ICD-10-CM

## 2018-07-30 DIAGNOSIS — C3412 Malignant neoplasm of upper lobe, left bronchus or lung: Secondary | ICD-10-CM | POA: Diagnosis not present

## 2018-07-30 DIAGNOSIS — C7951 Secondary malignant neoplasm of bone: Secondary | ICD-10-CM | POA: Diagnosis not present

## 2018-07-30 DIAGNOSIS — J440 Chronic obstructive pulmonary disease with acute lower respiratory infection: Secondary | ICD-10-CM | POA: Diagnosis not present

## 2018-07-30 DIAGNOSIS — Z5112 Encounter for antineoplastic immunotherapy: Secondary | ICD-10-CM

## 2018-07-30 DIAGNOSIS — Z5111 Encounter for antineoplastic chemotherapy: Secondary | ICD-10-CM | POA: Diagnosis not present

## 2018-07-30 DIAGNOSIS — Z87891 Personal history of nicotine dependence: Secondary | ICD-10-CM

## 2018-07-30 LAB — CBC WITH DIFFERENTIAL (CANCER CENTER ONLY)
BASOS ABS: 0 10*3/uL (ref 0.0–0.1)
Basophils Relative: 1 %
EOS PCT: 1 %
Eosinophils Absolute: 0.1 10*3/uL (ref 0.0–0.5)
HCT: 34.3 % — ABNORMAL LOW (ref 34.8–46.6)
Hemoglobin: 11.6 g/dL (ref 11.6–15.9)
LYMPHS ABS: 1.3 10*3/uL (ref 0.9–3.3)
Lymphocytes Relative: 20 %
MCH: 29 pg (ref 25.1–34.0)
MCHC: 33.9 g/dL (ref 31.5–36.0)
MCV: 85.4 fL (ref 79.5–101.0)
MONO ABS: 0.1 10*3/uL (ref 0.1–0.9)
MONOS PCT: 2 %
NEUTROS ABS: 5 10*3/uL (ref 1.5–6.5)
Neutrophils Relative %: 76 %
PLATELETS: 324 10*3/uL (ref 145–400)
RBC: 4.01 MIL/uL (ref 3.70–5.45)
RDW: 17 % — AB (ref 11.2–14.5)
WBC Count: 6.6 10*3/uL (ref 3.9–10.3)

## 2018-07-30 LAB — CMP (CANCER CENTER ONLY)
ALBUMIN: 2.1 g/dL — AB (ref 3.5–5.0)
ALT: 67 U/L — ABNORMAL HIGH (ref 0–44)
AST: 124 U/L — AB (ref 15–41)
Alkaline Phosphatase: 410 U/L — ABNORMAL HIGH (ref 38–126)
Anion gap: 7 (ref 5–15)
BILIRUBIN TOTAL: 0.6 mg/dL (ref 0.3–1.2)
BUN: 10 mg/dL (ref 6–20)
CO2: 32 mmol/L (ref 22–32)
Calcium: 8.6 mg/dL — ABNORMAL LOW (ref 8.9–10.3)
Chloride: 91 mmol/L — ABNORMAL LOW (ref 98–111)
Creatinine: 0.54 mg/dL (ref 0.44–1.00)
GFR, Est AFR Am: >60 mL/min (ref >60)
GFR, Estimated: >60 mL/min (ref >60)
GLUCOSE: 98 mg/dL (ref 70–99)
POTASSIUM: 3.7 mmol/L (ref 3.5–5.1)
SODIUM: 130 mmol/L — AB (ref 135–145)
TOTAL PROTEIN: 6.3 g/dL — AB (ref 6.5–8.1)

## 2018-07-30 MED ORDER — PEGFILGRASTIM-CBQV 6 MG/0.6ML ~~LOC~~ SOSY
6.0000 mg | PREFILLED_SYRINGE | Freq: Once | SUBCUTANEOUS | Status: AC
  Administered 2018-07-30: 6 mg via SUBCUTANEOUS

## 2018-07-30 NOTE — Progress Notes (Signed)
[facility] Telephone:([phone]) [phone]   [phone]) [phone]  OFFICE PROGRESS NOTE  [name], MD [place] [address]  DIAGNOSIS: Extensive stage (T3, N2, M1a)  small cell lung cancer diagnosed in August 2019 and presented with large left hilar/subhilar mass in addition to mediastinal lymphadenopathy and contracture and mass in the right upper lobe.  PRIOR THERAPY:None  CURRENT THERAPY: Therapy with carboplatin for AUC of 5 on day 1, etoposide 100 mg/M2 on days 1, 2 and 3 as well as Tecentriq (Atezolizumab) 1200 mg IV every 3 weeks with Neulasta support.  Status post 1 cycle.  INTERVAL HISTORY: [name] 62 y.o. female returns to the clinic today for follow-up visit accompanied by her husband.  The patient is feeling fine today with no concerning complaints except for fatigue and weakness in the lower extremities.  She started the first cycle of her treatment last week.  She tolerated the first cycle of her treatment well with no concerning adverse effects so far.  She denied having any chest pain but continues to have shortness of breath increased with exertion with mild cough and no hemoptysis.  She denied having any nausea, vomiting, diarrhea or constipation.  She has no recent weight loss or night sweats.  The patient had a PET scan performed recently and she is here for evaluation and discussion of her risk her results.  MEDICAL HISTORY: Past Medical History:  Diagnosis Date  . Alpha-1-antitrypsin deficiency ([place])   . Cervical cancer ([place]) 2004  . 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 This doesn't fit entirely with emphysema.      . Exposure to hepatitis C 09/12/2016  . Fibromyalgia   . GERD (gastroesophageal reflux disease)   . Hyperlipidemia   . IBS (irritable  bowel syndrome)   . Obstructive sleep apnea 06/02/2008   NPSG 06/07/08, AHI 6.9/ hr, weight 220 lbs    . Osteoporosis   . Pulmonary fibrosis, unspecified ([place]) 09/29/2016   CXR 03/28/2016  . Rhinitis, nonallergic 05/08/2012  . 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 Vitamin D deficiency     ALLERGIES:  is allergic to penicillins; actonel [risedronate sodium]; advair diskus [fluticasone-salmeterol]; alendronate sodium; aspirin; azithromycin; levofloxacin; morphine; nsaids; omnicef [cefdinir]; erythromycin; and iodine.  MEDICATIONS:  Current Outpatient Medications  Medication Sig Dispense Refill  . albuterol (PROVENTIL) (2.5 MG/3ML) 0.083% nebulizer solution Take 3 mLs (2.5 mg total) by nebulization every 6 (six) hours as needed. 90 mL 3  . allopurinol (ZYLOPRIM) 300 MG tablet Take 1 tablet (300 mg total) by mouth daily. Begin on 07/25/2018 14 tablet 0  . amitriptyline (ELAVIL) 75 MG tablet Take 75 mg by mouth at bedtime.      . benzonatate (TESSALON) 100 MG capsule Take 1 capsule (100 mg total) by mouth 3 (three) times daily as needed for cough. 20 capsule 0  . BEVESPI AEROSPHERE 9-4.8 MCG/ACT AERO 2 PUFFS 2 TIMES A DAY (Patient taking differently: Inhale 2 puffs into the lungs 2 (two) times daily. ) 10.7 g 0  . doxycycline (VIBRA-TABS) 100 MG tablet Take 1 tablet (100 mg total) by mouth 2 (two) times daily. 14 tablet 0  . ezetimibe (ZETIA) 10 MG tablet TAKE 1 TABLET ONCE A DAY 90 tablet 1  . feeding supplement,  ENSURE ENLIVE, (ENSURE ENLIVE) LIQD Take 237 mLs by mouth 2 (two) times daily between meals. 60 Bottle 0  . fluticasone (FLONASE) 50 MCG/ACT nasal spray USE 2 SPRAYS IN EACH NOSTRIL ONCE DAILY. 16 g 5  . folic acid (FOLVITE) 1 MG tablet Take 1 mg by mouth daily.    [place] Kitchen HYDROcodone-acetaminophen (NORCO/VICODIN) 5-325 MG tablet Take 1 tablet by mouth 3 (three) times daily.    . Nebulizers (COMPRESSOR NEBULIZER) MISC 1 Device by Does not apply route as  needed. 1 each prn  . omeprazole (PRILOSEC) 40 MG capsule TAKE (1) CAPSULE DAILY (Patient taking differently: Take 40 mg by mouth daily. ) 90 capsule 1  . PROAIR HFA 108 (90 Base) MCG/ACT inhaler 2 PUFFS EVERY 4 HOURS AS NEEDED FOR WHEEZING (Patient taking differently: Inhale 2 puffs into the lungs every 4 (four) hours as needed for wheezing or shortness of breath. ) 8.5 g 2  . prochlorperazine (COMPAZINE) 10 MG tablet Take 1 tablet (10 mg total) by mouth every 6 (six) hours as needed for nausea or vomiting. (Patient not taking: Reported on 07/17/2018) 30 tablet 0  . rosuvastatin (CRESTOR) 40 MG tablet TAKE 1/2 TABLET ONCE DAILY (Patient taking differently: Take 20 mg by mouth daily. ) 45 tablet 1   No current facility-administered medications for this visit.     SURGICAL HISTORY:  Past Surgical History:  Procedure Laterality Date  . ABDOMINAL HYSTERECTOMY    . LUMBAR DISC SURGERY    . VIDEO BRONCHOSCOPY Bilateral 07/10/2018   Procedure: VIDEO BRONCHOSCOPY WITHOUT FLUORO;  Surgeon: Laurin Coder, MD;  Location: [facility];  Service: Cardiopulmonary;  Laterality: Bilateral;    REVIEW OF SYSTEMS:  Constitutional: positive for fatigue Eyes: negative Ears, nose, mouth, throat, and face: negative Respiratory: positive for cough and dyspnea on exertion Cardiovascular: negative Gastrointestinal: negative Genitourinary:negative Integument/breast: negative Hematologic/lymphatic: negative Musculoskeletal:negative Neurological: negative Behavioral/Psych: negative Endocrine: negative Allergic/Immunologic: negative   PHYSICAL EXAMINATION: General appearance: alert, cooperative, fatigued and no distress Head: Normocephalic, without obvious abnormality, atraumatic Neck: no adenopathy, no JVD, supple, symmetrical, trachea midline and thyroid not enlarged, symmetric, no tenderness/mass/nodules Lymph nodes: Cervical, supraclavicular, and axillary nodes normal. Resp: clear to auscultation  bilaterally Back: symmetric, no curvature. ROM normal. No CVA tenderness. Cardio: regular rate and rhythm, S1, S2 normal, no murmur, click, rub or gallop GI: soft, non-tender; bowel sounds normal; no masses,  no organomegaly Extremities: extremities normal, atraumatic, no cyanosis or edema Neurologic: Alert and oriented X 3, normal strength and tone. Normal symmetric reflexes. Normal coordination and gait  ECOG PERFORMANCE STATUS: 1 - Symptomatic but completely ambulatory  Blood pressure 121/67, pulse 98, temperature 97.8 F (36.6 C), temperature source Oral, resp. rate 16, height 5\' 5"  (1.651 m), weight 171 lb 6.4 oz (77.7 kg), SpO2 96 %.  LABORATORY DATA: Lab Results  Component Value Date   WBC 6.6 07/30/2018   HGB 11.6 07/30/2018   HCT 34.3 (L) 07/30/2018   MCV 85.4 07/30/2018   PLT 324 07/30/2018      Chemistry      Component Value Date/Time   NA 132 (L) 07/24/2018 0753   NA 124 (L) 07/16/2018 0853   K 3.9 07/24/2018 0753   CL 95 (L) 07/24/2018 0753   CO2 28 07/24/2018 0753   BUN 6 07/24/2018 0753   BUN 8 07/16/2018 0853   CREATININE 0.57 07/24/2018 0753   CREATININE 0.68 02/13/2013 1523      Component Value Date/Time   CALCIUM 9.0 07/24/2018 0753   ALKPHOS  671 (H) 07/24/2018 0753   AST 284 (HH) 07/24/2018 0753   ALT 100 (H) 07/24/2018 0753   BILITOT 0.6 07/24/2018 0753       RADIOGRAPHIC STUDIES: Dg Chest 2 View  Result Date: 07/26/2018 CLINICAL DATA:  Lung cancer. Increased bilateral lower extremity edema. EXAM: CHEST - 2 VIEW COMPARISON:  Chest x-ray dated 07/17/2018. FINDINGS: Stable opacity at the LEFT lung base. Ill-defined opacity is now seen within the RIGHT upper lobe, corresponding to the ground-glass opacities better demonstrated on yesterday's PET-CT. Heart size appears stable. IMPRESSION: 1. Persistent opacity at the LEFT lung base, compatible with the combination of consolidation and pleural effusion better demonstrated on yesterday's PET-CT, the  consolidation compatible with postobstructive atelectasis or pneumonia resulting caused by LEFT perihilar mass. 2. Subtle ill-defined opacities within the RIGHT upper lung, compatible with the ground-glass opacities on yesterday's PET-CT, most likely atypical pneumonia or asymmetric pulmonary edema. Differential includes atypical pneumonias such as viral or fungal, interstitial pneumonias, edema related to volume overload/CHF, chronic interstitial diseases, hypersensitivity pneumonitis, and respiratory bronchiolitis. Electronically Signed   By: Franki Cabot M.D.   On: 07/26/2018 15:18   Dg Chest 2 View  Result Date: 07/17/2018 CLINICAL DATA:  Fever, dyspnea, previous smoker and COPD. EXAM: CHEST - 2 VIEW COMPARISON:  07/16/2018 FINDINGS: Increased pulmonary consolidation and opacities at the left lung base since previous exam suggesting some interval progression of suspected pneumonia and/or atelectasis. Mild vascular congestion is also noted slightly increased in appearance since prior. Heart size is within normal limits. Mild aortic atherosclerosis without aneurysm. No aggressive osseous lesions. IMPRESSION: 1. There has been some interval progression of airspace disease at the left lung base with a more consolidated appearance suggesting some worsening of pneumonia. 2. Pulmonary vascular congestion is also noted slightly more accentuated on prior exam. 3.  Aortic atherosclerosis (ICD10-I70.0) Electronically Signed   By: Ashley Royalty M.D.   On: 07/17/2018 17:02   Dg Chest 2 View  Result Date: 07/16/2018 CLINICAL DATA:  Elevated white blood cell count. Recently diagnosed with left-sided small cell lung malignancy. History of COPD, former smoker. EXAM: CHEST - 2 VIEW COMPARISON:  Portable chest x-ray of July 10, 2018. Chest CT scan of July 06, 2018. FINDINGS: There is progressive increased interstitial and airspace opacity in the left lower lobe. A small amount of increased density in the left upper lobe  is stable since the previous CT scan. The right lung is mildly hyperinflated and clear. A small left pleural effusion is suspected. The heart and pulmonary vascularity are normal. There is calcification in the wall of the aortic arch. The bony thorax exhibits no acute abnormality. IMPRESSION: Progressive interstitial and airspace opacities in the left lower lobe worrisome for pneumonia. This may reflect a post obstructive process given the significant left-sided mediastinal lymphadenopathy described on the CT scan of July 06, 2018. A small amount of increased density in the left upper lobe may reflect atelectasis or pneumonia. Underlying COPD-smoking related changes. Thoracic aortic atherosclerosis. Electronically Signed   By: David  Martinique M.D.   On: 07/16/2018 09:31   Ct Chest W Contrast  Addendum Date: 07/06/2018   ADDENDUM REPORT: 07/06/2018 18:12 ADDENDUM: These results were called by telephone at the time of interpretation on 07/06/2018 at 5:20 pm to Dr. Nehemiah Settle, who verbally acknowledged these results. Electronically Signed   By: Fidela Salisbury M.D.   On: 07/06/2018 18:12   Result Date: 07/06/2018 CLINICAL DATA:  Elevated liver function tests. EXAM: CT CHEST WITH CONTRAST TECHNIQUE: Multidetector  CT imaging of the chest was performed during intravenous contrast administration. CONTRAST:  27mL OMNIPAQUE IOHEXOL 300 MG/ML  SOLN COMPARISON:  Abdominal series radiograph 07/06/2018 FINDINGS: Cardiovascular: The heart is normal in size. No pericardial effusion. Calcific atherosclerotic disease of the coronary arteries and aorta, mild. Mediastinum/Nodes: Left-sided mediastinal lymphadenopathy at all lymph node station. Index abnormal lymph node in the AP window measures 2.2 cm in short axis. Subcarinal lymphadenopathy is also present. In the left hilar/subhilar region there is a soft tissue mass versus conglomerate of abnormal lymph nodes which measures approximately 6.0 x 3.1 x 5.8 cm. This mass causes  compression of the left lower lobar pulmonary arterial branches and left lower lobar main bronchus, with subsequent complete collapse of the left lower lobe. There is also mass effect to the lower esophagus. Lungs/Pleura: Post compressive atelectasis of the left lower lobe. Additionally, there are multiple areas of ground-glass opacity in bilateral lungs. An index lesion in the periphery of the right upper lobe measures 4.0 x 3.8 cm, image 42/155. Upper Abdomen: Diffusely heterogeneous nodular appearance of the liver with lobulated contours. 1.3 cm in short axis lymph node in the gastrohepatic legs. Other small lymph nodes in porta hepatis. Musculoskeletal: No chest wall abnormality. No acute or significant osseous findings. IMPRESSION: Predominantly left-sided mediastinal, hilar and subcarinal lymphadenopathy. A 6 cm left hilar/subhilar mass may represent a primary malignancy versus a conglomerate of abnormal lymph nodes. It causes compression of the left lower lobe pulmonary arterial branches, left lower lobe main bronchus and the lower esophagus. Post compressive complete collapse of the left lower lobe. Multiple areas of ground-glass opacity in bilateral lungs. The index lesion in the periphery of the right upper lobe measures 4 cm. These may represent infectious or inflammatory consolidation, however bronchoalveolar carcinoma cannot be excluded. Diffusely heterogeneous nodular appearance of the liver. This may represent changes of cirrhosis or alternatively diffuse hepatic metastatic disease. Moderate lymphadenopathy in the upper abdomen. Bronchoscopy with tissue sampling may be considered to arrive to a histologic diagnosis. Aortic Atherosclerosis (ICD10-I70.0). Electronically Signed: By: Fidela Salisbury M.D. On: 07/06/2018 17:12   Nm Pet Image Initial (pi) Skull Base To Thigh  Result Date: 07/25/2018 CLINICAL DATA:  Initial treatment strategy for lung cancer. EXAM: NUCLEAR MEDICINE PET SKULL BASE TO  THIGH TECHNIQUE: 8.58 mCi F-18 FDG was injected intravenously. Full-ring PET imaging was performed from the skull base to thigh after the radiotracer. CT data was obtained and used for attenuation correction and anatomic localization. Fasting blood glucose: 96 mg/dl COMPARISON:  CT chest 07/06/2018 FINDINGS: Mediastinal blood pool activity: SUV max 2.14 NECK: No hypermetabolic lymph nodes in the neck. Incidental CT findings: none CHEST: The left lower lobe perihilar lung mass with mediastinal invasion is again noted. This has a maximum diameter of approximately 5.3 cm within SUV max of 10.79. Complete obstruction of the left lower lobe bronchus with extensive postobstructive consolidation of the entire left lower lobe noted. Adjacent left hilar lymph node is hypermetabolic with an SUV max of 9.48. Enlarged and hypermetabolic left pre-vascular lymph node measures 2.2 cm and has an SUV max of 7.93. Enlarged left paratracheal lymph node measures 1.4 cm and has an SUV max of 3.14. No hypermetabolic subcarinal or right hilar lymph nodes. No hypermetabolic axillary or supraclavicular nodes. Scattered multifocal ill-defined areas of ground-glass attenuation are noted predominantly involving the right lung. These have a somewhat different configuration than on study from 07/06/2018 favoring inflammatory/infectious process. The dominant subpleural ground-glass attenuating area within the lateral right upper  lobe is smaller on today's study compatible with resolving inflammation/infection. Incidental CT findings: Aortic atherosclerosis. Calcification in the LAD and RCA coronary arteries noted. ABDOMEN/PELVIS: Within a background of probable early cirrhotic changes. Lesions are difficult to identified on the unenhanced CT images. Within the lateral segment of left lobe of liver there is a lesion with SUV max of 5.53 measuring approximately 2.2 cm. Segment 4 lesion measures approximately 2.1 cm and has an SUV max of 7.37.  Segment 8 lesion measures approximately 1.7 cm and has an SUV max of 5.28. No abnormal uptake within the pancreas or spleen. No abnormal uptake within the adrenal glands. Hypermetabolic porta hepatic lymph nodes are identified. Index node measures 1.5 cm and has an SUV max of 7.68. Status post bilateral pelvic nodal dissection. Incidental CT findings: Aortic atherosclerosis identified. There is diffuse hepatic steatosis with enlargement of the lateral segment of left lobe and caudate lobe of liver. The contour of the liver is diffusely nodular. A small volume of ascites is identified within the pelvis. Status post hysterectomy. SKELETON: There is a suspicious focus of mild increased uptake within the proximal right femur within SUV max of 2.4. On the corresponding CT images there is a well-circumscribed soft tissue lesion within the bone marrow measuring 1.7 cm. Solitary focus of increased uptake localizing to the lateral aspect of the left ninth rib has an SUV max of 4.47. Underlying posttraumatic deformity is noted. Incidental CT findings: none IMPRESSION: 1. Large intensely hypermetabolic left perihilar lung mass is identified invading the mediastinum and obstructing the left lower lobe bronchus. Findings compatible with primary bronchogenic carcinoma. Hypermetabolic left hilar and left mediastinal lymph nodes are identified. Additionally, there are multifocal areas of increased uptake within the liver suspicious for metastatic disease. At least 1 focus of increased uptake is identified within the proximal right femur which is concerning for bone metastases. 2. Multiple enlarged and hypermetabolic upper abdominal lymph nodes. Suspicious for nodal metastasis. 3. Morphologic features of liver compatible with cirrhosis. In a patient who is at increased risk for hepatoma consider further characterization of hypermetabolic liver lesions with contrast enhanced liver protocol MRI. 4. Multiple patchy areas of  ground-glass attenuation within the right lung exhibit a different morphology when compared with 07/06/2018 and are favored to represent sequelae of inflammatory or infectious process. 5.  Aortic Atherosclerosis (ICD10-I70.0). Electronically Signed   By: Kerby Moors M.D.   On: 07/25/2018 17:45   Dg Chest Port 1 View  Result Date: 07/10/2018 CLINICAL DATA:  Post bronchoscopy and left lung biopsy. Now with increased cough. EXAM: PORTABLE CHEST 1 VIEW COMPARISON:  Portable chest x-ray of July 08, 2018 FINDINGS: The right lung is well-expanded. Coarse lung markings in the right upper lobe persists. On the left there is patchy density at the left lung base which is stable. The interstitial markings are slightly more in the left lung. There is no pneumothorax, pneumomediastinum, or pleural effusion. The heart and pulmonary vascularity are normal. IMPRESSION: There is no postprocedure complication following bronchoscopy on the left with lung biopsies. Electronically Signed   By: David  Martinique M.D.   On: 07/10/2018 12:45   Dg Chest Port 1 View  Result Date: 07/08/2018 CLINICAL DATA:  Rhonchi. EXAM: PORTABLE CHEST 1 VIEW COMPARISON:  None. FINDINGS: The heart size and mediastinal contours are within normal limits. No pneumothorax or pleural effusion is noted. Right lung is clear. Mild left basilar subsegmental atelectasis is noted. The visualized skeletal structures are unremarkable. IMPRESSION: Mild left basilar subsegmental atelectasis.  Electronically Signed   By: Marijo Conception, M.D.   On: 07/08/2018 07:22   Dg Abd Acute W/chest  Result Date: 07/06/2018 CLINICAL DATA:  Nodule vomiting 2 months. EXAM: DG ABDOMEN ACUTE W/ 1V CHEST COMPARISON:  Chest x-ray 06/12/2017 FINDINGS: Lungs are adequately inflated with 1 cm nodule opacity over the lateral left midlung. Linear scarring lateral left base. Subtle hazy density over the right upper lobe. No effusion. Cardiac silhouette is within normal. Mild increased  density over the AP window. Bowel gas pattern is nonobstructive. No free peritoneal air. Multiple surgical clips over the pelvis. Degenerate change of the spine and hips. Interbody fusion of L4-5. IMPRESSION: Subtle hazy density over the right upper lobe which may be due to acute infection. 1 cm nodular opacity over the lateral left midlung. Increased density and prominence of the AP window as cannot exclude mass/adenopathy. Consider contrast-enhanced chest CT for further evaluation. Nonobstructive bowel gas pattern. Electronically Signed   By: Marin Olp M.D.   On: 07/06/2018 14:43   US Abdomen Limited Ruq  Result Date: 07/19/2018 CLINICAL DATA:  Abnormal LFTs, history COPD, pulmonary fibrosis, cervical cancer, hyperlipidemia, GERD, irritable bowel syndrome EXAM: ULTRASOUND ABDOMEN LIMITED RIGHT UPPER QUADRANT COMPARISON:  CT abdomen and pelvis 07/17/2013 FINDINGS: Gallbladder: Normally distended without stones or wall thickening. No pericholecystic fluid or sonographic Murphy sign. Common bile duct: Diameter: 2 mm diameter, normal Liver: Heterogeneous echogenicity. Multiple hepatic nodules are identified, ranging from hypoechoic to mildly hyperechoic and measuring up to 2.1 cm in size. Lesions are suspicious for hepatic metastatic disease and are new since the prior CT. Portal vein is patent on color Doppler imaging with normal direction of blood flow towards the liver. No RIGHT upper quadrant free fluid. Visualized portion of pancreas normal appearance. IMPRESSION: Heterogeneous hepatic echogenicity with multiple small hepatic nodules suspicious for metastatic disease; further evaluation by CT imaging with IV and oral contrast recommended. Electronically Signed   By: Lavonia Dana M.D.   On: 07/19/2018 08:16    ASSESSMENT AND PLAN: This is a very pleasant 62 years old white female recently diagnosed with extensive stage small cell lung cancer.  The patient had a recent PET scan that showed multiple  metastatic disease in the lung, liver as well as bone. She was supposed to have MRI of the brain performed last week but this was not scheduled for unclear reason.  I will arrange for the patient to have MRI of the brain performed in the next few days. The patient is currently on systemic chemotherapy with carboplatin, etoposide and Tecentriq status post 1 cycle started last week. She tolerated the first cycle of her treatment well with no concerning adverse effects. I recommended for the patient to continue her current treatment and she is expected to come back in 2 weeks for evaluation before starting cycle #2. She was advised to call immediately if she has any concerning symptoms in the interval. The patient voices understanding of current disease status and treatment options and is in agreement with the current care plan.  All questions were answered. The patient knows to call the clinic with any problems, questions or concerns. We can certainly see the patient much sooner if necessary.  I spent 15 minutes counseling the patient face to face. The total time spent in the appointment was 25 minutes.  Disclaimer: This note was dictated with voice recognition software. Similar sounding words can inadvertently be transcribed and may not be corrected upon review.

## 2018-07-30 NOTE — Patient Instructions (Signed)
Pegfilgrastim injection What is this medicine? PEGFILGRASTIM (PEG fil gra stim) is a long-acting granulocyte colony-stimulating factor that stimulates the growth of neutrophils, a type of white blood cell important in the body's fight against infection. It is used to reduce the incidence of fever and infection in patients with certain types of cancer who are receiving chemotherapy that affects the bone marrow, and to increase survival after being exposed to high doses of radiation. This medicine may be used for other purposes; ask your health care provider or pharmacist if you have questions. COMMON BRAND NAME(S): Neulasta What should I tell my health care provider before I take this medicine? They need to know if you have any of these conditions: -kidney disease -latex allergy -ongoing radiation therapy -sickle cell disease -skin reactions to acrylic adhesives (On-Body Injector only) -an unusual or allergic reaction to pegfilgrastim, filgrastim, other medicines, foods, dyes, or preservatives -pregnant or trying to get pregnant -breast-feeding How should I use this medicine? This medicine is for injection under the skin. If you get this medicine at home, you will be taught how to prepare and give the pre-filled syringe or how to use the On-body Injector. Refer to the patient Instructions for Use for detailed instructions. Use exactly as directed. Tell your healthcare provider immediately if you suspect that the On-body Injector may not have performed as intended or if you suspect the use of the On-body Injector resulted in a missed or partial dose. It is important that you put your used needles and syringes in a special sharps container. Do not put them in a trash can. If you do not have a sharps container, call your pharmacist or healthcare provider to get one. Talk to your pediatrician regarding the use of this medicine in children. While this drug may be prescribed for selected conditions,  precautions do apply. Overdosage: If you think you have taken too much of this medicine contact a poison control center or emergency room at once. NOTE: This medicine is only for you. Do not share this medicine with others. What if I miss a dose? It is important not to miss your dose. Call your doctor or health care professional if you miss your dose. If you miss a dose due to an On-body Injector failure or leakage, a new dose should be administered as soon as possible using a single prefilled syringe for manual use. What may interact with this medicine? Interactions have not been studied. Give your health care provider a list of all the medicines, herbs, non-prescription drugs, or dietary supplements you use. Also tell them if you smoke, drink alcohol, or use illegal drugs. Some items may interact with your medicine. This list may not describe all possible interactions. Give your health care provider a list of all the medicines, herbs, non-prescription drugs, or dietary supplements you use. Also tell them if you smoke, drink alcohol, or use illegal drugs. Some items may interact with your medicine. What should I watch for while using this medicine? You may need blood work done while you are taking this medicine. If you are going to need a MRI, CT scan, or other procedure, tell your doctor that you are using this medicine (On-Body Injector only). What side effects may I notice from receiving this medicine? Side effects that you should report to your doctor or health care professional as soon as possible: -allergic reactions like skin rash, itching or hives, swelling of the face, lips, or tongue -dizziness -fever -pain, redness, or irritation at site   where injected -pinpoint red spots on the skin -red or dark-brown urine -shortness of breath or breathing problems -stomach or side pain, or pain at the shoulder -swelling -tiredness -trouble passing urine or change in the amount of urine Side  effects that usually do not require medical attention (report to your doctor or health care professional if they continue or are bothersome): -bone pain -muscle pain This list may not describe all possible side effects. Call your doctor for medical advice about side effects. You may report side effects to FDA at 1-800-FDA-1088. Where should I keep my medicine? Keep out of the reach of children. Store pre-filled syringes in a refrigerator between 2 and 8 degrees C (36 and 46 degrees F). Do not freeze. Keep in carton to protect from light. Throw away this medicine if it is left out of the refrigerator for more than 48 hours. Throw away any unused medicine after the expiration date. NOTE: This sheet is a summary. It may not cover all possible information. If you have questions about this medicine, talk to your doctor, pharmacist, or health care provider.  2018 [employer] (2016-11-02 12:58:03)  

## 2018-07-31 ENCOUNTER — Telehealth: Payer: Self-pay | Admitting: Internal Medicine

## 2018-07-31 ENCOUNTER — Other Ambulatory Visit: Payer: Self-pay | Admitting: Radiology

## 2018-07-31 ENCOUNTER — Other Ambulatory Visit: Payer: Self-pay | Admitting: Family Medicine

## 2018-07-31 NOTE — Telephone Encounter (Signed)
Spoke to pts husband regarding upcoming appts.

## 2018-08-02 ENCOUNTER — Ambulatory Visit (HOSPITAL_COMMUNITY)
Admission: RE | Admit: 2018-08-02 | Discharge: 2018-08-02 | Disposition: A | Discharge status: Home / Self Care | Payer: Medicare Other | Source: Ambulatory Visit | Attending: Internal Medicine | Admitting: Internal Medicine

## 2018-08-02 ENCOUNTER — Other Ambulatory Visit: Payer: Self-pay

## 2018-08-02 ENCOUNTER — Encounter (HOSPITAL_COMMUNITY): Payer: Self-pay

## 2018-08-02 DIAGNOSIS — Z806 Family history of leukemia: Secondary | ICD-10-CM | POA: Diagnosis not present

## 2018-08-02 DIAGNOSIS — M810 Age-related osteoporosis without current pathological fracture: Secondary | ICD-10-CM | POA: Insufficient documentation

## 2018-08-02 DIAGNOSIS — J440 Chronic obstructive pulmonary disease with acute lower respiratory infection: Secondary | ICD-10-CM | POA: Diagnosis not present

## 2018-08-02 DIAGNOSIS — Z87891 Personal history of nicotine dependence: Secondary | ICD-10-CM | POA: Insufficient documentation

## 2018-08-02 DIAGNOSIS — Z888 Allergy status to other drugs, medicaments and biological substances status: Secondary | ICD-10-CM | POA: Diagnosis not present

## 2018-08-02 DIAGNOSIS — K219 Gastro-esophageal reflux disease without esophagitis: Secondary | ICD-10-CM | POA: Insufficient documentation

## 2018-08-02 DIAGNOSIS — M797 Fibromyalgia: Secondary | ICD-10-CM | POA: Diagnosis not present

## 2018-08-02 DIAGNOSIS — Z800 Family history of malignant neoplasm of digestive organs: Secondary | ICD-10-CM | POA: Diagnosis not present

## 2018-08-02 DIAGNOSIS — J8410 Pulmonary fibrosis, unspecified: Secondary | ICD-10-CM | POA: Insufficient documentation

## 2018-08-02 DIAGNOSIS — Z8262 Family history of osteoporosis: Secondary | ICD-10-CM | POA: Diagnosis not present

## 2018-08-02 DIAGNOSIS — Z881 Allergy status to other antibiotic agents status: Secondary | ICD-10-CM | POA: Insufficient documentation

## 2018-08-02 DIAGNOSIS — Z79899 Other long term (current) drug therapy: Secondary | ICD-10-CM | POA: Diagnosis not present

## 2018-08-02 DIAGNOSIS — C3492 Malignant neoplasm of unspecified part of left bronchus or lung: Secondary | ICD-10-CM | POA: Diagnosis not present

## 2018-08-02 DIAGNOSIS — C3490 Malignant neoplasm of unspecified part of unspecified bronchus or lung: Secondary | ICD-10-CM | POA: Diagnosis not present

## 2018-08-02 DIAGNOSIS — Z8709 Personal history of other diseases of the respiratory system: Secondary | ICD-10-CM | POA: Diagnosis not present

## 2018-08-02 DIAGNOSIS — Z885 Allergy status to narcotic agent status: Secondary | ICD-10-CM | POA: Diagnosis not present

## 2018-08-02 DIAGNOSIS — E785 Hyperlipidemia, unspecified: Secondary | ICD-10-CM | POA: Insufficient documentation

## 2018-08-02 DIAGNOSIS — Z886 Allergy status to analgesic agent status: Secondary | ICD-10-CM | POA: Insufficient documentation

## 2018-08-02 DIAGNOSIS — Z801 Family history of malignant neoplasm of trachea, bronchus and lung: Secondary | ICD-10-CM | POA: Diagnosis not present

## 2018-08-02 DIAGNOSIS — Z5111 Encounter for antineoplastic chemotherapy: Secondary | ICD-10-CM | POA: Diagnosis not present

## 2018-08-02 DIAGNOSIS — G4733 Obstructive sleep apnea (adult) (pediatric): Secondary | ICD-10-CM | POA: Insufficient documentation

## 2018-08-02 DIAGNOSIS — C7931 Secondary malignant neoplasm of brain: Secondary | ICD-10-CM | POA: Diagnosis not present

## 2018-08-02 DIAGNOSIS — Z832 Family history of diseases of the blood and blood-forming organs and certain disorders involving the immune mechanism: Secondary | ICD-10-CM | POA: Diagnosis not present

## 2018-08-02 DIAGNOSIS — Z880 Allergy status to penicillin: Secondary | ICD-10-CM | POA: Insufficient documentation

## 2018-08-02 DIAGNOSIS — Z8249 Family history of ischemic heart disease and other diseases of the circulatory system: Secondary | ICD-10-CM | POA: Insufficient documentation

## 2018-08-02 DIAGNOSIS — K589 Irritable bowel syndrome without diarrhea: Secondary | ICD-10-CM | POA: Insufficient documentation

## 2018-08-02 DIAGNOSIS — Z9981 Dependence on supplemental oxygen: Secondary | ICD-10-CM | POA: Diagnosis not present

## 2018-08-02 DIAGNOSIS — Z7951 Long term (current) use of inhaled steroids: Secondary | ICD-10-CM | POA: Insufficient documentation

## 2018-08-02 HISTORY — PX: IR IMAGING GUIDED PORT INSERTION: IMG5740

## 2018-08-02 MED ORDER — CLINDAMYCIN PHOSPHATE 900 MG/50ML IV SOLN
900.0000 mg | Freq: Once | INTRAVENOUS | Status: AC
  Administered 2018-08-02: 900 mg via INTRAVENOUS
  Filled 2018-08-02: qty 50

## 2018-08-02 MED ORDER — LIDOCAINE HCL (PF) 1 % IJ SOLN
INTRAMUSCULAR | Status: AC | PRN
  Administered 2018-08-02: 5 mL

## 2018-08-02 MED ORDER — HEPARIN SOD (PORK) LOCK FLUSH 100 UNIT/ML IV SOLN
INTRAVENOUS | Status: AC
  Filled 2018-08-02: qty 5

## 2018-08-02 MED ORDER — HEPARIN SOD (PORK) LOCK FLUSH 100 UNIT/ML IV SOLN
INTRAVENOUS | Status: AC | PRN
  Administered 2018-08-02: 500 [IU] via INTRAVENOUS

## 2018-08-02 MED ORDER — FENTANYL CITRATE (PF) 100 MCG/2ML IJ SOLN
INTRAMUSCULAR | Status: AC | PRN
  Administered 2018-08-02 (×2): 50 ug via INTRAVENOUS

## 2018-08-02 MED ORDER — SODIUM CHLORIDE 0.9 % IV SOLN
INTRAVENOUS | Status: DC
  Administered 2018-08-02: 11:00:00 via INTRAVENOUS

## 2018-08-02 MED ORDER — MIDAZOLAM HCL 2 MG/2ML IJ SOLN
INTRAMUSCULAR | Status: AC | PRN
  Administered 2018-08-02 (×2): 1 mg via INTRAVENOUS

## 2018-08-02 MED ORDER — LIDOCAINE-EPINEPHRINE 2 %-1:100000 IJ SOLN
INTRAMUSCULAR | Status: AC | PRN
  Administered 2018-08-02: 10 mL

## 2018-08-02 MED ORDER — MIDAZOLAM HCL 2 MG/2ML IJ SOLN
INTRAMUSCULAR | Status: AC
  Filled 2018-08-02: qty 4

## 2018-08-02 MED ORDER — GADOBUTROL 1 MMOL/ML IV SOLN
7.0000 mL | Freq: Once | INTRAVENOUS | Status: AC | PRN
  Administered 2018-08-02: 7 mL via INTRAVENOUS

## 2018-08-02 MED ORDER — LIDOCAINE-EPINEPHRINE (PF) 2 %-1:200000 IJ SOLN
INTRAMUSCULAR | Status: AC
  Filled 2018-08-02: qty 20

## 2018-08-02 MED ORDER — FENTANYL CITRATE (PF) 100 MCG/2ML IJ SOLN
INTRAMUSCULAR | Status: AC
  Filled 2018-08-02: qty 2

## 2018-08-02 NOTE — H&P (Signed)
Chief Complaint: Patient was seen in consultation today for lung cancer.  Referring Physician(s): Mohamed,Mohamed  Supervising Physician: Sandi Mariscal  Patient Status: [name]  History of Present Illness: [name] is a 62 y.o. female with past medical history of COPD, alpha-1 antitrypsin deficiency, pulmonary fibrosis on home O2, fibromyalgia, GERD, and IBS who was recently diagnosed with small cell lung cancer. She was also recently treated for pneumonia.  She has completed her antibiotic course and if improved today-- presenting without new cough, congestion, or shortness of breath.  IR consulted for Port-A-Cath placement at the request of Dr. Julien Nordmann.  She has been NPO.  She does not take blood thinners.   Past Medical History:  Diagnosis Date  . Alpha-1-antitrypsin deficiency ([place])   . Cervical cancer ([place]) 2004  . COPD mixed type ([place]) 09/14/2007   PFT- 05/06/2013-reduced FVC may reflect effort but the overall pattern is moderate obstructive airways disease with response to bronchodilator, air trapping, normal diffusion. This doesn't fit entirely with emphysema.      . Exposure to hepatitis C 09/12/2016  . Fibromyalgia   . GERD (gastroesophageal reflux disease)   . Hyperlipidemia   . IBS (irritable bowel syndrome)   . Obstructive sleep apnea 06/02/2008   NPSG 06/07/08, AHI 6.9/ hr, weight 220 lbs    . Osteoporosis   . Pulmonary fibrosis, unspecified ([place]) 09/29/2016   CXR 03/28/2016  . Rhinitis, nonallergic 05/08/2012  . 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 Vitamin D deficiency     Past Surgical History:  Procedure Laterality Date  . ABDOMINAL HYSTERECTOMY    . LUMBAR DISC SURGERY    . VIDEO BRONCHOSCOPY Bilateral 07/10/2018   Procedure: VIDEO BRONCHOSCOPY WITHOUT FLUORO;  Surgeon: Laurin Coder, MD;  Location: [facility];  Service: Cardiopulmonary;  Laterality: Bilateral;    Allergies: Penicillins; Actonel  [risedronate sodium]; Advair diskus [fluticasone-salmeterol]; Alendronate sodium; Aspirin; Azithromycin; Levofloxacin; Morphine; Nsaids; Omnicef [cefdinir]; Erythromycin; and Iodine  Medications: Prior to Admission medications   Medication Sig Start Date End Date Taking? Authorizing Provider  albuterol (PROVENTIL) (2.5 MG/3ML) 0.083% nebulizer solution Take 3 mLs (2.5 mg total) by nebulization every 6 (six) hours as needed. 08/01/18   [name], MD  allopurinol (ZYLOPRIM) 300 MG tablet Take 1 tablet (300 mg total) by mouth daily. Begin on 07/25/2018 07/24/18   Harle Stanford., PA-C  amitriptyline (ELAVIL) 75 MG tablet Take 75 mg by mouth at bedtime.      [provider]  benzonatate (TESSALON) 100 MG capsule Take 1 capsule (100 mg total) by mouth 3 (three) times daily as needed for cough. 07/15/18   Curt Bears, MD  BEVESPI AEROSPHERE 9-4.8 MCG/ACT AERO 2 PUFFS 2 TIMES A DAY Patient taking differently: Inhale 2 puffs into the lungs 2 (two) times daily.  07/15/18   Deneise Lever, MD  doxycycline (VIBRA-TABS) 100 MG tablet Take 1 tablet (100 mg total) by mouth 2 (two) times daily. 07/26/18   Harle Stanford., PA-C  ezetimibe (ZETIA) 10 MG tablet TAKE 1 TABLET ONCE A DAY 05/24/18   [name], MD  feeding supplement, ENSURE ENLIVE, (ENSURE ENLIVE) LIQD Take 237 mLs by mouth 2 (two) times daily between meals. 07/11/18   Mariel Aloe, MD  fluticasone (FLONASE) 50 MCG/ACT nasal spray USE 2 SPRAYS IN EACH NOSTRIL ONCE DAILY. 01/02/18   [name], MD  folic acid (FOLVITE) 1 MG tablet Take 1 mg  by mouth daily.    [provider]  HYDROcodone-acetaminophen (NORCO/VICODIN) 5-325 MG tablet Take 1 tablet by mouth 3 (three) times daily.    [provider]  Nebulizers (COMPRESSOR NEBULIZER) MISC 1 Device by Does not apply route as needed. 01/01/15   Deneise Lever, MD  omeprazole (PRILOSEC) 40 MG capsule TAKE (1) CAPSULE DAILY Patient taking differently: Take 40 mg by  mouth daily.  05/24/18   [name], MD  PROAIR HFA 108 [phone] Base) MCG/ACT inhaler 2 PUFFS EVERY 4 HOURS AS NEEDED FOR WHEEZING Patient taking differently: Inhale 2 puffs into the lungs every 4 (four) hours as needed for wheezing or shortness of breath.  02/21/16   [name], MD  prochlorperazine (COMPAZINE) 10 MG tablet Take 1 tablet (10 mg total) by mouth every 6 (six) hours as needed for nausea or vomiting. Patient not taking: Reported on 07/17/2018 07/15/18   Curt Bears, MD  rosuvastatin (CRESTOR) 40 MG tablet TAKE 1/2 TABLET ONCE DAILY Patient taking differently: Take 20 mg by mouth daily.  05/22/18   [name], MD     Family History  Problem Relation Age of Onset  . Alpha-1 antitrypsin deficiency Sister   . Osteoporosis Sister   . Pancreatic cancer Mother 45  . Osteoporosis Mother   . Irregular heart beat Mother   . Heart attack Father 32       Died age 78  . Migraines Father   . CAD Sister 43       Died age 4  . Osteoporosis Sister   . Lung cancer Brother   . CAD Brother 85       CABG.  Died from leukemia  . Leukemia Brother   . Heart attack Brother   . Alpha-1 antitrypsin deficiency Other   . Heart disease Brother   . Colon cancer Neg Hx     Social History   Socioeconomic History  . Marital status: Married    Spouse name: Audry Pili  . Number of children: 2  . Years of education: Not on file  . Highest education level: Not on file  Occupational History  . Occupation: disabled    Fish farm manager: UNEMPLOYED  Social Needs  . Financial resource strain: Not hard at all  . Food insecurity:    Worry: Never true    Inability: Never true  . Transportation needs:    Medical: No    Non-medical: No  Tobacco Use  . Smoking status: Former Smoker    Packs/day: 0.50    Types: Cigarettes    Start date: 11/20/1972    Last attempt to quit: 04/01/2012    Years since quitting: 6.3  . Smokeless tobacco: Never Used  Substance and Sexual Activity  . Alcohol use: No  .  Drug use: No  . Sexual activity: Yes  Lifestyle  . Physical activity:    Days per week: 5 days    Minutes per session: 30 min  . Stress: Not at all  Relationships  . Social connections:    Talks on phone: More than three times a week    Gets together: More than three times a week    Attends religious service: More than 4 times per year    Active member of club or organization: No    Attends meetings of clubs or organizations: Never    Relationship status: Not on file  Other Topics Concern  . Not on file  Social History Narrative   Lives at  home with husband.      Review of Systems: A 12 point ROS discussed and pertinent positives are indicated in the HPI above.  All other systems are negative.  Review of Systems  Constitutional: Negative for fatigue and fever.  Respiratory: Negative for cough and shortness of breath.   Cardiovascular: Negative for chest pain.  Gastrointestinal: Negative for abdominal pain, nausea and vomiting.  Genitourinary: Negative for dysuria, frequency and urgency.  Musculoskeletal: Negative for back pain.  Psychiatric/Behavioral: Negative for behavioral problems and confusion.    Vital Signs: There were no vitals taken for this visit.  Physical Exam  Constitutional: She is oriented to person, place, and time. She appears well-developed. No distress.  Neck: Normal range of motion. Neck supple. No tracheal deviation present.  Cardiovascular: Normal rate, regular rhythm and normal heart sounds. Exam reveals no gallop and no friction rub.  No murmur heard. Pulmonary/Chest: Effort normal and breath sounds normal. No respiratory distress.  Abdominal: Soft. There is no tenderness.  Lymphadenopathy:    She has no cervical adenopathy.  Neurological: She is alert and oriented to person, place, and time.  Skin: Skin is warm and dry. She is not diaphoretic.  Psychiatric: She has a normal mood and affect. Her behavior is normal. Judgment and thought content  normal.  Nursing note and vitals reviewed.    MD Evaluation Airway: WNL Heart: WNL Abdomen: WNL Chest/ Lungs: WNL ASA  Classification: 3 Mallampati/Airway Score: One   Imaging: Dg Chest 2 View  Result Date: 07/26/2018 CLINICAL DATA:  Lung cancer. Increased bilateral lower extremity edema. EXAM: CHEST - 2 VIEW COMPARISON:  Chest x-ray dated 07/17/2018. FINDINGS: Stable opacity at the LEFT lung base. Ill-defined opacity is now seen within the RIGHT upper lobe, corresponding to the ground-glass opacities better demonstrated on yesterday's PET-CT. Heart size appears stable. IMPRESSION: 1. Persistent opacity at the LEFT lung base, compatible with the combination of consolidation and pleural effusion better demonstrated on yesterday's PET-CT, the consolidation compatible with postobstructive atelectasis or pneumonia resulting caused by LEFT perihilar mass. 2. Subtle ill-defined opacities within the RIGHT upper lung, compatible with the ground-glass opacities on yesterday's PET-CT, most likely atypical pneumonia or asymmetric pulmonary edema. Differential includes atypical pneumonias such as viral or fungal, interstitial pneumonias, edema related to volume overload/CHF, chronic interstitial diseases, hypersensitivity pneumonitis, and respiratory bronchiolitis. Electronically Signed   By: Franki Cabot M.D.   On: 07/26/2018 15:18   Dg Chest 2 View  Result Date: 07/17/2018 CLINICAL DATA:  Fever, dyspnea, previous smoker and COPD. EXAM: CHEST - 2 VIEW COMPARISON:  07/16/2018 FINDINGS: Increased pulmonary consolidation and opacities at the left lung base since previous exam suggesting some interval progression of suspected pneumonia and/or atelectasis. Mild vascular congestion is also noted slightly increased in appearance since prior. Heart size is within normal limits. Mild aortic atherosclerosis without aneurysm. No aggressive osseous lesions. IMPRESSION: 1. There has been some interval progression of  airspace disease at the left lung base with a more consolidated appearance suggesting some worsening of pneumonia. 2. Pulmonary vascular congestion is also noted slightly more accentuated on prior exam. 3.  Aortic atherosclerosis (ICD10-I70.0) Electronically Signed   By: Ashley Royalty M.D.   On: 07/17/2018 17:02   Dg Chest 2 View  Result Date: 07/16/2018 CLINICAL DATA:  Elevated white blood cell count. Recently diagnosed with left-sided small cell lung malignancy. History of COPD, former smoker. EXAM: CHEST - 2 VIEW COMPARISON:  Portable chest x-ray of July 10, 2018. Chest CT scan of July 06, 2018. FINDINGS: There is progressive increased interstitial and airspace opacity in the left lower lobe. A small amount of increased density in the left upper lobe is stable since the previous CT scan. The right lung is mildly hyperinflated and clear. A small left pleural effusion is suspected. The heart and pulmonary vascularity are normal. There is calcification in the wall of the aortic arch. The bony thorax exhibits no acute abnormality. IMPRESSION: Progressive interstitial and airspace opacities in the left lower lobe worrisome for pneumonia. This may reflect a post obstructive process given the significant left-sided mediastinal lymphadenopathy described on the CT scan of July 06, 2018. A small amount of increased density in the left upper lobe may reflect atelectasis or pneumonia. Underlying COPD-smoking related changes. Thoracic aortic atherosclerosis. Electronically Signed   By: David  Martinique M.D.   On: 07/16/2018 09:31   Ct Chest W Contrast  Addendum Date: 07/06/2018   ADDENDUM REPORT: 07/06/2018 18:12 ADDENDUM: These results were called by telephone at the time of interpretation on 07/06/2018 at 5:20 pm to Dr. Nehemiah Settle, who verbally acknowledged these results. Electronically Signed   By: Fidela Salisbury M.D.   On: 07/06/2018 18:12   Result Date: 07/06/2018 CLINICAL DATA:  Elevated liver function  tests. EXAM: CT CHEST WITH CONTRAST TECHNIQUE: Multidetector CT imaging of the chest was performed during intravenous contrast administration. CONTRAST:  73mL OMNIPAQUE IOHEXOL 300 MG/ML  SOLN COMPARISON:  Abdominal series radiograph 07/06/2018 FINDINGS: Cardiovascular: The heart is normal in size. No pericardial effusion. Calcific atherosclerotic disease of the coronary arteries and aorta, mild. Mediastinum/Nodes: Left-sided mediastinal lymphadenopathy at all lymph node station. Index abnormal lymph node in the AP window measures 2.2 cm in short axis. Subcarinal lymphadenopathy is also present. In the left hilar/subhilar region there is a soft tissue mass versus conglomerate of abnormal lymph nodes which measures approximately 6.0 x 3.1 x 5.8 cm. This mass causes compression of the left lower lobar pulmonary arterial branches and left lower lobar main bronchus, with subsequent complete collapse of the left lower lobe. There is also mass effect to the lower esophagus. Lungs/Pleura: Post compressive atelectasis of the left lower lobe. Additionally, there are multiple areas of ground-glass opacity in bilateral lungs. An index lesion in the periphery of the right upper lobe measures 4.0 x 3.8 cm, image 42/155. Upper Abdomen: Diffusely heterogeneous nodular appearance of the liver with lobulated contours. 1.3 cm in short axis lymph node in the gastrohepatic legs. Other small lymph nodes in porta hepatis. Musculoskeletal: No chest wall abnormality. No acute or significant osseous findings. IMPRESSION: Predominantly left-sided mediastinal, hilar and subcarinal lymphadenopathy. A 6 cm left hilar/subhilar mass may represent a primary malignancy versus a conglomerate of abnormal lymph nodes. It causes compression of the left lower lobe pulmonary arterial branches, left lower lobe main bronchus and the lower esophagus. Post compressive complete collapse of the left lower lobe. Multiple areas of ground-glass opacity in  bilateral lungs. The index lesion in the periphery of the right upper lobe measures 4 cm. These may represent infectious or inflammatory consolidation, however bronchoalveolar carcinoma cannot be excluded. Diffusely heterogeneous nodular appearance of the liver. This may represent changes of cirrhosis or alternatively diffuse hepatic metastatic disease. Moderate lymphadenopathy in the upper abdomen. Bronchoscopy with tissue sampling may be considered to arrive to a histologic diagnosis. Aortic Atherosclerosis (ICD10-I70.0). Electronically Signed: By: Fidela Salisbury M.D. On: 07/06/2018 17:12   Nm Pet Image Initial (pi) Skull Base To Thigh  Result Date: 07/25/2018 CLINICAL DATA:  Initial treatment strategy  for lung cancer. EXAM: NUCLEAR MEDICINE PET SKULL BASE TO THIGH TECHNIQUE: 8.58 mCi F-18 FDG was injected intravenously. Full-ring PET imaging was performed from the skull base to thigh after the radiotracer. CT data was obtained and used for attenuation correction and anatomic localization. Fasting blood glucose: 96 mg/dl COMPARISON:  CT chest 07/06/2018 FINDINGS: Mediastinal blood pool activity: SUV max 2.14 NECK: No hypermetabolic lymph nodes in the neck. Incidental CT findings: none CHEST: The left lower lobe perihilar lung mass with mediastinal invasion is again noted. This has a maximum diameter of approximately 5.3 cm within SUV max of 10.79. Complete obstruction of the left lower lobe bronchus with extensive postobstructive consolidation of the entire left lower lobe noted. Adjacent left hilar lymph node is hypermetabolic with an SUV max of 9.48. Enlarged and hypermetabolic left pre-vascular lymph node measures 2.2 cm and has an SUV max of 7.93. Enlarged left paratracheal lymph node measures 1.4 cm and has an SUV max of 3.14. No hypermetabolic subcarinal or right hilar lymph nodes. No hypermetabolic axillary or supraclavicular nodes. Scattered multifocal ill-defined areas of ground-glass attenuation  are noted predominantly involving the right lung. These have a somewhat different configuration than on study from 07/06/2018 favoring inflammatory/infectious process. The dominant subpleural ground-glass attenuating area within the lateral right upper lobe is smaller on today's study compatible with resolving inflammation/infection. Incidental CT findings: Aortic atherosclerosis. Calcification in the LAD and RCA coronary arteries noted. ABDOMEN/PELVIS: Within a background of probable early cirrhotic changes. Lesions are difficult to identified on the unenhanced CT images. Within the lateral segment of left lobe of liver there is a lesion with SUV max of 5.53 measuring approximately 2.2 cm. Segment 4 lesion measures approximately 2.1 cm and has an SUV max of 7.37. Segment 8 lesion measures approximately 1.7 cm and has an SUV max of 5.28. No abnormal uptake within the pancreas or spleen. No abnormal uptake within the adrenal glands. Hypermetabolic porta hepatic lymph nodes are identified. Index node measures 1.5 cm and has an SUV max of 7.68. Status post bilateral pelvic nodal dissection. Incidental CT findings: Aortic atherosclerosis identified. There is diffuse hepatic steatosis with enlargement of the lateral segment of left lobe and caudate lobe of liver. The contour of the liver is diffusely nodular. A small volume of ascites is identified within the pelvis. Status post hysterectomy. SKELETON: There is a suspicious focus of mild increased uptake within the proximal right femur within SUV max of 2.4. On the corresponding CT images there is a well-circumscribed soft tissue lesion within the bone marrow measuring 1.7 cm. Solitary focus of increased uptake localizing to the lateral aspect of the left ninth rib has an SUV max of 4.47. Underlying posttraumatic deformity is noted. Incidental CT findings: none IMPRESSION: 1. Large intensely hypermetabolic left perihilar lung mass is identified invading the mediastinum  and obstructing the left lower lobe bronchus. Findings compatible with primary bronchogenic carcinoma. Hypermetabolic left hilar and left mediastinal lymph nodes are identified. Additionally, there are multifocal areas of increased uptake within the liver suspicious for metastatic disease. At least 1 focus of increased uptake is identified within the proximal right femur which is concerning for bone metastases. 2. Multiple enlarged and hypermetabolic upper abdominal lymph nodes. Suspicious for nodal metastasis. 3. Morphologic features of liver compatible with cirrhosis. In a patient who is at increased risk for hepatoma consider further characterization of hypermetabolic liver lesions with contrast enhanced liver protocol MRI. 4. Multiple patchy areas of ground-glass attenuation within the right lung exhibit a different  morphology when compared with 07/06/2018 and are favored to represent sequelae of inflammatory or infectious process. 5.  Aortic Atherosclerosis (ICD10-I70.0). Electronically Signed   By: Kerby Moors M.D.   On: 07/25/2018 17:45   Dg Chest Port 1 View  Result Date: 07/10/2018 CLINICAL DATA:  Post bronchoscopy and left lung biopsy. Now with increased cough. EXAM: PORTABLE CHEST 1 VIEW COMPARISON:  Portable chest x-ray of July 08, 2018 FINDINGS: The right lung is well-expanded. Coarse lung markings in the right upper lobe persists. On the left there is patchy density at the left lung base which is stable. The interstitial markings are slightly more in the left lung. There is no pneumothorax, pneumomediastinum, or pleural effusion. The heart and pulmonary vascularity are normal. IMPRESSION: There is no postprocedure complication following bronchoscopy on the left with lung biopsies. Electronically Signed   By: David  Martinique M.D.   On: 07/10/2018 12:45   Dg Chest Port 1 View  Result Date: 07/08/2018 CLINICAL DATA:  Rhonchi. EXAM: PORTABLE CHEST 1 VIEW COMPARISON:  None. FINDINGS: The heart  size and mediastinal contours are within normal limits. No pneumothorax or pleural effusion is noted. Right lung is clear. Mild left basilar subsegmental atelectasis is noted. The visualized skeletal structures are unremarkable. IMPRESSION: Mild left basilar subsegmental atelectasis. Electronically Signed   By: Marijo Conception, M.D.   On: 07/08/2018 07:22   Dg Abd Acute W/chest  Result Date: 07/06/2018 CLINICAL DATA:  Nodule vomiting 2 months. EXAM: DG ABDOMEN ACUTE W/ 1V CHEST COMPARISON:  Chest x-ray 06/12/2017 FINDINGS: Lungs are adequately inflated with 1 cm nodule opacity over the lateral left midlung. Linear scarring lateral left base. Subtle hazy density over the right upper lobe. No effusion. Cardiac silhouette is within normal. Mild increased density over the AP window. Bowel gas pattern is nonobstructive. No free peritoneal air. Multiple surgical clips over the pelvis. Degenerate change of the spine and hips. Interbody fusion of L4-5. IMPRESSION: Subtle hazy density over the right upper lobe which may be due to acute infection. 1 cm nodular opacity over the lateral left midlung. Increased density and prominence of the AP window as cannot exclude mass/adenopathy. Consider contrast-enhanced chest CT for further evaluation. Nonobstructive bowel gas pattern. Electronically Signed   By: Marin Olp M.D.   On: 07/06/2018 14:43   US Abdomen Limited Ruq  Result Date: 07/19/2018 CLINICAL DATA:  Abnormal LFTs, history COPD, pulmonary fibrosis, cervical cancer, hyperlipidemia, GERD, irritable bowel syndrome EXAM: ULTRASOUND ABDOMEN LIMITED RIGHT UPPER QUADRANT COMPARISON:  CT abdomen and pelvis 07/17/2013 FINDINGS: Gallbladder: Normally distended without stones or wall thickening. No pericholecystic fluid or sonographic Murphy sign. Common bile duct: Diameter: 2 mm diameter, normal Liver: Heterogeneous echogenicity. Multiple hepatic nodules are identified, ranging from hypoechoic to mildly hyperechoic and  measuring up to 2.1 cm in size. Lesions are suspicious for hepatic metastatic disease and are new since the prior CT. Portal vein is patent on color Doppler imaging with normal direction of blood flow towards the liver. No RIGHT upper quadrant free fluid. Visualized portion of pancreas normal appearance. IMPRESSION: Heterogeneous hepatic echogenicity with multiple small hepatic nodules suspicious for metastatic disease; further evaluation by CT imaging with IV and oral contrast recommended. Electronically Signed   By: Lavonia Dana M.D.   On: 07/19/2018 08:16    Labs:  CBC: Recent Labs    07/18/18 0345 07/19/18 0355 07/24/18 0753 07/30/18 0816  WBC 16.4* 14.3* 12.1* 6.6  HGB 11.6* 11.7* 12.6 11.6  HCT 33.9* 35.2* 38.1 34.3*  PLT 503* 473* 429* 324    COAGS: Recent Labs    07/06/18 1310 07/10/18 0605  INR 1.03 0.99    BMP: Recent Labs    07/18/18 0345 07/19/18 0355 07/24/18 0753 07/30/18 0816  NA 132* 132* 132* 130*  K 3.1* 3.8 3.9 3.7  CL 93* 95* 95* 91*  CO2 29 28 28  32  GLUCOSE 116* 98 108* 98  BUN 10 9 6 10   CALCIUM 8.3* 8.3* 9.0 8.6*  CREATININE 0.48 0.41* 0.57 0.54  GFRNONAA >60 >60 >60 >60  GFRAA >60 >60 >60 >60    LIVER FUNCTION TESTS: Recent Labs    07/18/18 0345 07/19/18 0355 07/24/18 0753 07/30/18 0816  BILITOT 1.0 0.6 0.6 0.6  AST 112* 140* 284* 124*  ALT 41 51* 100* 67*  ALKPHOS 313* 334* 671* 410*  PROT 5.8* 5.9* 6.7 6.3*  ALBUMIN 2.1* 1.9* 1.9* 2.1*    TUMOR MARKERS: No results for input(s): AFPTM, CEA, CA199, CHROMGRNA in the last 8760 hours.  Assessment and Plan: Patient with past medical history significant for pulmonary disease including COPD, pulmonary fibrosis on home O2 recently found to have small cell lung cancer of the left lung who presents to radiology today for Port-A-Cath placement.  She denies plans for radiation at this time- chemotherapy only for now.  She recently had a CBC (9/10) which showed a normal WBC post treatment  for pneumonia.  Her most recent INR from 8/21 was 0.99 and she is not currently on blood thinners.  Case reviewed by Dr. Pascal Lux who approves patient for procedure.   Risks and benefits of image guided port-a-catheter placement was discussed with the patient including, but not limited to bleeding, infection, pneumothorax, or fibrin sheath development and need for additional procedures.  All of the patient's questions were answered, patient is agreeable to proceed. Consent signed and in chart.   Thank you for this interesting consult.  I greatly enjoyed meeting SACHA TOPOR and look forward to participating in their care.  A copy of this report was sent to the requesting provider on this date.  Electronically Signed: Docia Barrier, PA 08/02/2018, 10:45 AM   I spent a total of  30 Minutes   in face to face in clinical consultation, greater than 50% of which was counseling/coordinating care for lung cancer.

## 2018-08-02 NOTE — Discharge Instructions (Signed)
Please keep your dressing on for 24 hours. After 24 hours you may shower.   Do not use EMLA cream until all steri-strips and glue have come off on their own.    Implanted Port Insertion, Care After This sheet gives you information about how to care for yourself after your procedure. Your health care provider may also give you more specific instructions. If you have problems or questions, contact your health care provider. What can I expect after the procedure? After your procedure, it is common to have:  Discomfort at the port insertion site.  Bruising on the skin over the port. This should improve over 3-4 days.  Follow these instructions at home: [facility] care  After your port is placed, you will get a manufacturer's information card. The card has information about your port. Keep this card with you at all times.  Take care of the port as told by your health care provider. Ask your health care provider if you or a family member can get training for taking care of the port at home. A home health care nurse may also take care of the port.  Make sure to remember what type of port you have. Incision care  Follow instructions from your health care provider about how to take care of your port insertion site. Make sure you: ? Wash your hands with soap and water before you change your bandage (dressing). If soap and water are not available, use hand sanitizer. ? Change your dressing as told by your health care provider. ? Leave stitches (sutures), skin glue, or adhesive strips in place. These skin closures may need to stay in place for 2 weeks or longer. If adhesive strip edges start to loosen and curl up, you may trim the loose edges. Do not remove adhesive strips completely unless your health care provider tells you to do that.  Check your port insertion site every day for signs of infection. Check for: ? More redness, swelling, or pain. ? More fluid or blood. ? Warmth. ? Pus or a bad  smell. General instructions  Do not take baths, swim, or use a hot tub until your health care provider approves.  Do not lift anything that is heavier than 10 lb (4.5 kg) for a week, or as told by your health care provider.  Ask your health care provider when it is okay to: ? Return to work or school. ? Resume usual physical activities or sports.  Do not drive for 24 hours if you were given a medicine to help you relax (sedative).  Take over-the-counter and prescription medicines only as told by your health care provider.  Wear a medical alert bracelet in case of an emergency. This will tell any health care providers that you have a port.  Keep all follow-up visits as told by your health care provider. This is important. Contact a health care provider if:  You cannot flush your port with saline as directed, or you cannot draw blood from the port.  You have a fever or chills.  You have more redness, swelling, or pain around your port insertion site.  You have more fluid or blood coming from your port insertion site.  Your port insertion site feels warm to the touch.  You have pus or a bad smell coming from the port insertion site. Get help right away if:  You have chest pain or shortness of breath.  You have bleeding from your port that you cannot control. Summary  Take care of the port as told by your health care provider.  Change your dressing as told by your health care provider.  Keep all follow-up visits as told by your health care provider. This information is not intended to replace advice given to you by your health care provider. Make sure you discuss any questions you have with your health care provider. Document Released: 08/27/2013 Document Revised: 09/27/2016 Document Reviewed: 09/27/2016 Elsevier Interactive Patient Education  2017 [place].  Moderate Conscious Sedation, Adult, Care After These instructions provide you with information about caring  for yourself after your procedure. Your health care provider may also give you more specific instructions. Your treatment has been planned according to current medical practices, but problems sometimes occur. Call your health care provider if you have any problems or questions after your procedure. What can I expect after the procedure? After your procedure, it is common:  To feel sleepy for several hours.  To feel clumsy and have poor balance for several hours.  To have poor judgment for several hours.  To vomit if you eat too soon.  Follow these instructions at home: For at least 24 hours after the procedure:   Do not: ? Participate in activities where you could fall or become injured. ? Drive. ? Use heavy machinery. ? Drink alcohol. ? Take sleeping pills or medicines that cause drowsiness. ? Make important decisions or sign legal documents. ? Take care of children on your own.  Rest. Eating and drinking  Follow the diet recommended by your health care provider.  If you vomit: ? Drink water, juice, or soup when you can drink without vomiting. ? Make sure you have little or no nausea before eating solid foods. General instructions  Have a responsible adult stay with you until you are awake and alert.  Take over-the-counter and prescription medicines only as told by your health care provider.  If you smoke, do not smoke without supervision.  Keep all follow-up visits as told by your health care provider. This is important. Contact a health care provider if:  You keep feeling nauseous or you keep vomiting.  You feel light-headed.  You develop a rash.  You have a fever. Get help right away if:  You have trouble breathing. This information is not intended to replace advice given to you by your health care provider. Make sure you discuss any questions you have with your health care provider. Document Released: 08/27/2013 Document Revised: 04/10/2016 Document  Reviewed: 02/26/2016 Elsevier Interactive Patient Education  [employer].

## 2018-08-02 NOTE — Procedures (Signed)
Pre Procedure Dx: Lung Cancer Post Procedural Dx: Same  Successful placement of right IJ approach port-a-cath with tip at the superior caval atrial junction. The catheter is ready for immediate use.  Estimated Blood Loss: Minimal  Complications: None immediate.  Jay Kylea Berrong, MD Pager #: [phone]   

## 2018-08-05 ENCOUNTER — Inpatient Hospital Stay: Payer: Medicare Other

## 2018-08-05 DIAGNOSIS — Z87891 Personal history of nicotine dependence: Secondary | ICD-10-CM | POA: Diagnosis not present

## 2018-08-05 DIAGNOSIS — C7951 Secondary malignant neoplasm of bone: Secondary | ICD-10-CM | POA: Diagnosis not present

## 2018-08-05 DIAGNOSIS — E785 Hyperlipidemia, unspecified: Secondary | ICD-10-CM | POA: Diagnosis not present

## 2018-08-05 DIAGNOSIS — C3412 Malignant neoplasm of upper lobe, left bronchus or lung: Secondary | ICD-10-CM | POA: Diagnosis not present

## 2018-08-05 DIAGNOSIS — M797 Fibromyalgia: Secondary | ICD-10-CM | POA: Diagnosis not present

## 2018-08-05 DIAGNOSIS — C787 Secondary malignant neoplasm of liver and intrahepatic bile duct: Secondary | ICD-10-CM | POA: Diagnosis not present

## 2018-08-05 DIAGNOSIS — K219 Gastro-esophageal reflux disease without esophagitis: Secondary | ICD-10-CM | POA: Diagnosis not present

## 2018-08-05 DIAGNOSIS — R590 Localized enlarged lymph nodes: Secondary | ICD-10-CM | POA: Diagnosis not present

## 2018-08-05 DIAGNOSIS — Z79899 Other long term (current) drug therapy: Secondary | ICD-10-CM | POA: Diagnosis not present

## 2018-08-05 DIAGNOSIS — J449 Chronic obstructive pulmonary disease, unspecified: Secondary | ICD-10-CM | POA: Diagnosis not present

## 2018-08-05 DIAGNOSIS — E559 Vitamin D deficiency, unspecified: Secondary | ICD-10-CM | POA: Diagnosis not present

## 2018-08-05 DIAGNOSIS — C3492 Malignant neoplasm of unspecified part of left bronchus or lung: Secondary | ICD-10-CM

## 2018-08-05 DIAGNOSIS — C3411 Malignant neoplasm of upper lobe, right bronchus or lung: Secondary | ICD-10-CM | POA: Diagnosis not present

## 2018-08-05 DIAGNOSIS — Z7689 Persons encountering health services in other specified circumstances: Secondary | ICD-10-CM | POA: Diagnosis not present

## 2018-08-05 DIAGNOSIS — Z5111 Encounter for antineoplastic chemotherapy: Secondary | ICD-10-CM | POA: Diagnosis not present

## 2018-08-05 DIAGNOSIS — R600 Localized edema: Secondary | ICD-10-CM | POA: Diagnosis not present

## 2018-08-05 DIAGNOSIS — K589 Irritable bowel syndrome without diarrhea: Secondary | ICD-10-CM | POA: Diagnosis not present

## 2018-08-05 LAB — CBC WITH DIFFERENTIAL (CANCER CENTER ONLY)
BASOS ABS: 0.1 10*3/uL (ref 0.0–0.1)
BASOS PCT: 1 %
Eosinophils Absolute: 0.1 10*3/uL (ref 0.0–0.5)
Eosinophils Relative: 1 %
HEMATOCRIT: 36 % (ref 34.8–46.6)
HEMOGLOBIN: 12 g/dL (ref 11.6–15.9)
Lymphocytes Relative: 20 %
Lymphs Abs: 2.3 10*3/uL (ref 0.9–3.3)
MCH: 28.5 pg (ref 25.1–34.0)
MCHC: 33.3 g/dL (ref 31.5–36.0)
MCV: 85.8 fL (ref 79.5–101.0)
Monocytes Absolute: 1.6 10*3/uL — ABNORMAL HIGH (ref 0.1–0.9)
Monocytes Relative: 14 %
NEUTROS ABS: 7.6 10*3/uL — AB (ref 1.5–6.5)
NEUTROS PCT: 64 %
Platelet Count: 158 10*3/uL (ref 145–400)
RBC: 4.2 MIL/uL (ref 3.70–5.45)
RDW: 17.5 % — ABNORMAL HIGH (ref 11.2–14.5)
WBC: 11.8 10*3/uL — AB (ref 3.9–10.3)

## 2018-08-05 LAB — CMP (CANCER CENTER ONLY)
ALK PHOS: 486 U/L — AB (ref 38–126)
ALT: 69 U/L — ABNORMAL HIGH (ref 0–44)
ANION GAP: 9 (ref 5–15)
AST: 123 U/L — ABNORMAL HIGH (ref 15–41)
Albumin: 2.6 g/dL — ABNORMAL LOW (ref 3.5–5.0)
BILIRUBIN TOTAL: 0.3 mg/dL (ref 0.3–1.2)
BUN: 5 mg/dL — ABNORMAL LOW (ref 6–20)
CALCIUM: 9.2 mg/dL (ref 8.9–10.3)
CO2: 30 mmol/L (ref 22–32)
Chloride: 95 mmol/L — ABNORMAL LOW (ref 98–111)
Creatinine: 0.55 mg/dL (ref 0.44–1.00)
Glucose, Bld: 101 mg/dL — ABNORMAL HIGH (ref 70–99)
Potassium: 4 mmol/L (ref 3.5–5.1)
SODIUM: 134 mmol/L — AB (ref 135–145)
Total Protein: 6.7 g/dL (ref 6.5–8.1)

## 2018-08-07 LAB — FUNGUS CULTURE WITH STAIN

## 2018-08-07 LAB — FUNGUS CULTURE RESULT

## 2018-08-07 LAB — FUNGAL ORGANISM REFLEX

## 2018-08-09 ENCOUNTER — Telehealth: Payer: Self-pay | Admitting: Internal Medicine

## 2018-08-09 NOTE — Assessment & Plan Note (Addendum)
This is a very pleasant 62 year old white female recently diagnosed with extensive stage small cell lung cancer.  The patient had a recent PET scan that showed multiple metastatic disease in the lung, liver as well as bone. She is currently on systemic chemotherapy with carboplatin, etoposide and Tecentriq status post 1 cycle which she tolerated fairly well with no concerning complaints. She had a recent MRI of the brain and is here to discuss the results.  The patient was seen with Dr. Julien Nordmann.  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  Recommend for her to proceed with cycle #2 of her chemotherapy today as scheduled.  She will continue to have weekly labs.  She will have a restaging CT scan of the chest, abdomen, pelvis prior to her next visit.  She will follow-up in 3 weeks for evaluation prior to cycle #3 of her treatment and to review her restaging CT scan results.  She was advised to call immediately if she has any concerning symptoms in the interval. The patient voices understanding of current disease status and treatment options and is in agreement with the current care plan.  All questions were answered. The patient knows to call the clinic with any problems, questions or concerns. We can certainly see the patient much sooner if necessary.

## 2018-08-09 NOTE — Progress Notes (Signed)
[facility] OFFICE PROGRESS NOTE  [name], MD [place] [address]  DIAGNOSIS: Extensive stage (T3, N2, M1a)small cell lung cancer diagnosed in August 2019 and presented with large left hilar/subhilar mass in addition to mediastinal lymphadenopathy and contracture and mass in the right upper lobe.  PRIOR THERAPY:None  CURRENT THERAPY: Therapy with carboplatin for AUC of 5 on day 1, etoposide 100 mg/M2 on days 1, 2 and 3 as well as Tecentriq (Atezolizumab) 1200 mg IV every 3 weeks with Neulasta support.  Status post 1 cycle.  INTERVAL HISTORY: [name] 62 y.o. female returns for a routine follow-up visit accompanied by her husband.  The patient is feeling fine today and has no specific complaints.  She tolerated her first cycle of chemotherapy well overall.  She denies fevers and chills.  Denies chest pain, cough, hemoptysis.  She has her baseline shortness of breath and wears home oxygen.  Denies nausea, vomiting, constipation, diarrhea.  Denies recent weight loss or night sweats.  The patient had an MRI of the brain recently and is here to discuss the results and also for evaluation prior to cycle #2 of her chemotherapy.  MEDICAL HISTORY: Past Medical History:  Diagnosis Date  . Alpha-1-antitrypsin deficiency ([place])   . Cervical cancer ([place]) 2004  . 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 This doesn't fit entirely with emphysema.      . Exposure to hepatitis C 09/12/2016  . Fibromyalgia   . GERD (gastroesophageal reflux disease)   . Hyperlipidemia   . IBS (irritable bowel syndrome)   . Obstructive sleep apnea 06/02/2008   NPSG 06/07/08, AHI 6.9/ hr, weight 220 lbs    . Osteoporosis   . Pulmonary fibrosis, unspecified ([place]) 09/29/2016   CXR 03/28/2016  . Rhinitis,  nonallergic 05/08/2012  . 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 Vitamin D deficiency     ALLERGIES:  is allergic to penicillins; actonel [risedronate sodium]; advair diskus [fluticasone-salmeterol]; alendronate sodium; aspirin; azithromycin; levofloxacin; morphine; nsaids; omnicef [cefdinir]; erythromycin; and iodine.  MEDICATIONS:  Current Outpatient Medications  Medication Sig Dispense Refill  . albuterol (PROVENTIL) (2.5 MG/3ML) 0.083% nebulizer solution Take 3 mLs (2.5 mg total) by nebulization every 6 (six) hours as needed. 90 mL 0  . allopurinol (ZYLOPRIM) 300 MG tablet Take 1 tablet (300 mg total) by mouth daily. Begin on 07/25/2018 14 tablet 0  . amitriptyline (ELAVIL) 75 MG tablet Take 75 mg by mouth at bedtime.      . benzonatate (TESSALON) 100 MG capsule Take 1 capsule (100 mg total) by mouth 3 (three) times daily as needed for cough. 20 capsule 0  . BEVESPI AEROSPHERE 9-4.8 MCG/ACT AERO 2 PUFFS 2 TIMES A DAY (Patient taking differently: Inhale 2 puffs into the lungs 2 (two) times daily. ) 10.7 g 0  . doxycycline (VIBRA-TABS) 100 MG tablet Take 1 tablet (100 mg total) by mouth 2 (two) times daily. 14 tablet 0  . ezetimibe (ZETIA) 10 MG tablet TAKE 1 TABLET ONCE A DAY 90 tablet 1  . feeding supplement, ENSURE ENLIVE, (ENSURE ENLIVE) LIQD Take 237 mLs by mouth 2 (two) times daily between meals. 60 Bottle 0  . fluticasone (FLONASE) 50 MCG/ACT nasal spray USE 2 SPRAYS IN EACH NOSTRIL ONCE DAILY. 16 g 5  .  folic acid (FOLVITE) 1 MG tablet Take 1 mg by mouth daily.    Marland Kitchen HYDROcodone-acetaminophen (NORCO/VICODIN) 5-325 MG tablet Take 1 tablet by mouth 3 (three) times daily.    . Nebulizers (COMPRESSOR NEBULIZER) MISC 1 Device by Does not apply route as needed. 1 each prn  . omeprazole (PRILOSEC) 40 MG capsule TAKE (1) CAPSULE DAILY (Patient taking differently: Take 40 mg by mouth daily. ) 90 capsule 1  . PROAIR HFA 108 (90 Base) MCG/ACT inhaler 2 PUFFS  EVERY 4 HOURS AS NEEDED FOR WHEEZING (Patient taking differently: Inhale 2 puffs into the lungs every 4 (four) hours as needed for wheezing or shortness of breath. ) 8.5 g 2  . prochlorperazine (COMPAZINE) 10 MG tablet Take 1 tablet (10 mg total) by mouth every 6 (six) hours as needed for nausea or vomiting. 30 tablet 0  . rosuvastatin (CRESTOR) 40 MG tablet TAKE 1/2 TABLET ONCE DAILY (Patient taking differently: Take 20 mg by mouth daily. ) 45 tablet 1  . lidocaine-prilocaine (EMLA) cream Apply 1 application topically as needed. 30 g 0   No current facility-administered medications for this visit.    Facility-Administered Medications Ordered in Other Visits  Medication Dose Route Frequency Provider Last Rate Last Dose  . atezolizumab (TECENTRIQ) 1,200 mg in sodium chloride 0.9 % 250 mL chemo infusion  1,200 mg Intravenous Once Curt Bears, MD      . CARBOplatin (PARAPLATIN) 570 mg in sodium chloride 0.9 % 250 mL chemo infusion  570 mg Intravenous Once Curt Bears, MD      . etoposide (VEPESID) 190 mg in sodium chloride 0.9 % 500 mL chemo infusion  100 mg/m2 (Treatment Plan Recorded) Intravenous Once Curt Bears, MD        SURGICAL HISTORY:  Past Surgical History:  Procedure Laterality Date  . ABDOMINAL HYSTERECTOMY    . IR IMAGING GUIDED PORT INSERTION  08/02/2018  . LUMBAR DISC SURGERY    . VIDEO BRONCHOSCOPY Bilateral 07/10/2018   Procedure: VIDEO BRONCHOSCOPY WITHOUT FLUORO;  Surgeon: Laurin Coder, MD;  Location: [facility];  Service: Cardiopulmonary;  Laterality: Bilateral;    REVIEW OF SYSTEMS:   Review of Systems  Constitutional: Negative for appetite change, chills, fatigue, fever and unexpected weight change.  HENT:   Negative for mouth sores, nosebleeds, sore throat and trouble swallowing.   Eyes: Negative for eye problems and icterus.  Respiratory: Negative for cough, hemoptysis, and wheezing.  She has her baseline shortness of breath and wears home  oxygen. Cardiovascular: Negative for chest pain and leg swelling.  Gastrointestinal: Negative for abdominal pain, constipation, diarrhea, nausea and vomiting.  Genitourinary: Negative for bladder incontinence, difficulty urinating, dysuria, frequency and hematuria.   Musculoskeletal: Negative for back pain, gait problem, neck pain and neck stiffness.  Skin: Negative for itching and rash.  Neurological: Negative for dizziness, extremity weakness, gait problem, headaches, light-headedness and seizures.  Hematological: Negative for adenopathy. Does not bruise/bleed easily.  Psychiatric/Behavioral: Negative for confusion, depression and sleep disturbance. The patient is not nervous/anxious.     PHYSICAL EXAMINATION:  Blood pressure 139/82, pulse 91, temperature 97.8 F (36.6 C), temperature source Oral, resp. rate 18, height _0  (1.651 m), weight 171 lb 12.8 oz (77.9 kg), SpO2 96 %.  ECOG PERFORMANCE STATUS: 1 - Symptomatic but completely ambulatory  Physical Exam  Constitutional: Oriented to person, place, and time and well-developed, well-nourished, and in no distress. No distress.  HENT:  Head: Normocephalic and atraumatic.  Mouth/Throat: Oropharynx is clear and moist. No  oropharyngeal exudate.  Eyes: Conjunctivae are normal. Right eye exhibits no discharge. Left eye exhibits no discharge. No scleral icterus.  Neck: Normal range of motion. Neck supple.  Cardiovascular: Normal rate, regular rhythm, normal heart sounds and intact distal pulses.   Pulmonary/Chest: Effort normal. No respiratory distress. No wheezes. No rales.  Diminished breath sounds bilaterally. Abdominal: Soft. Bowel sounds are normal. Exhibits no distension and no mass. There is no tenderness.  Musculoskeletal: Normal range of motion. Exhibits no edema.  Lymphadenopathy:    No cervical adenopathy.  Neurological: Alert and oriented to person, place, and time. Exhibits normal muscle tone. Gait normal. Coordination  normal.  Skin: Skin is warm and dry. No rash noted. Not diaphoretic. No erythema. No pallor.  Psychiatric: Mood, memory and judgment normal.  Vitals reviewed.  LABORATORY DATA: Lab Results  Component Value Date   WBC 11.8 (H) 08/05/2018   HGB 12.0 08/05/2018   HCT 36.0 08/05/2018   MCV 85.8 08/05/2018   PLT 158 08/05/2018      Chemistry      Component Value Date/Time   NA 134 (L) 08/05/2018 0816   NA 124 (L) 07/16/2018 0853   K 4.0 08/05/2018 0816   CL 95 (L) 08/05/2018 0816   CO2 30 08/05/2018 0816   BUN 5 (L) 08/05/2018 0816   BUN 8 07/16/2018 0853   CREATININE 0.55 08/05/2018 0816   CREATININE 0.68 02/13/2013 1523      Component Value Date/Time   CALCIUM 9.2 08/05/2018 0816   ALKPHOS 486 (H) 08/05/2018 0816   AST 123 (H) 08/05/2018 0816   ALT 69 (H) 08/05/2018 0816   BILITOT 0.3 08/05/2018 0816       RADIOGRAPHIC STUDIES:  Dg Chest 2 View  Result Date: 07/26/2018 CLINICAL DATA:  Lung cancer. Increased bilateral lower extremity edema. EXAM: CHEST - 2 VIEW COMPARISON:  Chest x-ray dated 07/17/2018. FINDINGS: Stable opacity at the LEFT lung base. Ill-defined opacity is now seen within the RIGHT upper lobe, corresponding to the ground-glass opacities better demonstrated on yesterday's PET-CT. Heart size appears stable. IMPRESSION: 1. Persistent opacity at the LEFT lung base, compatible with the combination of consolidation and pleural effusion better demonstrated on yesterday's PET-CT, the consolidation compatible with postobstructive atelectasis or pneumonia resulting caused by LEFT perihilar mass. 2. Subtle ill-defined opacities within the RIGHT upper lung, compatible with the ground-glass opacities on yesterday's PET-CT, most likely atypical pneumonia or asymmetric pulmonary edema. Differential includes atypical pneumonias such as viral or fungal, interstitial pneumonias, edema related to volume overload/CHF, chronic interstitial diseases, hypersensitivity pneumonitis, and  respiratory bronchiolitis. Electronically Signed   By: Franki Cabot M.D.   On: 07/26/2018 15:18   Dg Chest 2 View  Result Date: 07/17/2018 CLINICAL DATA:  Fever, dyspnea, previous smoker and COPD. EXAM: CHEST - 2 VIEW COMPARISON:  07/16/2018 FINDINGS: Increased pulmonary consolidation and opacities at the left lung base since previous exam suggesting some interval progression of suspected pneumonia and/or atelectasis. Mild vascular congestion is also noted slightly increased in appearance since prior. Heart size is within normal limits. Mild aortic atherosclerosis without aneurysm. No aggressive osseous lesions. IMPRESSION: 1. There has been some interval progression of airspace disease at the left lung base with a more consolidated appearance suggesting some worsening of pneumonia. 2. Pulmonary vascular congestion is also noted slightly more accentuated on prior exam. 3.  Aortic atherosclerosis (ICD10-I70.0) Electronically Signed   By: Ashley Royalty M.D.   On: 07/17/2018 17:02   Dg Chest 2 View  Result Date: 07/16/2018  CLINICAL DATA:  Elevated white blood cell count. Recently diagnosed with left-sided small cell lung malignancy. History of COPD, former smoker. EXAM: CHEST - 2 VIEW COMPARISON:  Portable chest x-ray of July 10, 2018. Chest CT scan of July 06, 2018. FINDINGS: There is progressive increased interstitial and airspace opacity in the left lower lobe. A small amount of increased density in the left upper lobe is stable since the previous CT scan. The right lung is mildly hyperinflated and clear. A small left pleural effusion is suspected. The heart and pulmonary vascularity are normal. There is calcification in the wall of the aortic arch. The bony thorax exhibits no acute abnormality. IMPRESSION: Progressive interstitial and airspace opacities in the left lower lobe worrisome for pneumonia. This may reflect a post obstructive process given the significant left-sided mediastinal lymphadenopathy  described on the CT scan of July 06, 2018. A small amount of increased density in the left upper lobe may reflect atelectasis or pneumonia. Underlying COPD-smoking related changes. Thoracic aortic atherosclerosis. Electronically Signed   By: David  Martinique M.D.   On: 07/16/2018 09:31   Mr Jeri Cos XN Contrast  Result Date: 08/02/2018 CLINICAL DATA:  Small cell lung cancer staging EXAM: MRI HEAD WITHOUT AND WITH CONTRAST TECHNIQUE: Multiplanar, multiecho pulse sequences of the brain and surrounding structures were obtained without and with intravenous contrast. CONTRAST:  7 mL Gadovist IV COMPARISON:  None. FINDINGS: Brain: Ventricle size normal. Left basal ganglia hyper intensities. Multiple small cerebral white matter hyperintensities bilaterally. Patchy hyperintensity in the pons. Small focus of diffusion signal in the left mid cerebellum without enhancement. It is difficult determine if this is facilitated or restricted diffusion based on ADC map. Mild hyperintensity in this area on FLAIR. Postcontrast imaging reveals no enhancing lesions in the brain Vascular: Normal arterial flow voids Skull and upper cervical spine: Multiple skull lesions are present compatible with metastatic disease. Largest lesion in the left parietal bone with enhancement. Sinuses/Orbits: Negative Other: None IMPRESSION: 1. Multiple metastatic deposits to the calvarium 2. Chronic microvascular ischemic changes in the white matter and pons. 3. Small 5 mm diffusion hyperintensity left mid cerebellum without enhancement. This could represent metastatic disease however without enhancement, this could also represent a subacute infarct. Short term MRI follow-up with contrast suggested 1 month. Electronically Signed   By: Franchot Gallo M.D.   On: 08/02/2018 13:09   Nm Pet Image Initial (pi) Skull Base To Thigh  Result Date: 07/25/2018 CLINICAL DATA:  Initial treatment strategy for lung cancer. EXAM: NUCLEAR MEDICINE PET SKULL BASE TO  THIGH TECHNIQUE: 8.58 mCi F-18 FDG was injected intravenously. Full-ring PET imaging was performed from the skull base to thigh after the radiotracer. CT data was obtained and used for attenuation correction and anatomic localization. Fasting blood glucose: 96 mg/dl COMPARISON:  CT chest 07/06/2018 FINDINGS: Mediastinal blood pool activity: SUV max 2.14 NECK: No hypermetabolic lymph nodes in the neck. Incidental CT findings: none CHEST: The left lower lobe perihilar lung mass with mediastinal invasion is again noted. This has a maximum diameter of approximately 5.3 cm within SUV max of 10.79. Complete obstruction of the left lower lobe bronchus with extensive postobstructive consolidation of the entire left lower lobe noted. Adjacent left hilar lymph node is hypermetabolic with an SUV max of 9.48. Enlarged and hypermetabolic left pre-vascular lymph node measures 2.2 cm and has an SUV max of 7.93. Enlarged left paratracheal lymph node measures 1.4 cm and has an SUV max of 3.14. No hypermetabolic subcarinal or right hilar  lymph nodes. No hypermetabolic axillary or supraclavicular nodes. Scattered multifocal ill-defined areas of ground-glass attenuation are noted predominantly involving the right lung. These have a somewhat different configuration than on study from 07/06/2018 favoring inflammatory/infectious process. The dominant subpleural ground-glass attenuating area within the lateral right upper lobe is smaller on today's study compatible with resolving inflammation/infection. Incidental CT findings: Aortic atherosclerosis. Calcification in the LAD and RCA coronary arteries noted. ABDOMEN/PELVIS: Within a background of probable early cirrhotic changes. Lesions are difficult to identified on the unenhanced CT images. Within the lateral segment of left lobe of liver there is a lesion with SUV max of 5.53 measuring approximately 2.2 cm. Segment 4 lesion measures approximately 2.1 cm and has an SUV max of 7.37.  Segment 8 lesion measures approximately 1.7 cm and has an SUV max of 5.28. No abnormal uptake within the pancreas or spleen. No abnormal uptake within the adrenal glands. Hypermetabolic porta hepatic lymph nodes are identified. Index node measures 1.5 cm and has an SUV max of 7.68. Status post bilateral pelvic nodal dissection. Incidental CT findings: Aortic atherosclerosis identified. There is diffuse hepatic steatosis with enlargement of the lateral segment of left lobe and caudate lobe of liver. The contour of the liver is diffusely nodular. A small volume of ascites is identified within the pelvis. Status post hysterectomy. SKELETON: There is a suspicious focus of mild increased uptake within the proximal right femur within SUV max of 2.4. On the corresponding CT images there is a well-circumscribed soft tissue lesion within the bone marrow measuring 1.7 cm. Solitary focus of increased uptake localizing to the lateral aspect of the left ninth rib has an SUV max of 4.47. Underlying posttraumatic deformity is noted. Incidental CT findings: none IMPRESSION: 1. Large intensely hypermetabolic left perihilar lung mass is identified invading the mediastinum and obstructing the left lower lobe bronchus. Findings compatible with primary bronchogenic carcinoma. Hypermetabolic left hilar and left mediastinal lymph nodes are identified. Additionally, there are multifocal areas of increased uptake within the liver suspicious for metastatic disease. At least 1 focus of increased uptake is identified within the proximal right femur which is concerning for bone metastases. 2. Multiple enlarged and hypermetabolic upper abdominal lymph nodes. Suspicious for nodal metastasis. 3. Morphologic features of liver compatible with cirrhosis. In a patient who is at increased risk for hepatoma consider further characterization of hypermetabolic liver lesions with contrast enhanced liver protocol MRI. 4. Multiple patchy areas of  ground-glass attenuation within the right lung exhibit a different morphology when compared with 07/06/2018 and are favored to represent sequelae of inflammatory or infectious process. 5.  Aortic Atherosclerosis (ICD10-I70.0). Electronically Signed   By: Kerby Moors M.D.   On: 07/25/2018 17:45   Ir Imaging Guided Port Insertion  Result Date: 08/02/2018 INDICATION: History of lung cancer. Poor venous access. In need of durable intravenous access for chemotherapy administration. EXAM: IMPLANTED PORT A CATH PLACEMENT WITH ULTRASOUND AND FLUOROSCOPIC GUIDANCE COMPARISON:  None. MEDICATIONS: Cleocin 900 mg IV; The antibiotic was administered within an appropriate time interval prior to skin puncture. ANESTHESIA/SEDATION: Moderate (conscious) sedation was employed during this procedure. A total of Versed 2 mg and Fentanyl 100 mcg was administered intravenously. Moderate Sedation Time: 30 minutes. The patient's level of consciousness and vital signs were monitored continuously by radiology nursing throughout the procedure under my direct supervision. CONTRAST:  None FLUOROSCOPY TIME:  24 seconds (6 mGy) COMPLICATIONS: None immediate. PROCEDURE: The procedure, risks, benefits, and alternatives were explained to the patient. Questions regarding the procedure were  encouraged and answered. The patient understands and consents to the procedure. The right neck and chest were prepped with chlorhexidine in a sterile fashion, and a sterile drape was applied covering the operative field. Maximum barrier sterile technique with sterile gowns and gloves were used for the procedure. A timeout was performed prior to the initiation of the procedure. Local anesthesia was provided with 1% lidocaine with epinephrine. After creating a small venotomy incision, a micropuncture kit was utilized to access the internal jugular vein. Real-time ultrasound guidance was utilized for vascular access including the acquisition of a permanent  ultrasound image documenting patency of the accessed vessel. The microwire was utilized to measure appropriate catheter length. A subcutaneous port pocket was then created along the upper chest wall utilizing a combination of sharp and blunt dissection. The pocket was irrigated with sterile saline. A single lumen thin power injectable port was chosen for placement. The 8 Fr catheter was tunneled from the port pocket site to the venotomy incision. The port was placed in the pocket. The external catheter was trimmed to appropriate length. At the venotomy, an 8 Fr peel-away sheath was placed over a guidewire under fluoroscopic guidance. The catheter was then placed through the sheath and the sheath was removed. Final catheter positioning was confirmed and documented with a fluoroscopic spot radiograph. The port was accessed with a Huber needle, aspirated and flushed with heparinized saline. The venotomy site was closed with an interrupted 4-0 Vicryl suture. The port pocket incision was closed with interrupted 2-0 Vicryl suture and the skin was opposed with a running subcuticular 4-0 Vicryl suture. Dermabond and Steri-strips were applied to both incisions. Dressings were placed. The patient tolerated the procedure well without immediate post procedural complication. FINDINGS: After catheter placement, the tip lies within the superior cavoatrial junction. The catheter aspirates and flushes normally and is ready for immediate use. IMPRESSION: Successful placement of a right internal jugular approach power injectable Port-A-Cath. The catheter is ready for immediate use. Electronically Signed   By: Sandi Mariscal M.D.   On: 08/02/2018 12:00   US Abdomen Limited Ruq  Result Date: 07/19/2018 CLINICAL DATA:  Abnormal LFTs, history COPD, pulmonary fibrosis, cervical cancer, hyperlipidemia, GERD, irritable bowel syndrome EXAM: ULTRASOUND ABDOMEN LIMITED RIGHT UPPER QUADRANT COMPARISON:  CT abdomen and pelvis 07/17/2013  FINDINGS: Gallbladder: Normally distended without stones or wall thickening. No pericholecystic fluid or sonographic Murphy sign. Common bile duct: Diameter: 2 mm diameter, normal Liver: Heterogeneous echogenicity. Multiple hepatic nodules are identified, ranging from hypoechoic to mildly hyperechoic and measuring up to 2.1 cm in size. Lesions are suspicious for hepatic metastatic disease and are new since the prior CT. Portal vein is patent on color Doppler imaging with normal direction of blood flow towards the liver. No RIGHT upper quadrant free fluid. Visualized portion of pancreas normal appearance. IMPRESSION: Heterogeneous hepatic echogenicity with multiple small hepatic nodules suspicious for metastatic disease; further evaluation by CT imaging with IV and oral contrast recommended. Electronically Signed   By: Lavonia Dana M.D.   On: 07/19/2018 08:16     ASSESSMENT/PLAN:  SCLC (small cell lung carcinoma), left ([place]) This is a very pleasant 62 year old white female recently diagnosed with extensive stage small cell lung cancer.  The patient had a recent PET scan that showed multiple metastatic disease in the lung, liver as well as bone. She is currently on systemic chemotherapy with carboplatin, etoposide and Tecentriq status post 1 cycle which she tolerated fairly well with no concerning complaints. She had  a recent MRI of the brain and is here to discuss the results.  The patient was seen with Dr. Julien Nordmann.  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  Recommend for her to proceed with cycle #2 of her chemotherapy today as scheduled.  She will continue to have weekly labs.  She will have a restaging CT scan of the chest, abdomen, pelvis prior to her next visit.  She will follow-up in 3 weeks for evaluation prior to cycle #3 of her treatment and to review her restaging CT scan results.  She  was advised to call immediately if she has any concerning symptoms in the interval. The patient voices understanding of current disease status and treatment options and is in agreement with the current care plan.  All questions were answered. The patient knows to call the clinic with any problems, questions or concerns. We can certainly see the patient much sooner if necessary.   Orders Placed This Encounter  Procedures  . CT ABDOMEN PELVIS W CONTRAST    Standing Status:   Future    Standing Expiration Date:   08/13/2019    Order Specific Question:   If indicated for the ordered procedure, I authorize the administration of contrast media per Radiology protocol    Answer:   Yes    Order Specific Question:   Preferred imaging location?    Answer:   [facility]    Order Specific Question:   Radiology Contrast Protocol - do NOT remove file path    Answer:   \\[email or web address]    Order Specific Question:   ** REASON FOR EXAM (FREE TEXT)    Answer:   Small cell lung cancer. Restaging.    Order Specific Question:   Is patient pregnant?    Answer:   No  . CT CHEST W CONTRAST    Standing Status:   Future    Standing Expiration Date:   08/13/2019    Order Specific Question:   If indicated for the ordered procedure, I authorize the administration of contrast media per Radiology protocol    Answer:   Yes    Order Specific Question:   Preferred imaging location?    Answer:   [facility]    Order Specific Question:   Radiology Contrast Protocol - do NOT remove file path    Answer:   [email or web address]    Order Specific Question:   ** REASON FOR EXAM (FREE TEXT)    Answer:   Small cell lung cancer. Restaging.    Order Specific Question:   Is patient pregnant?    Answer:   No     Mikey Bussing, DNP, AGPCNP-BC, AOCNP 08/09/18   ADDENDUM: Hematology/Oncology Attending: I had a face-to-face encounter with the patient today.   I recommended her care plan.  This is a very pleasant 62 years old white female recently diagnosed with extensive stage small cell lung cancer and she is currently undergoing systemic chemotherapy with carboplatin, etoposide and Tecentriq status post 1 cycle.  She tolerated the first cycle of her treatment well with no concerning adverse effects except for mild fatigue. 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 I recommended for the patient to continue her current treatment with systemic chemotherapy and she will proceed with  cycle #2 today. I will see her back for follow-up visit in 3 weeks for evaluation after repeating CT scan of the chest, abdomen and pelvis for restaging of her disease before starting cycle #3. She was advised to call immediately if she has any concerning symptoms in the interval.  Disclaimer: This note was dictated with voice recognition software. Similar sounding words can inadvertently be transcribed and may be missed upon review. Eilleen Kempf, MD 08/12/18

## 2018-08-09 NOTE — Telephone Encounter (Signed)
Called and let Baxter Flattery know this she verbalized understanding nothing further needed at this time.

## 2018-08-09 NOTE — Telephone Encounter (Signed)
CY Please advise if you are okay with patient using port for infusion for Alpha 1 treatment. Thanks.

## 2018-08-09 NOTE — Telephone Encounter (Signed)
Ok to use port.

## 2018-08-12 ENCOUNTER — Inpatient Hospital Stay: Payer: Medicare Other

## 2018-08-12 ENCOUNTER — Encounter: Payer: Self-pay | Admitting: Oncology

## 2018-08-12 ENCOUNTER — Other Ambulatory Visit: Payer: Self-pay

## 2018-08-12 ENCOUNTER — Telehealth: Payer: Self-pay | Admitting: Oncology

## 2018-08-12 ENCOUNTER — Inpatient Hospital Stay (HOSPITAL_BASED_OUTPATIENT_CLINIC_OR_DEPARTMENT_OTHER): Payer: Medicare Other | Admitting: Oncology

## 2018-08-12 VITALS — BP 139/82 | HR 91 | Temp 97.8°F | Resp 18 | Ht 65.0 in | Wt 171.8 lb

## 2018-08-12 DIAGNOSIS — Z7689 Persons encountering health services in other specified circumstances: Secondary | ICD-10-CM | POA: Diagnosis not present

## 2018-08-12 DIAGNOSIS — C3492 Malignant neoplasm of unspecified part of left bronchus or lung: Secondary | ICD-10-CM

## 2018-08-12 DIAGNOSIS — R5382 Chronic fatigue, unspecified: Secondary | ICD-10-CM

## 2018-08-12 DIAGNOSIS — R600 Localized edema: Secondary | ICD-10-CM | POA: Diagnosis not present

## 2018-08-12 DIAGNOSIS — Z5111 Encounter for antineoplastic chemotherapy: Secondary | ICD-10-CM

## 2018-08-12 DIAGNOSIS — Z79899 Other long term (current) drug therapy: Secondary | ICD-10-CM

## 2018-08-12 DIAGNOSIS — E785 Hyperlipidemia, unspecified: Secondary | ICD-10-CM | POA: Diagnosis not present

## 2018-08-12 DIAGNOSIS — C3411 Malignant neoplasm of upper lobe, right bronchus or lung: Secondary | ICD-10-CM

## 2018-08-12 DIAGNOSIS — Z87891 Personal history of nicotine dependence: Secondary | ICD-10-CM | POA: Diagnosis not present

## 2018-08-12 DIAGNOSIS — C3412 Malignant neoplasm of upper lobe, left bronchus or lung: Secondary | ICD-10-CM | POA: Diagnosis not present

## 2018-08-12 DIAGNOSIS — C7951 Secondary malignant neoplasm of bone: Secondary | ICD-10-CM | POA: Diagnosis not present

## 2018-08-12 DIAGNOSIS — R590 Localized enlarged lymph nodes: Secondary | ICD-10-CM

## 2018-08-12 DIAGNOSIS — M797 Fibromyalgia: Secondary | ICD-10-CM | POA: Diagnosis not present

## 2018-08-12 DIAGNOSIS — E559 Vitamin D deficiency, unspecified: Secondary | ICD-10-CM | POA: Diagnosis not present

## 2018-08-12 DIAGNOSIS — K219 Gastro-esophageal reflux disease without esophagitis: Secondary | ICD-10-CM | POA: Diagnosis not present

## 2018-08-12 DIAGNOSIS — Z5112 Encounter for antineoplastic immunotherapy: Secondary | ICD-10-CM

## 2018-08-12 DIAGNOSIS — C787 Secondary malignant neoplasm of liver and intrahepatic bile duct: Secondary | ICD-10-CM | POA: Diagnosis not present

## 2018-08-12 DIAGNOSIS — J449 Chronic obstructive pulmonary disease, unspecified: Secondary | ICD-10-CM | POA: Diagnosis not present

## 2018-08-12 DIAGNOSIS — K589 Irritable bowel syndrome without diarrhea: Secondary | ICD-10-CM | POA: Diagnosis not present

## 2018-08-12 LAB — CBC WITH DIFFERENTIAL (CANCER CENTER ONLY)
Basophils Absolute: 0.1 10*3/uL (ref 0.0–0.1)
Basophils Relative: 1 %
Eosinophils Absolute: 0.1 10*3/uL (ref 0.0–0.5)
Eosinophils Relative: 1 %
HCT: 33.6 % — ABNORMAL LOW (ref 34.8–46.6)
Hemoglobin: 11.4 g/dL — ABNORMAL LOW (ref 11.6–15.9)
Lymphocytes Relative: 26 %
Lymphs Abs: 1.8 10*3/uL (ref 0.9–3.3)
MCH: 29.4 pg (ref 25.1–34.0)
MCHC: 34 g/dL (ref 31.5–36.0)
MCV: 86.4 fL (ref 79.5–101.0)
Monocytes Absolute: 0.7 10*3/uL (ref 0.1–0.9)
Monocytes Relative: 9 %
Neutro Abs: 4.3 10*3/uL (ref 1.5–6.5)
Neutrophils Relative %: 63 %
Platelet Count: 294 10*3/uL (ref 145–400)
RBC: 3.89 MIL/uL (ref 3.70–5.45)
RDW: 18.7 % — ABNORMAL HIGH (ref 11.2–14.5)
WBC Count: 7 10*3/uL (ref 3.9–10.3)

## 2018-08-12 LAB — CMP (CANCER CENTER ONLY)
ALK PHOS: 394 U/L — AB (ref 38–126)
ALT: 45 U/L — AB (ref 0–44)
AST: 86 U/L — AB (ref 15–41)
Albumin: 2.6 g/dL — ABNORMAL LOW (ref 3.5–5.0)
Anion gap: 11 (ref 5–15)
BUN: 7 mg/dL (ref 6–20)
CALCIUM: 9 mg/dL (ref 8.9–10.3)
CO2: 27 mmol/L (ref 22–32)
Chloride: 94 mmol/L — ABNORMAL LOW (ref 98–111)
Creatinine: 0.54 mg/dL (ref 0.44–1.00)
GFR, Estimated: >60 mL/min (ref >60)
Glucose, Bld: 103 mg/dL — ABNORMAL HIGH (ref 70–99)
Potassium: 3.6 mmol/L (ref 3.5–5.1)
SODIUM: 132 mmol/L — AB (ref 135–145)
Total Bilirubin: 0.3 mg/dL (ref 0.3–1.2)
Total Protein: 6.9 g/dL (ref 6.5–8.1)

## 2018-08-12 LAB — TSH: TSH: 2.752 u[IU]/mL (ref 0.308–3.960)

## 2018-08-12 MED ORDER — SODIUM CHLORIDE 0.9 % IV SOLN
Freq: Once | INTRAVENOUS | Status: AC
  Administered 2018-08-12: 10:00:00 via INTRAVENOUS
  Filled 2018-08-12: qty 5

## 2018-08-12 MED ORDER — LIDOCAINE-PRILOCAINE 2.5-2.5 % EX CREA: 1.0000 "application " | TOPICAL_CREAM | CUTANEOUS | 0 refills | Status: DC | PRN

## 2018-08-12 MED ORDER — SODIUM CHLORIDE 0.9 % IV SOLN
571.5000 mg | Freq: Once | INTRAVENOUS | Status: AC
  Administered 2018-08-12: 570 mg via INTRAVENOUS
  Filled 2018-08-12: qty 57

## 2018-08-12 MED ORDER — SODIUM CHLORIDE 0.9 % IV SOLN
1200.0000 mg | Freq: Once | INTRAVENOUS | Status: AC
  Administered 2018-08-12: 1200 mg via INTRAVENOUS
  Filled 2018-08-12: qty 20

## 2018-08-12 MED ORDER — PALONOSETRON HCL INJECTION 0.25 MG/5ML
INTRAVENOUS | Status: AC
  Filled 2018-08-12: qty 5

## 2018-08-12 MED ORDER — SODIUM CHLORIDE 0.9 % IV SOLN
Freq: Once | INTRAVENOUS | Status: AC
  Administered 2018-08-12: 09:00:00 via INTRAVENOUS
  Filled 2018-08-12: qty 250

## 2018-08-12 MED ORDER — PALONOSETRON HCL INJECTION 0.25 MG/5ML
0.2500 mg | Freq: Once | INTRAVENOUS | Status: AC
  Administered 2018-08-12: 0.25 mg via INTRAVENOUS

## 2018-08-12 MED ORDER — SODIUM CHLORIDE 0.9 % IV SOLN
100.0000 mg/m2 | Freq: Once | INTRAVENOUS | Status: AC
  Administered 2018-08-12: 190 mg via INTRAVENOUS
  Filled 2018-08-12: qty 9.5

## 2018-08-12 NOTE — Progress Notes (Signed)
Patient requested IV vs use of port for today's treatment.  Per pt, "area is still just tender and I would rather have an IV."  Minimal amount of bruising noted at port site.  No drainage or redness.

## 2018-08-12 NOTE — Telephone Encounter (Signed)
Scheduled appt per 9/23 los - patient to get an updated schedule in Treatment area. Per Rn to give patient schedule.

## 2018-08-12 NOTE — Patient Instructions (Signed)
[facility] Discharge Instructions for Patients Receiving Chemotherapy  Today you received the following chemotherapy agents Tecentriq, Carboplatin and Etoposide  To help prevent nausea and vomiting after your treatment, we encourage you to take your nausea medication as prescribed.   If you develop nausea and vomiting that is not controlled by your nausea medication, call the clinic.   BELOW ARE SYMPTOMS THAT SHOULD BE REPORTED IMMEDIATELY:  *FEVER GREATER THAN 100.5 F  *CHILLS WITH OR WITHOUT FEVER  NAUSEA AND VOMITING THAT IS NOT CONTROLLED WITH YOUR NAUSEA MEDICATION  *UNUSUAL SHORTNESS OF BREATH  *UNUSUAL BRUISING OR BLEEDING  TENDERNESS IN MOUTH AND THROAT WITH OR WITHOUT PRESENCE OF ULCERS  *URINARY PROBLEMS  *BOWEL PROBLEMS  UNUSUAL RASH Items with * indicate a potential emergency and should be followed up as soon as possible.  Feel free to call the clinic should you have any questions or concerns. The clinic phone number is [phone].  Please show the [facility] at check-in to the Emergency Department and triage nurse.  Atezolizumab injection(Tecentriq) What is this medicine? ATEZOLIZUMAB (a te zoe LIZ ue mab) is a monoclonal antibody. It is used to treat bladder cancer (urothelial cancer) and non-small cell lung cancer. This medicine may be used for other purposes; ask your health care provider or pharmacist if you have questions. COMMON BRAND NAME(S): Tecentriq What should I tell my health care provider before I take this medicine? They need to know if you have any of these conditions: -diabetes -immune system problems -infection -inflammatory bowel disease -liver disease -lung or breathing disease -lupus -nervous system problems like myasthenia gravis or Guillain-Barre syndrome -organ transplant -an unusual or allergic reaction to atezolizumab, other medicines, foods, dyes, or preservatives -pregnant or trying to get  pregnant -breast-feeding How should I use this medicine? This medicine is for infusion into a vein. It is given by a health care professional in a hospital or clinic setting. A special MedGuide will be given to you before each treatment. Be sure to read this information carefully each time. Talk to your pediatrician regarding the use of this medicine in children. Special care may be needed. Overdosage: If you think you have taken too much of this medicine contact a poison control center or emergency room at once. NOTE: This medicine is only for you. Do not share this medicine with others. What if I miss a dose? It is important not to miss your dose. Call your doctor or health care professional if you are unable to keep an appointment. What may interact with this medicine? Interactions have not been studied. This list may not describe all possible interactions. Give your health care provider a list of all the medicines, herbs, non-prescription drugs, or dietary supplements you use. Also tell them if you smoke, drink alcohol, or use illegal drugs. Some items may interact with your medicine. What should I watch for while using this medicine? Your condition will be monitored carefully while you are receiving this medicine. You may need blood work done while you are taking this medicine. Do not become pregnant while taking this medicine or for at least 5 months after stopping it. Women should inform their doctor if they wish to become pregnant or think they might be pregnant. There is a potential for serious side effects to an unborn child. Talk to your health care professional or pharmacist for more information. Do not breast-feed an infant while taking this medicine or for at least 5 months after the last  dose. What side effects may I notice from receiving this medicine? Side effects that you should report to your doctor or health care professional as soon as possible: -allergic reactions like skin  rash, itching or hives, swelling of the face, lips, or tongue -black, tarry stools -bloody or watery diarrhea -breathing problems -changes in vision -chest pain or chest tightness -chills -facial flushing -fever -headache -signs and symptoms of high blood sugar such as dizziness; dry mouth; dry skin; fruity breath; nausea; stomach pain; increased hunger or thirst; increased urination -signs and symptoms of liver injury like dark yellow or brown urine; general ill feeling or flu-like symptoms; light-colored stools; loss of appetite; nausea; right upper belly pain; unusually weak or tired; yellowing of the eyes or skin -stomach pain -trouble passing urine or change in the amount of urine Side effects that usually do not require medical attention (report to your doctor or health care professional if they continue or are bothersome): -cough -diarrhea -joint pain -muscle pain -muscle weakness -tiredness -weight loss This list may not describe all possible side effects. Call your doctor for medical advice about side effects. You may report side effects to FDA at 1-800-FDA-1088. Where should I keep my medicine? This drug is given in a hospital or clinic and will not be stored at home. NOTE: This sheet is a summary. It may not cover all possible information. If you have questions about this medicine, talk to your doctor, pharmacist, or health care provider.  2018 Elsevier/Gold Standard (2015-12-08 17:54:14)  Carboplatin injection What is this medicine? CARBOPLATIN (KAR boe pla tin) is a chemotherapy drug. It targets fast dividing cells, like cancer cells, and causes these cells to die. This medicine is used to treat ovarian cancer and many other cancers. This medicine may be used for other purposes; ask your health care provider or pharmacist if you have questions. COMMON BRAND NAME(S): Paraplatin What should I tell my health care provider before I take this medicine? They need to know if  you have any of these conditions: -blood disorders -hearing problems -kidney disease -recent or ongoing radiation therapy -an unusual or allergic reaction to carboplatin, cisplatin, other chemotherapy, other medicines, foods, dyes, or preservatives -pregnant or trying to get pregnant -breast-feeding How should I use this medicine? This drug is usually given as an infusion into a vein. It is administered in a hospital or clinic by a specially trained health care professional. Talk to your pediatrician regarding the use of this medicine in children. Special care may be needed. Overdosage: If you think you have taken too much of this medicine contact a poison control center or emergency room at once. NOTE: This medicine is only for you. Do not share this medicine with others. What if I miss a dose? It is important not to miss a dose. Call your doctor or health care professional if you are unable to keep an appointment. What may interact with this medicine? -medicines for seizures -medicines to increase blood counts like filgrastim, pegfilgrastim, sargramostim -some antibiotics like amikacin, gentamicin, neomycin, streptomycin, tobramycin -vaccines Talk to your doctor or health care professional before taking any of these medicines: -acetaminophen -aspirin -ibuprofen -ketoprofen -naproxen This list may not describe all possible interactions. Give your health care provider a list of all the medicines, herbs, non-prescription drugs, or dietary supplements you use. Also tell them if you smoke, drink alcohol, or use illegal drugs. Some items may interact with your medicine. What should I watch for while using this medicine? Your condition  will be monitored carefully while you are receiving this medicine. You will need important blood work done while you are taking this medicine. This drug may make you feel generally unwell. This is not uncommon, as chemotherapy can affect healthy cells as well  as cancer cells. Report any side effects. Continue your course of treatment even though you feel ill unless your doctor tells you to stop. In some cases, you may be given additional medicines to help with side effects. Follow all directions for their use. Call your doctor or health care professional for advice if you get a fever, chills or sore throat, or other symptoms of a cold or flu. Do not treat yourself. This drug decreases your body's ability to fight infections. Try to avoid being around people who are sick. This medicine may increase your risk to bruise or bleed. Call your doctor or health care professional if you notice any unusual bleeding. Be careful brushing and flossing your teeth or using a toothpick because you may get an infection or bleed more easily. If you have any dental work done, tell your dentist you are receiving this medicine. Avoid taking products that contain aspirin, acetaminophen, ibuprofen, naproxen, or ketoprofen unless instructed by your doctor. These medicines may hide a fever. Do not become pregnant while taking this medicine. Women should inform their doctor if they wish to become pregnant or think they might be pregnant. There is a potential for serious side effects to an unborn child. Talk to your health care professional or pharmacist for more information. Do not breast-feed an infant while taking this medicine. What side effects may I notice from receiving this medicine? Side effects that you should report to your doctor or health care professional as soon as possible: -allergic reactions like skin rash, itching or hives, swelling of the face, lips, or tongue -signs of infection - fever or chills, cough, sore throat, pain or difficulty passing urine -signs of decreased platelets or bleeding - bruising, pinpoint red spots on the skin, black, tarry stools, nosebleeds -signs of decreased red blood cells - unusually weak or tired, fainting spells,  lightheadedness -breathing problems -changes in hearing -changes in vision -chest pain -high blood pressure -low blood counts - This drug may decrease the number of white blood cells, red blood cells and platelets. You may be at increased risk for infections and bleeding. -nausea and vomiting -pain, swelling, redness or irritation at the injection site -pain, tingling, numbness in the hands or feet -problems with balance, talking, walking -trouble passing urine or change in the amount of urine Side effects that usually do not require medical attention (report to your doctor or health care professional if they continue or are bothersome): -hair loss -loss of appetite -metallic taste in the mouth or changes in taste This list may not describe all possible side effects. Call your doctor for medical advice about side effects. You may report side effects to FDA at 1-800-FDA-1088. Where should I keep my medicine? This drug is given in a hospital or clinic and will not be stored at home. NOTE: This sheet is a summary. It may not cover all possible information. If you have questions about this medicine, talk to your doctor, pharmacist, or health care provider.  2018 Elsevier/Gold Standard (2008-02-11 14:38:05)   Etoposide, VP-16 capsules What is this medicine? ETOPOSIDE, VP-16 (e toe POE side) is a chemotherapy drug. It is used to treat small cell lung cancer and other cancers. This medicine may be used for other  purposes; ask your health care provider or pharmacist if you have questions. COMMON BRAND NAME(S): VePesid What should I tell my health care provider before I take this medicine? They need to know if you have any of these conditions: -infection -kidney disease -liver disease -low blood counts, like low white cell, platelet, or red cell counts -an unusual or allergic reaction to etoposide, other medicines, foods, dyes, or preservatives -pregnant or trying to get  pregnant -breast-feeding How should I use this medicine? Take this medicine by mouth with a glass of water. Follow the directions on the prescription label. Do not open, crush, or chew the capsules. It is advisable to wear gloves when handling this medicine. Take your medicine at regular intervals. Do not take it more often than directed. Do not stop taking except on your doctor's advice. Talk to your pediatrician regarding the use of this medicine in children. Special care may be needed. Overdosage: If you think you have taken too much of this medicine contact a poison control center or emergency room at once. NOTE: This medicine is only for you. Do not share this medicine with others. What if I miss a dose? If you miss a dose, take it as soon as you can. If it is almost time for your next dose, take only that dose. Do not take double or extra doses. What may interact with this medicine? -aspirin -certain medications for seizures like carbamazepine, phenobarbital, phenytoin, valproic acid -cyclosporine -levamisole -valproic acid -warfarin This list may not describe all possible interactions. Give your health care provider a list of all the medicines, herbs, non-prescription drugs, or dietary supplements you use. Also tell them if you smoke, drink alcohol, or use illegal drugs. Some items may interact with your medicine. What should I watch for while using this medicine? Visit your doctor for checks on your progress. This drug may make you feel generally unwell. This is not uncommon, as chemotherapy can affect healthy cells as well as cancer cells. Report any side effects. Continue your course of treatment even though you feel ill unless your doctor tells you to stop. In some cases, you may be given additional medicines to help with side effects. Follow all directions for their use. Call your doctor or health care professional for advice if you get a fever, chills or sore throat, or other  symptoms of a cold or flu. Do not treat yourself. This drug decreases your body's ability to fight infections. Try to avoid being around people who are sick. This medicine may increase your risk to bruise or bleed. Call your doctor or health care professional if you notice any unusual bleeding. Talk to your doctor about your risk of cancer. You may be more at risk for certain types of cancers if you take this medicine. Do not become pregnant while taking this medicine or for at least 6 months after stopping it. Women should inform their doctor if they wish to become pregnant or think they might be pregnant. Women of child-bearing potential will need to have a negative pregnancy test before starting this medicine. There is a potential for serious side effects to an unborn child. Talk to your health care professional or pharmacist for more information. Do not breast-feed an infant while taking this medicine. Men must use a latex condom during sexual contact with a woman while taking this medicine and for at least 4 months after stopping it. A latex condom is needed even if you have had a vasectomy. Contact your  doctor right away if your partner becomes pregnant. Do not donate sperm while taking this medicine and for 4 months after you stop taking this medicine. Men should inform their doctors if they wish to father a child. This medicine may lower sperm counts. What side effects may I notice from receiving this medicine? Side effects that you should report to your doctor or health care professional as soon as possible: -allergic reactions like skin rash, itching or hives, swelling of the face, lips, or tongue -low blood counts - this medicine may decrease the number of white blood cells, red blood cells and platelets. You may be at increased risk for infections and bleeding. -signs of infection - fever or chills, cough, sore throat, pain or difficulty passing urine -signs of decreased platelets or bleeding -  bruising, pinpoint red spots on the skin, black, tarry stools, blood in the urine -signs of decreased red blood cells - unusually weak or tired, fainting spells, lightheadedness -breathing problems -changes in vision -mouth or throat sores or ulcers -pain, tingling, numbness in the hands or feet -redness, blistering, peeling or loosening of the skin, including inside the mouth -seizures -vomiting Side effects that usually do not require medical attention (report to your doctor or health care professional if they continue or are bothersome): -change in taste -diarrhea -hair loss -nausea -stomach pain This list may not describe all possible side effects. Call your doctor for medical advice about side effects. You may report side effects to FDA at 1-800-FDA-1088. Where should I keep my medicine? Keep out of the reach of children. Store in a refrigerator between 2 and 8 degrees C (36 and 46 degrees F). Do not freeze. Throw away any unused medicine after the expiration date. NOTE: This sheet is a summary. It may not cover all possible information. If you have questions about this medicine, talk to your doctor, pharmacist, or health care provider.  2018 Elsevier/Gold Standard (2015-10-29 11:49:52)  Allopurinol injection What is this medicine? ALLOPURINOL (al oh PURE i nole) reduces the amount of uric acid the body makes during chemotherapy. Too much uric acid in the blood can cause damage to your kidneys. This medicine may be used for other purposes; ask your health care provider or pharmacist if you have questions. COMMON BRAND NAME(S): Aloprim What should I tell my health care provider before I take this medicine? They need to know if you have any of these conditions: -kidney disease -liver disease -an unusual or allergic reaction to allopurinol, other medicines, foods, dyes, or preservatives -pregnant or trying to get pregnant -breast feeding How should I use this medicine? The  medicine is for infusion into a vein. It is given by a health care professional in a hospital or clinic setting. Talk to your pediatrician regarding the use of this medicine in children. Special care may be needed. Overdosage: If you think you have taken too much of this medicine contact a poison control center or emergency room at once. NOTE: This medicine is only for you. Do not share this medicine with others. What if I miss a dose? This does not apply. What may interact with this medicine? Do not take this medicine with the following medication: -didanosine, ddI This medicine may also interact with the following medications: -amoxicillin or ampicillin -azathioprine -certain medicines used to treat gout -chlorpropamide -cyclosporine -diuretics -mercaptopurine -probenecid -sulfinpyrazone -warfarin This list may not describe all possible interactions. Give your health care provider a list of all the medicines, herbs, non-prescription drugs, or  dietary supplements you use. Also tell them if you smoke, drink alcohol, or use illegal drugs. Some items may interact with your medicine. What should I watch for while using this medicine? Your condition will be monitored carefully while you are receiving this medicine. You may get drowsy or dizzy. Do not drive, use machinery, or do anything that needs mental alertness until you know how this medicine affects you. Do not stand or sit up quickly, especially if you are an older patient. This reduces the risk of dizzy or fainting spells. Drink plenty of water while you are taking this medicine. This will help to reduce the risk of getting gout or kidney stones. What side effects may I notice from receiving this medicine? Side effects that you should report to your doctor or health care professional as soon as possible: -allergic reactions like skin rash, itching or hives, swelling of the face, lips, or tongue -difficulty passing urine -loss of  appetite -redness, blistering, peeling or loosening of the skin, including inside the mouth -unusual bleeding or bruising -unusually weak or tired -vomiting -yellowing of the skin or whites of the eyes Side effects that usually do not require medical attention (report to your doctor or health care professional if they continue or are bothersome): -diarrhea -drowsiness -nausea -stomach pain This list may not describe all possible side effects. Call your doctor for medical advice about side effects. You may report side effects to FDA at 1-800-FDA-1088. Where should I keep my medicine? This drug is given in a hospital or clinic and will not be stored at home. NOTE: This sheet is a summary. It may not cover all possible information. If you have questions about this medicine, talk to your doctor, pharmacist, or health care provider.  2018 Elsevier/Gold Standard (2008-05-12 10:32:21)

## 2018-08-13 ENCOUNTER — Inpatient Hospital Stay: Payer: Medicare Other

## 2018-08-13 VITALS — BP 120/71 | HR 87 | Temp 97.7°F | Resp 16

## 2018-08-13 DIAGNOSIS — C3411 Malignant neoplasm of upper lobe, right bronchus or lung: Secondary | ICD-10-CM | POA: Diagnosis not present

## 2018-08-13 DIAGNOSIS — C3412 Malignant neoplasm of upper lobe, left bronchus or lung: Secondary | ICD-10-CM | POA: Diagnosis not present

## 2018-08-13 DIAGNOSIS — J449 Chronic obstructive pulmonary disease, unspecified: Secondary | ICD-10-CM | POA: Diagnosis not present

## 2018-08-13 DIAGNOSIS — C7951 Secondary malignant neoplasm of bone: Secondary | ICD-10-CM | POA: Diagnosis not present

## 2018-08-13 DIAGNOSIS — C3492 Malignant neoplasm of unspecified part of left bronchus or lung: Secondary | ICD-10-CM

## 2018-08-13 DIAGNOSIS — R590 Localized enlarged lymph nodes: Secondary | ICD-10-CM | POA: Diagnosis not present

## 2018-08-13 DIAGNOSIS — C787 Secondary malignant neoplasm of liver and intrahepatic bile duct: Secondary | ICD-10-CM | POA: Diagnosis not present

## 2018-08-13 DIAGNOSIS — K589 Irritable bowel syndrome without diarrhea: Secondary | ICD-10-CM | POA: Diagnosis not present

## 2018-08-13 DIAGNOSIS — R600 Localized edema: Secondary | ICD-10-CM | POA: Diagnosis not present

## 2018-08-13 DIAGNOSIS — E785 Hyperlipidemia, unspecified: Secondary | ICD-10-CM | POA: Diagnosis not present

## 2018-08-13 DIAGNOSIS — E559 Vitamin D deficiency, unspecified: Secondary | ICD-10-CM | POA: Diagnosis not present

## 2018-08-13 DIAGNOSIS — Z7689 Persons encountering health services in other specified circumstances: Secondary | ICD-10-CM | POA: Diagnosis not present

## 2018-08-13 DIAGNOSIS — Z5111 Encounter for antineoplastic chemotherapy: Secondary | ICD-10-CM | POA: Diagnosis not present

## 2018-08-13 DIAGNOSIS — Z79899 Other long term (current) drug therapy: Secondary | ICD-10-CM | POA: Diagnosis not present

## 2018-08-13 DIAGNOSIS — Z87891 Personal history of nicotine dependence: Secondary | ICD-10-CM | POA: Diagnosis not present

## 2018-08-13 DIAGNOSIS — M797 Fibromyalgia: Secondary | ICD-10-CM | POA: Diagnosis not present

## 2018-08-13 DIAGNOSIS — K219 Gastro-esophageal reflux disease without esophagitis: Secondary | ICD-10-CM | POA: Diagnosis not present

## 2018-08-13 MED ORDER — SODIUM CHLORIDE 0.9 % IV SOLN
100.0000 mg/m2 | Freq: Once | INTRAVENOUS | Status: AC
  Administered 2018-08-13: 190 mg via INTRAVENOUS
  Filled 2018-08-13: qty 9.5

## 2018-08-13 MED ORDER — DEXAMETHASONE SODIUM PHOSPHATE 10 MG/ML IJ SOLN
INTRAMUSCULAR | Status: AC
  Filled 2018-08-13: qty 1

## 2018-08-13 MED ORDER — SODIUM CHLORIDE 0.9 % IV SOLN
Freq: Once | INTRAVENOUS | Status: AC
  Administered 2018-08-13: 14:00:00 via INTRAVENOUS
  Filled 2018-08-13: qty 250

## 2018-08-13 MED ORDER — DEXAMETHASONE SODIUM PHOSPHATE 10 MG/ML IJ SOLN
10.0000 mg | Freq: Once | INTRAMUSCULAR | Status: AC
  Administered 2018-08-13: 10 mg via INTRAVENOUS

## 2018-08-13 NOTE — Patient Instructions (Signed)
[facility] Discharge Instructions for Patients Receiving Chemotherapy  Today you received the following chemotherapy agents: etoposide  To help prevent nausea and vomiting after your treatment, we encourage you to take your nausea medication as directed.   If you develop nausea and vomiting that is not controlled by your nausea medication, call the clinic.   BELOW ARE SYMPTOMS THAT SHOULD BE REPORTED IMMEDIATELY:  *FEVER GREATER THAN 100.5 F  *CHILLS WITH OR WITHOUT FEVER  NAUSEA AND VOMITING THAT IS NOT CONTROLLED WITH YOUR NAUSEA MEDICATION  *UNUSUAL SHORTNESS OF BREATH  *UNUSUAL BRUISING OR BLEEDING  TENDERNESS IN MOUTH AND THROAT WITH OR WITHOUT PRESENCE OF ULCERS  *URINARY PROBLEMS  *BOWEL PROBLEMS  UNUSUAL RASH Items with * indicate a potential emergency and should be followed up as soon as possible.  Feel free to call the clinic should you have any questions or concerns. The clinic phone number is [phone].  Please show the CHEMO ALERT CARD at check-in to the Emergency Department and triage nurse.   

## 2018-08-14 ENCOUNTER — Telehealth: Payer: Self-pay | Admitting: Internal Medicine

## 2018-08-14 ENCOUNTER — Other Ambulatory Visit: Payer: Self-pay | Admitting: Internal Medicine

## 2018-08-14 ENCOUNTER — Inpatient Hospital Stay: Payer: Medicare Other

## 2018-08-14 VITALS — BP 128/77 | HR 80 | Temp 97.7°F | Resp 16

## 2018-08-14 DIAGNOSIS — R590 Localized enlarged lymph nodes: Secondary | ICD-10-CM | POA: Diagnosis not present

## 2018-08-14 DIAGNOSIS — Z7689 Persons encountering health services in other specified circumstances: Secondary | ICD-10-CM | POA: Diagnosis not present

## 2018-08-14 DIAGNOSIS — K589 Irritable bowel syndrome without diarrhea: Secondary | ICD-10-CM | POA: Diagnosis not present

## 2018-08-14 DIAGNOSIS — E785 Hyperlipidemia, unspecified: Secondary | ICD-10-CM | POA: Diagnosis not present

## 2018-08-14 DIAGNOSIS — R600 Localized edema: Secondary | ICD-10-CM | POA: Diagnosis not present

## 2018-08-14 DIAGNOSIS — Z79899 Other long term (current) drug therapy: Secondary | ICD-10-CM | POA: Diagnosis not present

## 2018-08-14 DIAGNOSIS — C7951 Secondary malignant neoplasm of bone: Secondary | ICD-10-CM | POA: Diagnosis not present

## 2018-08-14 DIAGNOSIS — Z5111 Encounter for antineoplastic chemotherapy: Secondary | ICD-10-CM | POA: Diagnosis not present

## 2018-08-14 DIAGNOSIS — E559 Vitamin D deficiency, unspecified: Secondary | ICD-10-CM | POA: Diagnosis not present

## 2018-08-14 DIAGNOSIS — M797 Fibromyalgia: Secondary | ICD-10-CM | POA: Diagnosis not present

## 2018-08-14 DIAGNOSIS — C3492 Malignant neoplasm of unspecified part of left bronchus or lung: Secondary | ICD-10-CM

## 2018-08-14 DIAGNOSIS — J449 Chronic obstructive pulmonary disease, unspecified: Secondary | ICD-10-CM | POA: Diagnosis not present

## 2018-08-14 DIAGNOSIS — C787 Secondary malignant neoplasm of liver and intrahepatic bile duct: Secondary | ICD-10-CM | POA: Diagnosis not present

## 2018-08-14 DIAGNOSIS — Z87891 Personal history of nicotine dependence: Secondary | ICD-10-CM | POA: Diagnosis not present

## 2018-08-14 DIAGNOSIS — C3411 Malignant neoplasm of upper lobe, right bronchus or lung: Secondary | ICD-10-CM | POA: Diagnosis not present

## 2018-08-14 DIAGNOSIS — K219 Gastro-esophageal reflux disease without esophagitis: Secondary | ICD-10-CM | POA: Diagnosis not present

## 2018-08-14 DIAGNOSIS — C3412 Malignant neoplasm of upper lobe, left bronchus or lung: Secondary | ICD-10-CM | POA: Diagnosis not present

## 2018-08-14 MED ORDER — DEXAMETHASONE SODIUM PHOSPHATE 10 MG/ML IJ SOLN
10.0000 mg | Freq: Once | INTRAMUSCULAR | Status: AC
  Administered 2018-08-14: 10 mg via INTRAVENOUS

## 2018-08-14 MED ORDER — SODIUM CHLORIDE 0.9 % IV SOLN
Freq: Once | INTRAVENOUS | Status: AC
  Administered 2018-08-14: 08:00:00 via INTRAVENOUS
  Filled 2018-08-14: qty 250

## 2018-08-14 MED ORDER — GLYCOPYRROLATE-FORMOTEROL 9-4.8 MCG/ACT IN AERO: 2.0000 | INHALATION_SPRAY | Freq: Two times a day (BID) | RESPIRATORY_TRACT | 12 refills | Status: DC

## 2018-08-14 MED ORDER — DEXAMETHASONE SODIUM PHOSPHATE 10 MG/ML IJ SOLN
INTRAMUSCULAR | Status: AC
  Filled 2018-08-14: qty 1

## 2018-08-14 MED ORDER — SODIUM CHLORIDE 0.9 % IV SOLN
100.0000 mg/m2 | Freq: Once | INTRAVENOUS | Status: AC
  Administered 2018-08-14: 190 mg via INTRAVENOUS
  Filled 2018-08-14: qty 9.5

## 2018-08-14 NOTE — Telephone Encounter (Signed)
Patient is aware, [name] has been sent in. Nothing further needed.

## 2018-08-14 NOTE — Progress Notes (Signed)
PAC site has no bruising or swelling, however pt preferred to have PIV today for tx pt states PAC "is a bit sore still"  Per Dr Julien Nordmann ok to tx today with AST 86

## 2018-08-14 NOTE — Patient Instructions (Signed)
[facility] Discharge Instructions for Patients Receiving Chemotherapy  Today you received the following chemotherapy agents: etoposide  To help prevent nausea and vomiting after your treatment, we encourage you to take your nausea medication as directed.   If you develop nausea and vomiting that is not controlled by your nausea medication, call the clinic.   BELOW ARE SYMPTOMS THAT SHOULD BE REPORTED IMMEDIATELY:  *FEVER GREATER THAN 100.5 F  *CHILLS WITH OR WITHOUT FEVER  NAUSEA AND VOMITING THAT IS NOT CONTROLLED WITH YOUR NAUSEA MEDICATION  *UNUSUAL SHORTNESS OF BREATH  *UNUSUAL BRUISING OR BLEEDING  TENDERNESS IN MOUTH AND THROAT WITH OR WITHOUT PRESENCE OF ULCERS  *URINARY PROBLEMS  *BOWEL PROBLEMS  UNUSUAL RASH Items with * indicate a potential emergency and should be followed up as soon as possible.  Feel free to call the clinic should you have any questions or concerns. The clinic phone number is [phone].  Please show the CHEMO ALERT CARD at check-in to the Emergency Department and triage nurse.   

## 2018-08-14 NOTE — Telephone Encounter (Signed)
Called and spoke with patient she stated that she has not tried her Anoro since given the sample back in July. She has continued to take her Bevespi and would like a refill. CY please advise if we can do this. Thank you.

## 2018-08-14 NOTE — Telephone Encounter (Signed)
Ok to refill Bevespi for one year

## 2018-08-16 ENCOUNTER — Inpatient Hospital Stay: Payer: Medicare Other

## 2018-08-16 DIAGNOSIS — C3492 Malignant neoplasm of unspecified part of left bronchus or lung: Secondary | ICD-10-CM

## 2018-08-16 DIAGNOSIS — K589 Irritable bowel syndrome without diarrhea: Secondary | ICD-10-CM | POA: Diagnosis not present

## 2018-08-16 DIAGNOSIS — E559 Vitamin D deficiency, unspecified: Secondary | ICD-10-CM | POA: Diagnosis not present

## 2018-08-16 DIAGNOSIS — J449 Chronic obstructive pulmonary disease, unspecified: Secondary | ICD-10-CM | POA: Diagnosis not present

## 2018-08-16 DIAGNOSIS — R600 Localized edema: Secondary | ICD-10-CM | POA: Diagnosis not present

## 2018-08-16 DIAGNOSIS — Z5111 Encounter for antineoplastic chemotherapy: Secondary | ICD-10-CM | POA: Diagnosis not present

## 2018-08-16 DIAGNOSIS — C787 Secondary malignant neoplasm of liver and intrahepatic bile duct: Secondary | ICD-10-CM | POA: Diagnosis not present

## 2018-08-16 DIAGNOSIS — C3412 Malignant neoplasm of upper lobe, left bronchus or lung: Secondary | ICD-10-CM | POA: Diagnosis not present

## 2018-08-16 DIAGNOSIS — E785 Hyperlipidemia, unspecified: Secondary | ICD-10-CM | POA: Diagnosis not present

## 2018-08-16 DIAGNOSIS — C3411 Malignant neoplasm of upper lobe, right bronchus or lung: Secondary | ICD-10-CM | POA: Diagnosis not present

## 2018-08-16 DIAGNOSIS — K219 Gastro-esophageal reflux disease without esophagitis: Secondary | ICD-10-CM | POA: Diagnosis not present

## 2018-08-16 DIAGNOSIS — R590 Localized enlarged lymph nodes: Secondary | ICD-10-CM | POA: Diagnosis not present

## 2018-08-16 DIAGNOSIS — Z7689 Persons encountering health services in other specified circumstances: Secondary | ICD-10-CM | POA: Diagnosis not present

## 2018-08-16 DIAGNOSIS — C7951 Secondary malignant neoplasm of bone: Secondary | ICD-10-CM | POA: Diagnosis not present

## 2018-08-16 DIAGNOSIS — Z87891 Personal history of nicotine dependence: Secondary | ICD-10-CM | POA: Diagnosis not present

## 2018-08-16 DIAGNOSIS — M797 Fibromyalgia: Secondary | ICD-10-CM | POA: Diagnosis not present

## 2018-08-16 DIAGNOSIS — Z79899 Other long term (current) drug therapy: Secondary | ICD-10-CM | POA: Diagnosis not present

## 2018-08-16 MED ORDER — PEGFILGRASTIM-CBQV 6 MG/0.6ML ~~LOC~~ SOSY
6.0000 mg | PREFILLED_SYRINGE | Freq: Once | SUBCUTANEOUS | Status: AC
  Administered 2018-08-16: 6 mg via SUBCUTANEOUS

## 2018-08-19 ENCOUNTER — Inpatient Hospital Stay: Payer: Medicare Other

## 2018-08-19 DIAGNOSIS — C3411 Malignant neoplasm of upper lobe, right bronchus or lung: Secondary | ICD-10-CM | POA: Diagnosis not present

## 2018-08-19 DIAGNOSIS — C787 Secondary malignant neoplasm of liver and intrahepatic bile duct: Secondary | ICD-10-CM | POA: Diagnosis not present

## 2018-08-19 DIAGNOSIS — C3492 Malignant neoplasm of unspecified part of left bronchus or lung: Secondary | ICD-10-CM

## 2018-08-19 DIAGNOSIS — E559 Vitamin D deficiency, unspecified: Secondary | ICD-10-CM | POA: Diagnosis not present

## 2018-08-19 DIAGNOSIS — R590 Localized enlarged lymph nodes: Secondary | ICD-10-CM | POA: Diagnosis not present

## 2018-08-19 DIAGNOSIS — R600 Localized edema: Secondary | ICD-10-CM | POA: Diagnosis not present

## 2018-08-19 DIAGNOSIS — C3412 Malignant neoplasm of upper lobe, left bronchus or lung: Secondary | ICD-10-CM | POA: Diagnosis not present

## 2018-08-19 DIAGNOSIS — Z7689 Persons encountering health services in other specified circumstances: Secondary | ICD-10-CM | POA: Diagnosis not present

## 2018-08-19 DIAGNOSIS — M797 Fibromyalgia: Secondary | ICD-10-CM | POA: Diagnosis not present

## 2018-08-19 DIAGNOSIS — Z87891 Personal history of nicotine dependence: Secondary | ICD-10-CM | POA: Diagnosis not present

## 2018-08-19 DIAGNOSIS — C7951 Secondary malignant neoplasm of bone: Secondary | ICD-10-CM | POA: Diagnosis not present

## 2018-08-19 DIAGNOSIS — J449 Chronic obstructive pulmonary disease, unspecified: Secondary | ICD-10-CM | POA: Diagnosis not present

## 2018-08-19 DIAGNOSIS — Z79899 Other long term (current) drug therapy: Secondary | ICD-10-CM | POA: Diagnosis not present

## 2018-08-19 DIAGNOSIS — Z5111 Encounter for antineoplastic chemotherapy: Secondary | ICD-10-CM | POA: Diagnosis not present

## 2018-08-19 DIAGNOSIS — K219 Gastro-esophageal reflux disease without esophagitis: Secondary | ICD-10-CM | POA: Diagnosis not present

## 2018-08-19 DIAGNOSIS — E785 Hyperlipidemia, unspecified: Secondary | ICD-10-CM | POA: Diagnosis not present

## 2018-08-19 DIAGNOSIS — K589 Irritable bowel syndrome without diarrhea: Secondary | ICD-10-CM | POA: Diagnosis not present

## 2018-08-19 LAB — CBC WITH DIFFERENTIAL (CANCER CENTER ONLY)
BASOS PCT: 1 %
Basophils Absolute: 0.1 10*3/uL (ref 0.0–0.1)
EOS ABS: 0.1 10*3/uL (ref 0.0–0.5)
Eosinophils Relative: 1 %
HEMATOCRIT: 31.2 % — AB (ref 34.8–46.6)
Hemoglobin: 10.5 g/dL — ABNORMAL LOW (ref 11.6–15.9)
Lymphocytes Relative: 16 %
Lymphs Abs: 2.2 10*3/uL (ref 0.9–3.3)
MCH: 29.4 pg (ref 25.1–34.0)
MCHC: 33.7 g/dL (ref 31.5–36.0)
MCV: 87.4 fL (ref 79.5–101.0)
MONO ABS: 0.6 10*3/uL (ref 0.1–0.9)
MONOS PCT: 5 %
Neutro Abs: 10.2 10*3/uL — ABNORMAL HIGH (ref 1.5–6.5)
Neutrophils Relative %: 77 %
Platelet Count: 270 10*3/uL (ref 145–400)
RBC: 3.58 MIL/uL — ABNORMAL LOW (ref 3.70–5.45)
RDW: 19.5 % — AB (ref 11.2–14.5)
WBC Count: 13.2 10*3/uL — ABNORMAL HIGH (ref 3.9–10.3)

## 2018-08-19 LAB — CMP (CANCER CENTER ONLY)
ALBUMIN: 3.1 g/dL — AB (ref 3.5–5.0)
ALT: 51 U/L — ABNORMAL HIGH (ref 0–44)
ANION GAP: 8 (ref 5–15)
AST: 72 U/L — AB (ref 15–41)
Alkaline Phosphatase: 343 U/L — ABNORMAL HIGH (ref 38–126)
BUN: 6 mg/dL (ref 6–20)
CO2: 32 mmol/L (ref 22–32)
Calcium: 9.4 mg/dL (ref 8.9–10.3)
Chloride: 98 mmol/L (ref 98–111)
Creatinine: 0.54 mg/dL (ref 0.44–1.00)
GFR, Est AFR Am: >60 mL/min (ref >60)
GFR, Estimated: >60 mL/min (ref >60)
Glucose, Bld: 105 mg/dL — ABNORMAL HIGH (ref 70–99)
POTASSIUM: 3.8 mmol/L (ref 3.5–5.1)
SODIUM: 138 mmol/L (ref 135–145)
Total Bilirubin: 0.4 mg/dL (ref 0.3–1.2)
Total Protein: 7.1 g/dL (ref 6.5–8.1)

## 2018-08-26 ENCOUNTER — Other Ambulatory Visit: Payer: Self-pay | Admitting: Family Medicine

## 2018-08-26 ENCOUNTER — Ambulatory Visit (HOSPITAL_COMMUNITY)
Admission: RE | Admit: 2018-08-26 | Discharge: 2018-08-26 | Disposition: A | Discharge status: Home / Self Care | Payer: Medicare Other | Source: Ambulatory Visit | Attending: Oncology | Admitting: Oncology

## 2018-08-26 ENCOUNTER — Other Ambulatory Visit: Payer: Self-pay | Admitting: Internal Medicine

## 2018-08-26 ENCOUNTER — Inpatient Hospital Stay: Payer: Medicare Other | Attending: Internal Medicine

## 2018-08-26 DIAGNOSIS — C3412 Malignant neoplasm of upper lobe, left bronchus or lung: Secondary | ICD-10-CM | POA: Insufficient documentation

## 2018-08-26 DIAGNOSIS — R590 Localized enlarged lymph nodes: Secondary | ICD-10-CM | POA: Insufficient documentation

## 2018-08-26 DIAGNOSIS — T792XXA Traumatic secondary and recurrent hemorrhage and seroma, initial encounter: Secondary | ICD-10-CM | POA: Insufficient documentation

## 2018-08-26 DIAGNOSIS — R918 Other nonspecific abnormal finding of lung field: Secondary | ICD-10-CM | POA: Insufficient documentation

## 2018-08-26 DIAGNOSIS — C7951 Secondary malignant neoplasm of bone: Secondary | ICD-10-CM | POA: Insufficient documentation

## 2018-08-26 DIAGNOSIS — E559 Vitamin D deficiency, unspecified: Secondary | ICD-10-CM | POA: Insufficient documentation

## 2018-08-26 DIAGNOSIS — C3490 Malignant neoplasm of unspecified part of unspecified bronchus or lung: Secondary | ICD-10-CM | POA: Diagnosis not present

## 2018-08-26 DIAGNOSIS — C3492 Malignant neoplasm of unspecified part of left bronchus or lung: Secondary | ICD-10-CM

## 2018-08-26 DIAGNOSIS — K7460 Unspecified cirrhosis of liver: Secondary | ICD-10-CM | POA: Insufficient documentation

## 2018-08-26 DIAGNOSIS — K219 Gastro-esophageal reflux disease without esophagitis: Secondary | ICD-10-CM | POA: Insufficient documentation

## 2018-08-26 DIAGNOSIS — L539 Erythematous condition, unspecified: Secondary | ICD-10-CM | POA: Insufficient documentation

## 2018-08-26 DIAGNOSIS — I700 Atherosclerosis of aorta: Secondary | ICD-10-CM | POA: Insufficient documentation

## 2018-08-26 DIAGNOSIS — K769 Liver disease, unspecified: Secondary | ICD-10-CM | POA: Insufficient documentation

## 2018-08-26 DIAGNOSIS — N289 Disorder of kidney and ureter, unspecified: Secondary | ICD-10-CM | POA: Diagnosis not present

## 2018-08-26 DIAGNOSIS — Z79899 Other long term (current) drug therapy: Secondary | ICD-10-CM | POA: Insufficient documentation

## 2018-08-26 DIAGNOSIS — K589 Irritable bowel syndrome without diarrhea: Secondary | ICD-10-CM | POA: Diagnosis not present

## 2018-08-26 DIAGNOSIS — Z7689 Persons encountering health services in other specified circumstances: Secondary | ICD-10-CM | POA: Diagnosis not present

## 2018-08-26 DIAGNOSIS — Z5111 Encounter for antineoplastic chemotherapy: Secondary | ICD-10-CM | POA: Diagnosis not present

## 2018-08-26 DIAGNOSIS — M797 Fibromyalgia: Secondary | ICD-10-CM | POA: Diagnosis not present

## 2018-08-26 DIAGNOSIS — J449 Chronic obstructive pulmonary disease, unspecified: Secondary | ICD-10-CM | POA: Insufficient documentation

## 2018-08-26 DIAGNOSIS — C787 Secondary malignant neoplasm of liver and intrahepatic bile duct: Secondary | ICD-10-CM | POA: Diagnosis not present

## 2018-08-26 DIAGNOSIS — E785 Hyperlipidemia, unspecified: Secondary | ICD-10-CM | POA: Insufficient documentation

## 2018-08-26 LAB — CBC WITH DIFFERENTIAL (CANCER CENTER ONLY)
BASOS ABS: 0 10*3/uL (ref 0.0–0.1)
BASOS PCT: 1 %
EOS ABS: 0.1 10*3/uL (ref 0.0–0.5)
EOS PCT: 2 %
HCT: 32.6 % — ABNORMAL LOW (ref 34.8–46.6)
Hemoglobin: 11 g/dL — ABNORMAL LOW (ref 11.6–15.9)
LYMPHS PCT: 31 %
Lymphs Abs: 2.4 10*3/uL (ref 0.9–3.3)
MCH: 30.4 pg (ref 25.1–34.0)
MCHC: 33.8 g/dL (ref 31.5–36.0)
MCV: 89.9 fL (ref 79.5–101.0)
Monocytes Absolute: 1 10*3/uL — ABNORMAL HIGH (ref 0.1–0.9)
Monocytes Relative: 13 %
Neutro Abs: 4.1 10*3/uL (ref 1.5–6.5)
Neutrophils Relative %: 53 %
PLATELETS: 133 10*3/uL — AB (ref 145–400)
RBC: 3.63 MIL/uL — AB (ref 3.70–5.45)
RDW: 21 % — ABNORMAL HIGH (ref 11.2–14.5)
WBC: 7.6 10*3/uL (ref 3.9–10.3)

## 2018-08-26 LAB — CMP (CANCER CENTER ONLY)
ALBUMIN: 3.2 g/dL — AB (ref 3.5–5.0)
ALT: 47 U/L — AB (ref 0–44)
AST: 72 U/L — AB (ref 15–41)
Alkaline Phosphatase: 330 U/L — ABNORMAL HIGH (ref 38–126)
Anion gap: 9 (ref 5–15)
BUN: 5 mg/dL — AB (ref 6–20)
CHLORIDE: 101 mmol/L (ref 98–111)
CO2: 32 mmol/L (ref 22–32)
CREATININE: 0.63 mg/dL (ref 0.44–1.00)
Calcium: 9.6 mg/dL (ref 8.9–10.3)
GFR, Est AFR Am: >60 mL/min (ref >60)
GFR, Estimated: >60 mL/min (ref >60)
GLUCOSE: 111 mg/dL — AB (ref 70–99)
POTASSIUM: 3.6 mmol/L (ref 3.5–5.1)
SODIUM: 142 mmol/L (ref 135–145)
Total Bilirubin: 0.3 mg/dL (ref 0.3–1.2)
Total Protein: 7.8 g/dL (ref 6.5–8.1)

## 2018-08-26 MED ORDER — IOHEXOL 300 MG/ML  SOLN
100.0000 mL | Freq: Once | INTRAMUSCULAR | Status: AC | PRN
  Administered 2018-08-26: 100 mL via INTRAVENOUS

## 2018-08-26 MED ORDER — SODIUM CHLORIDE 0.9 % IJ SOLN
INTRAMUSCULAR | Status: AC
  Filled 2018-08-26: qty 50

## 2018-09-03 ENCOUNTER — Telehealth: Payer: Self-pay | Admitting: Oncology

## 2018-09-03 ENCOUNTER — Encounter: Payer: Self-pay | Admitting: Oncology

## 2018-09-03 ENCOUNTER — Inpatient Hospital Stay: Payer: Medicare Other

## 2018-09-03 ENCOUNTER — Other Ambulatory Visit: Payer: Self-pay | Admitting: Medical Oncology

## 2018-09-03 ENCOUNTER — Inpatient Hospital Stay (HOSPITAL_BASED_OUTPATIENT_CLINIC_OR_DEPARTMENT_OTHER): Payer: Medicare Other | Admitting: Oncology

## 2018-09-03 VITALS — BP 131/74 | HR 96 | Temp 98.6°F | Resp 18 | Ht 65.0 in | Wt 175.4 lb

## 2018-09-03 DIAGNOSIS — Z79899 Other long term (current) drug therapy: Secondary | ICD-10-CM | POA: Diagnosis not present

## 2018-09-03 DIAGNOSIS — R5382 Chronic fatigue, unspecified: Secondary | ICD-10-CM

## 2018-09-03 DIAGNOSIS — C3412 Malignant neoplasm of upper lobe, left bronchus or lung: Secondary | ICD-10-CM | POA: Diagnosis not present

## 2018-09-03 DIAGNOSIS — C3492 Malignant neoplasm of unspecified part of left bronchus or lung: Secondary | ICD-10-CM

## 2018-09-03 DIAGNOSIS — E559 Vitamin D deficiency, unspecified: Secondary | ICD-10-CM | POA: Diagnosis not present

## 2018-09-03 DIAGNOSIS — Z7689 Persons encountering health services in other specified circumstances: Secondary | ICD-10-CM | POA: Diagnosis not present

## 2018-09-03 DIAGNOSIS — I700 Atherosclerosis of aorta: Secondary | ICD-10-CM | POA: Diagnosis not present

## 2018-09-03 DIAGNOSIS — M797 Fibromyalgia: Secondary | ICD-10-CM | POA: Diagnosis not present

## 2018-09-03 DIAGNOSIS — C787 Secondary malignant neoplasm of liver and intrahepatic bile duct: Secondary | ICD-10-CM

## 2018-09-03 DIAGNOSIS — T792XXA Traumatic secondary and recurrent hemorrhage and seroma, initial encounter: Secondary | ICD-10-CM | POA: Diagnosis not present

## 2018-09-03 DIAGNOSIS — J449 Chronic obstructive pulmonary disease, unspecified: Secondary | ICD-10-CM | POA: Diagnosis not present

## 2018-09-03 DIAGNOSIS — C7951 Secondary malignant neoplasm of bone: Secondary | ICD-10-CM | POA: Diagnosis not present

## 2018-09-03 DIAGNOSIS — K219 Gastro-esophageal reflux disease without esophagitis: Secondary | ICD-10-CM | POA: Diagnosis not present

## 2018-09-03 DIAGNOSIS — E785 Hyperlipidemia, unspecified: Secondary | ICD-10-CM | POA: Diagnosis not present

## 2018-09-03 DIAGNOSIS — L539 Erythematous condition, unspecified: Secondary | ICD-10-CM | POA: Diagnosis not present

## 2018-09-03 DIAGNOSIS — Z5111 Encounter for antineoplastic chemotherapy: Secondary | ICD-10-CM

## 2018-09-03 DIAGNOSIS — Z5112 Encounter for antineoplastic immunotherapy: Secondary | ICD-10-CM

## 2018-09-03 DIAGNOSIS — K589 Irritable bowel syndrome without diarrhea: Secondary | ICD-10-CM | POA: Diagnosis not present

## 2018-09-03 LAB — CMP (CANCER CENTER ONLY)
ALK PHOS: 272 U/L — AB (ref 38–126)
ALT: 31 U/L (ref 0–44)
ANION GAP: 8 (ref 5–15)
AST: 58 U/L — ABNORMAL HIGH (ref 15–41)
Albumin: 3.4 g/dL — ABNORMAL LOW (ref 3.5–5.0)
BILIRUBIN TOTAL: 0.4 mg/dL (ref 0.3–1.2)
BUN: 6 mg/dL (ref 6–20)
CALCIUM: 9.7 mg/dL (ref 8.9–10.3)
CO2: 31 mmol/L (ref 22–32)
Chloride: 102 mmol/L (ref 98–111)
Creatinine: 0.65 mg/dL (ref 0.44–1.00)
GFR, Est AFR Am: >60 mL/min (ref >60)
GLUCOSE: 104 mg/dL — AB (ref 70–99)
Potassium: 4.1 mmol/L (ref 3.5–5.1)
Sodium: 141 mmol/L (ref 135–145)
TOTAL PROTEIN: 8.2 g/dL — AB (ref 6.5–8.1)

## 2018-09-03 LAB — CBC WITH DIFFERENTIAL (CANCER CENTER ONLY)
ABS IMMATURE GRANULOCYTES: 0.04 10*3/uL (ref 0.00–0.07)
BASOS PCT: 1 %
Basophils Absolute: 0.1 10*3/uL (ref 0.0–0.1)
Eosinophils Absolute: 0.1 10*3/uL (ref 0.0–0.5)
Eosinophils Relative: 2 %
HCT: 37.4 % (ref 36.0–46.0)
Hemoglobin: 11.8 g/dL — ABNORMAL LOW (ref 12.0–15.0)
IMMATURE GRANULOCYTES: 1 %
LYMPHS PCT: 35 %
Lymphs Abs: 3.1 10*3/uL (ref 0.7–4.0)
MCH: 29.9 pg (ref 26.0–34.0)
MCHC: 31.6 g/dL (ref 30.0–36.0)
MCV: 94.7 fL (ref 80.0–100.0)
MONO ABS: 1.1 10*3/uL — AB (ref 0.1–1.0)
MONOS PCT: 12 %
NEUTROS ABS: 4.3 10*3/uL (ref 1.7–7.7)
Neutrophils Relative %: 49 %
Platelet Count: 310 10*3/uL (ref 150–400)
RBC: 3.95 MIL/uL (ref 3.87–5.11)
RDW: 21.7 % — ABNORMAL HIGH (ref 11.5–15.5)
WBC Count: 8.7 10*3/uL (ref 4.0–10.5)
nRBC: 0 % (ref 0.0–0.2)

## 2018-09-03 LAB — TSH: TSH: 2.282 u[IU]/mL (ref 0.308–3.960)

## 2018-09-03 MED ORDER — SODIUM CHLORIDE 0.9 % IV SOLN
100.0000 mg/m2 | Freq: Once | INTRAVENOUS | Status: AC
  Administered 2018-09-03: 190 mg via INTRAVENOUS
  Filled 2018-09-03: qty 9.5

## 2018-09-03 MED ORDER — SODIUM CHLORIDE 0.9 % IV SOLN
Freq: Once | INTRAVENOUS | Status: AC
  Administered 2018-09-03: 11:00:00 via INTRAVENOUS
  Filled 2018-09-03: qty 5

## 2018-09-03 MED ORDER — SODIUM CHLORIDE 0.9% FLUSH
10.0000 mL | INTRAVENOUS | Status: DC | PRN
  Administered 2018-09-03: 10 mL
  Filled 2018-09-03: qty 10

## 2018-09-03 MED ORDER — HEPARIN SOD (PORK) LOCK FLUSH 100 UNIT/ML IV SOLN
500.0000 [IU] | Freq: Once | INTRAVENOUS | Status: AC | PRN
  Administered 2018-09-03: 500 [IU]
  Filled 2018-09-03: qty 5

## 2018-09-03 MED ORDER — PALONOSETRON HCL INJECTION 0.25 MG/5ML
0.2500 mg | Freq: Once | INTRAVENOUS | Status: AC
  Administered 2018-09-03: 0.25 mg via INTRAVENOUS

## 2018-09-03 MED ORDER — PALONOSETRON HCL INJECTION 0.25 MG/5ML
INTRAVENOUS | Status: AC
  Filled 2018-09-03: qty 5

## 2018-09-03 MED ORDER — SODIUM CHLORIDE 0.9 % IV SOLN
Freq: Once | INTRAVENOUS | Status: AC
  Administered 2018-09-03: 10:00:00 via INTRAVENOUS
  Filled 2018-09-03: qty 250

## 2018-09-03 MED ORDER — SODIUM CHLORIDE 0.9 % IV SOLN
571.5000 mg | Freq: Once | INTRAVENOUS | Status: AC
  Administered 2018-09-03: 570 mg via INTRAVENOUS
  Filled 2018-09-03: qty 57

## 2018-09-03 MED ORDER — SODIUM CHLORIDE 0.9 % IV SOLN
1200.0000 mg | Freq: Once | INTRAVENOUS | Status: AC
  Administered 2018-09-03: 1200 mg via INTRAVENOUS
  Filled 2018-09-03: qty 20

## 2018-09-03 NOTE — Telephone Encounter (Signed)
Scheduled appt per 10/15 los - pt to get an updated schedule next visit.

## 2018-09-03 NOTE — Progress Notes (Signed)
[facility] OFFICE PROGRESS NOTE  [name], MD [place] [id number]  DIAGNOSIS:Extensive stage (T3, N2, M1a)small cell lung cancer diagnosed in August 2019 and presented with large left hilar/subhilar mass in addition to mediastinal lymphadenopathy and contracture and mass in the right upper lobe.  PRIOR THERAPY:None  CURRENT THERAPY:Therapy with carboplatin for AUC of 5 on day 1, etoposide 100 mg/M2 on days 1, 2 and 3 as well as Tecentriq (Atezolizumab) 1200 mg IV every 3 weeks with Neulasta support. Status post 2 cycles.  INTERVAL HISTORY: [name] 62 y.o. female returns for routine follow-up visit accompanied by her husband.  The patient is feeling fine today and has no specific complaints except for shortness of breath with exertion.  This is unchanged from her baseline.  She continues to wear home oxygen at 2 L/min.  She denies fevers and chills.  Denies chest pain, cough, hemoptysis.  Denies nausea, vomiting, constipation, diarrhea.  Denies recent weight loss or night sweats.  She had a restaging CT scan of the chest, abdomen, pelvis and is here to discuss the results.  MEDICAL HISTORY: Past Medical History:  Diagnosis Date  . Alpha-1-antitrypsin deficiency ([place])   . Cervical cancer ([place]) 2004  . 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 This doesn't fit entirely with emphysema.      . Exposure to hepatitis C 09/12/2016  . Fibromyalgia   . GERD (gastroesophageal reflux disease)   . Hyperlipidemia   . IBS (irritable bowel syndrome)   . Obstructive sleep apnea 06/02/2008   NPSG 06/07/08, AHI 6.9/ hr, weight 220 lbs    . Osteoporosis   . Pulmonary fibrosis, unspecified ([place]) 09/29/2016   CXR 03/28/2016  . Rhinitis, nonallergic 05/08/2012  . 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 Vitamin D deficiency     ALLERGIES:  is allergic to penicillins; actonel [risedronate sodium]; advair diskus [fluticasone-salmeterol]; alendronate sodium; aspirin; azithromycin; levofloxacin; morphine; nsaids; omnicef [cefdinir]; erythromycin; and iodine.  MEDICATIONS:  Current Outpatient Medications  Medication Sig Dispense Refill  . albuterol (PROVENTIL) (2.5 MG/3ML) 0.083% nebulizer solution Take 3 mLs (2.5 mg total) by nebulization every 6 (six) hours as needed. 90 mL 4  . allopurinol (ZYLOPRIM) 300 MG tablet Take 1 tablet (300 mg total) by mouth daily. Begin on 07/25/2018 14 tablet 0  . amitriptyline (ELAVIL) 75 MG tablet Take 75 mg by mouth at bedtime.      . benzonatate (TESSALON) 100 MG capsule Take 1 capsule (100 mg total) by mouth 3 (three) times daily as needed for cough. 20 capsule 0  . ezetimibe (ZETIA) 10 MG tablet TAKE 1 TABLET ONCE A DAY 90 tablet 1  . feeding supplement, ENSURE ENLIVE, (ENSURE ENLIVE) LIQD Take 237 mLs by mouth 2 (two) times daily between meals. 60 Bottle 0  . fluticasone (FLONASE) 50 MCG/ACT nasal spray USE 2 SPRAYS IN EACH NOSTRIL ONCE DAILY. 16 g 5  . folic acid (FOLVITE) 1 MG tablet Take 1 mg by mouth daily.    . Glycopyrrolate-Formoterol (BEVESPI AEROSPHERE) 9-4.8 MCG/ACT AERO Inhale 2 puffs into the lungs 2 (two) times daily. 1 Inhaler 12  . HYDROcodone-acetaminophen (NORCO/VICODIN) 5-325 MG tablet Take 1 tablet by mouth 3 (three) times daily.    Marland Kitchen lidocaine-prilocaine (EMLA) cream Apply 1 application topically as  needed. 30 g 0  . Nebulizers (COMPRESSOR NEBULIZER) MISC 1 Device by Does not apply route as needed. 1 each prn  . omeprazole (PRILOSEC) 40 MG capsule TAKE (1) CAPSULE DAILY (Patient taking differently: Take 40 mg by mouth daily. ) 90 capsule 1  . prochlorperazine (COMPAZINE) 10 MG tablet Take 1 tablet (10 mg total) by mouth every 6 (six) hours as needed for nausea or vomiting. 30 tablet 0  .  rosuvastatin (CRESTOR) 40 MG tablet TAKE 1/2 TABLET ONCE DAILY (Patient taking differently: Take 20 mg by mouth daily. ) 45 tablet 1   No current facility-administered medications for this visit.    Facility-Administered Medications Ordered in Other Visits  Medication Dose Route Frequency Provider Last Rate Last Dose  . etoposide (VEPESID) 190 mg in sodium chloride 0.9 % 500 mL chemo infusion  100 mg/m2 (Treatment Plan Recorded) Intravenous Once Curt Bears, MD 510 mL/hr at 09/03/18 1352 190 mg at 09/03/18 1352  . heparin lock flush 100 unit/mL  500 Units Intracatheter Once PRN Curt Bears, MD      . sodium chloride flush (NS) 0.9 % injection 10 mL  10 mL Intracatheter PRN Curt Bears, MD        SURGICAL HISTORY:  Past Surgical History:  Procedure Laterality Date  . ABDOMINAL HYSTERECTOMY    . IR IMAGING GUIDED PORT INSERTION  08/02/2018  . LUMBAR DISC SURGERY    . VIDEO BRONCHOSCOPY Bilateral 07/10/2018   Procedure: VIDEO BRONCHOSCOPY WITHOUT FLUORO;  Surgeon: Laurin Coder, MD;  Location: [facility];  Service: Cardiopulmonary;  Laterality: Bilateral;    REVIEW OF SYSTEMS:   Review of Systems  Constitutional: Negative for appetite change, chills, fatigue, fever and unexpected weight change.  HENT:   Negative for mouth sores, nosebleeds, sore throat and trouble swallowing.   Eyes: Negative for eye problems and icterus.  Respiratory: Negative for cough, hemoptysis, and wheezing.  Positive for her baseline shortness of breath.  She wears home oxygen. Cardiovascular: Negative for chest pain and leg swelling.  Gastrointestinal: Negative for abdominal pain, constipation, diarrhea, nausea and vomiting.  Genitourinary: Negative for bladder incontinence, difficulty urinating, dysuria, frequency and hematuria.   Musculoskeletal: Negative for back pain, gait problem, neck pain and neck stiffness.  Skin: Negative for itching and rash.  Neurological: Negative for dizziness,  extremity weakness, gait problem, headaches, light-headedness and seizures.  Hematological: Negative for adenopathy. Does not bruise/bleed easily.  Psychiatric/Behavioral: Negative for confusion, depression and sleep disturbance. The patient is not nervous/anxious.     PHYSICAL EXAMINATION:  Blood pressure 131/74, pulse 96, temperature 98.6 F (37 C), temperature source Oral, resp. rate 18, height 5\' 5"  (1.651 m), weight 175 lb 6.4 oz (79.6 kg), SpO2 98 %.  ECOG PERFORMANCE STATUS: 1 - Symptomatic but completely ambulatory  Physical Exam  Constitutional: Oriented to person, place, and time and well-developed, well-nourished, and in no distress. No distress.  HENT:  Head: Normocephalic and atraumatic.  Mouth/Throat: Oropharynx is clear and moist. No oropharyngeal exudate.  Eyes: Conjunctivae are normal. Right eye exhibits no discharge. Left eye exhibits no discharge. No scleral icterus.  Neck: Normal range of motion. Neck supple.  Cardiovascular: Normal rate, regular rhythm, normal heart sounds and intact distal pulses.   Pulmonary/Chest: Effort normal and breath sounds normal. No respiratory distress. No wheezes. No rales.  Abdominal: Soft. Bowel sounds are normal. Exhibits no distension and no mass. There is no tenderness.  Musculoskeletal: Normal range of motion. Exhibits no edema.  Lymphadenopathy:    No  cervical adenopathy.  Neurological: Alert and oriented to person, place, and time. Exhibits normal muscle tone. Gait normal. Coordination normal.  Skin: Skin is warm and dry. No rash noted. Not diaphoretic. No erythema. No pallor.  Psychiatric: Mood, memory and judgment normal.  Vitals reviewed.  LABORATORY DATA: Lab Results  Component Value Date   WBC 8.7 09/03/2018   HGB 11.8 (L) 09/03/2018   HCT 37.4 09/03/2018   MCV 94.7 09/03/2018   PLT 310 09/03/2018      Chemistry      Component Value Date/Time   NA 141 09/03/2018 0848   NA 124 (L) 07/16/2018 0853   K 4.1  09/03/2018 0848   CL 102 09/03/2018 0848   CO2 31 09/03/2018 0848   BUN 6 09/03/2018 0848   BUN 8 07/16/2018 0853   CREATININE 0.65 09/03/2018 0848   CREATININE 0.68 02/13/2013 1523      Component Value Date/Time   CALCIUM 9.7 09/03/2018 0848   ALKPHOS 272 (H) 09/03/2018 0848   AST 58 (H) 09/03/2018 0848   ALT 31 09/03/2018 0848   BILITOT 0.4 09/03/2018 0848       RADIOGRAPHIC STUDIES:  Ct Chest W Contrast  Result Date: 08/26/2018 CLINICAL DATA:  Small cell lung cancer diagnosed September 2019. Chemotherapy ongoing. Remote history of cervical cancer. Hysterectomy. EXAM: CT CHEST, ABDOMEN, AND PELVIS WITH CONTRAST TECHNIQUE: Multidetector CT imaging of the chest, abdomen and pelvis was performed following the standard protocol during bolus administration of intravenous contrast. CONTRAST:  155mL OMNIPAQUE IOHEXOL 300 MG/ML  SOLN COMPARISON:  PET-CT 07/25/2018, CT abdomen 07/06/2018, ultrasound 07/19/2018 FINDINGS: CT CHEST FINDINGS Cardiovascular: Coronary artery calcification and aortic atherosclerotic calcification. Port in the anterior chest wall with tip in distal SVC. Mediastinum/Nodes: No axillary supraclavicular adenopathy. No clear mediastinal hilar adenopathy. No pericardial effusion. Esophagus normal. In comparison to PET-CT 07/25/2018, marked reduction in the bulky AP window and LEFT hilar adenopathy. Lungs/Pleura: Marked improvement in consolidative pattern in the LEFT lower lobe. Marked improvement in the LEFT infrahilar mass obstructing the LEFT lobe bronchus. There is still mild narrowing of the bronchus (image 27/2) but there is now near complete re-expansion of the obstructed LEFT lower lobe. Mild atelectasis remains. Cluster of new nodules in the RIGHT lower lobe (image 36/2). This clustered pattern suggest a focus of infection or inflammation. Resolution of the patchy nodules in the RIGHT upper lobe seen on comparison PET-CT scan Musculoskeletal: Smudgy round sclerotic lesion  at T3, T5 and T10 consistent with skeletal metastasis. These lesions were not hypermetabolic on comparison PET-CT scan and not readily evident on the CT portion exam. CT ABDOMEN AND PELVIS FINDINGS Hepatobiliary: Liver has a fine nodular contour. Multiple low-density lesions throughout the LEFT RIGHT hepatic lobe are too numerous to count. Lesion were hypermetabolic on comparison PET-CT scan. Gallbladder is distended but not inflamed. Common bile duct normal. Pancreas: Pancreas is normal. No ductal dilatation. No pancreatic inflammation. Spleen: Normal spleen Adrenals/urinary tract: Adrenal glands are normal. The inferior pole of the RIGHT kidney there is a 2 cm low-density lesion (image 29/3) which cannot be fully characterize. Second more subtle region of hypoperfusion/enhancement in the mid RIGHT kidney (image 22/7). The ureters and bladder normal. Stomach/Bowel: Stomach, small bowel, appendix, and cecum are normal. The colon and rectosigmoid colon are normal. Vascular/Lymphatic: Abdominal aorta is normal caliber with atherosclerotic calcification. There is no retroperitoneal or periportal lymphadenopathy. No pelvic lymphadenopathy. Reproductive: Post hysterectomy Other: No peritoneal metastasis Musculoskeletal: New sclerotic lesion at L3 and L1 (sagittal image 84/6)  IMPRESSION: Chest Impression: 1. Marked improvement in the LEFT infrahilar mass and re-expansion of the LEFT lower lobe. 2. Marked improvement in mediastinal lymphadenopathy. 3. Resolution in the patchy nodules in the RIGHT upper lobe similar comparison exam. New nodularity in the RIGHT lower lobe is favored infectious. Recommend attention on follow-up. 4. 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 Abdomen / Pelvis Impression: 1. Multiple low-density lesions in liver are suspicious for metastasis. 2. Cirrhotic liver. 3. Low-density lesions in the cortex of the RIGHT kidney with differential including focal pyelonephritis versus  malignancy versus. Renal MRI could add specificity as warranted. Recommend clinical correlation for pyelonephritis. 4. 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 These new skeletal metastasis are sclerotic and not evident on comparison PET-CT scan. No hypermetabolic lesions present on comparison PET-CT scan. Presumably visualization of the lesions could relate to chemotherapy versus de Novo lesions. Electronically Signed   By: Suzy Bouchard M.D.   On: 08/26/2018 12:25   Ct Abdomen Pelvis W Contrast  Result Date: 08/26/2018 CLINICAL DATA:  Small cell lung cancer diagnosed September 2019. Chemotherapy ongoing. Remote history of cervical cancer. Hysterectomy. EXAM: CT CHEST, ABDOMEN, AND PELVIS WITH CONTRAST TECHNIQUE: Multidetector CT imaging of the chest, abdomen and pelvis was performed following the standard protocol during bolus administration of intravenous contrast. CONTRAST:  160mL OMNIPAQUE IOHEXOL 300 MG/ML  SOLN COMPARISON:  PET-CT 07/25/2018, CT abdomen 07/06/2018, ultrasound 07/19/2018 FINDINGS: CT CHEST FINDINGS Cardiovascular: Coronary artery calcification and aortic atherosclerotic calcification. Port in the anterior chest wall with tip in distal SVC. Mediastinum/Nodes: No axillary supraclavicular adenopathy. No clear mediastinal hilar adenopathy. No pericardial effusion. Esophagus normal. In comparison to PET-CT 07/25/2018, marked reduction in the bulky AP window and LEFT hilar adenopathy. Lungs/Pleura: Marked improvement in consolidative pattern in the LEFT lower lobe. Marked improvement in the LEFT infrahilar mass obstructing the LEFT lobe bronchus. There is still mild narrowing of the bronchus (image 27/2) but there is now near complete re-expansion of the obstructed LEFT lower lobe. Mild atelectasis remains. Cluster of new nodules in the RIGHT lower lobe (image 36/2). This clustered pattern suggest a focus of infection or inflammation. Resolution of the patchy nodules in the RIGHT upper  lobe seen on comparison PET-CT scan Musculoskeletal: Smudgy round sclerotic lesion at T3, T5 and T10 consistent with skeletal metastasis. These lesions were not hypermetabolic on comparison PET-CT scan and not readily evident on the CT portion exam. CT ABDOMEN AND PELVIS FINDINGS Hepatobiliary: Liver has a fine nodular contour. Multiple low-density lesions throughout the LEFT RIGHT hepatic lobe are too numerous to count. Lesion were hypermetabolic on comparison PET-CT scan. Gallbladder is distended but not inflamed. Common bile duct normal. Pancreas: Pancreas is normal. No ductal dilatation. No pancreatic inflammation. Spleen: Normal spleen Adrenals/urinary tract: Adrenal glands are normal. The inferior pole of the RIGHT kidney there is a 2 cm low-density lesion (image 29/3) which cannot be fully characterize. Second more subtle region of hypoperfusion/enhancement in the mid RIGHT kidney (image 22/7). The ureters and bladder normal. Stomach/Bowel: Stomach, small bowel, appendix, and cecum are normal. The colon and rectosigmoid colon are normal. Vascular/Lymphatic: Abdominal aorta is normal caliber with atherosclerotic calcification. There is no retroperitoneal or periportal lymphadenopathy. No pelvic lymphadenopathy. Reproductive: Post hysterectomy Other: No peritoneal metastasis Musculoskeletal: New sclerotic lesion at L3 and L1 (sagittal image 84/6) IMPRESSION: Chest Impression: 1. Marked improvement in the LEFT infrahilar mass and re-expansion of the LEFT lower lobe. 2. Marked improvement in mediastinal lymphadenopathy. 3. Resolution in the patchy nodules in the  RIGHT upper lobe similar comparison exam. New nodularity in the RIGHT lower lobe is favored infectious. Recommend attention on follow-up. 4. 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 Abdomen / Pelvis Impression: 1. Multiple low-density lesions in liver are suspicious for metastasis. 2. Cirrhotic liver. 3. Low-density lesions in the cortex  of the RIGHT kidney with differential including focal pyelonephritis versus malignancy versus. Renal MRI could add specificity as warranted. Recommend clinical correlation for pyelonephritis. 4. 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 These new skeletal metastasis are sclerotic and not evident on comparison PET-CT scan. No hypermetabolic lesions present on comparison PET-CT scan. Presumably visualization of the lesions could relate to chemotherapy versus de Novo lesions. Electronically Signed   By: Suzy Bouchard M.D.   On: 08/26/2018 12:25     ASSESSMENT/PLAN:  SCLC (small cell lung carcinoma), left ([place]) This is a very pleasant 62 year old white female recently diagnosed with extensive stage small cell lung cancer. The patient had a recent PET scan that showed multiple metastatic disease in the lung, liver as well as bone. She is currently on systemic chemotherapy with carboplatin, etoposide and Tecentriq status post 2 cycles which she tolerated fairly well with no concerning complaints.  She had a restaging CT scan of the chest, abdomen, pelvis and is here to discuss the results.  The patient was seen with Dr. Julien Nordmann.    CT scan results and images were reviewed with the patient and her husband.  We discussed that she had a marked improvement in her disease in the lungs.  The CT continues to note the nodules in the liver.  We discussed the skeletal metastasis that were noted on the CT scan.  Although these are noted is being new on the CT, they were probably present previously.  Recommend that she continue her current course of therapy.  She will proceed with cycle 3 of her treatment today as scheduled.  The patient will follow-up in 3 weeks for evaluation prior to cycle #4.  She was advised to call immediately if she has any concerning symptoms in the interval. The patient voices understanding of current disease status and treatment options and is in agreement with the current care  plan.  All questions were answered. The patient knows to call the clinic with any problems, questions or concerns. We can certainly see the patient much sooner if necessary.   No orders of the defined types were placed in this encounter.    Mikey Bussing, DNP, AGPCNP-BC, AOCNP 09/03/18   ADDENDUM: Hematology/Oncology Attending: I had a face-to-face encounter with the patient today.  I recommended her care plan.  This is a very pleasant 62 years old white female recently diagnosed with extensive stage small cell lung cancer.  She is currently undergoing systemic chemotherapy with carboplatin, etoposide and Tecentriq status post21 cycles.  The patient tolerated the first cycle of her treatment well.  She notes some improvement in her breathing after the first 2 cycles of her treatment. The patient had a repeat CT scan of the chest, abdomen and pelvis performed recently.  I personally and independently reviewed the scans and discussed the results with the patient and her husband.  Her scan showed market improvement in response to this therapy. I recommended for the patient to proceed with cycle #3 today as scheduled. She will come back for follow-up visit in 3 weeks for evaluation before the next cycle of her treatment. She was advised to call immediately if she has any concerning  symptoms in the interval. Disclaimer: This note was dictated with voice recognition software. Similar sounding words can inadvertently be transcribed and may be missed upon review. Eilleen Kempf, MD 09/03/18

## 2018-09-03 NOTE — Assessment & Plan Note (Addendum)
This is a very pleasant 62 year old white female recently diagnosed with extensive stage small cell lung cancer. The patient had a recent PET scan that showed multiple metastatic disease in the lung, liver as well as bone. She is currently on systemic chemotherapy with carboplatin, etoposide and Tecentriq status post 2 cycles which she tolerated fairly well with no concerning complaints.  She had a restaging CT scan of the chest, abdomen, pelvis and is here to discuss the results.  The patient was seen with Dr. Julien Nordmann.    CT scan results and images were reviewed with the patient and her husband.  We discussed that she had a marked improvement in her disease in the lungs.  The CT continues to note the nodules in the liver.  We discussed the skeletal metastasis that were noted on the CT scan.  Although these are noted is being new on the CT, they were probably present previously.  Recommend that she continue her current course of therapy.  She will proceed with cycle 3 of her treatment today as scheduled.  The patient will follow-up in 3 weeks for evaluation prior to cycle #4.  She was advised to call immediately if she has any concerning symptoms in the interval. The patient voices understanding of current disease status and treatment options and is in agreement with the current care plan.  All questions were answered. The patient knows to call the clinic with any problems, questions or concerns. We can certainly see the patient much sooner if necessary.

## 2018-09-03 NOTE — Patient Instructions (Signed)
[facility] Discharge Instructions for Patients Receiving Chemotherapy  Today you received the following chemotherapy agents Etoposide, Carboplatin and Tecentriq.  To help prevent nausea and vomiting after your treatment, we encourage you to take your nausea medication as directed. BUT NO ZOFRAN FOR 3 DAYS AFTER CHEMO.   If you develop nausea and vomiting that is not controlled by your nausea medication, call the clinic.   BELOW ARE SYMPTOMS THAT SHOULD BE REPORTED IMMEDIATELY:  *FEVER GREATER THAN 100.5 F  *CHILLS WITH OR WITHOUT FEVER  NAUSEA AND VOMITING THAT IS NOT CONTROLLED WITH YOUR NAUSEA MEDICATION  *UNUSUAL SHORTNESS OF BREATH  *UNUSUAL BRUISING OR BLEEDING  TENDERNESS IN MOUTH AND THROAT WITH OR WITHOUT PRESENCE OF ULCERS  *URINARY PROBLEMS  *BOWEL PROBLEMS  UNUSUAL RASH Items with * indicate a potential emergency and should be followed up as soon as possible.  Feel free to call the clinic you have any questions or concerns. The clinic phone number is [phone].  Please show the [facility] at check-in to the Emergency Department and triage nurse.

## 2018-09-04 ENCOUNTER — Inpatient Hospital Stay: Payer: Medicare Other

## 2018-09-04 VITALS — BP 123/72 | HR 92 | Temp 98.2°F | Resp 18

## 2018-09-04 DIAGNOSIS — Z7689 Persons encountering health services in other specified circumstances: Secondary | ICD-10-CM | POA: Diagnosis not present

## 2018-09-04 DIAGNOSIS — K589 Irritable bowel syndrome without diarrhea: Secondary | ICD-10-CM | POA: Diagnosis not present

## 2018-09-04 DIAGNOSIS — I700 Atherosclerosis of aorta: Secondary | ICD-10-CM | POA: Diagnosis not present

## 2018-09-04 DIAGNOSIS — C3492 Malignant neoplasm of unspecified part of left bronchus or lung: Secondary | ICD-10-CM

## 2018-09-04 DIAGNOSIS — C3412 Malignant neoplasm of upper lobe, left bronchus or lung: Secondary | ICD-10-CM | POA: Diagnosis not present

## 2018-09-04 DIAGNOSIS — J449 Chronic obstructive pulmonary disease, unspecified: Secondary | ICD-10-CM | POA: Diagnosis not present

## 2018-09-04 DIAGNOSIS — C7951 Secondary malignant neoplasm of bone: Secondary | ICD-10-CM | POA: Diagnosis not present

## 2018-09-04 DIAGNOSIS — K219 Gastro-esophageal reflux disease without esophagitis: Secondary | ICD-10-CM | POA: Diagnosis not present

## 2018-09-04 DIAGNOSIS — M797 Fibromyalgia: Secondary | ICD-10-CM | POA: Diagnosis not present

## 2018-09-04 DIAGNOSIS — Z5111 Encounter for antineoplastic chemotherapy: Secondary | ICD-10-CM | POA: Diagnosis not present

## 2018-09-04 DIAGNOSIS — C787 Secondary malignant neoplasm of liver and intrahepatic bile duct: Secondary | ICD-10-CM | POA: Diagnosis not present

## 2018-09-04 DIAGNOSIS — T792XXA Traumatic secondary and recurrent hemorrhage and seroma, initial encounter: Secondary | ICD-10-CM | POA: Diagnosis not present

## 2018-09-04 DIAGNOSIS — Z79899 Other long term (current) drug therapy: Secondary | ICD-10-CM | POA: Diagnosis not present

## 2018-09-04 DIAGNOSIS — E559 Vitamin D deficiency, unspecified: Secondary | ICD-10-CM | POA: Diagnosis not present

## 2018-09-04 DIAGNOSIS — L539 Erythematous condition, unspecified: Secondary | ICD-10-CM | POA: Diagnosis not present

## 2018-09-04 DIAGNOSIS — E785 Hyperlipidemia, unspecified: Secondary | ICD-10-CM | POA: Diagnosis not present

## 2018-09-04 MED ORDER — SODIUM CHLORIDE 0.9 % IV SOLN
Freq: Once | INTRAVENOUS | Status: AC
  Administered 2018-09-04: 09:00:00 via INTRAVENOUS
  Filled 2018-09-04: qty 250

## 2018-09-04 MED ORDER — DEXAMETHASONE SODIUM PHOSPHATE 10 MG/ML IJ SOLN
INTRAMUSCULAR | Status: AC
  Filled 2018-09-04: qty 1

## 2018-09-04 MED ORDER — SODIUM CHLORIDE 0.9 % IV SOLN
100.0000 mg/m2 | Freq: Once | INTRAVENOUS | Status: AC
  Administered 2018-09-04: 190 mg via INTRAVENOUS
  Filled 2018-09-04: qty 9.5

## 2018-09-04 MED ORDER — SODIUM CHLORIDE 0.9% FLUSH
10.0000 mL | INTRAVENOUS | Status: DC | PRN
  Administered 2018-09-04: 10 mL
  Filled 2018-09-04: qty 10

## 2018-09-04 MED ORDER — DEXAMETHASONE SODIUM PHOSPHATE 10 MG/ML IJ SOLN
10.0000 mg | Freq: Once | INTRAMUSCULAR | Status: AC
  Administered 2018-09-04: 10 mg via INTRAVENOUS

## 2018-09-04 MED ORDER — HEPARIN SOD (PORK) LOCK FLUSH 100 UNIT/ML IV SOLN
500.0000 [IU] | Freq: Once | INTRAVENOUS | Status: AC | PRN
  Administered 2018-09-04: 500 [IU]
  Filled 2018-09-04: qty 5

## 2018-09-04 NOTE — Patient Instructions (Signed)
[facility] Discharge Instructions for Patients Receiving Chemotherapy  Today you received the following chemotherapy agents: etoposide  To help prevent nausea and vomiting after your treatment, we encourage you to take your nausea medication as directed.   If you develop nausea and vomiting that is not controlled by your nausea medication, call the clinic.   BELOW ARE SYMPTOMS THAT SHOULD BE REPORTED IMMEDIATELY:  *FEVER GREATER THAN 100.5 F  *CHILLS WITH OR WITHOUT FEVER  NAUSEA AND VOMITING THAT IS NOT CONTROLLED WITH YOUR NAUSEA MEDICATION  *UNUSUAL SHORTNESS OF BREATH  *UNUSUAL BRUISING OR BLEEDING  TENDERNESS IN MOUTH AND THROAT WITH OR WITHOUT PRESENCE OF ULCERS  *URINARY PROBLEMS  *BOWEL PROBLEMS  UNUSUAL RASH Items with * indicate a potential emergency and should be followed up as soon as possible.  Feel free to call the clinic should you have any questions or concerns. The clinic phone number is [phone].  Please show the CHEMO ALERT CARD at check-in to the Emergency Department and triage nurse.   

## 2018-09-05 ENCOUNTER — Inpatient Hospital Stay: Payer: Medicare Other

## 2018-09-05 VITALS — BP 135/76 | HR 89 | Temp 97.9°F | Resp 18

## 2018-09-05 DIAGNOSIS — T792XXA Traumatic secondary and recurrent hemorrhage and seroma, initial encounter: Secondary | ICD-10-CM | POA: Diagnosis not present

## 2018-09-05 DIAGNOSIS — J449 Chronic obstructive pulmonary disease, unspecified: Secondary | ICD-10-CM | POA: Diagnosis not present

## 2018-09-05 DIAGNOSIS — E785 Hyperlipidemia, unspecified: Secondary | ICD-10-CM | POA: Diagnosis not present

## 2018-09-05 DIAGNOSIS — Z7689 Persons encountering health services in other specified circumstances: Secondary | ICD-10-CM | POA: Diagnosis not present

## 2018-09-05 DIAGNOSIS — I700 Atherosclerosis of aorta: Secondary | ICD-10-CM | POA: Diagnosis not present

## 2018-09-05 DIAGNOSIS — K219 Gastro-esophageal reflux disease without esophagitis: Secondary | ICD-10-CM | POA: Diagnosis not present

## 2018-09-05 DIAGNOSIS — E559 Vitamin D deficiency, unspecified: Secondary | ICD-10-CM | POA: Diagnosis not present

## 2018-09-05 DIAGNOSIS — Z5111 Encounter for antineoplastic chemotherapy: Secondary | ICD-10-CM | POA: Diagnosis not present

## 2018-09-05 DIAGNOSIS — Z79899 Other long term (current) drug therapy: Secondary | ICD-10-CM | POA: Diagnosis not present

## 2018-09-05 DIAGNOSIS — K589 Irritable bowel syndrome without diarrhea: Secondary | ICD-10-CM | POA: Diagnosis not present

## 2018-09-05 DIAGNOSIS — C787 Secondary malignant neoplasm of liver and intrahepatic bile duct: Secondary | ICD-10-CM | POA: Diagnosis not present

## 2018-09-05 DIAGNOSIS — L539 Erythematous condition, unspecified: Secondary | ICD-10-CM | POA: Diagnosis not present

## 2018-09-05 DIAGNOSIS — M797 Fibromyalgia: Secondary | ICD-10-CM | POA: Diagnosis not present

## 2018-09-05 DIAGNOSIS — C3492 Malignant neoplasm of unspecified part of left bronchus or lung: Secondary | ICD-10-CM

## 2018-09-05 DIAGNOSIS — C3412 Malignant neoplasm of upper lobe, left bronchus or lung: Secondary | ICD-10-CM | POA: Diagnosis not present

## 2018-09-05 DIAGNOSIS — C7951 Secondary malignant neoplasm of bone: Secondary | ICD-10-CM | POA: Diagnosis not present

## 2018-09-05 MED ORDER — DEXAMETHASONE SODIUM PHOSPHATE 10 MG/ML IJ SOLN
INTRAMUSCULAR | Status: AC
  Filled 2018-09-05: qty 1

## 2018-09-05 MED ORDER — SODIUM CHLORIDE 0.9 % IV SOLN
100.0000 mg/m2 | Freq: Once | INTRAVENOUS | Status: AC
  Administered 2018-09-05: 190 mg via INTRAVENOUS
  Filled 2018-09-05: qty 9.5

## 2018-09-05 MED ORDER — SODIUM CHLORIDE 0.9% FLUSH
10.0000 mL | INTRAVENOUS | Status: DC | PRN
  Administered 2018-09-05: 10 mL
  Filled 2018-09-05: qty 10

## 2018-09-05 MED ORDER — HEPARIN SOD (PORK) LOCK FLUSH 100 UNIT/ML IV SOLN
500.0000 [IU] | Freq: Once | INTRAVENOUS | Status: AC | PRN
  Administered 2018-09-05: 500 [IU]
  Filled 2018-09-05: qty 5

## 2018-09-05 MED ORDER — DEXAMETHASONE SODIUM PHOSPHATE 10 MG/ML IJ SOLN
10.0000 mg | Freq: Once | INTRAMUSCULAR | Status: AC
  Administered 2018-09-05: 10 mg via INTRAVENOUS

## 2018-09-05 MED ORDER — SODIUM CHLORIDE 0.9 % IV SOLN
Freq: Once | INTRAVENOUS | Status: AC
  Administered 2018-09-05: 08:00:00 via INTRAVENOUS
  Filled 2018-09-05: qty 250

## 2018-09-05 NOTE — Patient Instructions (Signed)
[facility] Discharge Instructions for Patients Receiving Chemotherapy  Today you received the following chemotherapy agents: etoposide  To help prevent nausea and vomiting after your treatment, we encourage you to take your nausea medication as directed.   If you develop nausea and vomiting that is not controlled by your nausea medication, call the clinic.   BELOW ARE SYMPTOMS THAT SHOULD BE REPORTED IMMEDIATELY:  *FEVER GREATER THAN 100.5 F  *CHILLS WITH OR WITHOUT FEVER  NAUSEA AND VOMITING THAT IS NOT CONTROLLED WITH YOUR NAUSEA MEDICATION  *UNUSUAL SHORTNESS OF BREATH  *UNUSUAL BRUISING OR BLEEDING  TENDERNESS IN MOUTH AND THROAT WITH OR WITHOUT PRESENCE OF ULCERS  *URINARY PROBLEMS  *BOWEL PROBLEMS  UNUSUAL RASH Items with * indicate a potential emergency and should be followed up as soon as possible.  Feel free to call the clinic should you have any questions or concerns. The clinic phone number is [phone].  Please show the CHEMO ALERT CARD at check-in to the Emergency Department and triage nurse.   

## 2018-09-07 ENCOUNTER — Inpatient Hospital Stay: Payer: Medicare Other

## 2018-09-07 VITALS — BP 137/76 | HR 18 | Temp 98.3°F | Resp 18

## 2018-09-07 DIAGNOSIS — Z7689 Persons encountering health services in other specified circumstances: Secondary | ICD-10-CM | POA: Diagnosis not present

## 2018-09-07 DIAGNOSIS — C3492 Malignant neoplasm of unspecified part of left bronchus or lung: Secondary | ICD-10-CM

## 2018-09-07 DIAGNOSIS — L539 Erythematous condition, unspecified: Secondary | ICD-10-CM | POA: Diagnosis not present

## 2018-09-07 DIAGNOSIS — T792XXA Traumatic secondary and recurrent hemorrhage and seroma, initial encounter: Secondary | ICD-10-CM | POA: Diagnosis not present

## 2018-09-07 DIAGNOSIS — K219 Gastro-esophageal reflux disease without esophagitis: Secondary | ICD-10-CM | POA: Diagnosis not present

## 2018-09-07 DIAGNOSIS — E559 Vitamin D deficiency, unspecified: Secondary | ICD-10-CM | POA: Diagnosis not present

## 2018-09-07 DIAGNOSIS — Z5111 Encounter for antineoplastic chemotherapy: Secondary | ICD-10-CM | POA: Diagnosis not present

## 2018-09-07 DIAGNOSIS — E785 Hyperlipidemia, unspecified: Secondary | ICD-10-CM | POA: Diagnosis not present

## 2018-09-07 DIAGNOSIS — C787 Secondary malignant neoplasm of liver and intrahepatic bile duct: Secondary | ICD-10-CM | POA: Diagnosis not present

## 2018-09-07 DIAGNOSIS — Z79899 Other long term (current) drug therapy: Secondary | ICD-10-CM | POA: Diagnosis not present

## 2018-09-07 DIAGNOSIS — M797 Fibromyalgia: Secondary | ICD-10-CM | POA: Diagnosis not present

## 2018-09-07 DIAGNOSIS — J449 Chronic obstructive pulmonary disease, unspecified: Secondary | ICD-10-CM | POA: Diagnosis not present

## 2018-09-07 DIAGNOSIS — C7951 Secondary malignant neoplasm of bone: Secondary | ICD-10-CM | POA: Diagnosis not present

## 2018-09-07 DIAGNOSIS — K589 Irritable bowel syndrome without diarrhea: Secondary | ICD-10-CM | POA: Diagnosis not present

## 2018-09-07 DIAGNOSIS — C3412 Malignant neoplasm of upper lobe, left bronchus or lung: Secondary | ICD-10-CM | POA: Diagnosis not present

## 2018-09-07 DIAGNOSIS — I700 Atherosclerosis of aorta: Secondary | ICD-10-CM | POA: Diagnosis not present

## 2018-09-07 MED ORDER — PEGFILGRASTIM-CBQV 6 MG/0.6ML ~~LOC~~ SOSY
PREFILLED_SYRINGE | SUBCUTANEOUS | Status: AC
  Filled 2018-09-07: qty 0.6

## 2018-09-07 MED ORDER — PEGFILGRASTIM-CBQV 6 MG/0.6ML ~~LOC~~ SOSY
6.0000 mg | PREFILLED_SYRINGE | Freq: Once | SUBCUTANEOUS | Status: AC
  Administered 2018-09-07: 6 mg via SUBCUTANEOUS

## 2018-09-09 ENCOUNTER — Inpatient Hospital Stay: Payer: Medicare Other

## 2018-09-09 DIAGNOSIS — L539 Erythematous condition, unspecified: Secondary | ICD-10-CM | POA: Diagnosis not present

## 2018-09-09 DIAGNOSIS — C3412 Malignant neoplasm of upper lobe, left bronchus or lung: Secondary | ICD-10-CM | POA: Diagnosis not present

## 2018-09-09 DIAGNOSIS — Z95828 Presence of other vascular implants and grafts: Secondary | ICD-10-CM

## 2018-09-09 DIAGNOSIS — J449 Chronic obstructive pulmonary disease, unspecified: Secondary | ICD-10-CM | POA: Diagnosis not present

## 2018-09-09 DIAGNOSIS — T792XXA Traumatic secondary and recurrent hemorrhage and seroma, initial encounter: Secondary | ICD-10-CM | POA: Diagnosis not present

## 2018-09-09 DIAGNOSIS — I700 Atherosclerosis of aorta: Secondary | ICD-10-CM | POA: Diagnosis not present

## 2018-09-09 DIAGNOSIS — M797 Fibromyalgia: Secondary | ICD-10-CM | POA: Diagnosis not present

## 2018-09-09 DIAGNOSIS — C7951 Secondary malignant neoplasm of bone: Secondary | ICD-10-CM | POA: Diagnosis not present

## 2018-09-09 DIAGNOSIS — K589 Irritable bowel syndrome without diarrhea: Secondary | ICD-10-CM | POA: Diagnosis not present

## 2018-09-09 DIAGNOSIS — Z7689 Persons encountering health services in other specified circumstances: Secondary | ICD-10-CM | POA: Diagnosis not present

## 2018-09-09 DIAGNOSIS — Z79899 Other long term (current) drug therapy: Secondary | ICD-10-CM | POA: Diagnosis not present

## 2018-09-09 DIAGNOSIS — E559 Vitamin D deficiency, unspecified: Secondary | ICD-10-CM | POA: Diagnosis not present

## 2018-09-09 DIAGNOSIS — C787 Secondary malignant neoplasm of liver and intrahepatic bile duct: Secondary | ICD-10-CM | POA: Diagnosis not present

## 2018-09-09 DIAGNOSIS — Z5111 Encounter for antineoplastic chemotherapy: Secondary | ICD-10-CM | POA: Diagnosis not present

## 2018-09-09 DIAGNOSIS — E785 Hyperlipidemia, unspecified: Secondary | ICD-10-CM | POA: Diagnosis not present

## 2018-09-09 DIAGNOSIS — C3492 Malignant neoplasm of unspecified part of left bronchus or lung: Secondary | ICD-10-CM

## 2018-09-09 DIAGNOSIS — K219 Gastro-esophageal reflux disease without esophagitis: Secondary | ICD-10-CM | POA: Diagnosis not present

## 2018-09-09 LAB — CMP (CANCER CENTER ONLY)
ALT: 30 U/L (ref 0–44)
AST: 49 U/L — ABNORMAL HIGH (ref 15–41)
Albumin: 3.5 g/dL (ref 3.5–5.0)
Alkaline Phosphatase: 249 U/L — ABNORMAL HIGH (ref 38–126)
Anion gap: 9 (ref 5–15)
BILIRUBIN TOTAL: 0.7 mg/dL (ref 0.3–1.2)
BUN: 8 mg/dL (ref 6–20)
CALCIUM: 9.3 mg/dL (ref 8.9–10.3)
CO2: 27 mmol/L (ref 22–32)
CREATININE: 0.61 mg/dL (ref 0.44–1.00)
Chloride: 103 mmol/L (ref 98–111)
Glucose, Bld: 119 mg/dL — ABNORMAL HIGH (ref 70–99)
Potassium: 3.9 mmol/L (ref 3.5–5.1)
SODIUM: 139 mmol/L (ref 135–145)
TOTAL PROTEIN: 7.5 g/dL (ref 6.5–8.1)

## 2018-09-09 LAB — CBC WITH DIFFERENTIAL (CANCER CENTER ONLY)
Basophils Absolute: 0 10*3/uL (ref 0.0–0.1)
Basophils Relative: 0 %
EOS ABS: 0.4 10*3/uL (ref 0.0–0.5)
EOS PCT: 1 %
HCT: 31.8 % — ABNORMAL LOW (ref 36.0–46.0)
HEMOGLOBIN: 10.3 g/dL — AB (ref 12.0–15.0)
LYMPHS ABS: 3.5 10*3/uL (ref 0.7–4.0)
LYMPHS PCT: 8 %
MCH: 31.2 pg (ref 26.0–34.0)
MCHC: 32.4 g/dL (ref 30.0–36.0)
MCV: 96.4 fL (ref 80.0–100.0)
MONO ABS: 0.4 10*3/uL (ref 0.1–1.0)
MONOS PCT: 1 %
Metamyelocytes Relative: 1 %
NRBC: 0 % (ref 0.0–0.2)
Neutro Abs: 38.9 10*3/uL — ABNORMAL HIGH (ref 1.7–17.7)
Neutrophils Relative %: 89 %
Platelet Count: 213 10*3/uL (ref 150–400)
RBC: 3.3 MIL/uL — ABNORMAL LOW (ref 3.87–5.11)
RDW: 21.3 % — ABNORMAL HIGH (ref 11.5–15.5)
WBC Count: 43.2 10*3/uL — ABNORMAL HIGH (ref 4.0–10.5)

## 2018-09-09 MED ORDER — HEPARIN SOD (PORK) LOCK FLUSH 100 UNIT/ML IV SOLN
500.0000 [IU] | Freq: Once | INTRAVENOUS | Status: AC
  Administered 2018-09-09: 500 [IU] via INTRAVENOUS
  Filled 2018-09-09: qty 5

## 2018-09-09 MED ORDER — SODIUM CHLORIDE 0.9% FLUSH
10.0000 mL | INTRAVENOUS | Status: DC | PRN
  Administered 2018-09-09: 10 mL via INTRAVENOUS
  Filled 2018-09-09: qty 10

## 2018-09-10 ENCOUNTER — Encounter: Payer: Self-pay | Admitting: Family Medicine

## 2018-09-12 ENCOUNTER — Inpatient Hospital Stay (HOSPITAL_BASED_OUTPATIENT_CLINIC_OR_DEPARTMENT_OTHER): Payer: Medicare Other | Admitting: Medical

## 2018-09-12 ENCOUNTER — Telehealth: Payer: Self-pay | Admitting: *Deleted

## 2018-09-12 ENCOUNTER — Telehealth: Payer: Self-pay | Admitting: Medical

## 2018-09-12 DIAGNOSIS — C3412 Malignant neoplasm of upper lobe, left bronchus or lung: Secondary | ICD-10-CM | POA: Diagnosis not present

## 2018-09-12 DIAGNOSIS — Z5111 Encounter for antineoplastic chemotherapy: Secondary | ICD-10-CM | POA: Diagnosis not present

## 2018-09-12 DIAGNOSIS — C787 Secondary malignant neoplasm of liver and intrahepatic bile duct: Secondary | ICD-10-CM

## 2018-09-12 DIAGNOSIS — C7951 Secondary malignant neoplasm of bone: Secondary | ICD-10-CM

## 2018-09-12 DIAGNOSIS — Z79899 Other long term (current) drug therapy: Secondary | ICD-10-CM | POA: Diagnosis not present

## 2018-09-12 DIAGNOSIS — T792XXA Traumatic secondary and recurrent hemorrhage and seroma, initial encounter: Secondary | ICD-10-CM | POA: Diagnosis not present

## 2018-09-12 DIAGNOSIS — L539 Erythematous condition, unspecified: Secondary | ICD-10-CM

## 2018-09-12 DIAGNOSIS — K589 Irritable bowel syndrome without diarrhea: Secondary | ICD-10-CM | POA: Diagnosis not present

## 2018-09-12 DIAGNOSIS — J449 Chronic obstructive pulmonary disease, unspecified: Secondary | ICD-10-CM | POA: Diagnosis not present

## 2018-09-12 DIAGNOSIS — E559 Vitamin D deficiency, unspecified: Secondary | ICD-10-CM | POA: Diagnosis not present

## 2018-09-12 DIAGNOSIS — Z7689 Persons encountering health services in other specified circumstances: Secondary | ICD-10-CM | POA: Diagnosis not present

## 2018-09-12 DIAGNOSIS — C3492 Malignant neoplasm of unspecified part of left bronchus or lung: Secondary | ICD-10-CM

## 2018-09-12 DIAGNOSIS — K219 Gastro-esophageal reflux disease without esophagitis: Secondary | ICD-10-CM | POA: Diagnosis not present

## 2018-09-12 DIAGNOSIS — E785 Hyperlipidemia, unspecified: Secondary | ICD-10-CM | POA: Diagnosis not present

## 2018-09-12 DIAGNOSIS — M797 Fibromyalgia: Secondary | ICD-10-CM | POA: Diagnosis not present

## 2018-09-12 DIAGNOSIS — I700 Atherosclerosis of aorta: Secondary | ICD-10-CM | POA: Diagnosis not present

## 2018-09-12 MED ORDER — CEPHALEXIN 500 MG PO CAPS: 500.0000 mg | ORAL_CAPSULE | Freq: Three times a day (TID) | ORAL | 0 refills | Status: DC

## 2018-09-12 NOTE — Telephone Encounter (Signed)
Pt called states my pac is red there is a yellow color liquid draining out of it where the incision is, it's tender to touch.  Pt denied fever/chills.  Last treatment  09/04/15/17 D1/C3 Tecentriq/carbo/VP 10/18 [facility] Message to [facility] for PAC to be evaluated.

## 2018-09-12 NOTE — Patient Instructions (Signed)
Implanted Port Home Guide An implanted port is a type of central line that is placed under the skin. Central lines are used to provide IV access when treatment or nutrition needs to be given through a person's veins. Implanted ports are used for long-term IV access. An implanted port may be placed because:  You need IV medicine that would be irritating to the small veins in your hands or arms.  You need long-term IV medicines, such as antibiotics.  You need IV nutrition for a long period.  You need frequent blood draws for lab tests.  You need dialysis.  Implanted ports are usually placed in the chest area, but they can also be placed in the upper arm, the abdomen, or the leg. An implanted port has two main parts:  Reservoir. The reservoir is round and will appear as a small, raised area under your skin. The reservoir is the part where a needle is inserted to give medicines or draw blood.  Catheter. The catheter is a thin, flexible tube that extends from the reservoir. The catheter is placed into a large vein. Medicine that is inserted into the reservoir goes into the catheter and then into the vein.  How will I care for my incision site? Do not get the incision site wet. Bathe or shower as directed by your health care provider. How is my port accessed? Special steps must be taken to access the port:  Before the port is accessed, a numbing cream can be placed on the skin. This helps numb the skin over the port site.  Your health care provider uses a sterile technique to access the port. ? Your health care provider must put on a mask and sterile gloves. ? The skin over your port is cleaned carefully with an antiseptic and allowed to dry. ? The port is gently pinched between sterile gloves, and a needle is inserted into the port.  Only "non-coring" port needles should be used to access the port. Once the port is accessed, a blood return should be checked. This helps ensure that the port  is in the vein and is not clogged.  If your port needs to remain accessed for a constant infusion, a clear (transparent) bandage will be placed over the needle site. The bandage and needle will need to be changed every week, or as directed by your health care provider.  Keep the bandage covering the needle clean and dry. Do not get it wet. Follow your health care provider's instructions on how to take a shower or bath while the port is accessed.  If your port does not need to stay accessed, no bandage is needed over the port.  What is flushing? Flushing helps keep the port from getting clogged. Follow your health care provider's instructions on how and when to flush the port. Ports are usually flushed with saline solution or a medicine called heparin. The need for flushing will depend on how the port is used.  If the port is used for intermittent medicines or blood draws, the port will need to be flushed: ? After medicines have been given. ? After blood has been drawn. ? As part of routine maintenance.  If a constant infusion is running, the port may not need to be flushed.  How long will my port stay implanted? The port can stay in for as long as your health care provider thinks it is needed. When it is time for the port to come out, surgery will be   done to remove it. The procedure is similar to the one performed when the port was put in. When should I seek immediate medical care? When you have an implanted port, you should seek immediate medical care if:  You notice a bad smell coming from the incision site.  You have swelling, redness, or drainage at the incision site.  You have more swelling or pain at the port site or the surrounding area.  You have a fever that is not controlled with medicine.  This information is not intended to replace advice given to you by your health care provider. Make sure you discuss any questions you have with your health care provider. Document  Released: 11/06/2005 Document Revised: 04/13/2016 Document Reviewed: 07/14/2013 [employer] Interactive Patient Education  2017 [employer]  

## 2018-09-12 NOTE — Telephone Encounter (Signed)
Spoke to pt regarding appts per 10/24 sch message

## 2018-09-13 ENCOUNTER — Other Ambulatory Visit: Payer: Self-pay | Admitting: Oncology

## 2018-09-16 ENCOUNTER — Inpatient Hospital Stay: Payer: Medicare Other

## 2018-09-16 DIAGNOSIS — M797 Fibromyalgia: Secondary | ICD-10-CM | POA: Diagnosis not present

## 2018-09-16 DIAGNOSIS — Z95828 Presence of other vascular implants and grafts: Secondary | ICD-10-CM | POA: Insufficient documentation

## 2018-09-16 DIAGNOSIS — L539 Erythematous condition, unspecified: Secondary | ICD-10-CM | POA: Diagnosis not present

## 2018-09-16 DIAGNOSIS — E559 Vitamin D deficiency, unspecified: Secondary | ICD-10-CM | POA: Diagnosis not present

## 2018-09-16 DIAGNOSIS — Z7689 Persons encountering health services in other specified circumstances: Secondary | ICD-10-CM | POA: Diagnosis not present

## 2018-09-16 DIAGNOSIS — I700 Atherosclerosis of aorta: Secondary | ICD-10-CM | POA: Diagnosis not present

## 2018-09-16 DIAGNOSIS — C7951 Secondary malignant neoplasm of bone: Secondary | ICD-10-CM | POA: Diagnosis not present

## 2018-09-16 DIAGNOSIS — C3412 Malignant neoplasm of upper lobe, left bronchus or lung: Secondary | ICD-10-CM | POA: Diagnosis not present

## 2018-09-16 DIAGNOSIS — Z79899 Other long term (current) drug therapy: Secondary | ICD-10-CM | POA: Diagnosis not present

## 2018-09-16 DIAGNOSIS — T792XXA Traumatic secondary and recurrent hemorrhage and seroma, initial encounter: Secondary | ICD-10-CM | POA: Diagnosis not present

## 2018-09-16 DIAGNOSIS — Z5111 Encounter for antineoplastic chemotherapy: Secondary | ICD-10-CM | POA: Diagnosis not present

## 2018-09-16 DIAGNOSIS — C787 Secondary malignant neoplasm of liver and intrahepatic bile duct: Secondary | ICD-10-CM | POA: Diagnosis not present

## 2018-09-16 DIAGNOSIS — J449 Chronic obstructive pulmonary disease, unspecified: Secondary | ICD-10-CM | POA: Diagnosis not present

## 2018-09-16 DIAGNOSIS — C3492 Malignant neoplasm of unspecified part of left bronchus or lung: Secondary | ICD-10-CM

## 2018-09-16 DIAGNOSIS — E785 Hyperlipidemia, unspecified: Secondary | ICD-10-CM | POA: Diagnosis not present

## 2018-09-16 DIAGNOSIS — K219 Gastro-esophageal reflux disease without esophagitis: Secondary | ICD-10-CM | POA: Diagnosis not present

## 2018-09-16 DIAGNOSIS — K589 Irritable bowel syndrome without diarrhea: Secondary | ICD-10-CM | POA: Diagnosis not present

## 2018-09-16 LAB — CMP (CANCER CENTER ONLY)
ALK PHOS: 265 U/L — AB (ref 38–126)
ALT: 27 U/L (ref 0–44)
AST: 44 U/L — ABNORMAL HIGH (ref 15–41)
Albumin: 3.4 g/dL — ABNORMAL LOW (ref 3.5–5.0)
Anion gap: 8 (ref 5–15)
BILIRUBIN TOTAL: 0.3 mg/dL (ref 0.3–1.2)
BUN: 6 mg/dL (ref 6–20)
CHLORIDE: 104 mmol/L (ref 98–111)
CO2: 29 mmol/L (ref 22–32)
Calcium: 9.4 mg/dL (ref 8.9–10.3)
Creatinine: 0.62 mg/dL (ref 0.44–1.00)
GFR, Est AFR Am: >60 mL/min (ref >60)
GFR, Estimated: >60 mL/min (ref >60)
GLUCOSE: 107 mg/dL — AB (ref 70–99)
Potassium: 3.5 mmol/L (ref 3.5–5.1)
Sodium: 141 mmol/L (ref 135–145)
Total Protein: 7.4 g/dL (ref 6.5–8.1)

## 2018-09-16 LAB — CBC WITH DIFFERENTIAL (CANCER CENTER ONLY)
Abs Immature Granulocytes: 0.12 10*3/uL — ABNORMAL HIGH (ref 0.00–0.07)
BASOS ABS: 0.1 10*3/uL (ref 0.0–0.1)
Basophils Relative: 1 %
EOS ABS: 0.2 10*3/uL (ref 0.0–0.5)
EOS PCT: 3 %
HCT: 32.9 % — ABNORMAL LOW (ref 36.0–46.0)
HEMOGLOBIN: 10.6 g/dL — AB (ref 12.0–15.0)
Immature Granulocytes: 1 %
LYMPHS ABS: 2.4 10*3/uL (ref 0.7–4.0)
LYMPHS PCT: 26 %
MCH: 31.3 pg (ref 26.0–34.0)
MCHC: 32.2 g/dL (ref 30.0–36.0)
MCV: 97.1 fL (ref 80.0–100.0)
MONO ABS: 1.2 10*3/uL — AB (ref 0.1–1.0)
Monocytes Relative: 13 %
NRBC: 0 % (ref 0.0–0.2)
Neutro Abs: 5.3 10*3/uL (ref 1.7–7.7)
Neutrophils Relative %: 56 %
Platelet Count: 99 10*3/uL — ABNORMAL LOW (ref 150–400)
RBC: 3.39 MIL/uL — ABNORMAL LOW (ref 3.87–5.11)
RDW: 21.2 % — ABNORMAL HIGH (ref 11.5–15.5)
WBC: 9.3 10*3/uL (ref 4.0–10.5)

## 2018-09-16 MED ORDER — SULFAMETHOXAZOLE-TRIMETHOPRIM 800-160 MG PO TABS: 1.0000 | ORAL_TABLET | Freq: Two times a day (BID) | ORAL | 0 refills | Status: DC

## 2018-09-16 MED ORDER — HEPARIN SOD (PORK) LOCK FLUSH 100 UNIT/ML IV SOLN
500.0000 [IU] | Freq: Once | INTRAVENOUS | Status: AC | PRN
  Administered 2018-09-16: 500 [IU]
  Filled 2018-09-16: qty 5

## 2018-09-16 MED ORDER — SODIUM CHLORIDE 0.9% FLUSH
10.0000 mL | INTRAVENOUS | Status: DC | PRN
  Administered 2018-09-16: 10 mL
  Filled 2018-09-16: qty 10

## 2018-09-16 NOTE — Progress Notes (Signed)
[facility] Progress Note   [name] [id number] 03/31/1956 62 y.o.    [name] is managed by Dr. Fanny Bien. Mohamed  Actively treated with chemotherapy/immunotherapy: yes  Current Therapy: Carboplatin, etoposide, and Tecentriq (Atezolizumab) every 3 weeks with Neulasta support.  Last Treated: 09/07/2018 (cycle 3, day 5)  Assessment: Plan:    Post-traumatic seroma ([place]) - Plan: cephALEXin (KEFLEX) 500 MG capsule  Erythema - Plan: cephALEXin (KEFLEX) 500 MG capsule  SCLC (small cell lung carcinoma), left (HCC)   Seroma with erythema immediately superior to a right chest wall Port-A-Cath: It appears that the patient's port had developed a seroma which drained.  There is slight erythema surrounding the port.  Empirically the patient has been treated with Keflex 500 mg p.o. 3 times daily x7 days.  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  The patient is status post cycle 3 of carboplatin, etoposide, and Tecentriq with Neulasta support.  She will see Dr. Julien Nordmann in follow-up on 09/23/2018.  Please see After Visit Summary for patient specific instructions.  Future Appointments  Date Time Provider [place]  09/18/2018  8:30 AM [name], MD [facility] None  09/23/2018  8:00 AM [facility] ONLY [facility] None  09/23/2018  8:15 AM [facility] [place] FLUSH [facility] None  09/23/2018  9:00 AM Curt Bears, MD [facility] None  09/23/2018 10:00 AM [facility] INFUSION [facility] None  09/24/2018  8:30 AM [facility] INFUSION [facility] None  09/25/2018  8:30 AM [facility] INFUSION [facility] None  09/27/2018  9:00 AM [facility] [place] FLUSH [facility] None  09/30/2018  8:00 AM [facility] LAB 2 [facility] None  09/30/2018  8:15 AM [facility] [place] FLUSH [facility] None  10/07/2018  8:00 AM [facility] LAB 2 [facility] None  10/07/2018  8:15 AM [facility] [place] FLUSH [facility] None  10/08/2018 10:30 AM Young, Clinton D, MD [facility] None    10/14/2018  8:00 AM [facility] LAB 2 [facility] None  10/14/2018  8:15 AM [facility] [place] FLUSH [facility] None  10/14/2018  9:00 AM Curt Bears, MD [facility] None  10/14/2018 10:00 AM [facility] INFUSION [facility] None  10/15/2018  8:00 AM [facility] INFUSION [facility] None  10/16/2018  8:00 AM [facility] INFUSION [facility] None  10/21/2018  8:00 AM [facility] LAB 5 [facility] None  10/21/2018  8:15 AM [facility] [place] FLUSH [facility] None  10/28/2018  8:00 AM [facility] LAB 5 [facility] None  10/28/2018  8:15 AM [facility] [place] FLUSH [facility] None  11/04/2018  7:45 AM [facility] LAB 5 [facility] None  11/04/2018  8:00 AM [facility] [place] FLUSH [facility] None  11/04/2018  8:30 AM Curt Bears, MD [facility] None  11/04/2018  9:30 AM [facility] INFUSION [facility] None  11/05/2018  8:00 AM [facility] INFUSION [facility] None  11/06/2018  8:00 AM [facility] INFUSION [facility] None  11/08/2018 10:00 AM [facility] [place] FLUSH [facility] None    No orders of the defined types were placed in this encounter.      Subjective:   Patient ID:  [name] is a 62 y.o. (DOB 1956-05-13) female.  Chief Complaint:  Chief Complaint  Patient presents with  . Vascular Access Problem    HPI [name] is a 62 year old female with a history of an extensive stage small cell carcinoma of the lung who is managed by Dr. Fanny Bien. Mohamed.  She is status post cycle 3 of carboplatin, etoposide, and Tecentriq with Neulasta support.  She completed the cycle on 09/07/2018.  She presents to the clinic today for a port flush.  She reports having a bloody and watery drainage from the medial edge of her port scar yesterday.  She is also having mild erythema around the site of her port but denies purulent drainage, fevers, chills, or sweats.  Her labs from 09/09/2018 showed a WBC of 43.2 with an ANC of 38.9.  Of note received Neulasta on 09/07/2018.  Medications: I have reviewed the  patient's current medications.  Allergies:  Allergies  Allergen Reactions  . Penicillins Nausea And Vomiting    Has patient had a PCN reaction causing immediate rash, facial/tongue/throat swelling, SOB or lightheadedness with hypotension: Unknown Has patient had a PCN reaction causing severe rash involving mucus membranes or skin necrosis: Unknown Has patient had a PCN reaction that required hospitalization: Unknown Has patient had a PCN reaction occurring within the last 10 years: Unknown If all of the above answers are "NO", then may proceed with Cephalosporin use.   Marland Kitchen Actonel [Risedronate Sodium]     Caused her to retain fluid and stomach problems  . Advair Diskus [Fluticasone-Salmeterol]   . Alendronate Sodium     Stomach upset / esophageal burning  . Aspirin Nausea Only    Stomach burns; abdominal pain  . Azithromycin Nausea And Vomiting  . Keflex [Cephalexin] Itching  . Levofloxacin     Reports hx of GI distress and GI bleed  . Morphine Itching and Swelling    Throat swells  . Nsaids     Swelling, GI upset   . Omnicef [Cefdinir]   . Erythromycin Rash  . Iodine Rash    Topical and IV    Past Medical History:  Diagnosis Date  . Alpha-1-antitrypsin deficiency ([place])   . Cervical cancer ([place]) 2004  . 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 This doesn't fit entirely with emphysema.      . Exposure to hepatitis C 09/12/2016  . Fibromyalgia   . GERD (gastroesophageal reflux disease)   . Hyperlipidemia   . IBS (irritable bowel syndrome)   . Obstructive sleep apnea 06/02/2008   NPSG 06/07/08, AHI 6.9/ hr, weight 220 lbs    . Osteoporosis   . Pulmonary fibrosis, unspecified ([place]) 09/29/2016   CXR 03/28/2016  . Rhinitis, nonallergic 05/08/2012  . 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 Vitamin D deficiency     Past Surgical History:  Procedure Laterality Date  . ABDOMINAL HYSTERECTOMY    . IR IMAGING GUIDED PORT INSERTION  08/02/2018  . LUMBAR DISC SURGERY    . VIDEO BRONCHOSCOPY Bilateral 07/10/2018   Procedure: VIDEO BRONCHOSCOPY WITHOUT FLUORO;  Surgeon: Laurin Coder, MD;  Location: [facility];  Service: Cardiopulmonary;  Laterality: Bilateral;    Family History  Problem Relation Age of Onset  . Alpha-1 antitrypsin deficiency Sister   . Osteoporosis Sister   . Pancreatic cancer Mother 56  . Osteoporosis Mother   . Irregular heart beat Mother   . Heart attack Father 51       Died age 67  . Migraines Father   . CAD Sister 41       Died age 76  . Osteoporosis Sister   . Lung cancer Brother   . CAD Brother 93       CABG.  Died from leukemia  . Leukemia Brother   . Heart attack Brother   .  Alpha-1 antitrypsin deficiency Other   . Heart disease Brother   . Colon cancer Neg Hx     Social History   Socioeconomic History  . Marital status: Married    Spouse name: Audry Pili  . Number of children: 2  . Years of education: Not on file  . Highest education level: Not on file  Occupational History  . Occupation: disabled    Fish farm manager: UNEMPLOYED  Social Needs  . Financial resource strain: Not hard at all  . Food insecurity:    Worry: Never true    Inability: Never true  . Transportation needs:    Medical: No    Non-medical: No  Tobacco Use  . Smoking status: Former Smoker    Packs/day: 0.50    Types: Cigarettes    Start date: 11/20/1972    Last attempt to quit: 04/01/2012    Years since quitting: 6.4  . Smokeless tobacco: Never Used  Substance and Sexual Activity  . Alcohol use: No  . Drug use: No  . Sexual activity: Yes  Lifestyle  . Physical activity:    Days per week: 5 days    Minutes per session: 30 min  . Stress: Not at all  Relationships  . Social connections:    Talks on phone: More than three times a week     Gets together: More than three times a week    Attends religious service: More than 4 times per year    Active member of club or organization: No    Attends meetings of clubs or organizations: Never    Relationship status: Not on file  . Intimate partner violence:    Fear of current or ex partner: No    Emotionally abused: No    Physically abused: No    Forced sexual activity: No  Other Topics Concern  . Not on file  Social History Narrative   Lives at home with husband.     Past Medical History, Surgical history, Social history, and Family history were reviewed and updated as appropriate.   Please see review of systems for further details on the patient's review from today.   Review of Systems:  Review of Systems  Constitutional: Negative for chills, diaphoresis and fever.  HENT: Negative for trouble swallowing and voice change.   Respiratory: Negative for cough, chest tightness, shortness of breath and wheezing.   Cardiovascular: Negative for chest pain and palpitations.  Gastrointestinal: Negative for abdominal pain, constipation, diarrhea, nausea and vomiting.  Musculoskeletal: Negative for back pain and myalgias.  Skin:       Bloody and watery drainage from the medial edge of the port scar in the right chest.  Mild erythema over the upper port with no increase in warmth.  Neurological: Negative for dizziness, light-headedness and headaches.    Objective:   Physical Exam:  There were no vitals taken for this visit. ECOG: 0  Physical Exam  Constitutional: No distress.  HENT:  Head: Normocephalic and atraumatic.  Eyes: Right eye exhibits no discharge. Left eye exhibits no discharge. No scleral icterus.  Neck: Normal range of motion. Neck supple.  Cardiovascular: Normal rate, regular rhythm and normal heart sounds. Exam reveals no gallop and no friction rub.  No murmur heard. Pulmonary/Chest: Effort normal and breath sounds normal. No respiratory distress. She has no  wheezes. She has no rales.  Musculoskeletal: She exhibits no edema.  Lymphadenopathy:    She has no cervical adenopathy.  Neurological: She is alert. Coordination normal.  Skin: Skin is warm and dry. No rash noted. She is not diaphoretic. No erythema.  A pinpoint opening is noted in the left medial chest port incision.  There is mild erythema surrounding the upper portion of the chest port without exudate or warmth noted.    Lab Review:     Component Value Date/Time   NA 141 09/16/2018 0740   NA 124 (L) 07/16/2018 0853   K 3.5 09/16/2018 0740   CL 104 09/16/2018 0740   CO2 29 09/16/2018 0740   GLUCOSE 107 (H) 09/16/2018 0740   BUN 6 09/16/2018 0740   BUN 8 07/16/2018 0853   CREATININE 0.62 09/16/2018 0740   CREATININE 0.68 02/13/2013 1523   CALCIUM 9.4 09/16/2018 0740   PROT 7.4 09/16/2018 0740   PROT 6.5 07/16/2018 0853   ALBUMIN 3.4 (L) 09/16/2018 0740   ALBUMIN 3.1 (L) 07/16/2018 0853   AST 44 (H) 09/16/2018 0740   ALT 27 09/16/2018 0740   ALKPHOS 265 (H) 09/16/2018 0740   BILITOT 0.3 09/16/2018 0740   GFRNONAA >60 09/16/2018 0740   GFRNONAA >89 02/13/2013 1523   GFRAA >60 09/16/2018 0740   GFRAA >89 02/13/2013 1523       Component Value Date/Time   WBC 9.3 09/16/2018 0740   WBC 14.3 (H) 07/19/2018 0355   RBC 3.39 (L) 09/16/2018 0740   HGB 10.6 (L) 09/16/2018 0740   HGB 13.0 07/16/2018 0853   HCT 32.9 (L) 09/16/2018 0740   HCT 39.9 07/16/2018 0853   PLT 99 (L) 09/16/2018 0740   PLT 473 (H) 07/16/2018 0853   MCV 97.1 09/16/2018 0740   MCV 85 07/16/2018 0853   MCH 31.3 09/16/2018 0740   MCHC 32.2 09/16/2018 0740   RDW 21.2 (H) 09/16/2018 0740   RDW 15.7 (H) 07/16/2018 0853   LYMPHSABS 2.4 09/16/2018 0740   LYMPHSABS 2.2 07/16/2018 0853   MONOABS 1.2 (H) 09/16/2018 0740   EOSABS 0.2 09/16/2018 0740   EOSABS 0.0 07/16/2018 0853   BASOSABS 0.1 09/16/2018 0740   BASOSABS 0.1 07/16/2018 0853   -------------------------------  Imaging from last 24 hours (if  applicable):  Radiology interpretation: Ct Chest W Contrast  Result Date: 08/26/2018 CLINICAL DATA:  Small cell lung cancer diagnosed September 2019. Chemotherapy ongoing. Remote history of cervical cancer. Hysterectomy. EXAM: CT CHEST, ABDOMEN, AND PELVIS WITH CONTRAST TECHNIQUE: Multidetector CT imaging of the chest, abdomen and pelvis was performed following the standard protocol during bolus administration of intravenous contrast. CONTRAST:  135mL OMNIPAQUE IOHEXOL 300 MG/ML  SOLN COMPARISON:  PET-CT 07/25/2018, CT abdomen 07/06/2018, ultrasound 07/19/2018 FINDINGS: CT CHEST FINDINGS Cardiovascular: Coronary artery calcification and aortic atherosclerotic calcification. Port in the anterior chest wall with tip in distal SVC. Mediastinum/Nodes: No axillary supraclavicular adenopathy. No clear mediastinal hilar adenopathy. No pericardial effusion. Esophagus normal. In comparison to PET-CT 07/25/2018, marked reduction in the bulky AP window and LEFT hilar adenopathy. Lungs/Pleura: Marked improvement in consolidative pattern in the LEFT lower lobe. Marked improvement in the LEFT infrahilar mass obstructing the LEFT lobe bronchus. There is still mild narrowing of the bronchus (image 27/2) but there is now near complete re-expansion of the obstructed LEFT lower lobe. Mild atelectasis remains. Cluster of new nodules in the RIGHT lower lobe (image 36/2). This clustered pattern suggest a focus of infection or inflammation. Resolution of the patchy nodules in the RIGHT upper lobe seen on comparison PET-CT scan Musculoskeletal: Smudgy round sclerotic lesion at T3, T5 and T10 consistent with skeletal metastasis. These lesions were not hypermetabolic on  comparison PET-CT scan and not readily evident on the CT portion exam. CT ABDOMEN AND PELVIS FINDINGS Hepatobiliary: Liver has a fine nodular contour. Multiple low-density lesions throughout the LEFT RIGHT hepatic lobe are too numerous to count. Lesion were  hypermetabolic on comparison PET-CT scan. Gallbladder is distended but not inflamed. Common bile duct normal. Pancreas: Pancreas is normal. No ductal dilatation. No pancreatic inflammation. Spleen: Normal spleen Adrenals/urinary tract: Adrenal glands are normal. The inferior pole of the RIGHT kidney there is a 2 cm low-density lesion (image 29/3) which cannot be fully characterize. Second more subtle region of hypoperfusion/enhancement in the mid RIGHT kidney (image 22/7). The ureters and bladder normal. Stomach/Bowel: Stomach, small bowel, appendix, and cecum are normal. The colon and rectosigmoid colon are normal. Vascular/Lymphatic: Abdominal aorta is normal caliber with atherosclerotic calcification. There is no retroperitoneal or periportal lymphadenopathy. No pelvic lymphadenopathy. Reproductive: Post hysterectomy Other: No peritoneal metastasis Musculoskeletal: New sclerotic lesion at L3 and L1 (sagittal image 84/6) IMPRESSION: Chest Impression: 1. Marked improvement in the LEFT infrahilar mass and re-expansion of the LEFT lower lobe. 2. Marked improvement in mediastinal lymphadenopathy. 3. Resolution in the patchy nodules in the RIGHT upper lobe similar comparison exam. New nodularity in the RIGHT lower lobe is favored infectious. Recommend attention on follow-up. 4. 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 Abdomen / Pelvis Impression: 1. Multiple low-density lesions in liver are suspicious for metastasis. 2. Cirrhotic liver. 3. Low-density lesions in the cortex of the RIGHT kidney with differential including focal pyelonephritis versus malignancy versus. Renal MRI could add specificity as warranted. Recommend clinical correlation for pyelonephritis. 4. 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 These new skeletal metastasis are sclerotic and not evident on comparison PET-CT scan. No hypermetabolic lesions present on comparison PET-CT scan. Presumably visualization of the lesions could  relate to chemotherapy versus de Novo lesions. Electronically Signed   By: Suzy Bouchard M.D.   On: 08/26/2018 12:25   Ct Abdomen Pelvis W Contrast  Result Date: 08/26/2018 CLINICAL DATA:  Small cell lung cancer diagnosed September 2019. Chemotherapy ongoing. Remote history of cervical cancer. Hysterectomy. EXAM: CT CHEST, ABDOMEN, AND PELVIS WITH CONTRAST TECHNIQUE: Multidetector CT imaging of the chest, abdomen and pelvis was performed following the standard protocol during bolus administration of intravenous contrast. CONTRAST:  162mL OMNIPAQUE IOHEXOL 300 MG/ML  SOLN COMPARISON:  PET-CT 07/25/2018, CT abdomen 07/06/2018, ultrasound 07/19/2018 FINDINGS: CT CHEST FINDINGS Cardiovascular: Coronary artery calcification and aortic atherosclerotic calcification. Port in the anterior chest wall with tip in distal SVC. Mediastinum/Nodes: No axillary supraclavicular adenopathy. No clear mediastinal hilar adenopathy. No pericardial effusion. Esophagus normal. In comparison to PET-CT 07/25/2018, marked reduction in the bulky AP window and LEFT hilar adenopathy. Lungs/Pleura: Marked improvement in consolidative pattern in the LEFT lower lobe. Marked improvement in the LEFT infrahilar mass obstructing the LEFT lobe bronchus. There is still mild narrowing of the bronchus (image 27/2) but there is now near complete re-expansion of the obstructed LEFT lower lobe. Mild atelectasis remains. Cluster of new nodules in the RIGHT lower lobe (image 36/2). This clustered pattern suggest a focus of infection or inflammation. Resolution of the patchy nodules in the RIGHT upper lobe seen on comparison PET-CT scan Musculoskeletal: Smudgy round sclerotic lesion at T3, T5 and T10 consistent with skeletal metastasis. These lesions were not hypermetabolic on comparison PET-CT scan and not readily evident on the CT portion exam. CT ABDOMEN AND PELVIS FINDINGS Hepatobiliary: Liver has a fine nodular contour. Multiple low-density lesions  throughout the LEFT RIGHT  hepatic lobe are too numerous to count. Lesion were hypermetabolic on comparison PET-CT scan. Gallbladder is distended but not inflamed. Common bile duct normal. Pancreas: Pancreas is normal. No ductal dilatation. No pancreatic inflammation. Spleen: Normal spleen Adrenals/urinary tract: Adrenal glands are normal. The inferior pole of the RIGHT kidney there is a 2 cm low-density lesion (image 29/3) which cannot be fully characterize. Second more subtle region of hypoperfusion/enhancement in the mid RIGHT kidney (image 22/7). The ureters and bladder normal. Stomach/Bowel: Stomach, small bowel, appendix, and cecum are normal. The colon and rectosigmoid colon are normal. Vascular/Lymphatic: Abdominal aorta is normal caliber with atherosclerotic calcification. There is no retroperitoneal or periportal lymphadenopathy. No pelvic lymphadenopathy. Reproductive: Post hysterectomy Other: No peritoneal metastasis Musculoskeletal: New sclerotic lesion at L3 and L1 (sagittal image 84/6) IMPRESSION: Chest Impression: 1. Marked improvement in the LEFT infrahilar mass and re-expansion of the LEFT lower lobe. 2. Marked improvement in mediastinal lymphadenopathy. 3. Resolution in the patchy nodules in the RIGHT upper lobe similar comparison exam. New nodularity in the RIGHT lower lobe is favored infectious. Recommend attention on follow-up. 4. 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 Abdomen / Pelvis Impression: 1. Multiple low-density lesions in liver are suspicious for metastasis. 2. Cirrhotic liver. 3. Low-density lesions in the cortex of the RIGHT kidney with differential including focal pyelonephritis versus malignancy versus. Renal MRI could add specificity as warranted. Recommend clinical correlation for pyelonephritis. 4. 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 These new skeletal metastasis are sclerotic and not evident on comparison PET-CT scan. No hypermetabolic lesions present  on comparison PET-CT scan. Presumably visualization of the lesions could relate to chemotherapy versus de Novo lesions. Electronically Signed   By: Suzy Bouchard M.D.   On: 08/26/2018 12:25        This case was discussed with Dr. Julien Nordmann. He expressed agreement with my management of this patient.

## 2018-09-18 ENCOUNTER — Ambulatory Visit (INDEPENDENT_AMBULATORY_CARE_PROVIDER_SITE_OTHER): Payer: Medicare Other | Admitting: Family Medicine

## 2018-09-18 ENCOUNTER — Encounter: Payer: Self-pay | Admitting: Family Medicine

## 2018-09-18 VITALS — BP 117/72 | HR 91 | Temp 97.1°F | Ht 65.0 in | Wt 181.0 lb

## 2018-09-18 DIAGNOSIS — E559 Vitamin D deficiency, unspecified: Secondary | ICD-10-CM

## 2018-09-18 DIAGNOSIS — M797 Fibromyalgia: Secondary | ICD-10-CM

## 2018-09-18 DIAGNOSIS — C3492 Malignant neoplasm of unspecified part of left bronchus or lung: Secondary | ICD-10-CM | POA: Diagnosis not present

## 2018-09-18 DIAGNOSIS — I700 Atherosclerosis of aorta: Secondary | ICD-10-CM | POA: Diagnosis not present

## 2018-09-18 DIAGNOSIS — Z23 Encounter for immunization: Secondary | ICD-10-CM

## 2018-09-18 DIAGNOSIS — E7800 Pure hypercholesterolemia, unspecified: Secondary | ICD-10-CM | POA: Diagnosis not present

## 2018-09-18 NOTE — Patient Instructions (Addendum)
Medicare Annual Wellness Visit  [facility] and the medical providers at [facility] strive to bring you the best medical care.  In doing so we not only want to address your current medical conditions and concerns but also to detect new conditions early and prevent illness, disease and health-related problems.    Medicare offers a yearly Wellness Visit which allows our clinical staff to assess your need for preventative services including immunizations, lifestyle education, counseling to decrease risk of preventable diseases and screening for fall risk and other medical concerns.    This visit is provided free of charge (no copay) for all Medicare recipients. The clinical pharmacists at [facility] have begun to conduct these Wellness Visits which will also include a thorough review of all your medications.    As you primary medical provider recommend that you make an appointment for your Annual Wellness Visit if you have not done so already this year.  You may set up this appointment before you leave today or you may call back ([phone]) and schedule an appointment.  Please make sure when you call that you mention that you are scheduling your Annual Wellness Visit with the clinical pharmacist so that the appointment may be made for the proper length of time.     Continue current medications. Continue good therapeutic lifestyle changes which include good diet and exercise. Fall precautions discussed with patient. If an FOBT was given today- please return it to our front desk. If you are over 21 years old - you may need Prevnar 35 or the adult Pneumonia vaccine.  **Flu shots are available--- please call and schedule a FLU-CLINIC appointment**  After your visit with [place] today you will receive a survey in the mail or online from [employer] regarding your care with [place]. Please take a moment to fill this out. Your feedback is very  important to [place] as you can help [place] better understand your patient needs as well as improve your experience and satisfaction. WE CARE ABOUT YOU!!!   The patient will continue to practice good pulmonary hygiene She will stay on her inhalers as she is currently doing She will avoid crowds of people as much as possible She will keep the house as cool as possible She will drink plenty of fluids and try to eat healthy She will follow-up with the oncologist as planned

## 2018-09-18 NOTE — Progress Notes (Signed)
Subjective:    Patient ID: [name], female    DOB: 03/26/1956, 62 y.o.   MRN: [id number]  HPI Pt here for follow up and management of chronic medical problems which includes hyperlipidemia. She is taking medication regularly.  The patient is doing well today with no specific complaints.  She is thankful for feeling as well as she has been feeling.  She is seeing oncology and continues to see Dr. Annamaria Boots because of the small cell lung cancer that was recently diagnosed.  No lab work will be done as this was done by her oncologist, Dr. Earlie Server.  She will be given an FOBT to return.  She also plans are has had a CT scan this month.  Her vital signs are stable.  Her oxygen level is 93% on room air.  Her last chest CT was done on the seventh of this month.  Patient is pleasant and is determined to beat this cancer.  She says her breathing is stable and she is not using any O2 but has it at home if she needs it.  She sees the oncologist and his PA on a regular basis and she feels very confident with their treatment currently.  She is hopeful and that some of the tumor has regressed since she is started the current hemotherapy.  Her husband is right there with her and very supportive.  She denies any chest pain pressure or tightness.  She denies shortness of breath no more than usual and has no trouble with her intestinal track or passing her water.   Patient Active Problem List   Diagnosis Date Noted  . Port-A-Cath in place 09/16/2018  . Postobstructive pneumonia 07/17/2018  . Abnormal LFTs 07/17/2018  . Leukocytosis 07/17/2018  . HCAP (healthcare-associated pneumonia) 07/17/2018  . SCLC (small cell lung carcinoma), left ([place]) 07/15/2018  . Encounter for antineoplastic chemotherapy 07/15/2018  . Encounter for antineoplastic immunotherapy 07/15/2018  . Goals of care, counseling/discussion 07/15/2018  . Malnutrition of moderate degree 07/08/2018  . Mass of lower lobe of left lung 07/07/2018  .  Hyponatremia 07/06/2018  . Hypochloremia 07/06/2018  . Hypokalemia 07/06/2018  . Aortic atherosclerosis ([place]) 05/31/2017  . Essential hypertension 05/31/2017  . Dyslipidemia 05/31/2017  . Pulmonary fibrosis, unspecified ([place]) 09/29/2016  . Exposure to hepatitis C 09/12/2016  . Hyperlipidemia 10/06/2013  . Osteoporosis 06/25/2013  . Rhinitis, nonallergic 05/08/2012  . 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 DYSPNEA 06/24/2008  . Obstructive sleep apnea 06/02/2008  . Alpha-1-antitrypsin deficiency ([place]) 12/06/2007  . COPD mixed type ([place]) 09/14/2007  . Fibromyalgia 09/14/2007   Outpatient Encounter Medications as of 09/18/2018  Medication Sig  . albuterol (PROVENTIL) (2.5 MG/3ML) 0.083% nebulizer solution Take 3 mLs (2.5 mg total) by nebulization every 6 (six) hours as needed.  Marland Kitchen allopurinol (ZYLOPRIM) 300 MG tablet Take 1 tablet (300 mg total) by mouth daily. Begin on 07/25/2018  . amitriptyline (ELAVIL) 75 MG tablet Take 75 mg by mouth at bedtime.    . benzonatate (TESSALON) 100 MG capsule Take 1 capsule (100 mg total) by mouth 3 (three) times daily as needed for cough.  . ezetimibe (ZETIA) 10 MG tablet TAKE 1 TABLET ONCE A DAY  . feeding supplement, ENSURE ENLIVE, (ENSURE ENLIVE) LIQD Take 237 mLs by mouth 2 (two) times daily between meals.  . fluticasone (FLONASE) 50 MCG/ACT nasal spray USE 2 SPRAYS IN EACH NOSTRIL ONCE DAILY.  . folic acid (FOLVITE) 1 MG tablet Take 1 mg by mouth daily.  Marland Kitchen  Glycopyrrolate-Formoterol (BEVESPI AEROSPHERE) 9-4.8 MCG/ACT AERO Inhale 2 puffs into the lungs 2 (two) times daily.  Marland Kitchen HYDROcodone-acetaminophen (NORCO/VICODIN) 5-325 MG tablet Take 1 tablet by mouth 3 (three) times daily.  Marland Kitchen lidocaine-prilocaine (EMLA) cream Apply 1 application topically as needed.  . Nebulizers (COMPRESSOR NEBULIZER) MISC 1 Device by Does not apply route as needed.  Marland Kitchen omeprazole (PRILOSEC) 40 MG capsule TAKE (1) CAPSULE DAILY (Patient taking differently: Take 40 mg by mouth  daily. )  . prochlorperazine (COMPAZINE) 10 MG tablet Take 1 tablet (10 mg total) by mouth every 6 (six) hours as needed for nausea or vomiting.  . rosuvastatin (CRESTOR) 40 MG tablet TAKE 1/2 TABLET ONCE DAILY (Patient taking differently: Take 20 mg by mouth daily. )  . sulfamethoxazole-trimethoprim (BACTRIM DS,SEPTRA DS) 800-160 MG tablet Take 1 tablet by mouth 2 (two) times daily.  . [DISCONTINUED] cephALEXin (KEFLEX) 500 MG capsule Take 1 capsule (500 mg total) by mouth 3 (three) times daily.   No facility-administered encounter medications on file as of 09/18/2018.       Review of Systems  Constitutional: Negative.   HENT: Negative.   Eyes: Negative.   Respiratory: Negative.   Cardiovascular: Negative.   Gastrointestinal: Negative.   Endocrine: Negative.   Genitourinary: Negative.   Musculoskeletal: Negative.   Skin: Negative.   Allergic/Immunologic: Negative.   Neurological: Negative.   Hematological: Negative.   Psychiatric/Behavioral: Negative.        Objective:   Physical Exam  Constitutional: She is oriented to person, place, and time. She appears well-developed and well-nourished. No distress.  The patient is pleasant and calm despite the circumstances she is doing well with the small cell lung cancer.  HENT:  Head: Normocephalic and atraumatic.  Right Ear: External ear normal.  Left Ear: External ear normal.  Nose: Nose normal.  Mouth/Throat: Oropharynx is clear and moist. No oropharyngeal exudate.  Eyes: Pupils are equal, round, and reactive to light. Conjunctivae and EOM are normal. Right eye exhibits no discharge. Left eye exhibits no discharge. No scleral icterus.  Neck: Normal range of motion. Neck supple. No thyromegaly present.  No bruits thyromegaly or anterior cervical adenopathy  Cardiovascular: Normal rate, regular rhythm and normal heart sounds.  No murmur heard. The heart is regular at 84/min  Pulmonary/Chest: Effort normal. No respiratory  distress. She has wheezes. She has rales.  Congested cough with crackles and rales bilaterally as usual for her.  Abdominal: Soft. Bowel sounds are normal. She exhibits no mass. There is no tenderness. There is no guarding.  Slight abdominal fullness left upper quadrant with out masses or tenderness.  Musculoskeletal: Normal range of motion. She exhibits edema. She exhibits no tenderness.  Lymphadenopathy:    She has no cervical adenopathy.  Neurological: She is alert and oriented to person, place, and time. She has normal reflexes. No cranial nerve deficit.  Skin: Skin is warm and dry. No rash noted.  Psychiatric: She has a normal mood and affect. Her behavior is normal. Judgment and thought content normal.  The patient's mood affect and behavior are positive and normal for her.  Nursing note and vitals reviewed.  BP 117/72 (BP Location: Left Arm)   Pulse 91   Temp (!) 97.1 F (36.2 C) (Oral)   Ht 5\' 5"  (1.651 m)   Wt 181 lb (82.1 kg)   SpO2 93%   BMI 30.12 kg/m         Assessment & Plan:  1. Pure hypercholesterolemia -Continue with current treatment and therapeutic  lifestyle changes  2. Vitamin D deficiency -Continue with current treatment  3. Aortic atherosclerosis ([place]) -Continue with healthy eating  4. Fibromyalgia -Continue with current treatment  5. SCLC (small cell lung carcinoma), left ([place]) -Follow-up with oncology and pulmonology as planned  6. Need for immunization against influenza - Flu Vaccine QUAD 36+ mos IM  Patient Instructions                       Medicare Annual Wellness Visit  [facility] and the medical providers at [facility] strive to bring you the best medical care.  In doing so we not only want to address your current medical conditions and concerns but also to detect new conditions early and prevent illness, disease and health-related problems.    Medicare offers a yearly Wellness Visit which allows our clinical  staff to assess your need for preventative services including immunizations, lifestyle education, counseling to decrease risk of preventable diseases and screening for fall risk and other medical concerns.    This visit is provided free of charge (no copay) for all Medicare recipients. The clinical pharmacists at [facility] have begun to conduct these Wellness Visits which will also include a thorough review of all your medications.    As you primary medical provider recommend that you make an appointment for your Annual Wellness Visit if you have not done so already this year.  You may set up this appointment before you leave today or you may call back [phone]) and schedule an appointment.  Please make sure when you call that you mention that you are scheduling your Annual Wellness Visit with the clinical pharmacist so that the appointment may be made for the proper length of time.     Continue current medications. Continue good therapeutic lifestyle changes which include good diet and exercise. Fall precautions discussed with patient. If an FOBT was given today- please return it to our front desk. If you are over 38 years old - you may need Prevnar 18 or the adult Pneumonia vaccine.  **Flu shots are available--- please call and schedule a FLU-CLINIC appointment**  After your visit with [place] today you will receive a survey in the mail or online from [employer] regarding your care with [place]. Please take a moment to fill this out. Your feedback is very important to [place] as you can help [place] better understand your patient needs as well as improve your experience and satisfaction. WE CARE ABOUT YOU!!!   The patient will continue to practice good pulmonary hygiene She will stay on her inhalers as she is currently doing She will avoid crowds of people as much as possible She will keep the house as cool as possible She will drink plenty of fluids and try to eat healthy She will  follow-up with the oncologist as planned   Arrie Senate MD

## 2018-09-23 ENCOUNTER — Inpatient Hospital Stay: Payer: Medicare Other

## 2018-09-23 ENCOUNTER — Encounter: Payer: Self-pay | Admitting: Internal Medicine

## 2018-09-23 ENCOUNTER — Telehealth: Payer: Self-pay | Admitting: Internal Medicine

## 2018-09-23 ENCOUNTER — Inpatient Hospital Stay (HOSPITAL_BASED_OUTPATIENT_CLINIC_OR_DEPARTMENT_OTHER): Payer: Medicare Other | Admitting: Internal Medicine

## 2018-09-23 ENCOUNTER — Inpatient Hospital Stay: Payer: Medicare Other | Attending: Internal Medicine

## 2018-09-23 VITALS — BP 136/72 | HR 97 | Temp 97.9°F | Resp 18 | Ht 65.0 in | Wt 179.0 lb

## 2018-09-23 DIAGNOSIS — Z79899 Other long term (current) drug therapy: Secondary | ICD-10-CM | POA: Insufficient documentation

## 2018-09-23 DIAGNOSIS — C7800 Secondary malignant neoplasm of unspecified lung: Secondary | ICD-10-CM | POA: Insufficient documentation

## 2018-09-23 DIAGNOSIS — C3492 Malignant neoplasm of unspecified part of left bronchus or lung: Secondary | ICD-10-CM

## 2018-09-23 DIAGNOSIS — H65193 Other acute nonsuppurative otitis media, bilateral: Secondary | ICD-10-CM | POA: Insufficient documentation

## 2018-09-23 DIAGNOSIS — C787 Secondary malignant neoplasm of liver and intrahepatic bile duct: Secondary | ICD-10-CM

## 2018-09-23 DIAGNOSIS — Z5112 Encounter for antineoplastic immunotherapy: Secondary | ICD-10-CM

## 2018-09-23 DIAGNOSIS — C7951 Secondary malignant neoplasm of bone: Secondary | ICD-10-CM

## 2018-09-23 DIAGNOSIS — Z5111 Encounter for antineoplastic chemotherapy: Secondary | ICD-10-CM | POA: Insufficient documentation

## 2018-09-23 DIAGNOSIS — I10 Essential (primary) hypertension: Secondary | ICD-10-CM

## 2018-09-23 DIAGNOSIS — J029 Acute pharyngitis, unspecified: Secondary | ICD-10-CM | POA: Diagnosis not present

## 2018-09-23 DIAGNOSIS — Z95828 Presence of other vascular implants and grafts: Secondary | ICD-10-CM

## 2018-09-23 DIAGNOSIS — R5382 Chronic fatigue, unspecified: Secondary | ICD-10-CM

## 2018-09-23 DIAGNOSIS — C3412 Malignant neoplasm of upper lobe, left bronchus or lung: Secondary | ICD-10-CM

## 2018-09-23 LAB — CBC WITH DIFFERENTIAL (CANCER CENTER ONLY)
Abs Immature Granulocytes: 0.03 10*3/uL (ref 0.00–0.07)
BASOS ABS: 0.1 10*3/uL (ref 0.0–0.1)
BASOS PCT: 1 %
EOS ABS: 0.1 10*3/uL (ref 0.0–0.5)
EOS PCT: 2 %
HCT: 34.6 % — ABNORMAL LOW (ref 36.0–46.0)
Hemoglobin: 11.2 g/dL — ABNORMAL LOW (ref 12.0–15.0)
Immature Granulocytes: 0 %
Lymphocytes Relative: 41 %
Lymphs Abs: 3.2 10*3/uL (ref 0.7–4.0)
MCH: 31.7 pg (ref 26.0–34.0)
MCHC: 32.4 g/dL (ref 30.0–36.0)
MCV: 98 fL (ref 80.0–100.0)
Monocytes Absolute: 0.9 10*3/uL (ref 0.1–1.0)
Monocytes Relative: 12 %
NRBC: 0 % (ref 0.0–0.2)
Neutro Abs: 3.4 10*3/uL (ref 1.7–7.7)
Neutrophils Relative %: 44 %
Platelet Count: 198 10*3/uL (ref 150–400)
RBC: 3.53 MIL/uL — ABNORMAL LOW (ref 3.87–5.11)
RDW: 21.3 % — ABNORMAL HIGH (ref 11.5–15.5)
WBC: 7.8 10*3/uL (ref 4.0–10.5)

## 2018-09-23 LAB — CMP (CANCER CENTER ONLY)
ALBUMIN: 3.5 g/dL (ref 3.5–5.0)
ALT: 26 U/L (ref 0–44)
AST: 47 U/L — ABNORMAL HIGH (ref 15–41)
Alkaline Phosphatase: 249 U/L — ABNORMAL HIGH (ref 38–126)
Anion gap: 9 (ref 5–15)
BILIRUBIN TOTAL: 0.3 mg/dL (ref 0.3–1.2)
BUN: 9 mg/dL (ref 6–20)
CHLORIDE: 103 mmol/L (ref 98–111)
CO2: 26 mmol/L (ref 22–32)
CREATININE: 0.7 mg/dL (ref 0.44–1.00)
Calcium: 9.6 mg/dL (ref 8.9–10.3)
GFR, Est AFR Am: >60 mL/min (ref >60)
Glucose, Bld: 107 mg/dL — ABNORMAL HIGH (ref 70–99)
Potassium: 4.3 mmol/L (ref 3.5–5.1)
SODIUM: 138 mmol/L (ref 135–145)
TOTAL PROTEIN: 8.1 g/dL (ref 6.5–8.1)

## 2018-09-23 LAB — TSH: TSH: 2.627 u[IU]/mL (ref 0.308–3.960)

## 2018-09-23 MED ORDER — HEPARIN SOD (PORK) LOCK FLUSH 100 UNIT/ML IV SOLN
500.0000 [IU] | Freq: Once | INTRAVENOUS | Status: AC | PRN
  Administered 2018-09-23: 500 [IU]
  Filled 2018-09-23: qty 5

## 2018-09-23 MED ORDER — PALONOSETRON HCL INJECTION 0.25 MG/5ML
0.2500 mg | Freq: Once | INTRAVENOUS | Status: AC
  Administered 2018-09-23: 0.25 mg via INTRAVENOUS

## 2018-09-23 MED ORDER — SODIUM CHLORIDE 0.9 % IV SOLN
1200.0000 mg | Freq: Once | INTRAVENOUS | Status: AC
  Administered 2018-09-23: 1200 mg via INTRAVENOUS
  Filled 2018-09-23: qty 20

## 2018-09-23 MED ORDER — SODIUM CHLORIDE 0.9 % IV SOLN
100.0000 mg/m2 | Freq: Once | INTRAVENOUS | Status: AC
  Administered 2018-09-23: 190 mg via INTRAVENOUS
  Filled 2018-09-23: qty 9.5

## 2018-09-23 MED ORDER — HYDROCODONE-ACETAMINOPHEN 5-325 MG PO TABS: 1.0000 | ORAL_TABLET | Freq: Three times a day (TID) | ORAL | 0 refills | Status: DC

## 2018-09-23 MED ORDER — SODIUM CHLORIDE 0.9 % IV SOLN
571.5000 mg | Freq: Once | INTRAVENOUS | Status: AC
  Administered 2018-09-23: 570 mg via INTRAVENOUS
  Filled 2018-09-23: qty 57

## 2018-09-23 MED ORDER — PALONOSETRON HCL INJECTION 0.25 MG/5ML
INTRAVENOUS | Status: AC
  Filled 2018-09-23: qty 5

## 2018-09-23 MED ORDER — SODIUM CHLORIDE 0.9 % IV SOLN
Freq: Once | INTRAVENOUS | Status: AC
  Administered 2018-09-23: 10:00:00 via INTRAVENOUS
  Filled 2018-09-23: qty 5

## 2018-09-23 MED ORDER — SODIUM CHLORIDE 0.9% FLUSH
10.0000 mL | INTRAVENOUS | Status: DC | PRN
  Administered 2018-09-23: 10 mL
  Filled 2018-09-23: qty 10

## 2018-09-23 MED ORDER — SODIUM CHLORIDE 0.9 % IV SOLN
Freq: Once | INTRAVENOUS | Status: AC
  Administered 2018-09-23: 10:00:00 via INTRAVENOUS
  Filled 2018-09-23: qty 250

## 2018-09-23 NOTE — Patient Instructions (Signed)
[facility] Discharge Instructions for Patients Receiving Chemotherapy  Today you received the following chemotherapy agents Tecentriq, Carboplatin, and Etoposide.  To help prevent nausea and vomiting after your treatment, we encourage you to take your nausea medication as directed.  If you develop nausea and vomiting that is not controlled by your nausea medication, call the clinic.   BELOW ARE SYMPTOMS THAT SHOULD BE REPORTED IMMEDIATELY:  *FEVER GREATER THAN 100.5 F  *CHILLS WITH OR WITHOUT FEVER  NAUSEA AND VOMITING THAT IS NOT CONTROLLED WITH YOUR NAUSEA MEDICATION  *UNUSUAL SHORTNESS OF BREATH  *UNUSUAL BRUISING OR BLEEDING  TENDERNESS IN MOUTH AND THROAT WITH OR WITHOUT PRESENCE OF ULCERS  *URINARY PROBLEMS  *BOWEL PROBLEMS  UNUSUAL RASH Items with * indicate a potential emergency and should be followed up as soon as possible.  Feel free to call the clinic should you have any questions or concerns. The clinic phone number is [phone].  Please show the [facility] at check-in to the Emergency Department and triage nurse.

## 2018-09-23 NOTE — Progress Notes (Signed)
[place] [phone]   [phone]  OFFICE PROGRESS NOTE  [name], MD [place] [address]  DIAGNOSIS: Extensive stage (T3, N2, M1a)  small cell lung cancer diagnosed in August 2019 and presented with large left hilar/subhilar mass in addition to mediastinal lymphadenopathy and contracture and mass in the right upper lobe.  PRIOR THERAPY:None  CURRENT THERAPY: Therapy with carboplatin for AUC of 5 on day 1, etoposide 100 mg/M2 on days 1, 2 and 3 as well as Tecentriq (Atezolizumab) 1200 mg IV every 3 weeks with Neulasta support.  Status post 3 cycles.  INTERVAL HISTORY: [name] 62 y.o. female returns to the clinic today for follow-up visit accompanied by her husband.  The patient is feeling fine today with no concerning complaints.  She tolerated the last cycle of her treatment well with no concerning adverse effects.  She denied having any chest pain, shortness of breath, cough or hemoptysis.  She denied having any fever or chills.  She has no nausea, vomiting, diarrhea or constipation.  She has no significant weight loss or night sweats.  The patient is here today for evaluation before starting cycle #4.  MEDICAL HISTORY: Past Medical History:  Diagnosis Date  . Alpha-1-antitrypsin deficiency ([place])   . Cervical cancer ([place]) 2004  . 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 This doesn't fit entirely with emphysema.      . Exposure to hepatitis C 09/12/2016  . Fibromyalgia   . GERD (gastroesophageal reflux disease)   . Hyperlipidemia   . IBS (irritable bowel syndrome)   . Obstructive sleep apnea 06/02/2008   NPSG 06/07/08, AHI 6.9/ hr, weight 220 lbs    . Osteoporosis   . Pulmonary fibrosis, unspecified ([place]) 09/29/2016   CXR 03/28/2016  .  Rhinitis, nonallergic 05/08/2012  . 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 Vitamin D deficiency     ALLERGIES:  is allergic to penicillins; actonel [risedronate sodium]; advair diskus [fluticasone-salmeterol]; alendronate sodium; aspirin; azithromycin; keflex [cephalexin]; levofloxacin; morphine; nsaids; omnicef [cefdinir]; erythromycin; and iodine.  MEDICATIONS:  Current Outpatient Medications  Medication Sig Dispense Refill  . albuterol (PROVENTIL) (2.5 MG/3ML) 0.083% nebulizer solution Take 3 mLs (2.5 mg total) by nebulization every 6 (six) hours as needed. 90 mL 4  . allopurinol (ZYLOPRIM) 300 MG tablet Take 1 tablet (300 mg total) by mouth daily. Begin on 07/25/2018 14 tablet 0  . amitriptyline (ELAVIL) 75 MG tablet Take 75 mg by mouth at bedtime.      . benzonatate (TESSALON) 100 MG capsule Take 1 capsule (100 mg total) by mouth 3 (three) times daily as needed for cough. 20 capsule 0  . ezetimibe (ZETIA) 10 MG tablet TAKE 1 TABLET ONCE A DAY 90 tablet 1  . feeding supplement, ENSURE ENLIVE, (ENSURE ENLIVE) LIQD Take 237 mLs by mouth 2 (two) times daily between meals. 60 Bottle 0  . fluticasone (FLONASE) 50 MCG/ACT nasal spray USE 2 SPRAYS IN EACH NOSTRIL ONCE DAILY. 16 g 5  . folic acid (FOLVITE) 1 MG tablet Take 1 mg by mouth daily.    . Glycopyrrolate-Formoterol (BEVESPI AEROSPHERE) 9-4.8 MCG/ACT AERO Inhale 2 puffs into the lungs 2 (two) times daily. 1 Inhaler 12  . HYDROcodone-acetaminophen (NORCO/VICODIN) 5-325 MG tablet Take 1 tablet  by mouth 3 (three) times daily.    Marland Kitchen lidocaine-prilocaine (EMLA) cream Apply 1 application topically as needed. 30 g 0  . Nebulizers (COMPRESSOR NEBULIZER) MISC 1 Device by Does not apply route as needed. 1 each prn  . omeprazole (PRILOSEC) 40 MG capsule TAKE (1) CAPSULE DAILY (Patient taking differently: Take 40 mg by mouth daily. ) 90 capsule 1  . prochlorperazine (COMPAZINE) 10 MG tablet Take 1 tablet (10 mg total)  by mouth every 6 (six) hours as needed for nausea or vomiting. 30 tablet 0  . rosuvastatin (CRESTOR) 40 MG tablet TAKE 1/2 TABLET ONCE DAILY (Patient taking differently: Take 20 mg by mouth daily. ) 45 tablet 1  . sulfamethoxazole-trimethoprim (BACTRIM DS,SEPTRA DS) 800-160 MG tablet Take 1 tablet by mouth 2 (two) times daily. 14 tablet 0   No current facility-administered medications for this visit.     SURGICAL HISTORY:  Past Surgical History:  Procedure Laterality Date  . ABDOMINAL HYSTERECTOMY    . IR IMAGING GUIDED PORT INSERTION  08/02/2018  . LUMBAR DISC SURGERY    . VIDEO BRONCHOSCOPY Bilateral 07/10/2018   Procedure: VIDEO BRONCHOSCOPY WITHOUT FLUORO;  Surgeon: Laurin Coder, MD;  Location: [facility];  Service: Cardiopulmonary;  Laterality: Bilateral;    REVIEW OF SYSTEMS:  A comprehensive review of systems was negative except for: Constitutional: positive for fatigue   PHYSICAL EXAMINATION: General appearance: alert, cooperative, fatigued and no distress Head: Normocephalic, without obvious abnormality, atraumatic Neck: no adenopathy, no JVD, supple, symmetrical, trachea midline and thyroid not enlarged, symmetric, no tenderness/mass/nodules Lymph nodes: Cervical, supraclavicular, and axillary nodes normal. Resp: clear to auscultation bilaterally Back: symmetric, no curvature. ROM normal. No CVA tenderness. Cardio: regular rate and rhythm, S1, S2 normal, no murmur, click, rub or gallop GI: soft, non-tender; bowel sounds normal; no masses,  no organomegaly Extremities: extremities normal, atraumatic, no cyanosis or edema  ECOG PERFORMANCE STATUS: 1 - Symptomatic but completely ambulatory  Blood pressure 136/72, pulse 97, temperature 97.9 F (36.6 C), temperature source Oral, resp. rate 18, height 5\' 5"  (1.651 m), weight 179 lb (81.2 kg), SpO2 94 %.  LABORATORY DATA: Lab Results  Component Value Date   WBC 7.8 09/23/2018   HGB 11.2 (L) 09/23/2018   HCT 34.6 (L)  09/23/2018   MCV 98.0 09/23/2018   PLT 198 09/23/2018      Chemistry      Component Value Date/Time   NA 141 09/16/2018 0740   NA 124 (L) 07/16/2018 0853   K 3.5 09/16/2018 0740   CL 104 09/16/2018 0740   CO2 29 09/16/2018 0740   BUN 6 09/16/2018 0740   BUN 8 07/16/2018 0853   CREATININE 0.62 09/16/2018 0740   CREATININE 0.68 02/13/2013 1523      Component Value Date/Time   CALCIUM 9.4 09/16/2018 0740   ALKPHOS 265 (H) 09/16/2018 0740   AST 44 (H) 09/16/2018 0740   ALT 27 09/16/2018 0740   BILITOT 0.3 09/16/2018 0740       RADIOGRAPHIC STUDIES: Ct Chest W Contrast  Result Date: 08/26/2018 CLINICAL DATA:  Small cell lung cancer diagnosed September 2019. Chemotherapy ongoing. Remote history of cervical cancer. Hysterectomy. EXAM: CT CHEST, ABDOMEN, AND PELVIS WITH CONTRAST TECHNIQUE: Multidetector CT imaging of the chest, abdomen and pelvis was performed following the standard protocol during bolus administration of intravenous contrast. CONTRAST:  120mL OMNIPAQUE IOHEXOL 300 MG/ML  SOLN COMPARISON:  PET-CT 07/25/2018, CT abdomen 07/06/2018, ultrasound 07/19/2018 FINDINGS: CT CHEST FINDINGS Cardiovascular: Coronary artery calcification and aortic  atherosclerotic calcification. Port in the anterior chest wall with tip in distal SVC. Mediastinum/Nodes: No axillary supraclavicular adenopathy. No clear mediastinal hilar adenopathy. No pericardial effusion. Esophagus normal. In comparison to PET-CT 07/25/2018, marked reduction in the bulky AP window and LEFT hilar adenopathy. Lungs/Pleura: Marked improvement in consolidative pattern in the LEFT lower lobe. Marked improvement in the LEFT infrahilar mass obstructing the LEFT lobe bronchus. There is still mild narrowing of the bronchus (image 27/2) but there is now near complete re-expansion of the obstructed LEFT lower lobe. Mild atelectasis remains. Cluster of new nodules in the RIGHT lower lobe (image 36/2). This clustered pattern suggest a  focus of infection or inflammation. Resolution of the patchy nodules in the RIGHT upper lobe seen on comparison PET-CT scan Musculoskeletal: Smudgy round sclerotic lesion at T3, T5 and T10 consistent with skeletal metastasis. These lesions were not hypermetabolic on comparison PET-CT scan and not readily evident on the CT portion exam. CT ABDOMEN AND PELVIS FINDINGS Hepatobiliary: Liver has a fine nodular contour. Multiple low-density lesions throughout the LEFT RIGHT hepatic lobe are too numerous to count. Lesion were hypermetabolic on comparison PET-CT scan. Gallbladder is distended but not inflamed. Common bile duct normal. Pancreas: Pancreas is normal. No ductal dilatation. No pancreatic inflammation. Spleen: Normal spleen Adrenals/urinary tract: Adrenal glands are normal. The inferior pole of the RIGHT kidney there is a 2 cm low-density lesion (image 29/3) which cannot be fully characterize. Second more subtle region of hypoperfusion/enhancement in the mid RIGHT kidney (image 22/7). The ureters and bladder normal. Stomach/Bowel: Stomach, small bowel, appendix, and cecum are normal. The colon and rectosigmoid colon are normal. Vascular/Lymphatic: Abdominal aorta is normal caliber with atherosclerotic calcification. There is no retroperitoneal or periportal lymphadenopathy. No pelvic lymphadenopathy. Reproductive: Post hysterectomy Other: No peritoneal metastasis Musculoskeletal: New sclerotic lesion at L3 and L1 (sagittal image 84/6) IMPRESSION: Chest Impression: 1. Marked improvement in the LEFT infrahilar mass and re-expansion of the LEFT lower lobe. 2. Marked improvement in mediastinal lymphadenopathy. 3. Resolution in the patchy nodules in the RIGHT upper lobe similar comparison exam. New nodularity in the RIGHT lower lobe is favored infectious. Recommend attention on follow-up. 4. 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 Abdomen / Pelvis Impression: 1. Multiple low-density lesions in liver  are suspicious for metastasis. 2. Cirrhotic liver. 3. Low-density lesions in the cortex of the RIGHT kidney with differential including focal pyelonephritis versus malignancy versus. Renal MRI could add specificity as warranted. Recommend clinical correlation for pyelonephritis. 4. 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 These new skeletal metastasis are sclerotic and not evident on comparison PET-CT scan. No hypermetabolic lesions present on comparison PET-CT scan. Presumably visualization of the lesions could relate to chemotherapy versus de Novo lesions. Electronically Signed   By: Suzy Bouchard M.D.   On: 08/26/2018 12:25   Ct Abdomen Pelvis W Contrast  Result Date: 08/26/2018 CLINICAL DATA:  Small cell lung cancer diagnosed September 2019. Chemotherapy ongoing. Remote history of cervical cancer. Hysterectomy. EXAM: CT CHEST, ABDOMEN, AND PELVIS WITH CONTRAST TECHNIQUE: Multidetector CT imaging of the chest, abdomen and pelvis was performed following the standard protocol during bolus administration of intravenous contrast. CONTRAST:  167mL OMNIPAQUE IOHEXOL 300 MG/ML  SOLN COMPARISON:  PET-CT 07/25/2018, CT abdomen 07/06/2018, ultrasound 07/19/2018 FINDINGS: CT CHEST FINDINGS Cardiovascular: Coronary artery calcification and aortic atherosclerotic calcification. Port in the anterior chest wall with tip in distal SVC. Mediastinum/Nodes: No axillary supraclavicular adenopathy. No clear mediastinal hilar adenopathy. No pericardial effusion. Esophagus normal. In comparison to PET-CT  07/25/2018, marked reduction in the bulky AP window and LEFT hilar adenopathy. Lungs/Pleura: Marked improvement in consolidative pattern in the LEFT lower lobe. Marked improvement in the LEFT infrahilar mass obstructing the LEFT lobe bronchus. There is still mild narrowing of the bronchus (image 27/2) but there is now near complete re-expansion of the obstructed LEFT lower lobe. Mild atelectasis remains. Cluster of new  nodules in the RIGHT lower lobe (image 36/2). This clustered pattern suggest a focus of infection or inflammation. Resolution of the patchy nodules in the RIGHT upper lobe seen on comparison PET-CT scan Musculoskeletal: Smudgy round sclerotic lesion at T3, T5 and T10 consistent with skeletal metastasis. These lesions were not hypermetabolic on comparison PET-CT scan and not readily evident on the CT portion exam. CT ABDOMEN AND PELVIS FINDINGS Hepatobiliary: Liver has a fine nodular contour. Multiple low-density lesions throughout the LEFT RIGHT hepatic lobe are too numerous to count. Lesion were hypermetabolic on comparison PET-CT scan. Gallbladder is distended but not inflamed. Common bile duct normal. Pancreas: Pancreas is normal. No ductal dilatation. No pancreatic inflammation. Spleen: Normal spleen Adrenals/urinary tract: Adrenal glands are normal. The inferior pole of the RIGHT kidney there is a 2 cm low-density lesion (image 29/3) which cannot be fully characterize. Second more subtle region of hypoperfusion/enhancement in the mid RIGHT kidney (image 22/7). The ureters and bladder normal. Stomach/Bowel: Stomach, small bowel, appendix, and cecum are normal. The colon and rectosigmoid colon are normal. Vascular/Lymphatic: Abdominal aorta is normal caliber with atherosclerotic calcification. There is no retroperitoneal or periportal lymphadenopathy. No pelvic lymphadenopathy. Reproductive: Post hysterectomy Other: No peritoneal metastasis Musculoskeletal: New sclerotic lesion at L3 and L1 (sagittal image 84/6) IMPRESSION: Chest Impression: 1. Marked improvement in the LEFT infrahilar mass and re-expansion of the LEFT lower lobe. 2. Marked improvement in mediastinal lymphadenopathy. 3. Resolution in the patchy nodules in the RIGHT upper lobe similar comparison exam. New nodularity in the RIGHT lower lobe is favored infectious. Recommend attention on follow-up. 4. New sclerotic skeletal metastasis in the thoracic  spine. Abdomen / Pelvis Impression: 1. Multiple low-density lesions in liver are suspicious for metastasis. 2. Cirrhotic liver. 3. Low-density lesions in the cortex of the RIGHT kidney with differential including focal pyelonephritis versus malignancy versus. Renal MRI could add specificity as warranted. Recommend clinical correlation for pyelonephritis. 4. 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 These new skeletal metastasis are sclerotic and not evident on comparison PET-CT scan. No hypermetabolic lesions present on comparison PET-CT scan. Presumably visualization of the lesions could relate to chemotherapy versus de Novo lesions. Electronically Signed   By: Suzy Bouchard M.D.   On: 08/26/2018 12:25    ASSESSMENT AND PLAN: This is a very pleasant 62 years old white female recently diagnosed with extensive stage small cell lung cancer.  The patient had a recent PET scan that showed multiple metastatic disease in the lung, liver as well as bone. She was supposed to have MRI of the brain performed last week but this was not scheduled for unclear reason.  I will arrange for the patient to have MRI of the brain performed in the next few days. The patient is currently on systemic chemotherapy with carboplatin, etoposide and Tecentriq status post 3 cycles. The patient continues to tolerate her treatment well with no concerning adverse effects except for mild fatigue. I recommended for her to proceed with cycle #4 today as scheduled. I will see her back for follow-up visit in 3 weeks for evaluation before starting cycle #5 with repeat CT scan  of the chest, abdomen and pelvis for restaging of her disease. For pain management I gave the patient refill of her hydrocodone. She was advised to call immediately if she has any concerning symptoms in the interval. The patient voices understanding of current disease status and treatment options and is in agreement with the current care plan.  All questions were  answered. The patient knows to call the clinic with any problems, questions or concerns. We can certainly see the patient much sooner if necessary.  I spent 10 minutes counseling the patient face to face. The total time spent in the appointment was 15 minutes.  Disclaimer: This note was dictated with voice recognition software. Similar sounding words can inadvertently be transcribed and may not be corrected upon review.

## 2018-09-23 NOTE — Telephone Encounter (Signed)
Appts scheduled avs/calendar printed/contrast provided with instructions per 11/4 los

## 2018-09-24 ENCOUNTER — Inpatient Hospital Stay: Payer: Medicare Other

## 2018-09-24 VITALS — BP 127/77 | HR 93 | Temp 97.8°F | Resp 18

## 2018-09-24 DIAGNOSIS — C787 Secondary malignant neoplasm of liver and intrahepatic bile duct: Secondary | ICD-10-CM | POA: Diagnosis not present

## 2018-09-24 DIAGNOSIS — C3492 Malignant neoplasm of unspecified part of left bronchus or lung: Secondary | ICD-10-CM

## 2018-09-24 DIAGNOSIS — Z79899 Other long term (current) drug therapy: Secondary | ICD-10-CM | POA: Diagnosis not present

## 2018-09-24 DIAGNOSIS — J029 Acute pharyngitis, unspecified: Secondary | ICD-10-CM | POA: Diagnosis not present

## 2018-09-24 DIAGNOSIS — Z5111 Encounter for antineoplastic chemotherapy: Secondary | ICD-10-CM | POA: Diagnosis not present

## 2018-09-24 DIAGNOSIS — C7800 Secondary malignant neoplasm of unspecified lung: Secondary | ICD-10-CM | POA: Diagnosis not present

## 2018-09-24 DIAGNOSIS — H65193 Other acute nonsuppurative otitis media, bilateral: Secondary | ICD-10-CM | POA: Diagnosis not present

## 2018-09-24 DIAGNOSIS — C7951 Secondary malignant neoplasm of bone: Secondary | ICD-10-CM | POA: Diagnosis not present

## 2018-09-24 DIAGNOSIS — C3412 Malignant neoplasm of upper lobe, left bronchus or lung: Secondary | ICD-10-CM | POA: Diagnosis not present

## 2018-09-24 MED ORDER — SODIUM CHLORIDE 0.9% FLUSH
10.0000 mL | INTRAVENOUS | Status: DC | PRN
  Administered 2018-09-24: 10 mL
  Filled 2018-09-24: qty 10

## 2018-09-24 MED ORDER — HEPARIN SOD (PORK) LOCK FLUSH 100 UNIT/ML IV SOLN
500.0000 [IU] | Freq: Once | INTRAVENOUS | Status: AC | PRN
  Administered 2018-09-24: 500 [IU]
  Filled 2018-09-24: qty 5

## 2018-09-24 MED ORDER — DEXAMETHASONE SODIUM PHOSPHATE 10 MG/ML IJ SOLN
INTRAMUSCULAR | Status: AC
  Filled 2018-09-24: qty 1

## 2018-09-24 MED ORDER — SODIUM CHLORIDE 0.9 % IV SOLN
100.0000 mg/m2 | Freq: Once | INTRAVENOUS | Status: AC
  Administered 2018-09-24: 190 mg via INTRAVENOUS
  Filled 2018-09-24: qty 9.5

## 2018-09-24 MED ORDER — SODIUM CHLORIDE 0.9 % IV SOLN
Freq: Once | INTRAVENOUS | Status: AC
  Administered 2018-09-24: 09:00:00 via INTRAVENOUS
  Filled 2018-09-24: qty 250

## 2018-09-24 MED ORDER — DEXAMETHASONE SODIUM PHOSPHATE 10 MG/ML IJ SOLN
10.0000 mg | Freq: Once | INTRAMUSCULAR | Status: AC
  Administered 2018-09-24: 10 mg via INTRAVENOUS

## 2018-09-25 ENCOUNTER — Inpatient Hospital Stay: Payer: Medicare Other

## 2018-09-25 VITALS — BP 126/77 | HR 88 | Temp 98.0°F | Resp 18

## 2018-09-25 DIAGNOSIS — H65193 Other acute nonsuppurative otitis media, bilateral: Secondary | ICD-10-CM | POA: Diagnosis not present

## 2018-09-25 DIAGNOSIS — J029 Acute pharyngitis, unspecified: Secondary | ICD-10-CM | POA: Diagnosis not present

## 2018-09-25 DIAGNOSIS — Z79899 Other long term (current) drug therapy: Secondary | ICD-10-CM | POA: Diagnosis not present

## 2018-09-25 DIAGNOSIS — C7800 Secondary malignant neoplasm of unspecified lung: Secondary | ICD-10-CM | POA: Diagnosis not present

## 2018-09-25 DIAGNOSIS — C3412 Malignant neoplasm of upper lobe, left bronchus or lung: Secondary | ICD-10-CM | POA: Diagnosis not present

## 2018-09-25 DIAGNOSIS — C3492 Malignant neoplasm of unspecified part of left bronchus or lung: Secondary | ICD-10-CM

## 2018-09-25 DIAGNOSIS — Z5111 Encounter for antineoplastic chemotherapy: Secondary | ICD-10-CM | POA: Diagnosis not present

## 2018-09-25 DIAGNOSIS — C787 Secondary malignant neoplasm of liver and intrahepatic bile duct: Secondary | ICD-10-CM | POA: Diagnosis not present

## 2018-09-25 DIAGNOSIS — C7951 Secondary malignant neoplasm of bone: Secondary | ICD-10-CM | POA: Diagnosis not present

## 2018-09-25 MED ORDER — SODIUM CHLORIDE 0.9 % IV SOLN
100.0000 mg/m2 | Freq: Once | INTRAVENOUS | Status: AC
  Administered 2018-09-25: 190 mg via INTRAVENOUS
  Filled 2018-09-25: qty 9.5

## 2018-09-25 MED ORDER — SODIUM CHLORIDE 0.9% FLUSH
10.0000 mL | INTRAVENOUS | Status: DC | PRN
  Administered 2018-09-25: 10 mL
  Filled 2018-09-25: qty 10

## 2018-09-25 MED ORDER — HEPARIN SOD (PORK) LOCK FLUSH 100 UNIT/ML IV SOLN
500.0000 [IU] | Freq: Once | INTRAVENOUS | Status: AC | PRN
  Administered 2018-09-25: 500 [IU]
  Filled 2018-09-25: qty 5

## 2018-09-25 MED ORDER — DEXAMETHASONE SODIUM PHOSPHATE 10 MG/ML IJ SOLN
INTRAMUSCULAR | Status: AC
  Filled 2018-09-25: qty 1

## 2018-09-25 MED ORDER — DEXAMETHASONE SODIUM PHOSPHATE 10 MG/ML IJ SOLN
10.0000 mg | Freq: Once | INTRAMUSCULAR | Status: AC
  Administered 2018-09-25: 10 mg via INTRAVENOUS

## 2018-09-25 MED ORDER — SODIUM CHLORIDE 0.9 % IV SOLN
Freq: Once | INTRAVENOUS | Status: AC
  Administered 2018-09-25: 09:00:00 via INTRAVENOUS
  Filled 2018-09-25: qty 250

## 2018-09-25 NOTE — Patient Instructions (Signed)
[facility] Discharge Instructions for Patients Receiving Chemotherapy  Today you received the following chemotherapy agents: etoposide  To help prevent nausea and vomiting after your treatment, we encourage you to take your nausea medication as directed.   If you develop nausea and vomiting that is not controlled by your nausea medication, call the clinic.   BELOW ARE SYMPTOMS THAT SHOULD BE REPORTED IMMEDIATELY:  *FEVER GREATER THAN 100.5 F  *CHILLS WITH OR WITHOUT FEVER  NAUSEA AND VOMITING THAT IS NOT CONTROLLED WITH YOUR NAUSEA MEDICATION  *UNUSUAL SHORTNESS OF BREATH  *UNUSUAL BRUISING OR BLEEDING  TENDERNESS IN MOUTH AND THROAT WITH OR WITHOUT PRESENCE OF ULCERS  *URINARY PROBLEMS  *BOWEL PROBLEMS  UNUSUAL RASH Items with * indicate a potential emergency and should be followed up as soon as possible.  Feel free to call the clinic should you have any questions or concerns. The clinic phone number is [phone].  Please show the CHEMO ALERT CARD at check-in to the Emergency Department and triage nurse.   

## 2018-09-26 DIAGNOSIS — M797 Fibromyalgia: Secondary | ICD-10-CM | POA: Diagnosis not present

## 2018-09-26 DIAGNOSIS — M150 Primary generalized (osteo)arthritis: Secondary | ICD-10-CM | POA: Diagnosis not present

## 2018-09-26 DIAGNOSIS — M5136 Other intervertebral disc degeneration, lumbar region: Secondary | ICD-10-CM | POA: Diagnosis not present

## 2018-09-26 DIAGNOSIS — M810 Age-related osteoporosis without current pathological fracture: Secondary | ICD-10-CM | POA: Diagnosis not present

## 2018-09-26 DIAGNOSIS — M25512 Pain in left shoulder: Secondary | ICD-10-CM | POA: Diagnosis not present

## 2018-09-27 ENCOUNTER — Inpatient Hospital Stay: Payer: Medicare Other

## 2018-09-27 VITALS — BP 123/72 | HR 111 | Temp 97.9°F | Resp 18

## 2018-09-27 DIAGNOSIS — J029 Acute pharyngitis, unspecified: Secondary | ICD-10-CM | POA: Diagnosis not present

## 2018-09-27 DIAGNOSIS — Z5111 Encounter for antineoplastic chemotherapy: Secondary | ICD-10-CM | POA: Diagnosis not present

## 2018-09-27 DIAGNOSIS — C787 Secondary malignant neoplasm of liver and intrahepatic bile duct: Secondary | ICD-10-CM | POA: Diagnosis not present

## 2018-09-27 DIAGNOSIS — C3492 Malignant neoplasm of unspecified part of left bronchus or lung: Secondary | ICD-10-CM

## 2018-09-27 DIAGNOSIS — H65193 Other acute nonsuppurative otitis media, bilateral: Secondary | ICD-10-CM | POA: Diagnosis not present

## 2018-09-27 DIAGNOSIS — C3412 Malignant neoplasm of upper lobe, left bronchus or lung: Secondary | ICD-10-CM | POA: Diagnosis not present

## 2018-09-27 DIAGNOSIS — C7951 Secondary malignant neoplasm of bone: Secondary | ICD-10-CM | POA: Diagnosis not present

## 2018-09-27 DIAGNOSIS — C7800 Secondary malignant neoplasm of unspecified lung: Secondary | ICD-10-CM | POA: Diagnosis not present

## 2018-09-27 DIAGNOSIS — Z79899 Other long term (current) drug therapy: Secondary | ICD-10-CM | POA: Diagnosis not present

## 2018-09-27 MED ORDER — PEGFILGRASTIM-CBQV 6 MG/0.6ML ~~LOC~~ SOSY
6.0000 mg | PREFILLED_SYRINGE | Freq: Once | SUBCUTANEOUS | Status: AC
  Administered 2018-09-27: 6 mg via SUBCUTANEOUS

## 2018-09-27 MED ORDER — PEGFILGRASTIM-CBQV 6 MG/0.6ML ~~LOC~~ SOSY
PREFILLED_SYRINGE | SUBCUTANEOUS | Status: AC
  Filled 2018-09-27: qty 0.6

## 2018-09-30 ENCOUNTER — Inpatient Hospital Stay: Payer: Medicare Other

## 2018-09-30 DIAGNOSIS — C787 Secondary malignant neoplasm of liver and intrahepatic bile duct: Secondary | ICD-10-CM | POA: Diagnosis not present

## 2018-09-30 DIAGNOSIS — Z5111 Encounter for antineoplastic chemotherapy: Secondary | ICD-10-CM | POA: Diagnosis not present

## 2018-09-30 DIAGNOSIS — C7800 Secondary malignant neoplasm of unspecified lung: Secondary | ICD-10-CM | POA: Diagnosis not present

## 2018-09-30 DIAGNOSIS — Z79899 Other long term (current) drug therapy: Secondary | ICD-10-CM | POA: Diagnosis not present

## 2018-09-30 DIAGNOSIS — Z95828 Presence of other vascular implants and grafts: Secondary | ICD-10-CM

## 2018-09-30 DIAGNOSIS — C7951 Secondary malignant neoplasm of bone: Secondary | ICD-10-CM | POA: Diagnosis not present

## 2018-09-30 DIAGNOSIS — J029 Acute pharyngitis, unspecified: Secondary | ICD-10-CM | POA: Diagnosis not present

## 2018-09-30 DIAGNOSIS — H65193 Other acute nonsuppurative otitis media, bilateral: Secondary | ICD-10-CM | POA: Diagnosis not present

## 2018-09-30 DIAGNOSIS — C3412 Malignant neoplasm of upper lobe, left bronchus or lung: Secondary | ICD-10-CM | POA: Diagnosis not present

## 2018-09-30 DIAGNOSIS — C3492 Malignant neoplasm of unspecified part of left bronchus or lung: Secondary | ICD-10-CM

## 2018-09-30 LAB — CMP (CANCER CENTER ONLY)
ALBUMIN: 3.3 g/dL — AB (ref 3.5–5.0)
ALT: 25 U/L (ref 0–44)
ANION GAP: 9 (ref 5–15)
AST: 41 U/L (ref 15–41)
Alkaline Phosphatase: 254 U/L — ABNORMAL HIGH (ref 38–126)
BILIRUBIN TOTAL: 0.4 mg/dL (ref 0.3–1.2)
BUN: 7 mg/dL (ref 6–20)
CHLORIDE: 104 mmol/L (ref 98–111)
CO2: 28 mmol/L (ref 22–32)
Calcium: 9 mg/dL (ref 8.9–10.3)
Creatinine: 0.58 mg/dL (ref 0.44–1.00)
GFR, Est AFR Am: >60 mL/min (ref >60)
Glucose, Bld: 105 mg/dL — ABNORMAL HIGH (ref 70–99)
Potassium: 3.7 mmol/L (ref 3.5–5.1)
SODIUM: 141 mmol/L (ref 135–145)
TOTAL PROTEIN: 7 g/dL (ref 6.5–8.1)

## 2018-09-30 LAB — CBC WITH DIFFERENTIAL (CANCER CENTER ONLY)
BAND NEUTROPHILS: 4 %
BASOS PCT: 1 %
Basophils Absolute: 0.2 10*3/uL — ABNORMAL HIGH (ref 0.0–0.1)
EOS ABS: 0.2 10*3/uL (ref 0.0–0.5)
Eosinophils Relative: 1 %
HCT: 29.7 % — ABNORMAL LOW (ref 36.0–46.0)
HEMOGLOBIN: 9.5 g/dL — AB (ref 12.0–15.0)
Lymphocytes Relative: 9 %
Lymphs Abs: 1.7 10*3/uL (ref 0.7–4.0)
MCH: 32.9 pg (ref 26.0–34.0)
MCHC: 32 g/dL (ref 30.0–36.0)
MCV: 102.8 fL — ABNORMAL HIGH (ref 80.0–100.0)
METAMYELOCYTES PCT: 1 %
MONOS PCT: 2 %
Monocytes Absolute: 0.4 10*3/uL (ref 0.1–1.0)
NEUTROS ABS: 16.1 10*3/uL (ref 1.7–17.7)
Neutrophils Relative %: 82 %
PLATELETS: 92 10*3/uL — AB (ref 150–400)
RBC: 2.89 MIL/uL — AB (ref 3.87–5.11)
RDW: 18.8 % — ABNORMAL HIGH (ref 11.5–15.5)
WBC: 18.5 10*3/uL — AB (ref 4.0–10.5)
nRBC: 0 % (ref 0.0–0.2)

## 2018-09-30 MED ORDER — SODIUM CHLORIDE 0.9% FLUSH
10.0000 mL | INTRAVENOUS | Status: DC | PRN
  Administered 2018-09-30: 10 mL
  Filled 2018-09-30: qty 10

## 2018-09-30 MED ORDER — HEPARIN SOD (PORK) LOCK FLUSH 100 UNIT/ML IV SOLN
500.0000 [IU] | Freq: Once | INTRAVENOUS | Status: AC | PRN
  Administered 2018-09-30: 500 [IU]
  Filled 2018-09-30: qty 5

## 2018-10-02 ENCOUNTER — Telehealth: Payer: Self-pay | Admitting: Medical Oncology

## 2018-10-02 NOTE — Telephone Encounter (Signed)
Low grade fever 2 days ago , afebrile today . Cough /cold,  like symptoms nasal stuffiness-Reports yellowish sputum. [facility] appt made.

## 2018-10-03 ENCOUNTER — Inpatient Hospital Stay (HOSPITAL_BASED_OUTPATIENT_CLINIC_OR_DEPARTMENT_OTHER): Payer: Medicare Other | Admitting: Medical

## 2018-10-03 VITALS — BP 131/60 | HR 98 | Temp 97.7°F | Resp 18 | Ht 65.0 in | Wt 178.3 lb

## 2018-10-03 DIAGNOSIS — H65193 Other acute nonsuppurative otitis media, bilateral: Secondary | ICD-10-CM | POA: Diagnosis not present

## 2018-10-03 DIAGNOSIS — C7951 Secondary malignant neoplasm of bone: Secondary | ICD-10-CM | POA: Diagnosis not present

## 2018-10-03 DIAGNOSIS — Z79899 Other long term (current) drug therapy: Secondary | ICD-10-CM

## 2018-10-03 DIAGNOSIS — Z5111 Encounter for antineoplastic chemotherapy: Secondary | ICD-10-CM | POA: Diagnosis not present

## 2018-10-03 DIAGNOSIS — J069 Acute upper respiratory infection, unspecified: Secondary | ICD-10-CM | POA: Diagnosis not present

## 2018-10-03 DIAGNOSIS — C3412 Malignant neoplasm of upper lobe, left bronchus or lung: Secondary | ICD-10-CM

## 2018-10-03 DIAGNOSIS — C3492 Malignant neoplasm of unspecified part of left bronchus or lung: Secondary | ICD-10-CM

## 2018-10-03 DIAGNOSIS — J029 Acute pharyngitis, unspecified: Secondary | ICD-10-CM | POA: Diagnosis not present

## 2018-10-03 DIAGNOSIS — C787 Secondary malignant neoplasm of liver and intrahepatic bile duct: Secondary | ICD-10-CM | POA: Diagnosis not present

## 2018-10-03 DIAGNOSIS — C7800 Secondary malignant neoplasm of unspecified lung: Secondary | ICD-10-CM

## 2018-10-03 MED ORDER — SULFAMETHOXAZOLE-TRIMETHOPRIM 800-160 MG PO TABS: 1.0000 | ORAL_TABLET | Freq: Two times a day (BID) | ORAL | 0 refills | Status: DC

## 2018-10-03 NOTE — Patient Instructions (Signed)
Upper Respiratory Infection, Adult Most upper respiratory infections (URIs) are caused by a virus. A URI affects the nose, throat, and upper air passages. The most common type of URI is often called "the common cold." Follow these instructions at home:  Take medicines only as told by your doctor.  Gargle warm saltwater or take cough drops to comfort your throat as told by your doctor.  Use a warm mist humidifier or inhale steam from a shower to increase air moisture. This may make it easier to breathe.  Drink enough fluid to keep your pee (urine) clear or pale yellow.  Eat soups and other clear broths.  Have a healthy diet.  Rest as needed.  Go back to work when your fever is gone or your doctor says it is okay. ? You may need to stay home longer to avoid giving your URI to others. ? You can also wear a face mask and wash your hands often to prevent spread of the virus.  Use your inhaler more if you have asthma.  Do not use any tobacco products, including cigarettes, chewing tobacco, or electronic cigarettes. If you need help quitting, ask your doctor. Contact a doctor if:  You are getting worse, not better.  Your symptoms are not helped by medicine.  You have chills.  You are getting more short of breath.  You have brown or red mucus.  You have yellow or brown discharge from your nose.  You have pain in your face, especially when you bend forward.  You have a fever.  You have puffy (swollen) neck glands.  You have pain while swallowing.  You have white areas in the back of your throat. Get help right away if:  You have very bad or constant: ? Headache. ? Ear pain. ? Pain in your forehead, behind your eyes, and over your cheekbones (sinus pain). ? Chest pain.  You have long-lasting (chronic) lung disease and any of the following: ? Wheezing. ? Long-lasting cough. ? Coughing up blood. ? A change in your usual mucus.  You have a stiff neck.  You have  changes in your: ? Vision. ? Hearing. ? Thinking. ? Mood. This information is not intended to replace advice given to you by your health care provider. Make sure you discuss any questions you have with your health care provider. Document Released: 04/24/2008 Document Revised: 07/09/2016 Document Reviewed: 02/11/2014 Elsevier Interactive Patient Education  2018 [employer]  

## 2018-10-03 NOTE — Progress Notes (Signed)
Pt presents with stuffy nose, early fullness, drainage, coughing with sputum, chest congestion, sore throat, and gen fatigue.  Afebrile.  Denies N/V/D.

## 2018-10-07 ENCOUNTER — Inpatient Hospital Stay: Payer: Medicare Other

## 2018-10-07 DIAGNOSIS — C7951 Secondary malignant neoplasm of bone: Secondary | ICD-10-CM | POA: Diagnosis not present

## 2018-10-07 DIAGNOSIS — C7800 Secondary malignant neoplasm of unspecified lung: Secondary | ICD-10-CM | POA: Diagnosis not present

## 2018-10-07 DIAGNOSIS — Z79899 Other long term (current) drug therapy: Secondary | ICD-10-CM | POA: Diagnosis not present

## 2018-10-07 DIAGNOSIS — Z5111 Encounter for antineoplastic chemotherapy: Secondary | ICD-10-CM | POA: Diagnosis not present

## 2018-10-07 DIAGNOSIS — C3412 Malignant neoplasm of upper lobe, left bronchus or lung: Secondary | ICD-10-CM | POA: Diagnosis not present

## 2018-10-07 DIAGNOSIS — Z95828 Presence of other vascular implants and grafts: Secondary | ICD-10-CM

## 2018-10-07 DIAGNOSIS — J029 Acute pharyngitis, unspecified: Secondary | ICD-10-CM | POA: Diagnosis not present

## 2018-10-07 DIAGNOSIS — H65193 Other acute nonsuppurative otitis media, bilateral: Secondary | ICD-10-CM | POA: Diagnosis not present

## 2018-10-07 DIAGNOSIS — C3492 Malignant neoplasm of unspecified part of left bronchus or lung: Secondary | ICD-10-CM

## 2018-10-07 DIAGNOSIS — C787 Secondary malignant neoplasm of liver and intrahepatic bile duct: Secondary | ICD-10-CM | POA: Diagnosis not present

## 2018-10-07 LAB — CMP (CANCER CENTER ONLY)
ALBUMIN: 3.4 g/dL — AB (ref 3.5–5.0)
ALK PHOS: 254 U/L — AB (ref 38–126)
ALT: 22 U/L (ref 0–44)
AST: 33 U/L (ref 15–41)
Anion gap: 8 (ref 5–15)
BUN: 9 mg/dL (ref 6–20)
CO2: 28 mmol/L (ref 22–32)
CREATININE: 0.69 mg/dL (ref 0.44–1.00)
Calcium: 9.4 mg/dL (ref 8.9–10.3)
Chloride: 105 mmol/L (ref 98–111)
GFR, Est AFR Am: >60 mL/min (ref >60)
GFR, Estimated: >60 mL/min (ref >60)
GLUCOSE: 100 mg/dL — AB (ref 70–99)
Potassium: 4.2 mmol/L (ref 3.5–5.1)
SODIUM: 141 mmol/L (ref 135–145)
Total Bilirubin: 0.3 mg/dL (ref 0.3–1.2)
Total Protein: 7.3 g/dL (ref 6.5–8.1)

## 2018-10-07 LAB — CBC WITH DIFFERENTIAL (CANCER CENTER ONLY)
Abs Immature Granulocytes: 0 10*3/uL (ref 0.00–0.07)
Basophils Absolute: 0 10*3/uL (ref 0.0–0.1)
Basophils Relative: 0 %
EOS ABS: 0.1 10*3/uL (ref 0.0–0.5)
Eosinophils Relative: 3 %
HCT: 32.2 % — ABNORMAL LOW (ref 36.0–46.0)
HEMOGLOBIN: 10.2 g/dL — AB (ref 12.0–15.0)
IMMATURE GRANULOCYTES: 0 %
LYMPHS ABS: 2.2 10*3/uL (ref 0.7–4.0)
LYMPHS PCT: 49 %
MCH: 32.5 pg (ref 26.0–34.0)
MCHC: 31.7 g/dL (ref 30.0–36.0)
MCV: 102.5 fL — ABNORMAL HIGH (ref 80.0–100.0)
MONOS PCT: 11 %
Monocytes Absolute: 0.5 10*3/uL (ref 0.1–1.0)
Neutro Abs: 1.6 10*3/uL — ABNORMAL LOW (ref 1.7–7.7)
Neutrophils Relative %: 37 %
Platelet Count: 56 10*3/uL — ABNORMAL LOW (ref 150–400)
RBC: 3.14 MIL/uL — ABNORMAL LOW (ref 3.87–5.11)
RDW: 18.7 % — ABNORMAL HIGH (ref 11.5–15.5)
WBC Count: 4.5 10*3/uL (ref 4.0–10.5)
nRBC: 0 % (ref 0.0–0.2)

## 2018-10-07 MED ORDER — HEPARIN SOD (PORK) LOCK FLUSH 100 UNIT/ML IV SOLN
500.0000 [IU] | Freq: Once | INTRAVENOUS | Status: AC | PRN
  Administered 2018-10-07: 500 [IU]
  Filled 2018-10-07: qty 5

## 2018-10-07 MED ORDER — SODIUM CHLORIDE 0.9% FLUSH
10.0000 mL | INTRAVENOUS | Status: DC | PRN
  Administered 2018-10-07: 10 mL
  Filled 2018-10-07: qty 10

## 2018-10-07 NOTE — Progress Notes (Signed)
[facility] Progress Note   [name] [id number] [birth year] 62 y.o.   [name] is managed by Dr. Fanny Bien. Mohamed    Actively treated with chemotherapy/immunotherapy: yes  Current Therapy: Carboplatin, etoposide, and Tecentriq with PEG filgrastim support  Last Treated: 09/27/2018 (cycle 4)  Assessment: Plan:    Upper respiratory tract infection, unspecified type - Plan: sulfamethoxazole-trimethoprim (BACTRIM DS,SEPTRA DS) 800-160 MG tablet  SCLC (small cell lung carcinoma), left (HCC)  Other non-recurrent acute nonsuppurative otitis media of both ears - Plan: sulfamethoxazole-trimethoprim (BACTRIM DS,SEPTRA DS) 800-160 MG tablet   URI: The patient was given a prescription for Bactrim DS p.o. twice daily x7 days.  Otitis media: The patient was given a prescription for Bactrim DS p.o. twice daily x7 days.  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  She will follow-up with Dr. Julien Nordmann on 10/14/2018.  Please see After Visit Summary for patient specific instructions.  Future Appointments  Date Time Provider [place]  10/08/2018 10:30 AM Baird Lyons D, MD [facility] None  10/11/2018  3:30 PM [facility] [place]  10/14/2018  8:00 AM [facility] 2 [facility] None  10/14/2018  8:15 AM [facility] [place] FLUSH [facility] None  10/14/2018  9:00 AM Curt Bears, MD [facility] None  10/14/2018 10:00 AM [facility] INFUSION [facility] None  10/15/2018  8:00 AM [facility] INFUSION [facility] None  10/16/2018  8:00 AM [facility] INFUSION [facility] None  10/21/2018  8:00 AM [facility] LAB 5 [facility] None  10/21/2018  8:15 AM [facility] [place] FLUSH [facility] None  10/28/2018  8:00 AM [facility] LAB 5 [facility] None  10/28/2018  8:15 AM [facility] [place] FLUSH [facility] None  11/04/2018  7:45 AM  [facility] LAB 5 [facility] None  11/04/2018  8:00 AM [facility] [place] FLUSH [facility] None  11/04/2018  8:30 AM Curt Bears, MD [facility] None  11/04/2018  9:30 AM [facility] INFUSION [facility] None  11/05/2018  8:00 AM [facility] INFUSION [facility] None  11/06/2018  8:00 AM [facility] INFUSION [facility] None  11/08/2018 10:00 AM [facility] [place] FLUSH [facility] None  11/11/2018 10:30 AM [facility] ONLY [facility] None  11/11/2018 10:45 AM [facility] [place] FLUSH [facility] None  11/18/2018 10:30 AM [facility] ONLY [facility] None  11/18/2018 10:45 AM [facility] [place] FLUSH [facility] None  11/25/2018  8:45 AM [facility] LAB 5 [facility] None  11/25/2018  9:00 AM [facility] [place] FLUSH [facility] None  11/25/2018  9:30 AM Curcio, Kristin R, NP [facility] None  11/25/2018 10:15 AM [facility] INFUSION [facility] None  11/26/2018  9:30 AM [facility] INFUSION [facility] None  11/27/2018  9:30 AM [facility] INFUSION [facility] None  11/29/2018  9:00 AM [facility] [place] FLUSH [facility] None  01/27/2019  9:30 AM [name], MD [facility] None    No orders of the defined types were placed in this encounter.      Subjective:   Patient ID:  [name] is a 62 y.o. (DOB May 27, 1956) female.  Chief Complaint:  Chief Complaint  Patient presents with  . URI    HPI [name] is a 62 year old female with a history of an extensive stage small cell lung cancer who is managed by Dr. Fanny Bien. Mohamed.  She is status post cycle 4 of carboplatin, etoposide, and Tecentriq with PEG filgrastim support.  She presents to the office today with her husband.  She has had a 5-day history of a  cough with dark yellow sputum.  She reports postnasal drainage, chills, fatigue, sore throat, fullness in her head, and congestion in her nose and chest.  She denies fevers, chills, nausea, vomiting, or diarrhea.  Medications: I have reviewed the patient's current medications.  Allergies:  Allergies    Allergen Reactions  . Penicillins Nausea And Vomiting    Has patient had a PCN reaction causing immediate rash, facial/tongue/throat swelling, SOB or lightheadedness with hypotension: Unknown Has patient had a PCN reaction causing severe rash involving mucus membranes or skin necrosis: Unknown Has patient had a PCN reaction that required hospitalization: Unknown Has patient had a PCN reaction occurring within the last 10 years: Unknown If all of the above answers are "NO", then may proceed with Cephalosporin use.   Marland Kitchen Actonel [Risedronate Sodium]     Caused her to retain fluid and stomach problems  . Advair Diskus [Fluticasone-Salmeterol]   . Alendronate Sodium     Stomach upset / esophageal burning  . Aspirin Nausea Only    Stomach burns; abdominal pain  . Azithromycin Nausea And Vomiting  . Keflex [Cephalexin] Itching  . Levofloxacin     Reports hx of GI distress and GI bleed  . Morphine Itching and Swelling    Throat swells  . Nsaids     Swelling, GI upset   . Omnicef [Cefdinir]   . Erythromycin Rash  . Iodine Rash    Topical and IV    Past Medical History:  Diagnosis Date  . Alpha-1-antitrypsin deficiency ([place])   . Cervical cancer ([place]) 2004  . 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 This doesn't fit entirely with emphysema.      . Exposure to hepatitis C 09/12/2016  . Fibromyalgia   . GERD (gastroesophageal reflux disease)   . Hyperlipidemia   . IBS (irritable bowel syndrome)   . Obstructive sleep apnea 06/02/2008   NPSG 06/07/08, AHI 6.9/ hr, weight 220 lbs    . Osteoporosis   . Pulmonary fibrosis, unspecified ([place]) 09/29/2016   CXR 03/28/2016  . Rhinitis, nonallergic 05/08/2012  . 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 Vitamin D deficiency     Past Surgical  History:  Procedure Laterality Date  . ABDOMINAL HYSTERECTOMY    . IR IMAGING GUIDED PORT INSERTION  08/02/2018  . LUMBAR DISC SURGERY    . VIDEO BRONCHOSCOPY Bilateral 07/10/2018   Procedure: VIDEO BRONCHOSCOPY WITHOUT FLUORO;  Surgeon: Laurin Coder, MD;  Location: [facility];  Service: Cardiopulmonary;  Laterality: Bilateral;    Family History  Problem Relation Age of Onset  . Alpha-1 antitrypsin deficiency Sister   . Osteoporosis Sister   . Pancreatic cancer Mother 78  . Osteoporosis Mother   . Irregular heart beat Mother   . Heart attack Father 37       Died age 79  . Migraines Father   . CAD Sister 60       Died age 45  . Osteoporosis Sister   . Lung cancer Brother   . CAD Brother 71       CABG.  Died from leukemia  . Leukemia Brother   . Heart attack Brother   . Alpha-1 antitrypsin deficiency Other   . Heart disease Brother   . Colon cancer Neg Hx     Social History  Socioeconomic History  . Marital status: Married    Spouse name: Audry Pili  . Number of children: 2  . Years of education: Not on file  . Highest education level: Not on file  Occupational History  . Occupation: disabled    Fish farm manager: UNEMPLOYED  Social Needs  . Financial resource strain: Not hard at all  . Food insecurity:    Worry: Never true    Inability: Never true  . Transportation needs:    Medical: No    Non-medical: No  Tobacco Use  . Smoking status: Former Smoker    Packs/day: 0.50    Types: Cigarettes    Start date: 11/20/1972    Last attempt to quit: 04/01/2012    Years since quitting: 6.5  . Smokeless tobacco: Never Used  Substance and Sexual Activity  . Alcohol use: No  . Drug use: No  . Sexual activity: Yes  Lifestyle  . Physical activity:    Days per week: 5 days    Minutes per session: 30 min  . Stress: Not at all  Relationships  . Social connections:    Talks on phone: More than three times a week    Gets together: More than three times a week    Attends  religious service: More than 4 times per year    Active member of club or organization: No    Attends meetings of clubs or organizations: Never    Relationship status: Not on file  . Intimate partner violence:    Fear of current or ex partner: No    Emotionally abused: No    Physically abused: No    Forced sexual activity: No  Other Topics Concern  . Not on file  Social History Narrative   Lives at home with husband.     Past Medical History, Surgical history, Social history, and Family history were reviewed and updated as appropriate.   Please see review of systems for further details on the patient's review from today.   Review of Systems:  Review of Systems  Constitutional: Positive for chills and fatigue. Negative for diaphoresis and fever.  HENT: Positive for congestion, sinus pressure, sinus pain and sore throat. Negative for postnasal drip, rhinorrhea and trouble swallowing.   Respiratory: Positive for cough. Negative for shortness of breath and wheezing.   Cardiovascular: Negative for chest pain and palpitations.  Neurological: Negative for headaches.    Objective:   Physical Exam:  BP 131/60 (BP Location: Left Arm, Patient Position: Sitting)   Pulse 98   Temp 97.7 F (36.5 C) (Oral)   Resp 18   Ht 5\' 5"  (1.651 m)   Wt 178 lb 4.8 oz (80.9 kg)   SpO2 94%   BMI 29.67 kg/m  ECOG: 0  Physical Exam  Constitutional: No distress.  HENT:  Head: Normocephalic and atraumatic.  Mouth/Throat: Oropharynx is clear and moist. No oropharyngeal exudate.  Tympanic membranes are hazy and injected bilaterally.  Neck: Normal range of motion. Neck supple.  Cardiovascular: Normal rate, regular rhythm and normal heart sounds. Exam reveals no gallop and no friction rub.  No murmur heard. Pulmonary/Chest: Effort normal. No respiratory distress. She has no wheezes.  Bibasilar crackles noted.  Lymphadenopathy:    She has no cervical adenopathy.  Neurological: She is alert.  Coordination normal.  Skin: Skin is warm and dry. No rash noted. She is not diaphoretic. No erythema.  Psychiatric: She has a normal mood and affect. Her behavior is normal. Judgment and thought content  normal.    Lab Review:     Component Value Date/Time   NA 141 10/07/2018 0806   NA 124 (L) 07/16/2018 0853   K 4.2 10/07/2018 0806   CL 105 10/07/2018 0806   CO2 28 10/07/2018 0806   GLUCOSE 100 (H) 10/07/2018 0806   BUN 9 10/07/2018 0806   BUN 8 07/16/2018 0853   CREATININE 0.69 10/07/2018 0806   CREATININE 0.68 02/13/2013 1523   CALCIUM 9.4 10/07/2018 0806   PROT 7.3 10/07/2018 0806   PROT 6.5 07/16/2018 0853   ALBUMIN 3.4 (L) 10/07/2018 0806   ALBUMIN 3.1 (L) 07/16/2018 0853   AST 33 10/07/2018 0806   ALT 22 10/07/2018 0806   ALKPHOS 254 (H) 10/07/2018 0806   BILITOT 0.3 10/07/2018 0806   GFRNONAA >60 10/07/2018 0806   GFRNONAA >89 02/13/2013 1523   GFRAA >60 10/07/2018 0806   GFRAA >89 02/13/2013 1523       Component Value Date/Time   WBC 4.5 10/07/2018 0806   WBC 14.3 (H) 07/19/2018 0355   RBC 3.14 (L) 10/07/2018 0806   HGB 10.2 (L) 10/07/2018 0806   HGB 13.0 07/16/2018 0853   HCT 32.2 (L) 10/07/2018 0806   HCT 39.9 07/16/2018 0853   PLT 56 (L) 10/07/2018 0806   PLT 473 (H) 07/16/2018 0853   MCV 102.5 (H) 10/07/2018 0806   MCV 85 07/16/2018 0853   MCH 32.5 10/07/2018 0806   MCHC 31.7 10/07/2018 0806   RDW 18.7 (H) 10/07/2018 0806   RDW 15.7 (H) 07/16/2018 0853   LYMPHSABS 2.2 10/07/2018 0806   LYMPHSABS 2.2 07/16/2018 0853   MONOABS 0.5 10/07/2018 0806   EOSABS 0.1 10/07/2018 0806   EOSABS 0.0 07/16/2018 0853   BASOSABS 0.0 10/07/2018 0806   BASOSABS 0.1 07/16/2018 0853   -------------------------------  Imaging from last 24 hours (if applicable):  Radiology interpretation: No results found.

## 2018-10-08 ENCOUNTER — Encounter: Payer: Self-pay | Admitting: Internal Medicine

## 2018-10-08 ENCOUNTER — Ambulatory Visit: Payer: Medicare Other | Admitting: Internal Medicine

## 2018-10-08 VITALS — BP 134/66 | HR 111 | Ht 64.0 in | Wt 179.4 lb

## 2018-10-08 DIAGNOSIS — E8801 Alpha-1-antitrypsin deficiency: Secondary | ICD-10-CM

## 2018-10-08 DIAGNOSIS — C3492 Malignant neoplasm of unspecified part of left bronchus or lung: Secondary | ICD-10-CM

## 2018-10-08 DIAGNOSIS — J449 Chronic obstructive pulmonary disease, unspecified: Secondary | ICD-10-CM

## 2018-10-08 NOTE — Patient Instructions (Signed)
Please call if we can help

## 2018-10-08 NOTE — Assessment & Plan Note (Signed)
Continues chemotherapy, managed by oncology

## 2018-10-08 NOTE — Assessment & Plan Note (Signed)
Initial chemotherapy response has opened up some lung.  She has not had a recent exacerbation and is satisfied with medications.

## 2018-10-08 NOTE — Progress Notes (Signed)
HPI  89 yoF former smoker on replacement for a1AT deficiency, with severe COPD, complicated by OSA and fibromyalgia. PFT in 2014 showed moderate obstructive airways disease with normal diffusion Zemaira alpha 1 replacement NPSG 2009-Minimal OSA AHI 6.9/hour, not treated PFT 12/13/17-moderately severe obstructive airways disease, airtrapping, diffusion mildly reduced, slight response to dilator.  FVC 1.92/54%, FEV1 1.43/52%, ratio 0.75, FEF 25-75% 1.62/66%, TLC 97%, DLCO 75%. -------------------------------------------------------------------------------------  06/03/2018-62 year old female former smoker on replacement for off a-1- antitrypsin deficiency with severe COPD, ?ILD, complicated by mild OSA/not treated, fibromyalgia,  Prolastin-alpha-1 replacement Symbicort 80, Spiriva, pro-air, nebulizer albuterol -----Alpha 1 and COPD mixed type: Pt states she is having increased sinus and chest congestion-yellow in color as well as wheezing.   Bevespi,  pro-air, nebulizer albuterol No problems with change to Prolastin/different administration company. Bevespi does not seem to last 12 hours for her. Acute exacerbation over the past week with sinus drainage, low-grade fever, cough productive yellow, no GI upset or blood.  10/08/2018- 62 year old female former smoker on replacement for off a-1- antitrypsin deficiency with severe COPD, ?ILD, complicated by mild OSA/not treated, fibromyalgia,  Prolastin-alpha-1 replacement O2 2L/ [facility]- at home if needed Flonase, Bevespi, neb albuterol, About to start her next round of chemotherapy next week and has had some response.  She feels she is doing well.  Admits to dry cough.  Currently on an antibiotic.  Has no longer been needing oxygen but does have a pulse oximeter at home and still has her oxygen at home/[facility] to use if needed. CT chest 08/26/18 IMPRESSION: Chest Impression: 1. Marked improvement in the LEFT infrahilar mass and re-expansion of the  LEFT lower lobe. 2. Marked improvement in mediastinal lymphadenopathy. 3. Resolution in the patchy nodules in the RIGHT upper lobe similar comparison exam. New nodularity in the RIGHT lower lobe is favored infectious. Recommend attention on follow-up. 4. 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  ROS-see HPI   + = positive Constitutional:  + weight loss, night sweats, fevers, chills, fatigue, lassitude. HEENT:   No-  headaches, difficulty swallowing, tooth/dental problems, sore throat,       No-  sneezing, itching, ear ache, nasal congestion, post nasal drip,  CV:  No-   chest pain, orthopnea, PND, swelling in lower extremities, anasarca, dizziness, palpitations Resp: +shortness of breath with exertion or at rest.              +-productive cough,  +non-productive cough,  No- coughing up of blood.             change in color of mucus.  No- wheezing.   Skin: No-   rash or lesions. GI:  No-   heartburn, indigestion, abdominal pain, nausea, vomiting, GU:  MS:  No-   joint pain or swelling.   Neuro-     nothing unusual Psych:  No- change in mood or affect. No depression or anxiety.  No memory loss.   Objective:   Physical Exam    Stable baseline exam General- Alert, Oriented, Affect-appropriate, Distress- none acute, + weight obese Skin- rash-none, lesions- none, excoriation- none Lymphadenopathy- none Head- atraumatic            Eyes- Gross vision intact, PERRLA, conjunctivae clear secretions            Ears- Hearing, canals-normal            Nose- congested, no-Septal dev, mucus, polyps, erosion, perforation             Throat- Mallampati  II , mucosa clear , drainage- none,                              tonsils- atrophic, +dentures Neck- flexible , trachea midline, no stridor , thyroid nl, carotid no bruit Chest - symmetrical excursion , unlabored           Heart/CV- RRR , no murmur , no gallop  , no rub, nl s1 s2                           - JVD- none , edema- none,  stasis changes- none, varices- none           Lung- cough+ None ,  rhonchi-None, dullness-none, rub- none. Unlabored, + crackles coarse-in bases           Chest wall- + port-a-cath R anterior chest  Abd-  Br/ Gen/ Rectal- Not done, not indicated Extrem- cyanosis- none, clubbing, none, atrophy- none, strength- nl Neuro- grossly intact to observation

## 2018-10-08 NOTE — Assessment & Plan Note (Signed)
Continues long-term replacement therapy

## 2018-10-11 ENCOUNTER — Ambulatory Visit (HOSPITAL_COMMUNITY)
Admission: RE | Admit: 2018-10-11 | Discharge: 2018-10-11 | Disposition: A | Discharge status: Home / Self Care | Payer: Medicare Other | Source: Ambulatory Visit | Attending: Internal Medicine | Admitting: Internal Medicine

## 2018-10-11 ENCOUNTER — Other Ambulatory Visit: Payer: Self-pay | Admitting: Oncology

## 2018-10-11 DIAGNOSIS — C3492 Malignant neoplasm of unspecified part of left bronchus or lung: Secondary | ICD-10-CM | POA: Insufficient documentation

## 2018-10-11 DIAGNOSIS — C3490 Malignant neoplasm of unspecified part of unspecified bronchus or lung: Secondary | ICD-10-CM | POA: Diagnosis not present

## 2018-10-11 MED ORDER — SODIUM CHLORIDE (PF) 0.9 % IJ SOLN
INTRAMUSCULAR | Status: AC
  Filled 2018-10-11: qty 50

## 2018-10-11 MED ORDER — IOHEXOL 300 MG/ML  SOLN
100.0000 mL | Freq: Once | INTRAMUSCULAR | Status: AC | PRN
  Administered 2018-10-11: 100 mL via INTRAVENOUS

## 2018-10-14 ENCOUNTER — Encounter: Payer: Self-pay | Admitting: Internal Medicine

## 2018-10-14 ENCOUNTER — Inpatient Hospital Stay: Payer: Medicare Other

## 2018-10-14 ENCOUNTER — Ambulatory Visit: Payer: Medicare Other

## 2018-10-14 ENCOUNTER — Inpatient Hospital Stay (HOSPITAL_BASED_OUTPATIENT_CLINIC_OR_DEPARTMENT_OTHER): Payer: Medicare Other | Admitting: Internal Medicine

## 2018-10-14 VITALS — BP 138/70 | HR 94 | Temp 98.5°F | Resp 18 | Ht 64.0 in | Wt 184.0 lb

## 2018-10-14 DIAGNOSIS — J029 Acute pharyngitis, unspecified: Secondary | ICD-10-CM

## 2018-10-14 DIAGNOSIS — Z95828 Presence of other vascular implants and grafts: Secondary | ICD-10-CM

## 2018-10-14 DIAGNOSIS — Z79899 Other long term (current) drug therapy: Secondary | ICD-10-CM

## 2018-10-14 DIAGNOSIS — C3412 Malignant neoplasm of upper lobe, left bronchus or lung: Secondary | ICD-10-CM

## 2018-10-14 DIAGNOSIS — Z5111 Encounter for antineoplastic chemotherapy: Secondary | ICD-10-CM | POA: Diagnosis not present

## 2018-10-14 DIAGNOSIS — C7800 Secondary malignant neoplasm of unspecified lung: Secondary | ICD-10-CM | POA: Diagnosis not present

## 2018-10-14 DIAGNOSIS — C3492 Malignant neoplasm of unspecified part of left bronchus or lung: Secondary | ICD-10-CM

## 2018-10-14 DIAGNOSIS — H65193 Other acute nonsuppurative otitis media, bilateral: Secondary | ICD-10-CM | POA: Diagnosis not present

## 2018-10-14 DIAGNOSIS — Z5112 Encounter for antineoplastic immunotherapy: Secondary | ICD-10-CM

## 2018-10-14 DIAGNOSIS — C787 Secondary malignant neoplasm of liver and intrahepatic bile duct: Secondary | ICD-10-CM | POA: Diagnosis not present

## 2018-10-14 DIAGNOSIS — C7951 Secondary malignant neoplasm of bone: Secondary | ICD-10-CM | POA: Diagnosis not present

## 2018-10-14 DIAGNOSIS — R5382 Chronic fatigue, unspecified: Secondary | ICD-10-CM

## 2018-10-14 LAB — CMP (CANCER CENTER ONLY)
ALT: 21 U/L (ref 0–44)
AST: 39 U/L (ref 15–41)
Albumin: 3.5 g/dL (ref 3.5–5.0)
Alkaline Phosphatase: 248 U/L — ABNORMAL HIGH (ref 38–126)
Anion gap: 9 (ref 5–15)
BUN: 8 mg/dL (ref 6–20)
CHLORIDE: 105 mmol/L (ref 98–111)
CO2: 28 mmol/L (ref 22–32)
CREATININE: 0.63 mg/dL (ref 0.44–1.00)
Calcium: 9.2 mg/dL (ref 8.9–10.3)
Glucose, Bld: 105 mg/dL — ABNORMAL HIGH (ref 70–99)
Potassium: 4.1 mmol/L (ref 3.5–5.1)
SODIUM: 142 mmol/L (ref 135–145)
Total Bilirubin: 0.3 mg/dL (ref 0.3–1.2)
Total Protein: 7.5 g/dL (ref 6.5–8.1)

## 2018-10-14 LAB — CBC WITH DIFFERENTIAL (CANCER CENTER ONLY)
Abs Immature Granulocytes: 0.01 10*3/uL (ref 0.00–0.07)
Basophils Absolute: 0 10*3/uL (ref 0.0–0.1)
Basophils Relative: 1 %
EOS PCT: 3 %
Eosinophils Absolute: 0.1 10*3/uL (ref 0.0–0.5)
HCT: 34.5 % — ABNORMAL LOW (ref 36.0–46.0)
Hemoglobin: 10.9 g/dL — ABNORMAL LOW (ref 12.0–15.0)
Immature Granulocytes: 0 %
LYMPHS PCT: 40 %
Lymphs Abs: 2 10*3/uL (ref 0.7–4.0)
MCH: 33 pg (ref 26.0–34.0)
MCHC: 31.6 g/dL (ref 30.0–36.0)
MCV: 104.5 fL — ABNORMAL HIGH (ref 80.0–100.0)
MONO ABS: 0.7 10*3/uL (ref 0.1–1.0)
Monocytes Relative: 15 %
Neutro Abs: 2 10*3/uL (ref 1.7–7.7)
Neutrophils Relative %: 41 %
Platelet Count: 166 10*3/uL (ref 150–400)
RBC: 3.3 MIL/uL — ABNORMAL LOW (ref 3.87–5.11)
RDW: 17.7 % — ABNORMAL HIGH (ref 11.5–15.5)
WBC: 4.8 10*3/uL (ref 4.0–10.5)
nRBC: 0 % (ref 0.0–0.2)

## 2018-10-14 LAB — TSH: TSH: 2.82 u[IU]/mL (ref 0.308–3.960)

## 2018-10-14 MED ORDER — SODIUM CHLORIDE 0.9 % IV SOLN
100.0000 mg/m2 | Freq: Once | INTRAVENOUS | Status: AC
  Administered 2018-10-14: 190 mg via INTRAVENOUS
  Filled 2018-10-14: qty 9.5

## 2018-10-14 MED ORDER — SODIUM CHLORIDE 0.9 % IV SOLN
Freq: Once | INTRAVENOUS | Status: AC
  Administered 2018-10-14: 10:00:00 via INTRAVENOUS
  Filled 2018-10-14: qty 5

## 2018-10-14 MED ORDER — SODIUM CHLORIDE 0.9% FLUSH
10.0000 mL | INTRAVENOUS | Status: DC | PRN
  Administered 2018-10-14: 10 mL
  Filled 2018-10-14: qty 10

## 2018-10-14 MED ORDER — SODIUM CHLORIDE 0.9 % IV SOLN
Freq: Once | INTRAVENOUS | Status: AC
  Administered 2018-10-14: 09:00:00 via INTRAVENOUS
  Filled 2018-10-14: qty 250

## 2018-10-14 MED ORDER — PALONOSETRON HCL INJECTION 0.25 MG/5ML
0.2500 mg | Freq: Once | INTRAVENOUS | Status: AC
  Administered 2018-10-14: 0.25 mg via INTRAVENOUS

## 2018-10-14 MED ORDER — PALONOSETRON HCL INJECTION 0.25 MG/5ML
INTRAVENOUS | Status: AC
  Filled 2018-10-14: qty 5

## 2018-10-14 MED ORDER — HEPARIN SOD (PORK) LOCK FLUSH 100 UNIT/ML IV SOLN
500.0000 [IU] | Freq: Once | INTRAVENOUS | Status: AC | PRN
  Administered 2018-10-14: 500 [IU]
  Filled 2018-10-14: qty 5

## 2018-10-14 MED ORDER — SODIUM CHLORIDE 0.9 % IV SOLN
571.5000 mg | Freq: Once | INTRAVENOUS | Status: AC
  Administered 2018-10-14: 570 mg via INTRAVENOUS
  Filled 2018-10-14: qty 57

## 2018-10-14 MED ORDER — SODIUM CHLORIDE 0.9 % IV SOLN
1200.0000 mg | Freq: Once | INTRAVENOUS | Status: AC
  Administered 2018-10-14: 1200 mg via INTRAVENOUS
  Filled 2018-10-14: qty 20

## 2018-10-14 NOTE — Patient Instructions (Signed)
[name] Discharge Instructions for Patients Receiving Chemotherapy  Today you received the following chemotherapy agents Tecentriq, Carboplatin, Etoposide  To help prevent nausea and vomiting after your treatment, we encourage you to take your nausea medication as prescribed If you develop nausea and vomiting that is not controlled by your nausea medication, call the clinic.   BELOW ARE SYMPTOMS THAT SHOULD BE REPORTED IMMEDIATELY:  *FEVER GREATER THAN 100.5 F  *CHILLS WITH OR WITHOUT FEVER  NAUSEA AND VOMITING THAT IS NOT CONTROLLED WITH YOUR NAUSEA MEDICATION  *UNUSUAL SHORTNESS OF BREATH  *UNUSUAL BRUISING OR BLEEDING  TENDERNESS IN MOUTH AND THROAT WITH OR WITHOUT PRESENCE OF ULCERS  *URINARY PROBLEMS  *BOWEL PROBLEMS  UNUSUAL RASH Items with * indicate a potential emergency and should be followed up as soon as possible.  Feel free to call the clinic should you have any questions or concerns. The clinic phone number is [phone].  Please show the [place] at check-in to the Emergency Department and triage nurse.

## 2018-10-14 NOTE — Progress Notes (Signed)
[facility] [phone]   [phone]  OFFICE PROGRESS NOTE  [name], MD [place] [address]  DIAGNOSIS: Extensive stage (T3, N2, M1a)  small cell lung cancer diagnosed in August 2019 and presented with large left hilar/subhilar mass in addition to mediastinal lymphadenopathy and contracture and mass in the right upper lobe.  PRIOR THERAPY:None  CURRENT THERAPY: Therapy with carboplatin for AUC of 5 on day 1, etoposide 100 mg/M2 on days 1, 2 and 3 as well as Tecentriq (Atezolizumab) 1200 mg IV every 3 weeks with Neulasta support.  Status post 4 cycles.  INTERVAL HISTORY: [name] 62 y.o. female returns to the clinic today for follow-up visit accompanied by her husband.  The patient is feeling fine today with no concerning complaints except for mild cough.  She denied having any chest pain, shortness of breath or hemoptysis.  She denied having any significant weight loss or night sweats.  She has no nausea, vomiting, diarrhea or constipation.  She has no headache or visual changes.  She continues to tolerate her treatment with carboplatin, etoposide and Tecentriq fairly well.  The patient is here today for evaluation with repeat CT scan of the chest, abdomen and pelvis for restaging of her disease.  MEDICAL HISTORY: Past Medical History:  Diagnosis Date  . Alpha-1-antitrypsin deficiency ([place])   . Cervical cancer ([place]) 2004  . 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 This doesn't fit entirely with emphysema.      . Exposure to hepatitis C 09/12/2016  . Fibromyalgia   . GERD (gastroesophageal reflux disease)   . Hyperlipidemia   . IBS (irritable bowel syndrome)   . Obstructive sleep apnea 06/02/2008   NPSG 06/07/08, AHI 6.9/ hr, weight 220 lbs    .  Osteoporosis   . Pulmonary fibrosis, unspecified ([place]) 09/29/2016   CXR 03/28/2016  . Rhinitis, nonallergic 05/08/2012  . 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 Vitamin D deficiency     ALLERGIES:  is allergic to penicillins; actonel [risedronate sodium]; advair diskus [fluticasone-salmeterol]; alendronate sodium; aspirin; azithromycin; keflex [cephalexin]; levofloxacin; morphine; nsaids; omnicef [cefdinir]; erythromycin; and iodine.  MEDICATIONS:  Current Outpatient Medications  Medication Sig Dispense Refill  . albuterol (PROVENTIL) (2.5 MG/3ML) 0.083% nebulizer solution Take 3 mLs (2.5 mg total) by nebulization every 6 (six) hours as needed. 90 mL 4  . allopurinol (ZYLOPRIM) 300 MG tablet Take 1 tablet (300 mg total) by mouth daily. Begin on 07/25/2018 14 tablet 0  . amitriptyline (ELAVIL) 75 MG tablet Take 75 mg by mouth at bedtime.      . benzonatate (TESSALON) 100 MG capsule Take 1 capsule (100 mg total) by mouth 3 (three) times daily as needed for cough. 20 capsule 0  . ezetimibe (ZETIA) 10 MG tablet TAKE 1 TABLET ONCE A DAY 90 tablet 1  . feeding supplement, ENSURE ENLIVE, (ENSURE ENLIVE) LIQD Take 237 mLs by mouth 2 (two) times daily between meals. 60 Bottle 0  . fluticasone (FLONASE) 50 MCG/ACT nasal spray USE 2 SPRAYS IN EACH NOSTRIL ONCE DAILY. 16 g 5  . folic acid (FOLVITE) 1 MG tablet Take 1 mg by mouth daily.    . Glycopyrrolate-Formoterol (BEVESPI AEROSPHERE) 9-4.8 MCG/ACT AERO Inhale 2 puffs into the lungs 2 (two) times  daily. 1 Inhaler 12  . HYDROcodone-acetaminophen (NORCO/VICODIN) 5-325 MG tablet Take 1 tablet by mouth 3 (three) times daily. 30 tablet 0  . lidocaine-prilocaine (EMLA) cream Apply 1 application topically as needed. 30 g 0  . Nebulizers (COMPRESSOR NEBULIZER) MISC 1 Device by Does not apply route as needed. 1 each prn  . omeprazole (PRILOSEC) 40 MG capsule TAKE (1) CAPSULE DAILY (Patient taking differently: Take 40 mg by  mouth daily. ) 90 capsule 1  . prochlorperazine (COMPAZINE) 10 MG tablet Take 1 tablet (10 mg total) by mouth every 6 (six) hours as needed for nausea or vomiting. 30 tablet 0  . rosuvastatin (CRESTOR) 40 MG tablet TAKE 1/2 TABLET ONCE DAILY (Patient taking differently: Take 20 mg by mouth daily. ) 45 tablet 1  . sulfamethoxazole-trimethoprim (BACTRIM DS,SEPTRA DS) 800-160 MG tablet Take 1 tablet by mouth 2 (two) times daily. 14 tablet 0   No current facility-administered medications for this visit.    Facility-Administered Medications Ordered in Other Visits  Medication Dose Route Frequency Provider Last Rate Last Dose  . sodium chloride flush (NS) 0.9 % injection 10 mL  10 mL Intracatheter PRN Curt Bears, MD   10 mL at 10/14/18 9937    SURGICAL HISTORY:  Past Surgical History:  Procedure Laterality Date  . ABDOMINAL HYSTERECTOMY    . IR IMAGING GUIDED PORT INSERTION  08/02/2018  . LUMBAR DISC SURGERY    . VIDEO BRONCHOSCOPY Bilateral 07/10/2018   Procedure: VIDEO BRONCHOSCOPY WITHOUT FLUORO;  Surgeon: Laurin Coder, MD;  Location: [facility];  Service: Cardiopulmonary;  Laterality: Bilateral;    REVIEW OF SYSTEMS:  Constitutional: positive for fatigue Eyes: negative Ears, nose, mouth, throat, and face: negative Respiratory: positive for cough Cardiovascular: negative Gastrointestinal: negative Genitourinary:negative Integument/breast: negative Hematologic/lymphatic: negative Musculoskeletal:negative Neurological: negative Behavioral/Psych: negative Endocrine: negative Allergic/Immunologic: negative   PHYSICAL EXAMINATION: General appearance: alert, cooperative, fatigued and no distress Head: Normocephalic, without obvious abnormality, atraumatic Neck: no adenopathy, no JVD, supple, symmetrical, trachea midline and thyroid not enlarged, symmetric, no tenderness/mass/nodules Lymph nodes: Cervical, supraclavicular, and axillary nodes normal. Resp: clear to  auscultation bilaterally Back: symmetric, no curvature. ROM normal. No CVA tenderness. Cardio: regular rate and rhythm, S1, S2 normal, no murmur, click, rub or gallop GI: soft, non-tender; bowel sounds normal; no masses,  no organomegaly Extremities: extremities normal, atraumatic, no cyanosis or edema Neurologic: Alert and oriented X 3, normal strength and tone. Normal symmetric reflexes. Normal coordination and gait  ECOG PERFORMANCE STATUS: 1 - Symptomatic but completely ambulatory  Blood pressure 138/70, pulse 94, temperature 98.5 F (36.9 C), temperature source Oral, resp. rate 18, height 5\' 4"  (1.626 m), weight 184 lb (83.5 kg), SpO2 94 %.  LABORATORY DATA: Lab Results  Component Value Date   WBC 4.5 10/07/2018   HGB 10.2 (L) 10/07/2018   HCT 32.2 (L) 10/07/2018   MCV 102.5 (H) 10/07/2018   PLT 56 (L) 10/07/2018      Chemistry      Component Value Date/Time   NA 141 10/07/2018 0806   NA 124 (L) 07/16/2018 0853   K 4.2 10/07/2018 0806   CL 105 10/07/2018 0806   CO2 28 10/07/2018 0806   BUN 9 10/07/2018 0806   BUN 8 07/16/2018 0853   CREATININE 0.69 10/07/2018 0806   CREATININE 0.68 02/13/2013 1523      Component Value Date/Time   CALCIUM 9.4 10/07/2018 0806   ALKPHOS 254 (H) 10/07/2018 0806   AST 33 10/07/2018 0806   ALT 22 10/07/2018  8850   BILITOT 0.3 10/07/2018 0806       RADIOGRAPHIC STUDIES: Ct Chest W Contrast  Result Date: 10/11/2018 CLINICAL DATA:  Metastatic small cell lung cancer restaging EXAM: CT CHEST, ABDOMEN, AND PELVIS WITH CONTRAST TECHNIQUE: Multidetector CT imaging of the chest, abdomen and pelvis was performed following the standard protocol during bolus administration of intravenous contrast. CONTRAST:  120mL OMNIPAQUE IOHEXOL 300 MG/ML  SOLN COMPARISON:  Multiple exams, including 08/26/2018 FINDINGS: CT CHEST FINDINGS Cardiovascular: Right Port-A-Cath tip: Right atrium. Coronary, aortic arch, and branch vessel atherosclerotic vascular  disease. Mediastinum/Nodes: Prevascular node 0.7 cm in short axis on image 14/2, stable. Left lower paratracheal node 1.1 cm in short axis on image 18/2, formerly 1.3 cm. Left infrahilar node 0.8 cm in short axis on image 24/2, formerly 1.0 cm. Further reduction in the previous left infrahilar mass which currently is appreciable only as mild peribronchovascular nodularity resembling lymph nodes. Lungs/Pleura: Paraseptal emphysema. Right basilar scarring. Airway thickening is present, suggesting bronchitis or reactive airways disease. Calcified granuloma in the superior segment right lower lobe. Reduced atelectasis in the left lower lobe although there is some residual atelectasis or scarring. The previously clustered nodules in the right lower lobe have resolved. Musculoskeletal: Left lateral ninth rib fracture with some healing response, without current union. Indistinct sclerotic lesions at T3, T5, T8, and T10, not appreciably changed from prior CT ABDOMEN PELVIS FINDINGS Hepatobiliary: Hepatic cirrhosis. Scattered hypodense lesions are observed in the liver with distribution similar to the prior exam; these were hypermetabolic on a previous PET-CT compatible with metastatic lesions. For the most part these lesions appear stable compared to the prior exam, with a left hepatic lobe lesion measuring 9 mm transverse on image 55/2, and the same previously. Gallbladder unremarkable.  No biliary dilatation. Pancreas: Unremarkable Spleen: Unremarkable Adrenals/Urinary Tract: The patchy hypodensities in the right kidney are essentially resolved. There may be a subtle residuum of the lower pole hypodensity measuring about 9 mm in diameter on image 27/7 but this is uncertain, and previously the hypodensity in this vicinity measured 1.8 by 2.3 cm and was well-defined. Stomach/Bowel: Unremarkable Vascular/Lymphatic: Gastrohepatic ligament node 1.0 cm in short axis, previously 1.1 cm, probably reactive. Similar small reactive  lymph nodes in the porta hepatis. Aortoiliac atherosclerotic vascular disease. Prior lymph node dissections in the pelvis. Right external iliac node 1.0 cm in short axis on image 107/2, formerly the same. Reproductive: Uterus absent.  Adnexa unremarkable. Other: No supplemental non-categorized findings. Musculoskeletal: Ill-defined sclerotic lesion in the right iliac bone adjacent to the SI joint, stable in size at 2.5 by 1.7 cm. Similar stable faintly sclerotic lesions in the lumbar spine and left iliac bone. 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 Threaded interbody fusion cages are in place at L4-5, with resected inferior facets at L4 and no subluxation. IMPRESSION: 1. Reduced thoracic adenopathy and further reduction in the left infrahilar mass, which today manifest primarily as peribronchovascular nodularity resembling clustered nodal disease. 2. Stable scattered sclerotic bony lesions in the chest, abdomen, and pelvis compatible with previous metastatic disease. No progression or change of the bony involvement. 3. Hepatic cirrhosis with multiple hypodense hepatic lesions which are likewise unchanged in size and appearance. 4. Resolution of the nodularity in the right lower lobe. 5. Resolution of prior hypodense lesions in the right kidney, with only equivocal minimal hypodensity appreciable along the right kidney lower pole, significantly reduced in conspicuity and size. 6. Other imaging findings  of potential clinical significance: Aortic Atherosclerosis (ICD10-I70.0). Coronary atherosclerosis. Emphysema (ICD10-J43.9). Airway thickening is present, suggesting bronchitis or reactive airways disease. Later phase healing of a left lateral ninth rib fracture. Upper abdominal borderline enlarged lymph nodes are likely reactive to the cirrhosis. Electronically Signed   By: Van Clines M.D.   On: 10/11/2018 17:10   Ct Abdomen Pelvis W  Contrast  Result Date: 10/11/2018 CLINICAL DATA:  Metastatic small cell lung cancer restaging EXAM: CT CHEST, ABDOMEN, AND PELVIS WITH CONTRAST TECHNIQUE: Multidetector CT imaging of the chest, abdomen and pelvis was performed following the standard protocol during bolus administration of intravenous contrast. CONTRAST:  130mL OMNIPAQUE IOHEXOL 300 MG/ML  SOLN COMPARISON:  Multiple exams, including 08/26/2018 FINDINGS: CT CHEST FINDINGS Cardiovascular: Right Port-A-Cath tip: Right atrium. Coronary, aortic arch, and branch vessel atherosclerotic vascular disease. Mediastinum/Nodes: Prevascular node 0.7 cm in short axis on image 14/2, stable. Left lower paratracheal node 1.1 cm in short axis on image 18/2, formerly 1.3 cm. Left infrahilar node 0.8 cm in short axis on image 24/2, formerly 1.0 cm. Further reduction in the previous left infrahilar mass which currently is appreciable only as mild peribronchovascular nodularity resembling lymph nodes. Lungs/Pleura: Paraseptal emphysema. Right basilar scarring. Airway thickening is present, suggesting bronchitis or reactive airways disease. Calcified granuloma in the superior segment right lower lobe. Reduced atelectasis in the left lower lobe although there is some residual atelectasis or scarring. The previously clustered nodules in the right lower lobe have resolved. Musculoskeletal: Left lateral ninth rib fracture with some healing response, without current union. Indistinct sclerotic lesions at T3, T5, T8, and T10, not appreciably changed from prior CT ABDOMEN PELVIS FINDINGS Hepatobiliary: Hepatic cirrhosis. Scattered hypodense lesions are observed in the liver with distribution similar to the prior exam; these were hypermetabolic on a previous PET-CT compatible with metastatic lesions. For the most part these lesions appear stable compared to the prior exam, with a left hepatic lobe lesion measuring 9 mm transverse on image 55/2, and the same previously.  Gallbladder unremarkable.  No biliary dilatation. Pancreas: Unremarkable Spleen: Unremarkable Adrenals/Urinary Tract: The patchy hypodensities in the right kidney are essentially resolved. There may be a subtle residuum of the lower pole hypodensity measuring about 9 mm in diameter on image 27/7 but this is uncertain, and previously the hypodensity in this vicinity measured 1.8 by 2.3 cm and was well-defined. Stomach/Bowel: Unremarkable Vascular/Lymphatic: Gastrohepatic ligament node 1.0 cm in short axis, previously 1.1 cm, probably reactive. Similar small reactive lymph nodes in the porta hepatis. Aortoiliac atherosclerotic vascular disease. Prior lymph node dissections in the pelvis. Right external iliac node 1.0 cm in short axis on image 107/2, formerly the same. Reproductive: Uterus absent.  Adnexa unremarkable. Other: No supplemental non-categorized findings. Musculoskeletal: Ill-defined sclerotic lesion in the right iliac bone adjacent to the SI joint, stable in size at 2.5 by 1.7 cm. Similar stable faintly sclerotic lesions in the lumbar spine and left iliac bone. 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 Threaded interbody fusion cages are in place at L4-5, with resected inferior facets at L4 and no subluxation. IMPRESSION: 1. Reduced thoracic adenopathy and further reduction in the left infrahilar mass, which today manifest primarily as peribronchovascular nodularity resembling clustered nodal disease. 2. Stable scattered sclerotic bony lesions in the chest, abdomen, and pelvis compatible with previous metastatic disease. No progression or change of the bony involvement. 3. Hepatic cirrhosis with multiple hypodense hepatic lesions which are likewise unchanged in  size and appearance. 4. Resolution of the nodularity in the right lower lobe. 5. Resolution of prior hypodense lesions in the right kidney, with only equivocal minimal hypodensity  appreciable along the right kidney lower pole, significantly reduced in conspicuity and size. 6. Other imaging findings of potential clinical significance: Aortic Atherosclerosis (ICD10-I70.0). Coronary atherosclerosis. Emphysema (ICD10-J43.9). Airway thickening is present, suggesting bronchitis or reactive airways disease. Later phase healing of a left lateral ninth rib fracture. Upper abdominal borderline enlarged lymph nodes are likely reactive to the cirrhosis. Electronically Signed   By: Van Clines M.D.   On: 10/11/2018 17:10    ASSESSMENT AND PLAN: This is a very pleasant 62 years old white female recently diagnosed with extensive stage small cell lung cancer.  The patient had a recent PET scan that showed multiple metastatic disease in the lung, liver as well as bone. She was supposed to have MRI of the brain performed last week but this was not scheduled for unclear reason.  I will arrange for the patient to have MRI of the brain performed in the next few days. The patient is currently on systemic chemotherapy with carboplatin, etoposide and Tecentriq status post 4 cycles. The patient is tolerating her treatment well with no concerning adverse effects. She had repeat CT scan of the chest, abdomen and pelvis performed recently.  I personally and independently reviewed the scan images and discussed the result with the patient and her husband.  Her scan showed no concerning findings for disease progression and she continues to have improvement of her disease. I recommended for the patient to proceed with 2 more cycles of the same regimen followed by maintenance treatment if she has no evidence for disease progression after cycle #6. She will proceed with cycle #5 today. I will see her back for follow-up visit in 3 weeks for evaluation before starting cycle #6. The patient was advised to call immediately if she has any concerning symptoms in the interval. The patient voices understanding of  current disease status and treatment options and is in agreement with the current care plan.  All questions were answered. The patient knows to call the clinic with any problems, questions or concerns. We can certainly see the patient much sooner if necessary.  Disclaimer: This note was dictated with voice recognition software. Similar sounding words can inadvertently be transcribed and may not be corrected upon review.

## 2018-10-15 ENCOUNTER — Other Ambulatory Visit: Payer: Self-pay | Admitting: Family Medicine

## 2018-10-15 ENCOUNTER — Inpatient Hospital Stay: Payer: Medicare Other

## 2018-10-15 ENCOUNTER — Telehealth: Payer: Self-pay | Admitting: Internal Medicine

## 2018-10-15 VITALS — BP 122/66 | HR 91 | Temp 97.7°F | Resp 18

## 2018-10-15 DIAGNOSIS — C787 Secondary malignant neoplasm of liver and intrahepatic bile duct: Secondary | ICD-10-CM | POA: Diagnosis not present

## 2018-10-15 DIAGNOSIS — Z5111 Encounter for antineoplastic chemotherapy: Secondary | ICD-10-CM | POA: Diagnosis not present

## 2018-10-15 DIAGNOSIS — H65193 Other acute nonsuppurative otitis media, bilateral: Secondary | ICD-10-CM | POA: Diagnosis not present

## 2018-10-15 DIAGNOSIS — C7951 Secondary malignant neoplasm of bone: Secondary | ICD-10-CM | POA: Diagnosis not present

## 2018-10-15 DIAGNOSIS — Z79899 Other long term (current) drug therapy: Secondary | ICD-10-CM | POA: Diagnosis not present

## 2018-10-15 DIAGNOSIS — C7800 Secondary malignant neoplasm of unspecified lung: Secondary | ICD-10-CM | POA: Diagnosis not present

## 2018-10-15 DIAGNOSIS — C3492 Malignant neoplasm of unspecified part of left bronchus or lung: Secondary | ICD-10-CM

## 2018-10-15 DIAGNOSIS — C3412 Malignant neoplasm of upper lobe, left bronchus or lung: Secondary | ICD-10-CM | POA: Diagnosis not present

## 2018-10-15 DIAGNOSIS — J029 Acute pharyngitis, unspecified: Secondary | ICD-10-CM | POA: Diagnosis not present

## 2018-10-15 MED ORDER — DEXAMETHASONE SODIUM PHOSPHATE 10 MG/ML IJ SOLN
10.0000 mg | Freq: Once | INTRAMUSCULAR | Status: AC
  Administered 2018-10-15: 10 mg via INTRAVENOUS

## 2018-10-15 MED ORDER — HEPARIN SOD (PORK) LOCK FLUSH 100 UNIT/ML IV SOLN
500.0000 [IU] | Freq: Once | INTRAVENOUS | Status: AC | PRN
  Administered 2018-10-15: 500 [IU]
  Filled 2018-10-15: qty 5

## 2018-10-15 MED ORDER — SODIUM CHLORIDE 0.9 % IV SOLN
100.0000 mg/m2 | Freq: Once | INTRAVENOUS | Status: AC
  Administered 2018-10-15: 190 mg via INTRAVENOUS
  Filled 2018-10-15: qty 9.5

## 2018-10-15 MED ORDER — SODIUM CHLORIDE 0.9 % IV SOLN
Freq: Once | INTRAVENOUS | Status: AC
  Administered 2018-10-15: 08:00:00 via INTRAVENOUS
  Filled 2018-10-15: qty 250

## 2018-10-15 MED ORDER — SODIUM CHLORIDE 0.9% FLUSH
10.0000 mL | INTRAVENOUS | Status: DC | PRN
  Administered 2018-10-15: 10 mL
  Filled 2018-10-15: qty 10

## 2018-10-15 MED ORDER — DEXAMETHASONE SODIUM PHOSPHATE 10 MG/ML IJ SOLN
INTRAMUSCULAR | Status: AC
  Filled 2018-10-15: qty 1

## 2018-10-15 NOTE — Telephone Encounter (Signed)
apts already scheduled per 11/25 los - all treatments scheduled to the end of treatment plan.

## 2018-10-16 ENCOUNTER — Inpatient Hospital Stay: Payer: Medicare Other

## 2018-10-16 VITALS — BP 132/70 | HR 89 | Temp 98.7°F | Resp 18

## 2018-10-16 DIAGNOSIS — C3412 Malignant neoplasm of upper lobe, left bronchus or lung: Secondary | ICD-10-CM | POA: Diagnosis not present

## 2018-10-16 DIAGNOSIS — H65193 Other acute nonsuppurative otitis media, bilateral: Secondary | ICD-10-CM | POA: Diagnosis not present

## 2018-10-16 DIAGNOSIS — Z79899 Other long term (current) drug therapy: Secondary | ICD-10-CM | POA: Diagnosis not present

## 2018-10-16 DIAGNOSIS — Z5111 Encounter for antineoplastic chemotherapy: Secondary | ICD-10-CM | POA: Diagnosis not present

## 2018-10-16 DIAGNOSIS — C7800 Secondary malignant neoplasm of unspecified lung: Secondary | ICD-10-CM | POA: Diagnosis not present

## 2018-10-16 DIAGNOSIS — C7951 Secondary malignant neoplasm of bone: Secondary | ICD-10-CM | POA: Diagnosis not present

## 2018-10-16 DIAGNOSIS — C3492 Malignant neoplasm of unspecified part of left bronchus or lung: Secondary | ICD-10-CM

## 2018-10-16 DIAGNOSIS — J029 Acute pharyngitis, unspecified: Secondary | ICD-10-CM | POA: Diagnosis not present

## 2018-10-16 DIAGNOSIS — C787 Secondary malignant neoplasm of liver and intrahepatic bile duct: Secondary | ICD-10-CM | POA: Diagnosis not present

## 2018-10-16 MED ORDER — SODIUM CHLORIDE 0.9 % IV SOLN
Freq: Once | INTRAVENOUS | Status: AC
  Administered 2018-10-16: 08:00:00 via INTRAVENOUS
  Filled 2018-10-16: qty 250

## 2018-10-16 MED ORDER — SODIUM CHLORIDE 0.9 % IV SOLN
100.0000 mg/m2 | Freq: Once | INTRAVENOUS | Status: AC
  Administered 2018-10-16: 190 mg via INTRAVENOUS
  Filled 2018-10-16: qty 9.5

## 2018-10-16 MED ORDER — SODIUM CHLORIDE 0.9% FLUSH
10.0000 mL | INTRAVENOUS | Status: DC | PRN
  Administered 2018-10-16: 10 mL
  Filled 2018-10-16: qty 10

## 2018-10-16 MED ORDER — HEPARIN SOD (PORK) LOCK FLUSH 100 UNIT/ML IV SOLN
500.0000 [IU] | Freq: Once | INTRAVENOUS | Status: AC | PRN
  Administered 2018-10-16: 500 [IU]
  Filled 2018-10-16: qty 5

## 2018-10-16 MED ORDER — DEXAMETHASONE SODIUM PHOSPHATE 10 MG/ML IJ SOLN
10.0000 mg | Freq: Once | INTRAMUSCULAR | Status: AC
  Administered 2018-10-16: 10 mg via INTRAVENOUS

## 2018-10-16 MED ORDER — DEXAMETHASONE SODIUM PHOSPHATE 10 MG/ML IJ SOLN
INTRAMUSCULAR | Status: AC
  Filled 2018-10-16: qty 1

## 2018-10-16 NOTE — Patient Instructions (Signed)
[facility] Discharge Instructions for Patients Receiving Chemotherapy  Today you received the following chemotherapy agents Etoposide  To help prevent nausea and vomiting after your treatment, we encourage you to take your nausea medication as directed If you develop nausea and vomiting that is not controlled by your nausea medication, call the clinic.   BELOW ARE SYMPTOMS THAT SHOULD BE REPORTED IMMEDIATELY:  *FEVER GREATER THAN 100.5 F  *CHILLS WITH OR WITHOUT FEVER  NAUSEA AND VOMITING THAT IS NOT CONTROLLED WITH YOUR NAUSEA MEDICATION  *UNUSUAL SHORTNESS OF BREATH  *UNUSUAL BRUISING OR BLEEDING  TENDERNESS IN MOUTH AND THROAT WITH OR WITHOUT PRESENCE OF ULCERS  *URINARY PROBLEMS  *BOWEL PROBLEMS  UNUSUAL RASH Items with * indicate a potential emergency and should be followed up as soon as possible.  Feel free to call the clinic should you have any questions or concerns. The clinic phone number is [phone].  Please show the [facility] at check-in to the Emergency Department and triage nurse.

## 2018-10-18 ENCOUNTER — Inpatient Hospital Stay: Payer: Medicare Other

## 2018-10-18 DIAGNOSIS — C7800 Secondary malignant neoplasm of unspecified lung: Secondary | ICD-10-CM | POA: Diagnosis not present

## 2018-10-18 DIAGNOSIS — C3492 Malignant neoplasm of unspecified part of left bronchus or lung: Secondary | ICD-10-CM

## 2018-10-18 DIAGNOSIS — C787 Secondary malignant neoplasm of liver and intrahepatic bile duct: Secondary | ICD-10-CM | POA: Diagnosis not present

## 2018-10-18 DIAGNOSIS — J029 Acute pharyngitis, unspecified: Secondary | ICD-10-CM | POA: Diagnosis not present

## 2018-10-18 DIAGNOSIS — Z79899 Other long term (current) drug therapy: Secondary | ICD-10-CM | POA: Diagnosis not present

## 2018-10-18 DIAGNOSIS — H65193 Other acute nonsuppurative otitis media, bilateral: Secondary | ICD-10-CM | POA: Diagnosis not present

## 2018-10-18 DIAGNOSIS — C7951 Secondary malignant neoplasm of bone: Secondary | ICD-10-CM | POA: Diagnosis not present

## 2018-10-18 DIAGNOSIS — Z5111 Encounter for antineoplastic chemotherapy: Secondary | ICD-10-CM | POA: Diagnosis not present

## 2018-10-18 DIAGNOSIS — C3412 Malignant neoplasm of upper lobe, left bronchus or lung: Secondary | ICD-10-CM | POA: Diagnosis not present

## 2018-10-18 MED ORDER — PEGFILGRASTIM-CBQV 6 MG/0.6ML ~~LOC~~ SOSY
6.0000 mg | PREFILLED_SYRINGE | Freq: Once | SUBCUTANEOUS | Status: AC
  Administered 2018-10-18: 6 mg via SUBCUTANEOUS

## 2018-10-18 MED ORDER — PEGFILGRASTIM-CBQV 6 MG/0.6ML ~~LOC~~ SOSY
PREFILLED_SYRINGE | SUBCUTANEOUS | Status: AC
  Filled 2018-10-18: qty 0.6

## 2018-10-18 NOTE — Patient Instructions (Signed)
Pegfilgrastim injection Ellen Henri)  What is this medicine? PEGFILGRASTIM (PEG fil gra stim) is a long-acting granulocyte colony-stimulating factor that stimulates the growth of neutrophils, a type of white blood cell important in the body's fight against infection. It is used to reduce the incidence of fever and infection in patients with certain types of cancer who are receiving chemotherapy that affects the bone marrow, and to increase survival after being exposed to high doses of radiation. This medicine may be used for other purposes; ask your health care provider or pharmacist if you have questions. COMMON BRAND NAME(S): Neulasta What should I tell my health care provider before I take this medicine? They need to know if you have any of these conditions: -kidney disease -latex allergy -ongoing radiation therapy -sickle cell disease -skin reactions to acrylic adhesives (On-Body Injector only) -an unusual or allergic reaction to pegfilgrastim, filgrastim, other medicines, foods, dyes, or preservatives -pregnant or trying to get pregnant -breast-feeding How should I use this medicine? This medicine is for injection under the skin. If you get this medicine at home, you will be taught how to prepare and give the pre-filled syringe or how to use the On-body Injector. Refer to the patient Instructions for Use for detailed instructions. Use exactly as directed. Tell your healthcare provider immediately if you suspect that the On-body Injector may not have performed as intended or if you suspect the use of the On-body Injector resulted in a missed or partial dose. It is important that you put your used needles and syringes in a special sharps container. Do not put them in a trash can. If you do not have a sharps container, call your pharmacist or healthcare provider to get one. Talk to your pediatrician regarding the use of this medicine in children. While this drug may be prescribed for selected  conditions, precautions do apply. Overdosage: If you think you have taken too much of this medicine contact a poison control center or emergency room at once. NOTE: This medicine is only for you. Do not share this medicine with others. What if I miss a dose? It is important not to miss your dose. Call your doctor or health care professional if you miss your dose. If you miss a dose due to an On-body Injector failure or leakage, a new dose should be administered as soon as possible using a single prefilled syringe for manual use. What may interact with this medicine? Interactions have not been studied. Give your health care provider a list of all the medicines, herbs, non-prescription drugs, or dietary supplements you use. Also tell them if you smoke, drink alcohol, or use illegal drugs. Some items may interact with your medicine. This list may not describe all possible interactions. Give your health care provider a list of all the medicines, herbs, non-prescription drugs, or dietary supplements you use. Also tell them if you smoke, drink alcohol, or use illegal drugs. Some items may interact with your medicine. What should I watch for while using this medicine? You may need blood work done while you are taking this medicine. If you are going to need a MRI, CT scan, or other procedure, tell your doctor that you are using this medicine (On-Body Injector only). What side effects may I notice from receiving this medicine? Side effects that you should report to your doctor or health care professional as soon as possible: -allergic reactions like skin rash, itching or hives, swelling of the face, lips, or tongue -dizziness -fever -pain, redness, or irritation  at site where injected -pinpoint red spots on the skin -red or dark-brown urine -shortness of breath or breathing problems -stomach or side pain, or pain at the shoulder -swelling -tiredness -trouble passing urine or change in the amount of  urine Side effects that usually do not require medical attention (report to your doctor or health care professional if they continue or are bothersome): -bone pain -muscle pain This list may not describe all possible side effects. Call your doctor for medical advice about side effects. You may report side effects to FDA at 1-800-FDA-1088. Where should I keep my medicine? Keep out of the reach of children. Store pre-filled syringes in a refrigerator between 2 and 8 degrees C (36 and 46 degrees F). Do not freeze. Keep in carton to protect from light. Throw away this medicine if it is left out of the refrigerator for more than 48 hours. Throw away any unused medicine after the expiration date. NOTE: This sheet is a summary. It may not cover all possible information. If you have questions about this medicine, talk to your doctor, pharmacist, or health care provider.  2018 [employer]vier/Gold Standard (2016-11-02 12:58:03)

## 2018-10-19 DIAGNOSIS — C3492 Malignant neoplasm of unspecified part of left bronchus or lung: Secondary | ICD-10-CM | POA: Diagnosis not present

## 2018-10-21 ENCOUNTER — Inpatient Hospital Stay: Payer: Medicare Other

## 2018-10-21 ENCOUNTER — Inpatient Hospital Stay: Payer: Medicare Other | Attending: Internal Medicine

## 2018-10-21 DIAGNOSIS — C3492 Malignant neoplasm of unspecified part of left bronchus or lung: Secondary | ICD-10-CM

## 2018-10-21 DIAGNOSIS — Z7689 Persons encountering health services in other specified circumstances: Secondary | ICD-10-CM | POA: Diagnosis not present

## 2018-10-21 DIAGNOSIS — C7951 Secondary malignant neoplasm of bone: Secondary | ICD-10-CM | POA: Insufficient documentation

## 2018-10-21 DIAGNOSIS — Z79899 Other long term (current) drug therapy: Secondary | ICD-10-CM | POA: Insufficient documentation

## 2018-10-21 DIAGNOSIS — Z5111 Encounter for antineoplastic chemotherapy: Secondary | ICD-10-CM | POA: Insufficient documentation

## 2018-10-21 DIAGNOSIS — C787 Secondary malignant neoplasm of liver and intrahepatic bile duct: Secondary | ICD-10-CM | POA: Diagnosis not present

## 2018-10-21 DIAGNOSIS — C3412 Malignant neoplasm of upper lobe, left bronchus or lung: Secondary | ICD-10-CM | POA: Diagnosis not present

## 2018-10-21 DIAGNOSIS — Z95828 Presence of other vascular implants and grafts: Secondary | ICD-10-CM

## 2018-10-21 DIAGNOSIS — C7800 Secondary malignant neoplasm of unspecified lung: Secondary | ICD-10-CM | POA: Insufficient documentation

## 2018-10-21 LAB — CMP (CANCER CENTER ONLY)
ALT: 23 U/L (ref 0–44)
AST: 37 U/L (ref 15–41)
Albumin: 3.7 g/dL (ref 3.5–5.0)
Alkaline Phosphatase: 256 U/L — ABNORMAL HIGH (ref 38–126)
Anion gap: 10 (ref 5–15)
BUN: 13 mg/dL (ref 6–20)
CHLORIDE: 103 mmol/L (ref 98–111)
CO2: 28 mmol/L (ref 22–32)
Calcium: 9.4 mg/dL (ref 8.9–10.3)
Creatinine: 0.65 mg/dL (ref 0.44–1.00)
GFR, Est AFR Am: >60 mL/min (ref >60)
GFR, Estimated: >60 mL/min (ref >60)
Glucose, Bld: 104 mg/dL — ABNORMAL HIGH (ref 70–99)
POTASSIUM: 4 mmol/L (ref 3.5–5.1)
SODIUM: 141 mmol/L (ref 135–145)
Total Bilirubin: 0.3 mg/dL (ref 0.3–1.2)
Total Protein: 7.8 g/dL (ref 6.5–8.1)

## 2018-10-21 LAB — CBC WITH DIFFERENTIAL (CANCER CENTER ONLY)
Abs Immature Granulocytes: 1.71 10*3/uL — ABNORMAL HIGH (ref 0.00–0.07)
BASOS ABS: 0.1 10*3/uL (ref 0.0–0.1)
Basophils Relative: 1 %
Eosinophils Absolute: 0.1 10*3/uL (ref 0.0–0.5)
Eosinophils Relative: 1 %
HCT: 32.8 % — ABNORMAL LOW (ref 36.0–46.0)
Hemoglobin: 10.4 g/dL — ABNORMAL LOW (ref 12.0–15.0)
Immature Granulocytes: 13 %
Lymphocytes Relative: 17 %
Lymphs Abs: 2.2 10*3/uL (ref 0.7–4.0)
MCH: 33.1 pg (ref 26.0–34.0)
MCHC: 31.7 g/dL (ref 30.0–36.0)
MCV: 104.5 fL — ABNORMAL HIGH (ref 80.0–100.0)
Monocytes Absolute: 0.5 10*3/uL (ref 0.1–1.0)
Monocytes Relative: 4 %
Neutro Abs: 8.6 10*3/uL — ABNORMAL HIGH (ref 1.7–7.7)
Neutrophils Relative %: 64 %
Platelet Count: 158 10*3/uL (ref 150–400)
RBC: 3.14 MIL/uL — AB (ref 3.87–5.11)
RDW: 16.3 % — ABNORMAL HIGH (ref 11.5–15.5)
WBC Count: 13.2 10*3/uL — ABNORMAL HIGH (ref 4.0–10.5)
nRBC: 0 % (ref 0.0–0.2)

## 2018-10-21 MED ORDER — HEPARIN SOD (PORK) LOCK FLUSH 100 UNIT/ML IV SOLN
500.0000 [IU] | Freq: Once | INTRAVENOUS | Status: AC | PRN
  Administered 2018-10-21: 500 [IU]
  Filled 2018-10-21: qty 5

## 2018-10-21 MED ORDER — SODIUM CHLORIDE 0.9% FLUSH
10.0000 mL | INTRAVENOUS | Status: DC | PRN
  Administered 2018-10-21: 10 mL
  Filled 2018-10-21: qty 10

## 2018-10-28 ENCOUNTER — Inpatient Hospital Stay: Payer: Medicare Other

## 2018-10-28 DIAGNOSIS — Z7689 Persons encountering health services in other specified circumstances: Secondary | ICD-10-CM | POA: Diagnosis not present

## 2018-10-28 DIAGNOSIS — C7951 Secondary malignant neoplasm of bone: Secondary | ICD-10-CM | POA: Diagnosis not present

## 2018-10-28 DIAGNOSIS — C3492 Malignant neoplasm of unspecified part of left bronchus or lung: Secondary | ICD-10-CM

## 2018-10-28 DIAGNOSIS — C3412 Malignant neoplasm of upper lobe, left bronchus or lung: Secondary | ICD-10-CM | POA: Diagnosis not present

## 2018-10-28 DIAGNOSIS — Z5111 Encounter for antineoplastic chemotherapy: Secondary | ICD-10-CM | POA: Diagnosis not present

## 2018-10-28 DIAGNOSIS — C7800 Secondary malignant neoplasm of unspecified lung: Secondary | ICD-10-CM | POA: Diagnosis not present

## 2018-10-28 DIAGNOSIS — Z79899 Other long term (current) drug therapy: Secondary | ICD-10-CM | POA: Diagnosis not present

## 2018-10-28 DIAGNOSIS — Z95828 Presence of other vascular implants and grafts: Secondary | ICD-10-CM

## 2018-10-28 DIAGNOSIS — C787 Secondary malignant neoplasm of liver and intrahepatic bile duct: Secondary | ICD-10-CM | POA: Diagnosis not present

## 2018-10-28 LAB — CMP (CANCER CENTER ONLY)
ALT: 24 U/L (ref 0–44)
AST: 35 U/L (ref 15–41)
Albumin: 3.6 g/dL (ref 3.5–5.0)
Alkaline Phosphatase: 245 U/L — ABNORMAL HIGH (ref 38–126)
Anion gap: 9 (ref 5–15)
BUN: 7 mg/dL (ref 6–20)
CO2: 28 mmol/L (ref 22–32)
CREATININE: 0.66 mg/dL (ref 0.44–1.00)
Calcium: 9.3 mg/dL (ref 8.9–10.3)
Chloride: 104 mmol/L (ref 98–111)
GFR, Est AFR Am: >60 mL/min (ref >60)
Glucose, Bld: 103 mg/dL — ABNORMAL HIGH (ref 70–99)
Potassium: 3.9 mmol/L (ref 3.5–5.1)
Sodium: 141 mmol/L (ref 135–145)
Total Bilirubin: 0.3 mg/dL (ref 0.3–1.2)
Total Protein: 7.8 g/dL (ref 6.5–8.1)

## 2018-10-28 LAB — CBC WITH DIFFERENTIAL (CANCER CENTER ONLY)
Abs Immature Granulocytes: 0.03 10*3/uL (ref 0.00–0.07)
Basophils Absolute: 0.1 10*3/uL (ref 0.0–0.1)
Basophils Relative: 1 %
Eosinophils Absolute: 0.1 10*3/uL (ref 0.0–0.5)
Eosinophils Relative: 1 %
HEMATOCRIT: 34.8 % — AB (ref 36.0–46.0)
Hemoglobin: 11 g/dL — ABNORMAL LOW (ref 12.0–15.0)
Immature Granulocytes: 0 %
Lymphocytes Relative: 35 %
Lymphs Abs: 2.6 10*3/uL (ref 0.7–4.0)
MCH: 32.5 pg (ref 26.0–34.0)
MCHC: 31.6 g/dL (ref 30.0–36.0)
MCV: 103 fL — ABNORMAL HIGH (ref 80.0–100.0)
MONO ABS: 0.9 10*3/uL (ref 0.1–1.0)
Monocytes Relative: 12 %
Neutro Abs: 3.7 10*3/uL (ref 1.7–7.7)
Neutrophils Relative %: 51 %
Platelet Count: 90 10*3/uL — ABNORMAL LOW (ref 150–400)
RBC: 3.38 MIL/uL — ABNORMAL LOW (ref 3.87–5.11)
RDW: 16 % — AB (ref 11.5–15.5)
WBC: 7.4 10*3/uL (ref 4.0–10.5)
nRBC: 0 % (ref 0.0–0.2)

## 2018-10-28 MED ORDER — HEPARIN SOD (PORK) LOCK FLUSH 100 UNIT/ML IV SOLN
500.0000 [IU] | Freq: Once | INTRAVENOUS | Status: AC | PRN
  Administered 2018-10-28: 500 [IU]
  Filled 2018-10-28: qty 5

## 2018-10-28 MED ORDER — SODIUM CHLORIDE 0.9% FLUSH
10.0000 mL | INTRAVENOUS | Status: DC | PRN
  Administered 2018-10-28: 10 mL
  Filled 2018-10-28: qty 10

## 2018-11-04 ENCOUNTER — Inpatient Hospital Stay (HOSPITAL_BASED_OUTPATIENT_CLINIC_OR_DEPARTMENT_OTHER): Payer: Medicare Other | Admitting: Internal Medicine

## 2018-11-04 ENCOUNTER — Telehealth: Payer: Self-pay | Admitting: Internal Medicine

## 2018-11-04 ENCOUNTER — Encounter: Payer: Self-pay | Admitting: Internal Medicine

## 2018-11-04 ENCOUNTER — Inpatient Hospital Stay: Payer: Medicare Other

## 2018-11-04 ENCOUNTER — Other Ambulatory Visit: Payer: Self-pay

## 2018-11-04 VITALS — BP 143/73 | HR 94 | Temp 98.0°F | Resp 18 | Ht 64.0 in | Wt 186.4 lb

## 2018-11-04 DIAGNOSIS — Z79899 Other long term (current) drug therapy: Secondary | ICD-10-CM

## 2018-11-04 DIAGNOSIS — C787 Secondary malignant neoplasm of liver and intrahepatic bile duct: Secondary | ICD-10-CM

## 2018-11-04 DIAGNOSIS — C3412 Malignant neoplasm of upper lobe, left bronchus or lung: Secondary | ICD-10-CM

## 2018-11-04 DIAGNOSIS — R5382 Chronic fatigue, unspecified: Secondary | ICD-10-CM

## 2018-11-04 DIAGNOSIS — Z5111 Encounter for antineoplastic chemotherapy: Secondary | ICD-10-CM

## 2018-11-04 DIAGNOSIS — C3492 Malignant neoplasm of unspecified part of left bronchus or lung: Secondary | ICD-10-CM

## 2018-11-04 DIAGNOSIS — I10 Essential (primary) hypertension: Secondary | ICD-10-CM

## 2018-11-04 DIAGNOSIS — C7800 Secondary malignant neoplasm of unspecified lung: Secondary | ICD-10-CM

## 2018-11-04 DIAGNOSIS — Z95828 Presence of other vascular implants and grafts: Secondary | ICD-10-CM

## 2018-11-04 DIAGNOSIS — C7951 Secondary malignant neoplasm of bone: Secondary | ICD-10-CM

## 2018-11-04 DIAGNOSIS — Z5112 Encounter for antineoplastic immunotherapy: Secondary | ICD-10-CM

## 2018-11-04 DIAGNOSIS — Z7689 Persons encountering health services in other specified circumstances: Secondary | ICD-10-CM | POA: Diagnosis not present

## 2018-11-04 DIAGNOSIS — C3490 Malignant neoplasm of unspecified part of unspecified bronchus or lung: Secondary | ICD-10-CM

## 2018-11-04 LAB — CMP (CANCER CENTER ONLY)
ALT: 17 U/L (ref 0–44)
AST: 30 U/L (ref 15–41)
Albumin: 3.5 g/dL (ref 3.5–5.0)
Alkaline Phosphatase: 214 U/L — ABNORMAL HIGH (ref 38–126)
Anion gap: 11 (ref 5–15)
BUN: 12 mg/dL (ref 6–20)
CHLORIDE: 103 mmol/L (ref 98–111)
CO2: 28 mmol/L (ref 22–32)
Calcium: 9.6 mg/dL (ref 8.9–10.3)
Creatinine: 0.67 mg/dL (ref 0.44–1.00)
GFR, Est AFR Am: >60 mL/min (ref >60)
GFR, Estimated: >60 mL/min (ref >60)
Glucose, Bld: 105 mg/dL — ABNORMAL HIGH (ref 70–99)
Potassium: 3.9 mmol/L (ref 3.5–5.1)
Sodium: 142 mmol/L (ref 135–145)
Total Bilirubin: 0.2 mg/dL — ABNORMAL LOW (ref 0.3–1.2)
Total Protein: 7.7 g/dL (ref 6.5–8.1)

## 2018-11-04 LAB — CBC WITH DIFFERENTIAL (CANCER CENTER ONLY)
Abs Immature Granulocytes: 0.01 10*3/uL (ref 0.00–0.07)
BASOS ABS: 0 10*3/uL (ref 0.0–0.1)
Basophils Relative: 1 %
Eosinophils Absolute: 0.1 10*3/uL (ref 0.0–0.5)
Eosinophils Relative: 1 %
HCT: 34.3 % — ABNORMAL LOW (ref 36.0–46.0)
Hemoglobin: 10.9 g/dL — ABNORMAL LOW (ref 12.0–15.0)
Immature Granulocytes: 0 %
Lymphocytes Relative: 36 %
Lymphs Abs: 2.3 10*3/uL (ref 0.7–4.0)
MCH: 32.6 pg (ref 26.0–34.0)
MCHC: 31.8 g/dL (ref 30.0–36.0)
MCV: 102.7 fL — ABNORMAL HIGH (ref 80.0–100.0)
Monocytes Absolute: 0.9 10*3/uL (ref 0.1–1.0)
Monocytes Relative: 15 %
Neutro Abs: 3 10*3/uL (ref 1.7–7.7)
Neutrophils Relative %: 47 %
Platelet Count: 207 10*3/uL (ref 150–400)
RBC: 3.34 MIL/uL — ABNORMAL LOW (ref 3.87–5.11)
RDW: 15.4 % (ref 11.5–15.5)
WBC Count: 6.3 10*3/uL (ref 4.0–10.5)
nRBC: 0 % (ref 0.0–0.2)

## 2018-11-04 LAB — TSH: TSH: 2.686 u[IU]/mL (ref 0.308–3.960)

## 2018-11-04 MED ORDER — SODIUM CHLORIDE 0.9 % IV SOLN
Freq: Once | INTRAVENOUS | Status: AC
  Administered 2018-11-04: 09:00:00 via INTRAVENOUS
  Filled 2018-11-04: qty 250

## 2018-11-04 MED ORDER — SODIUM CHLORIDE 0.9% FLUSH
10.0000 mL | INTRAVENOUS | Status: DC | PRN
  Filled 2018-11-04: qty 10

## 2018-11-04 MED ORDER — SODIUM CHLORIDE 0.9 % IV SOLN
100.0000 mg/m2 | Freq: Once | INTRAVENOUS | Status: AC
  Administered 2018-11-04: 190 mg via INTRAVENOUS
  Filled 2018-11-04: qty 9.5

## 2018-11-04 MED ORDER — SODIUM CHLORIDE 0.9 % IV SOLN
571.5000 mg | Freq: Once | INTRAVENOUS | Status: AC
  Administered 2018-11-04: 570 mg via INTRAVENOUS
  Filled 2018-11-04: qty 57

## 2018-11-04 MED ORDER — SODIUM CHLORIDE 0.9 % IV SOLN
1200.0000 mg | Freq: Once | INTRAVENOUS | Status: AC
  Administered 2018-11-04: 1200 mg via INTRAVENOUS
  Filled 2018-11-04: qty 20

## 2018-11-04 MED ORDER — SODIUM CHLORIDE 0.9 % IV SOLN
Freq: Once | INTRAVENOUS | Status: AC
  Administered 2018-11-04: 10:00:00 via INTRAVENOUS
  Filled 2018-11-04: qty 5

## 2018-11-04 MED ORDER — HEPARIN SOD (PORK) LOCK FLUSH 100 UNIT/ML IV SOLN
500.0000 [IU] | Freq: Once | INTRAVENOUS | Status: DC | PRN
  Filled 2018-11-04: qty 5

## 2018-11-04 MED ORDER — SODIUM CHLORIDE 0.9% FLUSH
10.0000 mL | INTRAVENOUS | Status: DC | PRN
  Administered 2018-11-04: 10 mL
  Filled 2018-11-04: qty 10

## 2018-11-04 MED ORDER — PALONOSETRON HCL INJECTION 0.25 MG/5ML
0.2500 mg | Freq: Once | INTRAVENOUS | Status: AC
  Administered 2018-11-04: 0.25 mg via INTRAVENOUS

## 2018-11-04 MED ORDER — PALONOSETRON HCL INJECTION 0.25 MG/5ML
INTRAVENOUS | Status: AC
  Filled 2018-11-04: qty 5

## 2018-11-04 NOTE — Patient Instructions (Signed)

## 2018-11-04 NOTE — Progress Notes (Signed)
[facility] [phone]   [phone]  OFFICE PROGRESS NOTE  [name], MD [place] [address]  DIAGNOSIS: Extensive stage (T3, N2, M1a)  small cell lung cancer diagnosed in August 2019 and presented with large left hilar/subhilar mass in addition to mediastinal lymphadenopathy and contracture and mass in the right upper lobe.  PRIOR THERAPY:None  CURRENT THERAPY: Therapy with carboplatin for AUC of 5 on day 1, etoposide 100 mg/M2 on days 1, 2 and 3 as well as Tecentriq (Atezolizumab) 1200 mg IV every 3 weeks with Neulasta support.  Status post 5 cycles.  INTERVAL HISTORY: [name] 62 y.o. female returns to the clinic today for follow-up visit accompanied by her husband.  The patient is feeling fine today with no concerning complaints.  She continues to tolerate her systemic chemotherapy fairly well with no significant adverse effects.  The patient denied having any chest pain, shortness of breath, cough or hemoptysis.  She denied having any fever or chills.  She has no nausea, vomiting, diarrhea or constipation.  She is here today for evaluation before starting cycle #6 of her treatment.  MEDICAL HISTORY: Past Medical History:  Diagnosis Date  . Alpha-1-antitrypsin deficiency ([place])   . Cervical cancer ([place]) 2004  . 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 This doesn't fit entirely with emphysema.      . Exposure to hepatitis C 09/12/2016  . Fibromyalgia   . GERD (gastroesophageal reflux disease)   . Hyperlipidemia   . IBS (irritable bowel syndrome)   . Obstructive sleep apnea 06/02/2008   NPSG 06/07/08, AHI 6.9/ hr, weight 220 lbs    . Osteoporosis   . Pulmonary fibrosis, unspecified ([place]) 09/29/2016   CXR 03/28/2016  . Rhinitis, nonallergic  05/08/2012  . 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 Vitamin D deficiency     ALLERGIES:  is allergic to penicillins; actonel [risedronate sodium]; advair diskus [fluticasone-salmeterol]; alendronate sodium; aspirin; azithromycin; keflex [cephalexin]; levofloxacin; morphine; nsaids; omnicef [cefdinir]; erythromycin; and iodine.  MEDICATIONS:  Current Outpatient Medications  Medication Sig Dispense Refill  . albuterol (PROVENTIL) (2.5 MG/3ML) 0.083% nebulizer solution Take 3 mLs (2.5 mg total) by nebulization every 6 (six) hours as needed. (Patient not taking: Reported on 10/14/2018) 90 mL 4  . allopurinol (ZYLOPRIM) 300 MG tablet Take 1 tablet (300 mg total) by mouth daily. Begin on 07/25/2018 14 tablet 0  . amitriptyline (ELAVIL) 75 MG tablet Take 75 mg by mouth at bedtime.      . benzonatate (TESSALON) 100 MG capsule Take 1 capsule (100 mg total) by mouth 3 (three) times daily as needed for cough. (Patient not taking: Reported on 10/14/2018) 20 capsule 0  . ezetimibe (ZETIA) 10 MG tablet TAKE 1 TABLET ONCE A DAY 90 tablet 1  . feeding supplement, ENSURE ENLIVE, (ENSURE ENLIVE) LIQD Take 237 mLs by mouth 2 (two) times daily between meals. 60 Bottle 0  . fluticasone (FLONASE) 50 MCG/ACT nasal spray USE 2 SPRAYS IN EACH NOSTRIL ONCE DAILY. 16 g 5  . folic acid (FOLVITE) 1 MG tablet Take 1 mg by mouth daily.    . Glycopyrrolate-Formoterol (BEVESPI AEROSPHERE) 9-4.8 MCG/ACT AERO Inhale 2 puffs into the lungs 2 (two) times daily. 1 Inhaler 12  . HYDROcodone-acetaminophen (NORCO/VICODIN) 5-325  MG tablet Take 1 tablet by mouth 3 (three) times daily. 30 tablet 0  . lidocaine-prilocaine (EMLA) cream Apply 1 application topically as needed. 30 g 0  . Nebulizers (COMPRESSOR NEBULIZER) MISC 1 Device by Does not apply route as needed. 1 each prn  . omeprazole (PRILOSEC) 40 MG capsule TAKE (1) CAPSULE DAILY (Patient taking differently: Take 40 mg by mouth daily. ) 90  capsule 1  . PROAIR HFA 108 (90 Base) MCG/ACT inhaler 2 PUFFS EVERY 4 HOURS AS NEEDED FOR WHEEZING 8.5 g 0  . prochlorperazine (COMPAZINE) 10 MG tablet Take 1 tablet (10 mg total) by mouth every 6 (six) hours as needed for nausea or vomiting. (Patient not taking: Reported on 10/14/2018) 30 tablet 0  . rosuvastatin (CRESTOR) 40 MG tablet TAKE 1/2 TABLET ONCE DAILY (Patient taking differently: Take 20 mg by mouth daily. ) 45 tablet 1   No current facility-administered medications for this visit.    Facility-Administered Medications Ordered in Other Visits  Medication Dose Route Frequency Provider Last Rate Last Dose  . sodium chloride flush (NS) 0.9 % injection 10 mL  10 mL Intracatheter PRN Curt Bears, MD   10 mL at 11/04/18 0751    SURGICAL HISTORY:  Past Surgical History:  Procedure Laterality Date  . ABDOMINAL HYSTERECTOMY    . IR IMAGING GUIDED PORT INSERTION  08/02/2018  . LUMBAR DISC SURGERY    . VIDEO BRONCHOSCOPY Bilateral 07/10/2018   Procedure: VIDEO BRONCHOSCOPY WITHOUT FLUORO;  Surgeon: Laurin Coder, MD;  Location: [facility];  Service: Cardiopulmonary;  Laterality: Bilateral;    REVIEW OF SYSTEMS:  A comprehensive review of systems was negative except for: Constitutional: positive for fatigue   PHYSICAL EXAMINATION: General appearance: alert, cooperative, fatigued and no distress Head: Normocephalic, without obvious abnormality, atraumatic Neck: no adenopathy, no JVD, supple, symmetrical, trachea midline and thyroid not enlarged, symmetric, no tenderness/mass/nodules Lymph nodes: Cervical, supraclavicular, and axillary nodes normal. Resp: clear to auscultation bilaterally Back: symmetric, no curvature. ROM normal. No CVA tenderness. Cardio: regular rate and rhythm, S1, S2 normal, no murmur, click, rub or gallop GI: soft, non-tender; bowel sounds normal; no masses,  no organomegaly Extremities: extremities normal, atraumatic, no cyanosis or edema  ECOG  PERFORMANCE STATUS: 1 - Symptomatic but completely ambulatory  Blood pressure (!) 143/73, pulse 94, temperature 98 F (36.7 C), temperature source Oral, resp. rate 18, height 5\' 4"  (1.626 m), weight 186 lb 6.4 oz (84.6 kg), SpO2 93 %.  LABORATORY DATA: Lab Results  Component Value Date   WBC 6.3 11/04/2018   HGB 10.9 (L) 11/04/2018   HCT 34.3 (L) 11/04/2018   MCV 102.7 (H) 11/04/2018   PLT 207 11/04/2018      Chemistry      Component Value Date/Time   NA 141 10/28/2018 0751   NA 124 (L) 07/16/2018 0853   K 3.9 10/28/2018 0751   CL 104 10/28/2018 0751   CO2 28 10/28/2018 0751   BUN 7 10/28/2018 0751   BUN 8 07/16/2018 0853   CREATININE 0.66 10/28/2018 0751   CREATININE 0.68 02/13/2013 1523      Component Value Date/Time   CALCIUM 9.3 10/28/2018 0751   ALKPHOS 245 (H) 10/28/2018 0751   AST 35 10/28/2018 0751   ALT 24 10/28/2018 0751   BILITOT 0.3 10/28/2018 0751       RADIOGRAPHIC STUDIES: Ct Chest W Contrast  Result Date: 10/11/2018 CLINICAL DATA:  Metastatic small cell lung cancer restaging EXAM: CT CHEST, ABDOMEN, AND PELVIS WITH CONTRAST  TECHNIQUE: Multidetector CT imaging of the chest, abdomen and pelvis was performed following the standard protocol during bolus administration of intravenous contrast. CONTRAST:  143mL OMNIPAQUE IOHEXOL 300 MG/ML  SOLN COMPARISON:  Multiple exams, including 08/26/2018 FINDINGS: CT CHEST FINDINGS Cardiovascular: Right Port-A-Cath tip: Right atrium. Coronary, aortic arch, and branch vessel atherosclerotic vascular disease. Mediastinum/Nodes: Prevascular node 0.7 cm in short axis on image 14/2, stable. Left lower paratracheal node 1.1 cm in short axis on image 18/2, formerly 1.3 cm. Left infrahilar node 0.8 cm in short axis on image 24/2, formerly 1.0 cm. Further reduction in the previous left infrahilar mass which currently is appreciable only as mild peribronchovascular nodularity resembling lymph nodes. Lungs/Pleura: Paraseptal emphysema.  Right basilar scarring. Airway thickening is present, suggesting bronchitis or reactive airways disease. Calcified granuloma in the superior segment right lower lobe. Reduced atelectasis in the left lower lobe although there is some residual atelectasis or scarring. The previously clustered nodules in the right lower lobe have resolved. Musculoskeletal: Left lateral ninth rib fracture with some healing response, without current union. Indistinct sclerotic lesions at T3, T5, T8, and T10, not appreciably changed from prior CT ABDOMEN PELVIS FINDINGS Hepatobiliary: Hepatic cirrhosis. Scattered hypodense lesions are observed in the liver with distribution similar to the prior exam; these were hypermetabolic on a previous PET-CT compatible with metastatic lesions. For the most part these lesions appear stable compared to the prior exam, with a left hepatic lobe lesion measuring 9 mm transverse on image 55/2, and the same previously. Gallbladder unremarkable.  No biliary dilatation. Pancreas: Unremarkable Spleen: Unremarkable Adrenals/Urinary Tract: The patchy hypodensities in the right kidney are essentially resolved. There may be a subtle residuum of the lower pole hypodensity measuring about 9 mm in diameter on image 90/2 but this is uncertain, and previously the hypodensity in this vicinity measured 1.8 by 2.3 cm and was well-defined. Stomach/Bowel: Unremarkable Vascular/Lymphatic: Gastrohepatic ligament node 1.0 cm in short axis, previously 1.1 cm, probably reactive. Similar small reactive lymph nodes in the porta hepatis. Aortoiliac atherosclerotic vascular disease. Prior lymph node dissections in the pelvis. Right external iliac node 1.0 cm in short axis on image 107/2, formerly the same. Reproductive: Uterus absent.  Adnexa unremarkable. Other: No supplemental non-categorized findings. Musculoskeletal: Ill-defined sclerotic lesion in the right iliac bone adjacent to the SI joint, stable in size at 2.5 by 1.7 cm.  Similar stable faintly sclerotic lesions in the lumbar spine and left iliac bone. 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 Threaded interbody fusion cages are in place at L4-5, with resected inferior facets at L4 and no subluxation. IMPRESSION: 1. Reduced thoracic adenopathy and further reduction in the left infrahilar mass, which today manifest primarily as peribronchovascular nodularity resembling clustered nodal disease. 2. Stable scattered sclerotic bony lesions in the chest, abdomen, and pelvis compatible with previous metastatic disease. No progression or change of the bony involvement. 3. Hepatic cirrhosis with multiple hypodense hepatic lesions which are likewise unchanged in size and appearance. 4. Resolution of the nodularity in the right lower lobe. 5. Resolution of prior hypodense lesions in the right kidney, with only equivocal minimal hypodensity appreciable along the right kidney lower pole, significantly reduced in conspicuity and size. 6. Other imaging findings of potential clinical significance: Aortic Atherosclerosis (ICD10-I70.0). Coronary atherosclerosis. Emphysema (ICD10-J43.9). Airway thickening is present, suggesting bronchitis or reactive airways disease. Later phase healing of a left lateral ninth rib fracture. Upper abdominal borderline enlarged lymph nodes are likely reactive  to the cirrhosis. Electronically Signed   By: Van Clines M.D.   On: 10/11/2018 17:10   Ct Abdomen Pelvis W Contrast  Result Date: 10/11/2018 CLINICAL DATA:  Metastatic small cell lung cancer restaging EXAM: CT CHEST, ABDOMEN, AND PELVIS WITH CONTRAST TECHNIQUE: Multidetector CT imaging of the chest, abdomen and pelvis was performed following the standard protocol during bolus administration of intravenous contrast. CONTRAST:  160mL OMNIPAQUE IOHEXOL 300 MG/ML  SOLN COMPARISON:  Multiple exams, including 08/26/2018 FINDINGS: CT CHEST FINDINGS  Cardiovascular: Right Port-A-Cath tip: Right atrium. Coronary, aortic arch, and branch vessel atherosclerotic vascular disease. Mediastinum/Nodes: Prevascular node 0.7 cm in short axis on image 14/2, stable. Left lower paratracheal node 1.1 cm in short axis on image 18/2, formerly 1.3 cm. Left infrahilar node 0.8 cm in short axis on image 24/2, formerly 1.0 cm. Further reduction in the previous left infrahilar mass which currently is appreciable only as mild peribronchovascular nodularity resembling lymph nodes. Lungs/Pleura: Paraseptal emphysema. Right basilar scarring. Airway thickening is present, suggesting bronchitis or reactive airways disease. Calcified granuloma in the superior segment right lower lobe. Reduced atelectasis in the left lower lobe although there is some residual atelectasis or scarring. The previously clustered nodules in the right lower lobe have resolved. Musculoskeletal: Left lateral ninth rib fracture with some healing response, without current union. Indistinct sclerotic lesions at T3, T5, T8, and T10, not appreciably changed from prior CT ABDOMEN PELVIS FINDINGS Hepatobiliary: Hepatic cirrhosis. Scattered hypodense lesions are observed in the liver with distribution similar to the prior exam; these were hypermetabolic on a previous PET-CT compatible with metastatic lesions. For the most part these lesions appear stable compared to the prior exam, with a left hepatic lobe lesion measuring 9 mm transverse on image 55/2, and the same previously. Gallbladder unremarkable.  No biliary dilatation. Pancreas: Unremarkable Spleen: Unremarkable Adrenals/Urinary Tract: The patchy hypodensities in the right kidney are essentially resolved. There may be a subtle residuum of the lower pole hypodensity measuring about 9 mm in diameter on image 16/1 but this is uncertain, and previously the hypodensity in this vicinity measured 1.8 by 2.3 cm and was well-defined. Stomach/Bowel: Unremarkable  Vascular/Lymphatic: Gastrohepatic ligament node 1.0 cm in short axis, previously 1.1 cm, probably reactive. Similar small reactive lymph nodes in the porta hepatis. Aortoiliac atherosclerotic vascular disease. Prior lymph node dissections in the pelvis. Right external iliac node 1.0 cm in short axis on image 107/2, formerly the same. Reproductive: Uterus absent.  Adnexa unremarkable. Other: No supplemental non-categorized findings. Musculoskeletal: Ill-defined sclerotic lesion in the right iliac bone adjacent to the SI joint, stable in size at 2.5 by 1.7 cm. Similar stable faintly sclerotic lesions in the lumbar spine and left iliac bone. 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 Threaded interbody fusion cages are in place at L4-5, with resected inferior facets at L4 and no subluxation. IMPRESSION: 1. Reduced thoracic adenopathy and further reduction in the left infrahilar mass, which today manifest primarily as peribronchovascular nodularity resembling clustered nodal disease. 2. Stable scattered sclerotic bony lesions in the chest, abdomen, and pelvis compatible with previous metastatic disease. No progression or change of the bony involvement. 3. Hepatic cirrhosis with multiple hypodense hepatic lesions which are likewise unchanged in size and appearance. 4. Resolution of the nodularity in the right lower lobe. 5. Resolution of prior hypodense lesions in the right kidney, with only equivocal minimal hypodensity appreciable along the right kidney lower pole, significantly reduced in conspicuity and  size. 6. Other imaging findings of potential clinical significance: Aortic Atherosclerosis (ICD10-I70.0). Coronary atherosclerosis. Emphysema (ICD10-J43.9). Airway thickening is present, suggesting bronchitis or reactive airways disease. Later phase healing of a left lateral ninth rib fracture. Upper abdominal borderline enlarged lymph nodes are likely  reactive to the cirrhosis. Electronically Signed   By: Van Clines M.D.   On: 10/11/2018 17:10    ASSESSMENT AND PLAN: This is a very pleasant 62 years old white female recently diagnosed with extensive stage small cell lung cancer.  The patient had a recent PET scan that showed multiple metastatic disease in the lung, liver as well as bone. She was supposed to have MRI of the brain performed last week but this was not scheduled for unclear reason.  I will arrange for the patient to have MRI of the brain performed in the next few days. The patient is currently on systemic chemotherapy with carboplatin, etoposide and Tecentriq status post 5 cycles. The patient has been tolerating this treatment well with no concerning adverse effects. I recommended for her to proceed with cycle #6 today as scheduled. I will see the patient back for follow-up visit in 3 weeks for evaluation after repeating CT scan of the chest, abdomen and pelvis for restaging of her disease. The patient was advised to call immediately if she has any concerning symptoms in the interval. The patient voices understanding of current disease status and treatment options and is in agreement with the current care plan.  All questions were answered. The patient knows to call the clinic with any problems, questions or concerns. We can certainly see the patient much sooner if necessary.  Disclaimer: This note was dictated with voice recognition software. Similar sounding words can inadvertently be transcribed and may not be corrected upon review.

## 2018-11-04 NOTE — Telephone Encounter (Signed)
Scheduled appt per 12/16 los - pt to get an updated schedule next visit.

## 2018-11-04 NOTE — Patient Instructions (Signed)
[facility] Discharge Instructions for Patients Receiving Chemotherapy  Today you received the following chemotherapy agents Tecentriq, Carboplatin, Etoposide  To help prevent nausea and vomiting after your treatment, we encourage you to take your nausea medication as prescribed If you develop nausea and vomiting that is not controlled by your nausea medication, call the clinic.   BELOW ARE SYMPTOMS THAT SHOULD BE REPORTED IMMEDIATELY:  *FEVER GREATER THAN 100.5 F  *CHILLS WITH OR WITHOUT FEVER  NAUSEA AND VOMITING THAT IS NOT CONTROLLED WITH YOUR NAUSEA MEDICATION  *UNUSUAL SHORTNESS OF BREATH  *UNUSUAL BRUISING OR BLEEDING  TENDERNESS IN MOUTH AND THROAT WITH OR WITHOUT PRESENCE OF ULCERS  *URINARY PROBLEMS  *BOWEL PROBLEMS  UNUSUAL RASH Items with * indicate a potential emergency and should be followed up as soon as possible.  Feel free to call the clinic should you have any questions or concerns. The clinic phone number is [phone].  Please show the [facility] at check-in to the Emergency Department and triage nurse.

## 2018-11-05 ENCOUNTER — Inpatient Hospital Stay: Payer: Medicare Other

## 2018-11-05 VITALS — BP 121/68 | HR 93 | Temp 97.9°F | Resp 18

## 2018-11-05 DIAGNOSIS — C7951 Secondary malignant neoplasm of bone: Secondary | ICD-10-CM | POA: Diagnosis not present

## 2018-11-05 DIAGNOSIS — C7800 Secondary malignant neoplasm of unspecified lung: Secondary | ICD-10-CM | POA: Diagnosis not present

## 2018-11-05 DIAGNOSIS — C3412 Malignant neoplasm of upper lobe, left bronchus or lung: Secondary | ICD-10-CM | POA: Diagnosis not present

## 2018-11-05 DIAGNOSIS — Z79899 Other long term (current) drug therapy: Secondary | ICD-10-CM | POA: Diagnosis not present

## 2018-11-05 DIAGNOSIS — Z7689 Persons encountering health services in other specified circumstances: Secondary | ICD-10-CM | POA: Diagnosis not present

## 2018-11-05 DIAGNOSIS — C3492 Malignant neoplasm of unspecified part of left bronchus or lung: Secondary | ICD-10-CM

## 2018-11-05 DIAGNOSIS — C787 Secondary malignant neoplasm of liver and intrahepatic bile duct: Secondary | ICD-10-CM | POA: Diagnosis not present

## 2018-11-05 DIAGNOSIS — Z5111 Encounter for antineoplastic chemotherapy: Secondary | ICD-10-CM | POA: Diagnosis not present

## 2018-11-05 MED ORDER — DEXAMETHASONE SODIUM PHOSPHATE 10 MG/ML IJ SOLN
10.0000 mg | Freq: Once | INTRAMUSCULAR | Status: AC
  Administered 2018-11-05: 10 mg via INTRAVENOUS

## 2018-11-05 MED ORDER — DEXAMETHASONE SODIUM PHOSPHATE 10 MG/ML IJ SOLN
INTRAMUSCULAR | Status: AC
  Filled 2018-11-05: qty 1

## 2018-11-05 MED ORDER — HEPARIN SOD (PORK) LOCK FLUSH 100 UNIT/ML IV SOLN
500.0000 [IU] | Freq: Once | INTRAVENOUS | Status: AC | PRN
  Administered 2018-11-05: 500 [IU]
  Filled 2018-11-05: qty 5

## 2018-11-05 MED ORDER — SODIUM CHLORIDE 0.9% FLUSH
10.0000 mL | INTRAVENOUS | Status: DC | PRN
  Administered 2018-11-05: 10 mL
  Filled 2018-11-05: qty 10

## 2018-11-05 MED ORDER — SODIUM CHLORIDE 0.9 % IV SOLN
100.0000 mg/m2 | Freq: Once | INTRAVENOUS | Status: AC
  Administered 2018-11-05: 190 mg via INTRAVENOUS
  Filled 2018-11-05: qty 9.5

## 2018-11-05 MED ORDER — SODIUM CHLORIDE 0.9 % IV SOLN
Freq: Once | INTRAVENOUS | Status: AC
  Administered 2018-11-05: 08:00:00 via INTRAVENOUS
  Filled 2018-11-05: qty 250

## 2018-11-05 NOTE — Patient Instructions (Signed)
[facility] Discharge Instructions for Patients Receiving Chemotherapy  Today you received the following chemotherapy agents  Etoposide  To help prevent nausea and vomiting after your treatment, we encourage you to take your nausea medication as prescribed If you develop nausea and vomiting that is not controlled by your nausea medication, call the clinic.   BELOW ARE SYMPTOMS THAT SHOULD BE REPORTED IMMEDIATELY:  *FEVER GREATER THAN 100.5 F  *CHILLS WITH OR WITHOUT FEVER  NAUSEA AND VOMITING THAT IS NOT CONTROLLED WITH YOUR NAUSEA MEDICATION  *UNUSUAL SHORTNESS OF BREATH  *UNUSUAL BRUISING OR BLEEDING  TENDERNESS IN MOUTH AND THROAT WITH OR WITHOUT PRESENCE OF ULCERS  *URINARY PROBLEMS  *BOWEL PROBLEMS  UNUSUAL RASH Items with * indicate a potential emergency and should be followed up as soon as possible.  Feel free to call the clinic should you have any questions or concerns. The clinic phone number is [phone].  Please show the [facility] at check-in to the Emergency Department and triage nurse.

## 2018-11-06 ENCOUNTER — Inpatient Hospital Stay: Payer: Medicare Other

## 2018-11-06 VITALS — BP 130/74 | HR 89 | Temp 97.8°F | Resp 16

## 2018-11-06 DIAGNOSIS — C787 Secondary malignant neoplasm of liver and intrahepatic bile duct: Secondary | ICD-10-CM | POA: Diagnosis not present

## 2018-11-06 DIAGNOSIS — Z5111 Encounter for antineoplastic chemotherapy: Secondary | ICD-10-CM | POA: Diagnosis not present

## 2018-11-06 DIAGNOSIS — C7800 Secondary malignant neoplasm of unspecified lung: Secondary | ICD-10-CM | POA: Diagnosis not present

## 2018-11-06 DIAGNOSIS — C7951 Secondary malignant neoplasm of bone: Secondary | ICD-10-CM | POA: Diagnosis not present

## 2018-11-06 DIAGNOSIS — Z79899 Other long term (current) drug therapy: Secondary | ICD-10-CM | POA: Diagnosis not present

## 2018-11-06 DIAGNOSIS — C3412 Malignant neoplasm of upper lobe, left bronchus or lung: Secondary | ICD-10-CM | POA: Diagnosis not present

## 2018-11-06 DIAGNOSIS — C3492 Malignant neoplasm of unspecified part of left bronchus or lung: Secondary | ICD-10-CM

## 2018-11-06 DIAGNOSIS — Z7689 Persons encountering health services in other specified circumstances: Secondary | ICD-10-CM | POA: Diagnosis not present

## 2018-11-06 MED ORDER — SODIUM CHLORIDE 0.9% FLUSH
10.0000 mL | INTRAVENOUS | Status: DC | PRN
  Administered 2018-11-06: 10 mL
  Filled 2018-11-06: qty 10

## 2018-11-06 MED ORDER — DEXAMETHASONE SODIUM PHOSPHATE 10 MG/ML IJ SOLN
10.0000 mg | Freq: Once | INTRAMUSCULAR | Status: AC
  Administered 2018-11-06: 10 mg via INTRAVENOUS

## 2018-11-06 MED ORDER — HEPARIN SOD (PORK) LOCK FLUSH 100 UNIT/ML IV SOLN
500.0000 [IU] | Freq: Once | INTRAVENOUS | Status: AC | PRN
  Administered 2018-11-06: 500 [IU]
  Filled 2018-11-06: qty 5

## 2018-11-06 MED ORDER — SODIUM CHLORIDE 0.9 % IV SOLN
100.0000 mg/m2 | Freq: Once | INTRAVENOUS | Status: AC
  Administered 2018-11-06: 190 mg via INTRAVENOUS
  Filled 2018-11-06: qty 9.5

## 2018-11-06 MED ORDER — SODIUM CHLORIDE 0.9 % IV SOLN
Freq: Once | INTRAVENOUS | Status: AC
  Administered 2018-11-06: 08:00:00 via INTRAVENOUS
  Filled 2018-11-06: qty 250

## 2018-11-08 ENCOUNTER — Inpatient Hospital Stay: Payer: Medicare Other

## 2018-11-08 DIAGNOSIS — C7800 Secondary malignant neoplasm of unspecified lung: Secondary | ICD-10-CM | POA: Diagnosis not present

## 2018-11-08 DIAGNOSIS — C3412 Malignant neoplasm of upper lobe, left bronchus or lung: Secondary | ICD-10-CM | POA: Diagnosis not present

## 2018-11-08 DIAGNOSIS — C787 Secondary malignant neoplasm of liver and intrahepatic bile duct: Secondary | ICD-10-CM | POA: Diagnosis not present

## 2018-11-08 DIAGNOSIS — C3492 Malignant neoplasm of unspecified part of left bronchus or lung: Secondary | ICD-10-CM

## 2018-11-08 DIAGNOSIS — Z7689 Persons encountering health services in other specified circumstances: Secondary | ICD-10-CM | POA: Diagnosis not present

## 2018-11-08 DIAGNOSIS — Z79899 Other long term (current) drug therapy: Secondary | ICD-10-CM | POA: Diagnosis not present

## 2018-11-08 DIAGNOSIS — Z5111 Encounter for antineoplastic chemotherapy: Secondary | ICD-10-CM | POA: Diagnosis not present

## 2018-11-08 DIAGNOSIS — C7951 Secondary malignant neoplasm of bone: Secondary | ICD-10-CM | POA: Diagnosis not present

## 2018-11-08 MED ORDER — PEGFILGRASTIM-CBQV 6 MG/0.6ML ~~LOC~~ SOSY
6.0000 mg | PREFILLED_SYRINGE | Freq: Once | SUBCUTANEOUS | Status: AC
  Administered 2018-11-08: 6 mg via SUBCUTANEOUS

## 2018-11-11 ENCOUNTER — Other Ambulatory Visit: Payer: Self-pay | Admitting: Family Medicine

## 2018-11-11 ENCOUNTER — Inpatient Hospital Stay: Payer: Medicare Other

## 2018-11-11 DIAGNOSIS — Z5111 Encounter for antineoplastic chemotherapy: Secondary | ICD-10-CM | POA: Diagnosis not present

## 2018-11-11 DIAGNOSIS — Z95828 Presence of other vascular implants and grafts: Secondary | ICD-10-CM

## 2018-11-11 DIAGNOSIS — Z7689 Persons encountering health services in other specified circumstances: Secondary | ICD-10-CM | POA: Diagnosis not present

## 2018-11-11 DIAGNOSIS — Z79899 Other long term (current) drug therapy: Secondary | ICD-10-CM | POA: Diagnosis not present

## 2018-11-11 DIAGNOSIS — C787 Secondary malignant neoplasm of liver and intrahepatic bile duct: Secondary | ICD-10-CM | POA: Diagnosis not present

## 2018-11-11 DIAGNOSIS — C3492 Malignant neoplasm of unspecified part of left bronchus or lung: Secondary | ICD-10-CM

## 2018-11-11 DIAGNOSIS — C3412 Malignant neoplasm of upper lobe, left bronchus or lung: Secondary | ICD-10-CM | POA: Diagnosis not present

## 2018-11-11 DIAGNOSIS — C7800 Secondary malignant neoplasm of unspecified lung: Secondary | ICD-10-CM | POA: Diagnosis not present

## 2018-11-11 DIAGNOSIS — C7951 Secondary malignant neoplasm of bone: Secondary | ICD-10-CM | POA: Diagnosis not present

## 2018-11-11 LAB — CBC WITH DIFFERENTIAL (CANCER CENTER ONLY)
Abs Immature Granulocytes: 0.16 10*3/uL — ABNORMAL HIGH (ref 0.00–0.07)
Basophils Absolute: 0.1 10*3/uL (ref 0.0–0.1)
Basophils Relative: 1 %
Eosinophils Absolute: 0.1 10*3/uL (ref 0.0–0.5)
Eosinophils Relative: 1 %
HCT: 32.7 % — ABNORMAL LOW (ref 36.0–46.0)
Hemoglobin: 10.3 g/dL — ABNORMAL LOW (ref 12.0–15.0)
Immature Granulocytes: 1 %
Lymphocytes Relative: 14 %
Lymphs Abs: 2.5 10*3/uL (ref 0.7–4.0)
MCH: 32.8 pg (ref 26.0–34.0)
MCHC: 31.5 g/dL (ref 30.0–36.0)
MCV: 104.1 fL — ABNORMAL HIGH (ref 80.0–100.0)
Monocytes Absolute: 0.9 10*3/uL (ref 0.1–1.0)
Monocytes Relative: 5 %
Neutro Abs: 13.4 10*3/uL — ABNORMAL HIGH (ref 1.7–7.7)
Neutrophils Relative %: 78 %
Platelet Count: 161 10*3/uL (ref 150–400)
RBC: 3.14 MIL/uL — ABNORMAL LOW (ref 3.87–5.11)
RDW: 15.4 % (ref 11.5–15.5)
WBC Count: 17.1 10*3/uL — ABNORMAL HIGH (ref 4.0–10.5)
nRBC: 0 % (ref 0.0–0.2)

## 2018-11-11 LAB — CMP (CANCER CENTER ONLY)
ALT: 28 U/L (ref 0–44)
AST: 39 U/L (ref 15–41)
Albumin: 3.8 g/dL (ref 3.5–5.0)
Alkaline Phosphatase: 319 U/L — ABNORMAL HIGH (ref 38–126)
Anion gap: 8 (ref 5–15)
BUN: 9 mg/dL (ref 6–20)
CO2: 29 mmol/L (ref 22–32)
Calcium: 9.4 mg/dL (ref 8.9–10.3)
Chloride: 103 mmol/L (ref 98–111)
Creatinine: 0.64 mg/dL (ref 0.44–1.00)
GFR, Est AFR Am: >60 mL/min
GFR, Estimated: >60 mL/min
Glucose, Bld: 99 mg/dL (ref 70–99)
Potassium: 4.2 mmol/L (ref 3.5–5.1)
Sodium: 140 mmol/L (ref 135–145)
Total Bilirubin: 0.3 mg/dL (ref 0.3–1.2)
Total Protein: 7.8 g/dL (ref 6.5–8.1)

## 2018-11-11 MED ORDER — SODIUM CHLORIDE 0.9% FLUSH
10.0000 mL | INTRAVENOUS | Status: DC | PRN
  Administered 2018-11-11: 10 mL
  Filled 2018-11-11: qty 10

## 2018-11-11 MED ORDER — HEPARIN SOD (PORK) LOCK FLUSH 100 UNIT/ML IV SOLN
500.0000 [IU] | Freq: Once | INTRAVENOUS | Status: AC | PRN
  Administered 2018-11-11: 500 [IU]
  Filled 2018-11-11: qty 5

## 2018-11-18 ENCOUNTER — Inpatient Hospital Stay: Payer: Medicare Other

## 2018-11-18 DIAGNOSIS — Z5111 Encounter for antineoplastic chemotherapy: Secondary | ICD-10-CM | POA: Diagnosis not present

## 2018-11-18 DIAGNOSIS — Z7689 Persons encountering health services in other specified circumstances: Secondary | ICD-10-CM | POA: Diagnosis not present

## 2018-11-18 DIAGNOSIS — C7800 Secondary malignant neoplasm of unspecified lung: Secondary | ICD-10-CM | POA: Diagnosis not present

## 2018-11-18 DIAGNOSIS — C3492 Malignant neoplasm of unspecified part of left bronchus or lung: Secondary | ICD-10-CM | POA: Diagnosis not present

## 2018-11-18 DIAGNOSIS — Z95828 Presence of other vascular implants and grafts: Secondary | ICD-10-CM

## 2018-11-18 DIAGNOSIS — C7951 Secondary malignant neoplasm of bone: Secondary | ICD-10-CM | POA: Diagnosis not present

## 2018-11-18 DIAGNOSIS — Z79899 Other long term (current) drug therapy: Secondary | ICD-10-CM | POA: Diagnosis not present

## 2018-11-18 DIAGNOSIS — C787 Secondary malignant neoplasm of liver and intrahepatic bile duct: Secondary | ICD-10-CM | POA: Diagnosis not present

## 2018-11-18 DIAGNOSIS — C3412 Malignant neoplasm of upper lobe, left bronchus or lung: Secondary | ICD-10-CM | POA: Diagnosis not present

## 2018-11-18 LAB — CBC WITH DIFFERENTIAL (CANCER CENTER ONLY)
Abs Immature Granulocytes: 0.08 10*3/uL — ABNORMAL HIGH (ref 0.00–0.07)
Basophils Absolute: 0.1 10*3/uL (ref 0.0–0.1)
Basophils Relative: 1 %
Eosinophils Absolute: 0.1 10*3/uL (ref 0.0–0.5)
Eosinophils Relative: 1 %
HCT: 35.9 % — ABNORMAL LOW (ref 36.0–46.0)
Hemoglobin: 11.5 g/dL — ABNORMAL LOW (ref 12.0–15.0)
Immature Granulocytes: 1 %
LYMPHS PCT: 31 %
Lymphs Abs: 2.8 10*3/uL (ref 0.7–4.0)
MCH: 32.9 pg (ref 26.0–34.0)
MCHC: 32 g/dL (ref 30.0–36.0)
MCV: 102.6 fL — ABNORMAL HIGH (ref 80.0–100.0)
Monocytes Absolute: 0.9 10*3/uL (ref 0.1–1.0)
Monocytes Relative: 10 %
Neutro Abs: 5.1 10*3/uL (ref 1.7–7.7)
Neutrophils Relative %: 56 %
PLATELETS: 74 10*3/uL — AB (ref 150–400)
RBC: 3.5 MIL/uL — ABNORMAL LOW (ref 3.87–5.11)
RDW: 15.5 % (ref 11.5–15.5)
WBC Count: 9 10*3/uL (ref 4.0–10.5)
nRBC: 0 % (ref 0.0–0.2)

## 2018-11-18 LAB — CMP (CANCER CENTER ONLY)
ALT: 31 U/L (ref 0–44)
ANION GAP: 10 (ref 5–15)
AST: 38 U/L (ref 15–41)
Albumin: 3.8 g/dL (ref 3.5–5.0)
Alkaline Phosphatase: 256 U/L — ABNORMAL HIGH (ref 38–126)
BUN: 9 mg/dL (ref 6–20)
CO2: 28 mmol/L (ref 22–32)
Calcium: 9.7 mg/dL (ref 8.9–10.3)
Chloride: 102 mmol/L (ref 98–111)
Creatinine: 0.67 mg/dL (ref 0.44–1.00)
GFR, Est AFR Am: >60 mL/min (ref >60)
GFR, Estimated: >60 mL/min (ref >60)
GLUCOSE: 103 mg/dL — AB (ref 70–99)
Potassium: 4.3 mmol/L (ref 3.5–5.1)
Sodium: 140 mmol/L (ref 135–145)
Total Bilirubin: 0.3 mg/dL (ref 0.3–1.2)
Total Protein: 8.1 g/dL (ref 6.5–8.1)

## 2018-11-18 MED ORDER — SODIUM CHLORIDE 0.9% FLUSH
10.0000 mL | INTRAVENOUS | Status: DC | PRN
  Administered 2018-11-18: 10 mL
  Filled 2018-11-18: qty 10

## 2018-11-18 MED ORDER — HEPARIN SOD (PORK) LOCK FLUSH 100 UNIT/ML IV SOLN
500.0000 [IU] | Freq: Once | INTRAVENOUS | Status: AC | PRN
  Administered 2018-11-18: 500 [IU]
  Filled 2018-11-18: qty 5

## 2018-11-25 ENCOUNTER — Telehealth: Payer: Self-pay | Admitting: Internal Medicine

## 2018-11-25 ENCOUNTER — Encounter: Payer: Self-pay | Admitting: Internal Medicine

## 2018-11-25 ENCOUNTER — Inpatient Hospital Stay: Payer: Medicare Other

## 2018-11-25 ENCOUNTER — Inpatient Hospital Stay (HOSPITAL_BASED_OUTPATIENT_CLINIC_OR_DEPARTMENT_OTHER): Payer: Medicare Other | Admitting: Internal Medicine

## 2018-11-25 ENCOUNTER — Other Ambulatory Visit: Payer: Self-pay | Admitting: *Deleted

## 2018-11-25 ENCOUNTER — Inpatient Hospital Stay: Payer: Medicare Other | Attending: Internal Medicine

## 2018-11-25 ENCOUNTER — Other Ambulatory Visit: Payer: Self-pay | Admitting: Internal Medicine

## 2018-11-25 ENCOUNTER — Other Ambulatory Visit: Payer: Self-pay | Admitting: Family Medicine

## 2018-11-25 VITALS — BP 135/65 | HR 95 | Temp 98.2°F | Resp 16 | Ht 64.0 in | Wt 190.4 lb

## 2018-11-25 DIAGNOSIS — C3412 Malignant neoplasm of upper lobe, left bronchus or lung: Secondary | ICD-10-CM | POA: Insufficient documentation

## 2018-11-25 DIAGNOSIS — C3492 Malignant neoplasm of unspecified part of left bronchus or lung: Secondary | ICD-10-CM

## 2018-11-25 DIAGNOSIS — M797 Fibromyalgia: Secondary | ICD-10-CM | POA: Insufficient documentation

## 2018-11-25 DIAGNOSIS — Z79899 Other long term (current) drug therapy: Secondary | ICD-10-CM

## 2018-11-25 DIAGNOSIS — I10 Essential (primary) hypertension: Secondary | ICD-10-CM | POA: Insufficient documentation

## 2018-11-25 DIAGNOSIS — K219 Gastro-esophageal reflux disease without esophagitis: Secondary | ICD-10-CM | POA: Insufficient documentation

## 2018-11-25 DIAGNOSIS — E785 Hyperlipidemia, unspecified: Secondary | ICD-10-CM | POA: Diagnosis not present

## 2018-11-25 DIAGNOSIS — C7800 Secondary malignant neoplasm of unspecified lung: Secondary | ICD-10-CM

## 2018-11-25 DIAGNOSIS — Z5112 Encounter for antineoplastic immunotherapy: Secondary | ICD-10-CM | POA: Diagnosis not present

## 2018-11-25 DIAGNOSIS — J449 Chronic obstructive pulmonary disease, unspecified: Secondary | ICD-10-CM | POA: Insufficient documentation

## 2018-11-25 DIAGNOSIS — E559 Vitamin D deficiency, unspecified: Secondary | ICD-10-CM | POA: Diagnosis not present

## 2018-11-25 DIAGNOSIS — J8410 Pulmonary fibrosis, unspecified: Secondary | ICD-10-CM | POA: Insufficient documentation

## 2018-11-25 DIAGNOSIS — C7951 Secondary malignant neoplasm of bone: Secondary | ICD-10-CM | POA: Diagnosis not present

## 2018-11-25 DIAGNOSIS — Z87891 Personal history of nicotine dependence: Secondary | ICD-10-CM | POA: Insufficient documentation

## 2018-11-25 DIAGNOSIS — K589 Irritable bowel syndrome without diarrhea: Secondary | ICD-10-CM | POA: Diagnosis not present

## 2018-11-25 DIAGNOSIS — C787 Secondary malignant neoplasm of liver and intrahepatic bile duct: Secondary | ICD-10-CM | POA: Diagnosis not present

## 2018-11-25 DIAGNOSIS — Z7189 Other specified counseling: Secondary | ICD-10-CM

## 2018-11-25 DIAGNOSIS — Z8541 Personal history of malignant neoplasm of cervix uteri: Secondary | ICD-10-CM | POA: Insufficient documentation

## 2018-11-25 DIAGNOSIS — R5382 Chronic fatigue, unspecified: Secondary | ICD-10-CM

## 2018-11-25 DIAGNOSIS — Z95828 Presence of other vascular implants and grafts: Secondary | ICD-10-CM

## 2018-11-25 LAB — CBC WITH DIFFERENTIAL (CANCER CENTER ONLY)
Abs Immature Granulocytes: 0.01 10*3/uL (ref 0.00–0.07)
BASOS PCT: 1 %
Basophils Absolute: 0.1 10*3/uL (ref 0.0–0.1)
EOS ABS: 0.1 10*3/uL (ref 0.0–0.5)
Eosinophils Relative: 2 %
HCT: 34.8 % — ABNORMAL LOW (ref 36.0–46.0)
Hemoglobin: 11.1 g/dL — ABNORMAL LOW (ref 12.0–15.0)
Immature Granulocytes: 0 %
Lymphocytes Relative: 35 %
Lymphs Abs: 2.1 10*3/uL (ref 0.7–4.0)
MCH: 33.1 pg (ref 26.0–34.0)
MCHC: 31.9 g/dL (ref 30.0–36.0)
MCV: 103.9 fL — ABNORMAL HIGH (ref 80.0–100.0)
Monocytes Absolute: 0.8 10*3/uL (ref 0.1–1.0)
Monocytes Relative: 13 %
Neutro Abs: 2.9 10*3/uL (ref 1.7–7.7)
Neutrophils Relative %: 49 %
PLATELETS: 159 10*3/uL (ref 150–400)
RBC: 3.35 MIL/uL — ABNORMAL LOW (ref 3.87–5.11)
RDW: 15.5 % (ref 11.5–15.5)
WBC Count: 5.9 10*3/uL (ref 4.0–10.5)
nRBC: 0 % (ref 0.0–0.2)

## 2018-11-25 LAB — CMP (CANCER CENTER ONLY)
ALT: 23 U/L (ref 0–44)
AST: 39 U/L (ref 15–41)
Albumin: 3.6 g/dL (ref 3.5–5.0)
Alkaline Phosphatase: 231 U/L — ABNORMAL HIGH (ref 38–126)
Anion gap: 9 (ref 5–15)
BUN: 8 mg/dL (ref 6–20)
CO2: 27 mmol/L (ref 22–32)
Calcium: 9.4 mg/dL (ref 8.9–10.3)
Chloride: 105 mmol/L (ref 98–111)
Creatinine: 0.68 mg/dL (ref 0.44–1.00)
GFR, Est AFR Am: >60 mL/min (ref >60)
GFR, Estimated: >60 mL/min (ref >60)
Glucose, Bld: 109 mg/dL — ABNORMAL HIGH (ref 70–99)
Potassium: 3.9 mmol/L (ref 3.5–5.1)
SODIUM: 141 mmol/L (ref 135–145)
Total Bilirubin: 0.2 mg/dL — ABNORMAL LOW (ref 0.3–1.2)
Total Protein: 7.9 g/dL (ref 6.5–8.1)

## 2018-11-25 LAB — TSH: TSH: 19.37 u[IU]/mL — ABNORMAL HIGH (ref 0.308–3.960)

## 2018-11-25 MED ORDER — SODIUM CHLORIDE 0.9 % IV SOLN
1200.0000 mg | Freq: Once | INTRAVENOUS | Status: AC
  Administered 2018-11-25: 1200 mg via INTRAVENOUS
  Filled 2018-11-25: qty 20

## 2018-11-25 MED ORDER — SODIUM CHLORIDE 0.9 % IV SOLN
Freq: Once | INTRAVENOUS | Status: AC
  Administered 2018-11-25: 10:00:00 via INTRAVENOUS
  Filled 2018-11-25: qty 250

## 2018-11-25 MED ORDER — SODIUM CHLORIDE 0.9% FLUSH
10.0000 mL | INTRAVENOUS | Status: DC | PRN
  Administered 2018-11-25: 10 mL
  Filled 2018-11-25: qty 10

## 2018-11-25 MED ORDER — HEPARIN SOD (PORK) LOCK FLUSH 100 UNIT/ML IV SOLN
500.0000 [IU] | Freq: Once | INTRAVENOUS | Status: AC | PRN
  Administered 2018-11-25: 500 [IU]
  Filled 2018-11-25: qty 5

## 2018-11-25 MED ORDER — BENZONATATE 100 MG PO CAPS: 100.0000 mg | ORAL_CAPSULE | Freq: Three times a day (TID) | ORAL | 0 refills | Status: DC | PRN

## 2018-11-25 MED ORDER — LEVOTHYROXINE SODIUM 50 MCG PO TABS: 50.0000 ug | ORAL_TABLET | Freq: Every day | ORAL | 1 refills | Status: DC

## 2018-11-25 NOTE — Patient Instructions (Signed)
[facility] Discharge Instructions for Patients Receiving Chemotherapy  Today you received the following chemotherapy agents: Tecentriq  To help prevent nausea and vomiting after your treatment, we encourage you to take your nausea medication as directed.   If you develop nausea and vomiting that is not controlled by your nausea medication, call the clinic.   BELOW ARE SYMPTOMS THAT SHOULD BE REPORTED IMMEDIATELY:  *FEVER GREATER THAN 100.5 F  *CHILLS WITH OR WITHOUT FEVER  NAUSEA AND VOMITING THAT IS NOT CONTROLLED WITH YOUR NAUSEA MEDICATION  *UNUSUAL SHORTNESS OF BREATH  *UNUSUAL BRUISING OR BLEEDING  TENDERNESS IN MOUTH AND THROAT WITH OR WITHOUT PRESENCE OF ULCERS  *URINARY PROBLEMS  *BOWEL PROBLEMS  UNUSUAL RASH Items with * indicate a potential emergency and should be followed up as soon as possible.  Feel free to call the clinic should you have any questions or concerns. The clinic phone number is [phone].  Please show the CHEMO ALERT CARD at check-in to the Emergency Department and triage nurse.   

## 2018-11-25 NOTE — Progress Notes (Signed)
[place] Telephone:([phone]) [phone]   [phone]  OFFICE PROGRESS NOTE  [name], MD [place] [address]  DIAGNOSIS: Extensive stage (T3, N2, M1a)  small cell lung cancer diagnosed in August 2019 and presented with large left hilar/subhilar mass in addition to mediastinal lymphadenopathy and contracture and mass in the right upper lobe.  PRIOR THERAPY:None  CURRENT THERAPY: Therapy with carboplatin for AUC of 5 on day 1, etoposide 100 mg/M2 on days 1, 2 and 3 as well as Tecentriq (Atezolizumab) 1200 mg IV every 3 weeks with Neulasta support.  Status post 6 cycles.  Starting from cycle #7, the patient will be treated with only single agent immunotherapy with Tecentriq 1200 mg IV every 3 weeks.  INTERVAL HISTORY: [name] 63 y.o. female returns to the clinic today for follow-up visit accompanied by her husband.  The patient is feeling fine today with no concerning complaints except for dry cough and she is requesting refill of Tessalon.  She denied having any chest pain, shortness of breath or hemoptysis.  She denied having any recent weight loss or night sweats.  She has no nausea, vomiting, diarrhea or constipation.  She denied having any headache or visual changes.  She was supposed to have repeat CT scan of the chest, abdomen and pelvis before this visit but unfortunately no one called her from the hospital to schedule the scan.  She is here today for evaluation before starting cycle #7.  MEDICAL HISTORY: Past Medical History:  Diagnosis Date  . Alpha-1-antitrypsin deficiency ([place])   . Cervical cancer ([place]) 2004  . 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 This doesn't fit entirely with emphysema.      . Exposure to hepatitis C 09/12/2016  . Fibromyalgia   .  GERD (gastroesophageal reflux disease)   . Hyperlipidemia   . IBS (irritable bowel syndrome)   . Obstructive sleep apnea 06/02/2008   NPSG 06/07/08, AHI 6.9/ hr, weight 220 lbs    . Osteoporosis   . Pulmonary fibrosis, unspecified ([place]) 09/29/2016   CXR 03/28/2016  . Rhinitis, nonallergic 05/08/2012  . 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 Vitamin D deficiency     ALLERGIES:  is allergic to penicillins; actonel [risedronate sodium]; advair diskus [fluticasone-salmeterol]; alendronate sodium; aspirin; azithromycin; keflex [cephalexin]; levofloxacin; morphine; nsaids; omnicef [cefdinir]; erythromycin; and iodine.  MEDICATIONS:  Current Outpatient Medications  Medication Sig Dispense Refill  . albuterol (PROVENTIL) (2.5 MG/3ML) 0.083% nebulizer solution Take 3 mLs (2.5 mg total) by nebulization every 6 (six) hours as needed. 90 mL 4  . allopurinol (ZYLOPRIM) 300 MG tablet Take 1 tablet (300 mg total) by mouth daily. Begin on 07/25/2018 14 tablet 0  . amitriptyline (ELAVIL) 75 MG tablet Take 75 mg by mouth at bedtime.      . benzonatate (TESSALON) 100 MG capsule Take 1 capsule (100 mg total) by mouth 3 (three) times daily as needed for cough. 20 capsule 0  . ezetimibe (ZETIA) 10 MG tablet TAKE 1 TABLET ONCE A DAY 90 tablet 0  . feeding supplement, ENSURE ENLIVE, (ENSURE ENLIVE) LIQD Take 237 mLs by mouth 2 (two) times daily between meals. 60 Bottle 0  . fluticasone (FLONASE) 50 MCG/ACT nasal spray USE 2 SPRAYS IN EACH NOSTRIL ONCE DAILY.  16 g 5  . folic acid (FOLVITE) 1 MG tablet Take 1 mg by mouth daily.    . Glycopyrrolate-Formoterol (BEVESPI AEROSPHERE) 9-4.8 MCG/ACT AERO Inhale 2 puffs into the lungs 2 (two) times daily. 1 Inhaler 12  . HYDROcodone-acetaminophen (NORCO/VICODIN) 5-325 MG tablet Take 1 tablet by mouth 3 (three) times daily. 30 tablet 0  . lidocaine-prilocaine (EMLA) cream Apply 1 application topically as needed. 30 g 0  . Nebulizers  (COMPRESSOR NEBULIZER) MISC 1 Device by Does not apply route as needed. 1 each prn  . omeprazole (PRILOSEC) 40 MG capsule TAKE (1) CAPSULE DAILY 90 capsule 0  . PROAIR HFA 108 (90 Base) MCG/ACT inhaler 2 PUFFS EVERY 4 HOURS AS NEEDED FOR WHEEZING 8.5 g 0  . prochlorperazine (COMPAZINE) 10 MG tablet Take 1 tablet (10 mg total) by mouth every 6 (six) hours as needed for nausea or vomiting. 30 tablet 0  . rosuvastatin (CRESTOR) 40 MG tablet TAKE 1/2 TABLET ONCE DAILY (Patient taking differently: Take 20 mg by mouth daily. ) 45 tablet 1   No current facility-administered medications for this visit.     SURGICAL HISTORY:  Past Surgical History:  Procedure Laterality Date  . ABDOMINAL HYSTERECTOMY    . IR IMAGING GUIDED PORT INSERTION  08/02/2018  . LUMBAR DISC SURGERY    . VIDEO BRONCHOSCOPY Bilateral 07/10/2018   Procedure: VIDEO BRONCHOSCOPY WITHOUT FLUORO;  Surgeon: Laurin Coder, MD;  Location: [facility];  Service: Cardiopulmonary;  Laterality: Bilateral;    REVIEW OF SYSTEMS:  Constitutional: negative Eyes: negative Ears, nose, mouth, throat, and face: negative Respiratory: positive for cough Cardiovascular: negative Gastrointestinal: negative Genitourinary:negative Integument/breast: negative Hematologic/lymphatic: negative Musculoskeletal:negative Neurological: negative Behavioral/Psych: negative Endocrine: negative Allergic/Immunologic: negative   PHYSICAL EXAMINATION: General appearance: alert, cooperative and no distress Head: Normocephalic, without obvious abnormality, atraumatic Neck: no adenopathy, no JVD, supple, symmetrical, trachea midline and thyroid not enlarged, symmetric, no tenderness/mass/nodules Lymph nodes: Cervical, supraclavicular, and axillary nodes normal. Resp: clear to auscultation bilaterally Back: symmetric, no curvature. ROM normal. No CVA tenderness. Cardio: regular rate and rhythm, S1, S2 normal, no murmur, click, rub or gallop GI: soft,  non-tender; bowel sounds normal; no masses,  no organomegaly Extremities: extremities normal, atraumatic, no cyanosis or edema Neurologic: Alert and oriented X 3, normal strength and tone. Normal symmetric reflexes. Normal coordination and gait  ECOG PERFORMANCE STATUS: 1 - Symptomatic but completely ambulatory  Blood pressure 135/65, pulse 95, temperature 98.2 F (36.8 C), temperature source Oral, resp. rate 16, height 5\' 4"  (1.626 m), weight 190 lb 6.4 oz (86.4 kg), SpO2 93 %.  LABORATORY DATA: Lab Results  Component Value Date   WBC 9.0 11/18/2018   HGB 11.5 (L) 11/18/2018   HCT 35.9 (L) 11/18/2018   MCV 102.6 (H) 11/18/2018   PLT 74 (L) 11/18/2018      Chemistry      Component Value Date/Time   NA 140 11/18/2018 1037   NA 124 (L) 07/16/2018 0853   K 4.3 11/18/2018 1037   CL 102 11/18/2018 1037   CO2 28 11/18/2018 1037   BUN 9 11/18/2018 1037   BUN 8 07/16/2018 0853   CREATININE 0.67 11/18/2018 1037   CREATININE 0.68 02/13/2013 1523      Component Value Date/Time   CALCIUM 9.7 11/18/2018 1037   ALKPHOS 256 (H) 11/18/2018 1037   AST 38 11/18/2018 1037   ALT 31 11/18/2018 1037   BILITOT 0.3 11/18/2018 1037       RADIOGRAPHIC STUDIES: No results found.  ASSESSMENT AND PLAN: This is a very pleasant 63 years old white female recently diagnosed with extensive stage small cell lung cancer.  The patient had a recent PET scan that showed multiple metastatic disease in the lung, liver as well as bone. The patient is currently on systemic chemotherapy with carboplatin, etoposide and Tecentriq status post 6 cycles. She has been tolerating this treatment well with no concerning adverse effects. I discussed with the patient her condition and recommended for her to continue on maintenance treatment with single agent immunotherapy with Tecentriq 1200 mg IV every 3 weeks.  She is expected to start the first cycle of this treatment today. I will arrange for the patient to have the  restaging scan of the chest, abdomen and pelvis performed in the next few days. For the dry cough I will give the patient a refill of Tessalon. Patient will come back for follow-up visit in 3 weeks for evaluation before the next cycle of her treatment. She was advised to call immediately if she has any concerning symptoms in the interval. The patient voices understanding of current disease status and treatment options and is in agreement with the current care plan. All questions were answered. The patient knows to call the clinic with any problems, questions or concerns. We can certainly see the patient much sooner if necessary.  Disclaimer: This note was dictated with voice recognition software. Similar sounding words can inadvertently be transcribed and may not be corrected upon review.

## 2018-11-25 NOTE — Telephone Encounter (Signed)
Scheduled appt per 1/6 los - pt to get an updated schedule next visit .

## 2018-11-26 ENCOUNTER — Ambulatory Visit: Payer: Medicare Other

## 2018-11-27 ENCOUNTER — Ambulatory Visit: Payer: Medicare Other

## 2018-11-28 DIAGNOSIS — Z779 Other contact with and (suspected) exposures hazardous to health: Secondary | ICD-10-CM | POA: Diagnosis not present

## 2018-11-29 ENCOUNTER — Ambulatory Visit: Payer: Medicare Other

## 2018-12-13 ENCOUNTER — Ambulatory Visit (HOSPITAL_COMMUNITY)
Admission: RE | Admit: 2018-12-13 | Discharge: 2018-12-13 | Disposition: A | Discharge status: Home / Self Care | Payer: Medicare Other | Source: Ambulatory Visit | Attending: Internal Medicine | Admitting: Internal Medicine

## 2018-12-13 DIAGNOSIS — C3490 Malignant neoplasm of unspecified part of unspecified bronchus or lung: Secondary | ICD-10-CM | POA: Insufficient documentation

## 2018-12-13 DIAGNOSIS — Z5111 Encounter for antineoplastic chemotherapy: Secondary | ICD-10-CM | POA: Diagnosis not present

## 2018-12-13 MED ORDER — SODIUM CHLORIDE (PF) 0.9 % IJ SOLN
INTRAMUSCULAR | Status: AC
  Filled 2018-12-13: qty 50

## 2018-12-13 MED ORDER — IOHEXOL 300 MG/ML  SOLN
100.0000 mL | Freq: Once | INTRAMUSCULAR | Status: AC | PRN
  Administered 2018-12-13: 100 mL via INTRAVENOUS

## 2018-12-16 ENCOUNTER — Inpatient Hospital Stay (HOSPITAL_BASED_OUTPATIENT_CLINIC_OR_DEPARTMENT_OTHER): Payer: Medicare Other | Admitting: Internal Medicine

## 2018-12-16 ENCOUNTER — Inpatient Hospital Stay: Payer: Medicare Other

## 2018-12-16 ENCOUNTER — Encounter: Payer: Self-pay | Admitting: Internal Medicine

## 2018-12-16 ENCOUNTER — Other Ambulatory Visit: Payer: Self-pay

## 2018-12-16 VITALS — BP 147/85 | HR 82 | Temp 98.1°F | Resp 18 | Ht 64.0 in | Wt 192.4 lb

## 2018-12-16 DIAGNOSIS — C787 Secondary malignant neoplasm of liver and intrahepatic bile duct: Secondary | ICD-10-CM

## 2018-12-16 DIAGNOSIS — C7800 Secondary malignant neoplasm of unspecified lung: Secondary | ICD-10-CM | POA: Diagnosis not present

## 2018-12-16 DIAGNOSIS — C3492 Malignant neoplasm of unspecified part of left bronchus or lung: Secondary | ICD-10-CM | POA: Diagnosis not present

## 2018-12-16 DIAGNOSIS — Z79899 Other long term (current) drug therapy: Secondary | ICD-10-CM | POA: Diagnosis not present

## 2018-12-16 DIAGNOSIS — R5382 Chronic fatigue, unspecified: Secondary | ICD-10-CM

## 2018-12-16 DIAGNOSIS — Z5112 Encounter for antineoplastic immunotherapy: Secondary | ICD-10-CM

## 2018-12-16 DIAGNOSIS — I10 Essential (primary) hypertension: Secondary | ICD-10-CM | POA: Diagnosis not present

## 2018-12-16 DIAGNOSIS — K589 Irritable bowel syndrome without diarrhea: Secondary | ICD-10-CM | POA: Diagnosis not present

## 2018-12-16 DIAGNOSIS — K219 Gastro-esophageal reflux disease without esophagitis: Secondary | ICD-10-CM | POA: Diagnosis not present

## 2018-12-16 DIAGNOSIS — E785 Hyperlipidemia, unspecified: Secondary | ICD-10-CM | POA: Diagnosis not present

## 2018-12-16 DIAGNOSIS — C3412 Malignant neoplasm of upper lobe, left bronchus or lung: Secondary | ICD-10-CM | POA: Diagnosis not present

## 2018-12-16 DIAGNOSIS — Z95828 Presence of other vascular implants and grafts: Secondary | ICD-10-CM

## 2018-12-16 DIAGNOSIS — M797 Fibromyalgia: Secondary | ICD-10-CM | POA: Diagnosis not present

## 2018-12-16 DIAGNOSIS — J8410 Pulmonary fibrosis, unspecified: Secondary | ICD-10-CM | POA: Diagnosis not present

## 2018-12-16 DIAGNOSIS — J449 Chronic obstructive pulmonary disease, unspecified: Secondary | ICD-10-CM | POA: Diagnosis not present

## 2018-12-16 DIAGNOSIS — C7951 Secondary malignant neoplasm of bone: Secondary | ICD-10-CM | POA: Diagnosis not present

## 2018-12-16 DIAGNOSIS — E559 Vitamin D deficiency, unspecified: Secondary | ICD-10-CM | POA: Diagnosis not present

## 2018-12-16 DIAGNOSIS — Z87891 Personal history of nicotine dependence: Secondary | ICD-10-CM | POA: Diagnosis not present

## 2018-12-16 LAB — CMP (CANCER CENTER ONLY)
ALT: 21 U/L (ref 0–44)
AST: 37 U/L (ref 15–41)
Albumin: 3.8 g/dL (ref 3.5–5.0)
Alkaline Phosphatase: 208 U/L — ABNORMAL HIGH (ref 38–126)
Anion gap: 8 (ref 5–15)
BUN: 10 mg/dL (ref 6–20)
CO2: 31 mmol/L (ref 22–32)
Calcium: 9.5 mg/dL (ref 8.9–10.3)
Chloride: 100 mmol/L (ref 98–111)
Creatinine: 0.72 mg/dL (ref 0.44–1.00)
Glucose, Bld: 97 mg/dL (ref 70–99)
Potassium: 4.1 mmol/L (ref 3.5–5.1)
Sodium: 139 mmol/L (ref 135–145)
TOTAL PROTEIN: 8.2 g/dL — AB (ref 6.5–8.1)
Total Bilirubin: 0.4 mg/dL (ref 0.3–1.2)

## 2018-12-16 LAB — CBC WITH DIFFERENTIAL (CANCER CENTER ONLY)
Abs Immature Granulocytes: 0.01 10*3/uL (ref 0.00–0.07)
Basophils Absolute: 0.1 10*3/uL (ref 0.0–0.1)
Basophils Relative: 1 %
Eosinophils Absolute: 0.2 10*3/uL (ref 0.0–0.5)
Eosinophils Relative: 4 %
HCT: 35.8 % — ABNORMAL LOW (ref 36.0–46.0)
Hemoglobin: 11.3 g/dL — ABNORMAL LOW (ref 12.0–15.0)
Immature Granulocytes: 0 %
Lymphocytes Relative: 42 %
Lymphs Abs: 2.9 10*3/uL (ref 0.7–4.0)
MCH: 30.8 pg (ref 26.0–34.0)
MCHC: 31.6 g/dL (ref 30.0–36.0)
MCV: 97.5 fL (ref 80.0–100.0)
Monocytes Absolute: 0.7 10*3/uL (ref 0.1–1.0)
Monocytes Relative: 10 %
Neutro Abs: 3 10*3/uL (ref 1.7–7.7)
Neutrophils Relative %: 43 %
Platelet Count: 127 10*3/uL — ABNORMAL LOW (ref 150–400)
RBC: 3.67 MIL/uL — AB (ref 3.87–5.11)
RDW: 14.2 % (ref 11.5–15.5)
WBC: 6.9 10*3/uL (ref 4.0–10.5)
nRBC: 0 % (ref 0.0–0.2)

## 2018-12-16 LAB — TSH: TSH: 33.057 u[IU]/mL — ABNORMAL HIGH (ref 0.308–3.960)

## 2018-12-16 MED ORDER — SODIUM CHLORIDE 0.9% FLUSH
10.0000 mL | INTRAVENOUS | Status: DC | PRN
  Administered 2018-12-16: 10 mL
  Filled 2018-12-16: qty 10

## 2018-12-16 MED ORDER — SODIUM CHLORIDE 0.9 % IV SOLN
Freq: Once | INTRAVENOUS | Status: AC
  Administered 2018-12-16: 13:00:00 via INTRAVENOUS
  Filled 2018-12-16: qty 250

## 2018-12-16 MED ORDER — SODIUM CHLORIDE 0.9 % IV SOLN
1200.0000 mg | Freq: Once | INTRAVENOUS | Status: AC
  Administered 2018-12-16: 1200 mg via INTRAVENOUS
  Filled 2018-12-16: qty 20

## 2018-12-16 MED ORDER — HEPARIN SOD (PORK) LOCK FLUSH 100 UNIT/ML IV SOLN
500.0000 [IU] | Freq: Once | INTRAVENOUS | Status: AC | PRN
  Administered 2018-12-16: 500 [IU]
  Filled 2018-12-16: qty 5

## 2018-12-16 NOTE — Progress Notes (Signed)
[place] [phone]   [phone]  OFFICE PROGRESS NOTE  [name], MD [place] [address]  DIAGNOSIS: Extensive stage (T3, N2, M1a)  small cell lung cancer diagnosed in August 2019 and presented with large left hilar/subhilar mass in addition to mediastinal lymphadenopathy and contracture and mass in the right upper lobe.  PRIOR THERAPY:None  CURRENT THERAPY: Therapy with carboplatin for AUC of 5 on day 1, etoposide 100 mg/M2 on days 1, 2 and 3 as well as Tecentriq (Atezolizumab) 1200 mg IV every 3 weeks with Neulasta support.  Status post 7 cycles.  Starting from cycle #7, the patient will be treated with only single agent immunotherapy with Tecentriq 1200 mg IV every 3 weeks.  INTERVAL HISTORY: [name] 63 y.o. female returns to the clinic today for follow-up visit accompanied by her husband.  The patient is feeling well today with no concerning complaints.  She denied having any chest pain, shortness of breath, cough or hemoptysis.  She denied having any nausea, vomiting, diarrhea or constipation.  She has no headache or visual changes.  She denied having any weight loss or night sweats.  The patient has been tolerating her treatment with maintenance Tecentriq fairly well.  She had repeat CT scan of the chest, abdomen and pelvis performed recently and she is here for evaluation and discussion of her scan results.  MEDICAL HISTORY: Past Medical History:  Diagnosis Date  . Alpha-1-antitrypsin deficiency ([place])   . Cervical cancer ([place]) 2004  . 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 This doesn't fit entirely with emphysema.      . Exposure to hepatitis C 09/12/2016  . Fibromyalgia   . GERD (gastroesophageal reflux disease)   . Hyperlipidemia   .  IBS (irritable bowel syndrome)   . Obstructive sleep apnea 06/02/2008   NPSG 06/07/08, AHI 6.9/ hr, weight 220 lbs    . Osteoporosis   . Pulmonary fibrosis, unspecified ([place]) 09/29/2016   CXR 03/28/2016  . Rhinitis, nonallergic 05/08/2012  . 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 Vitamin D deficiency     ALLERGIES:  is allergic to penicillins; actonel [risedronate sodium]; advair diskus [fluticasone-salmeterol]; alendronate sodium; aspirin; azithromycin; keflex [cephalexin]; levofloxacin; morphine; nsaids; omnicef [cefdinir]; and erythromycin.  MEDICATIONS:  Current Outpatient Medications  Medication Sig Dispense Refill  . albuterol (PROVENTIL) (2.5 MG/3ML) 0.083% nebulizer solution Take 3 mLs (2.5 mg total) by nebulization every 6 (six) hours as needed. 90 mL 4  . allopurinol (ZYLOPRIM) 300 MG tablet Take 1 tablet (300 mg total) by mouth daily. Begin on 07/25/2018 14 tablet 0  . amitriptyline (ELAVIL) 75 MG tablet Take 75 mg by mouth at bedtime.      . benzonatate (TESSALON) 100 MG capsule Take 1 capsule (100 mg total) by mouth 3 (three) times daily as needed for cough. 20 capsule 0  . ezetimibe (ZETIA) 10 MG tablet TAKE 1 TABLET ONCE A DAY 90 tablet 0  . feeding supplement, ENSURE ENLIVE, (ENSURE ENLIVE) LIQD Take 237 mLs by mouth 2 (two) times daily between meals. 60 Bottle 0  . fluticasone (FLONASE) 50 MCG/ACT nasal spray USE 2 SPRAYS IN EACH NOSTRIL ONCE DAILY. 16 g 5  . folic acid (FOLVITE) 1 MG tablet Take 1 mg by  mouth daily.    . Glycopyrrolate-Formoterol (BEVESPI AEROSPHERE) 9-4.8 MCG/ACT AERO Inhale 2 puffs into the lungs 2 (two) times daily. 1 Inhaler 12  . HYDROcodone-acetaminophen (NORCO/VICODIN) 5-325 MG tablet Take 1 tablet by mouth 3 (three) times daily. 30 tablet 0  . levothyroxine (SYNTHROID) 50 MCG tablet Take 1 tablet (50 mcg total) by mouth daily before breakfast. 30 tablet 1  . lidocaine-prilocaine (EMLA) cream Apply 1 application  topically as needed. 30 g 0  . Nebulizers (COMPRESSOR NEBULIZER) MISC 1 Device by Does not apply route as needed. 1 each prn  . omeprazole (PRILOSEC) 40 MG capsule TAKE (1) CAPSULE DAILY 90 capsule 0  . PROAIR HFA 108 (90 Base) MCG/ACT inhaler 2 PUFFS EVERY 4 HOURS AS NEEDED FOR WHEEZING 8.5 g 0  . prochlorperazine (COMPAZINE) 10 MG tablet Take 1 tablet (10 mg total) by mouth every 6 (six) hours as needed for nausea or vomiting. 30 tablet 0  . rosuvastatin (CRESTOR) 40 MG tablet Take 0.5 tablets (20 mg total) by mouth daily. 45 tablet 3   No current facility-administered medications for this visit.     SURGICAL HISTORY:  Past Surgical History:  Procedure Laterality Date  . ABDOMINAL HYSTERECTOMY    . IR IMAGING GUIDED PORT INSERTION  08/02/2018  . LUMBAR DISC SURGERY    . VIDEO BRONCHOSCOPY Bilateral 07/10/2018   Procedure: VIDEO BRONCHOSCOPY WITHOUT FLUORO;  Surgeon: Laurin Coder, MD;  Location: [facility];  Service: Cardiopulmonary;  Laterality: Bilateral;    REVIEW OF SYSTEMS:  Constitutional: negative Eyes: negative Ears, nose, mouth, throat, and face: negative Respiratory: negative Cardiovascular: negative Gastrointestinal: negative Genitourinary:negative Integument/breast: negative Hematologic/lymphatic: negative Musculoskeletal:negative Neurological: negative Behavioral/Psych: negative Endocrine: negative Allergic/Immunologic: negative   PHYSICAL EXAMINATION: General appearance: alert, cooperative and no distress Head: Normocephalic, without obvious abnormality, atraumatic Neck: no adenopathy, no JVD, supple, symmetrical, trachea midline and thyroid not enlarged, symmetric, no tenderness/mass/nodules Lymph nodes: Cervical, supraclavicular, and axillary nodes normal. Resp: clear to auscultation bilaterally Back: symmetric, no curvature. ROM normal. No CVA tenderness. Cardio: regular rate and rhythm, S1, S2 normal, no murmur, click, rub or gallop GI: soft,  non-tender; bowel sounds normal; no masses,  no organomegaly Extremities: extremities normal, atraumatic, no cyanosis or edema Neurologic: Alert and oriented X 3, normal strength and tone. Normal symmetric reflexes. Normal coordination and gait  ECOG PERFORMANCE STATUS: 1 - Symptomatic but completely ambulatory  Blood pressure (!) 147/85, pulse 82, temperature 98.1 F (36.7 C), temperature source Oral, resp. rate 18, height 5\' 4"  (1.626 m), weight 192 lb 6.4 oz (87.3 kg), SpO2 95 %.  LABORATORY DATA: Lab Results  Component Value Date   WBC 6.9 12/16/2018   HGB 11.3 (L) 12/16/2018   HCT 35.8 (L) 12/16/2018   MCV 97.5 12/16/2018   PLT 127 (L) 12/16/2018      Chemistry      Component Value Date/Time   NA 139 12/16/2018 1106   NA 124 (L) 07/16/2018 0853   K 4.1 12/16/2018 1106   CL 100 12/16/2018 1106   CO2 31 12/16/2018 1106   BUN 10 12/16/2018 1106   BUN 8 07/16/2018 0853   CREATININE 0.72 12/16/2018 1106   CREATININE 0.68 02/13/2013 1523      Component Value Date/Time   CALCIUM 9.5 12/16/2018 1106   ALKPHOS 208 (H) 12/16/2018 1106   AST 37 12/16/2018 1106   ALT 21 12/16/2018 1106   BILITOT 0.4 12/16/2018 1106       RADIOGRAPHIC STUDIES: Ct Chest W Contrast  Result  Date: 12/13/2018 CLINICAL DATA:  Small-cell lung cancer, ongoing immunotherapy. Lower abdominal pain for 1 month. History of cervical cancer. EXAM: CT CHEST, ABDOMEN, AND PELVIS WITH CONTRAST TECHNIQUE: Multidetector CT imaging of the chest, abdomen and pelvis was performed following the standard protocol during bolus administration of intravenous contrast. CONTRAST:  164mL OMNIPAQUE IOHEXOL 300 MG/ML  SOLN COMPARISON:  10/11/2018 FINDINGS: CT CHEST FINDINGS Cardiovascular: Right Port-A-Cath tip: Right atrium. Coronary, aortic arch, and branch vessel atherosclerotic vascular disease. Mediastinum/Nodes: AP window lymph node 1.1 cm in short axis on image 23/2, stable. Left infrahilar node 0.8 cm in short axis on  image 29/2 stable. Other upper normal sized mediastinal lymph nodes likewise appear stable. Lungs/Pleura: As before, the prior left infrahilar mass manifests primarily as infrahilar peribronchovascular nodularity resembling small lymph nodes, and is unchanged from the 10/11/2018 exam. Centrilobular emphysema with stable accentuated peripheral interstitium and mild mosaic attenuation. Stable scarring in the lung bases with some additional posterior basal segment atelectasis in the right lower lobe. Adjacent to this atelectasis and on image 97/6, there is some new right lower lobe nodularity, manifesting as 2 adjacent 0.7 cm nodules on image 97/6. Wall possibly from rounded atelectasis given the adjacent bandlike appearance, surveillance of these nodules is indicated. There is a suggestion of a 5 mm nodule in the left lower lobe on image 102/6 which is not well seen previously and only visible long a bandlike density. Airway thickening is present especially involving the lower lobes. Musculoskeletal: Indistinct sclerotic lesions at T3, T5, and T10 are similar to the prior exam. There also indistinct sclerotic lesions at T7 and T12 which are stable. Stable appearance of left lateral ninth rib fracture with late phase healing response. CT ABDOMEN PELVIS FINDINGS Hepatobiliary: Cirrhosis. Stable faint sub-centimeter hypodensities the right and left hepatic lobes. Gallbladder unremarkable. No biliary dilatation. Pancreas: Unremarkable Spleen: Unremarkable Adrenals/Urinary Tract: Mildly asymmetric thickening the right inferior wall of the urinary bladder for example on image 74/4, significance uncertain. Stomach/Bowel: Unremarkable Vascular/Lymphatic: Aortoiliac atherosclerotic vascular disease. Reactive lymph nodes in the porta hepatis likely related to the patient's cirrhosis. Prior pelvic lymph node dissection. A right external iliac node measures 0.9 cm in short axis on image 122/2, formerly 1.0 cm. Reproductive:  Uterus absent.  Adnexa unremarkable. Other: No supplemental non-categorized findings. Musculoskeletal: 2.4 by 1.7 cm sclerotic lesion along the iliac side of the right SI joint, stable from 10/11/2018. Stable sclerotic lesion inferiorly in the L3 vertebral body. Indistinct sclerotic lesions at L5. Interbody cages at L4-5. 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 IMPRESSION: 1. Overall stable appearance of mild adenopathy in the chest (especially the AP window lymph node measuring 1.1 cm in short axis) and scattered sclerotic bony lesions. 2. Increased atelectasis in the right lower lobe associated with 2 adjacent 7 mm nodules along the area of atelectasis. This could be from rounded atelectasis but malignancy is not excluded and attention to the right lower lobe on follow up exams is recommended. 3. Mildly asymmetric thickening of the right urinary bladder. This could be related to prior inflammation/therapy. Strictly speaking, urothelial tumor is not totally excluded. Cystoscopy could be performed if clinically warranted, particularly if the patient has hematuria. 4. Other imaging findings of potential clinical significance: Aortic Atherosclerosis (ICD10-I70.0) and Emphysema (ICD10-J43.9). Coronary atherosclerosis. Airway thickening is present, suggesting bronchitis or reactive airways disease. 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 Hepatic cirrhosis with several small hypodense lesions in the liver which are stable and likely benign. Electronically Signed   By:  Van Clines M.D.   On: 12/13/2018 15:39   Ct Abdomen Pelvis W Contrast  Result Date: 12/13/2018 CLINICAL DATA:  Small-cell lung cancer, ongoing immunotherapy. Lower abdominal pain for 1 month. History of cervical cancer. EXAM: CT CHEST, ABDOMEN, AND PELVIS WITH CONTRAST TECHNIQUE: Multidetector CT imaging of the chest, abdomen and pelvis was performed following the standard protocol during bolus administration of intravenous  contrast. CONTRAST:  149mL OMNIPAQUE IOHEXOL 300 MG/ML  SOLN COMPARISON:  10/11/2018 FINDINGS: CT CHEST FINDINGS Cardiovascular: Right Port-A-Cath tip: Right atrium. Coronary, aortic arch, and branch vessel atherosclerotic vascular disease. Mediastinum/Nodes: AP window lymph node 1.1 cm in short axis on image 23/2, stable. Left infrahilar node 0.8 cm in short axis on image 29/2 stable. Other upper normal sized mediastinal lymph nodes likewise appear stable. Lungs/Pleura: As before, the prior left infrahilar mass manifests primarily as infrahilar peribronchovascular nodularity resembling small lymph nodes, and is unchanged from the 10/11/2018 exam. Centrilobular emphysema with stable accentuated peripheral interstitium and mild mosaic attenuation. Stable scarring in the lung bases with some additional posterior basal segment atelectasis in the right lower lobe. Adjacent to this atelectasis and on image 97/6, there is some new right lower lobe nodularity, manifesting as 2 adjacent 0.7 cm nodules on image 97/6. Wall possibly from rounded atelectasis given the adjacent bandlike appearance, surveillance of these nodules is indicated. There is a suggestion of a 5 mm nodule in the left lower lobe on image 102/6 which is not well seen previously and only visible long a bandlike density. Airway thickening is present especially involving the lower lobes. Musculoskeletal: Indistinct sclerotic lesions at T3, T5, and T10 are similar to the prior exam. There also indistinct sclerotic lesions at T7 and T12 which are stable. Stable appearance of left lateral ninth rib fracture with late phase healing response. CT ABDOMEN PELVIS FINDINGS Hepatobiliary: Cirrhosis. Stable faint sub-centimeter hypodensities the right and left hepatic lobes. Gallbladder unremarkable. No biliary dilatation. Pancreas: Unremarkable Spleen: Unremarkable Adrenals/Urinary Tract: Mildly asymmetric thickening the right inferior wall of the urinary bladder for  example on image 74/4, significance uncertain. Stomach/Bowel: Unremarkable Vascular/Lymphatic: Aortoiliac atherosclerotic vascular disease. Reactive lymph nodes in the porta hepatis likely related to the patient's cirrhosis. Prior pelvic lymph node dissection. A right external iliac node measures 0.9 cm in short axis on image 122/2, formerly 1.0 cm. Reproductive: Uterus absent.  Adnexa unremarkable. Other: No supplemental non-categorized findings. Musculoskeletal: 2.4 by 1.7 cm sclerotic lesion along the iliac side of the right SI joint, stable from 10/11/2018. Stable sclerotic lesion inferiorly in the L3 vertebral body. Indistinct sclerotic lesions at L5. Interbody cages at L4-5. 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 IMPRESSION: 1. Overall stable appearance of mild adenopathy in the chest (especially the AP window lymph node measuring 1.1 cm in short axis) and scattered sclerotic bony lesions. 2. Increased atelectasis in the right lower lobe associated with 2 adjacent 7 mm nodules along the area of atelectasis. This could be from rounded atelectasis but malignancy is not excluded and attention to the right lower lobe on follow up exams is recommended. 3. Mildly asymmetric thickening of the right urinary bladder. This could be related to prior inflammation/therapy. Strictly speaking, urothelial tumor is not totally excluded. Cystoscopy could be performed if clinically warranted, particularly if the patient has hematuria. 4. Other imaging findings of potential clinical significance: Aortic Atherosclerosis (ICD10-I70.0) and Emphysema (ICD10-J43.9). Coronary atherosclerosis. Airway thickening is present, suggesting bronchitis or reactive airways disease. 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 Hepatic cirrhosis with  several small hypodense lesions in the liver which are stable and likely benign. Electronically Signed   By: Van Clines M.D.   On: 12/13/2018 15:39    ASSESSMENT AND PLAN: This is  a very pleasant 63 years old white female recently diagnosed with extensive stage small cell lung cancer.  The patient had a recent PET scan that showed multiple metastatic disease in the lung, liver as well as bone. The patient is currently on systemic chemotherapy with carboplatin, etoposide and Tecentriq status post 6 cycles. She tolerated the induction phase of her treatment well with no concerning complaints. The patient was a started on maintenance treatment with single agent Tecentriq status post 1 cycle. She had repeat CT scan of the chest, abdomen and pelvis performed recently.  I personally and independently reviewed the scans and discussed the results with the patient and her husband. Her scan showed no concerning findings for disease progression. I recommended for the patient to continue her current treatment with maintenance Tecentriq and she will proceed with cycle #2 today. I will see her back for follow-up visit in 3 weeks for evaluation before the next cycle of her treatment. We will continue to monitor her thyroid function closely during her treatment with immunotherapy. The patient was advised to call immediately if she has any concerning symptoms in the interval. The patient voices understanding of current disease status and treatment options and is in agreement with the current care plan. All questions were answered. The patient knows to call the clinic with any problems, questions or concerns. We can certainly see the patient much sooner if necessary.  Disclaimer: This note was dictated with voice recognition software. Similar sounding words can inadvertently be transcribed and may not be corrected upon review.

## 2018-12-16 NOTE — Patient Instructions (Signed)
[facility] Discharge Instructions for Patients Receiving Chemotherapy  Today you received the following chemotherapy agents: Tecentriq  To help prevent nausea and vomiting after your treatment, we encourage you to take your nausea medication as directed.   If you develop nausea and vomiting that is not controlled by your nausea medication, call the clinic.   BELOW ARE SYMPTOMS THAT SHOULD BE REPORTED IMMEDIATELY:  *FEVER GREATER THAN 100.5 F  *CHILLS WITH OR WITHOUT FEVER  NAUSEA AND VOMITING THAT IS NOT CONTROLLED WITH YOUR NAUSEA MEDICATION  *UNUSUAL SHORTNESS OF BREATH  *UNUSUAL BRUISING OR BLEEDING  TENDERNESS IN MOUTH AND THROAT WITH OR WITHOUT PRESENCE OF ULCERS  *URINARY PROBLEMS  *BOWEL PROBLEMS  UNUSUAL RASH Items with * indicate a potential emergency and should be followed up as soon as possible.  Feel free to call the clinic should you have any questions or concerns. The clinic phone number is [phone].  Please show the CHEMO ALERT CARD at check-in to the Emergency Department and triage nurse.   

## 2018-12-17 ENCOUNTER — Telehealth: Payer: Self-pay | Admitting: Internal Medicine

## 2018-12-17 NOTE — Telephone Encounter (Signed)
Added another cycle per 1/27 los - pt to get an updated schedule next visit.

## 2018-12-19 DIAGNOSIS — C3492 Malignant neoplasm of unspecified part of left bronchus or lung: Secondary | ICD-10-CM | POA: Diagnosis not present

## 2018-12-23 DIAGNOSIS — R87611 Atypical squamous cells cannot exclude high grade squamous intraepithelial lesion on cytologic smear of cervix (ASC-H): Secondary | ICD-10-CM | POA: Diagnosis not present

## 2018-12-24 ENCOUNTER — Telehealth: Payer: Self-pay | Admitting: Medical Oncology

## 2018-12-24 NOTE — Telephone Encounter (Signed)
Bladder thickening on CT -Dr Pamala Hurry questioning -is this post op from hysterectomy ( 2004) vs related to abnormal vaginal cytologies-" ascus high grade" . Her colpsoscopies are always clear. She would like to speak to [facility] about this and ?referral to urology.

## 2019-01-03 DIAGNOSIS — Z85118 Personal history of other malignant neoplasm of bronchus and lung: Secondary | ICD-10-CM | POA: Diagnosis not present

## 2019-01-03 DIAGNOSIS — N390 Urinary tract infection, site not specified: Secondary | ICD-10-CM | POA: Diagnosis not present

## 2019-01-03 DIAGNOSIS — N3000 Acute cystitis without hematuria: Secondary | ICD-10-CM | POA: Diagnosis not present

## 2019-01-03 DIAGNOSIS — N329 Bladder disorder, unspecified: Secondary | ICD-10-CM | POA: Diagnosis not present

## 2019-01-06 ENCOUNTER — Inpatient Hospital Stay: Payer: Medicare Other | Attending: Internal Medicine

## 2019-01-06 ENCOUNTER — Inpatient Hospital Stay: Payer: Medicare Other

## 2019-01-06 ENCOUNTER — Inpatient Hospital Stay: Payer: Medicare Other | Admitting: Internal Medicine

## 2019-01-06 ENCOUNTER — Encounter: Payer: Self-pay | Admitting: Internal Medicine

## 2019-01-06 ENCOUNTER — Telehealth: Payer: Self-pay | Admitting: Internal Medicine

## 2019-01-06 VITALS — BP 155/78 | HR 84 | Temp 98.1°F | Resp 18 | Ht 63.0 in | Wt 201.8 lb

## 2019-01-06 DIAGNOSIS — Z5112 Encounter for antineoplastic immunotherapy: Secondary | ICD-10-CM | POA: Diagnosis not present

## 2019-01-06 DIAGNOSIS — Z87891 Personal history of nicotine dependence: Secondary | ICD-10-CM | POA: Diagnosis not present

## 2019-01-06 DIAGNOSIS — M797 Fibromyalgia: Secondary | ICD-10-CM | POA: Diagnosis not present

## 2019-01-06 DIAGNOSIS — J449 Chronic obstructive pulmonary disease, unspecified: Secondary | ICD-10-CM | POA: Diagnosis not present

## 2019-01-06 DIAGNOSIS — Z8541 Personal history of malignant neoplasm of cervix uteri: Secondary | ICD-10-CM | POA: Insufficient documentation

## 2019-01-06 DIAGNOSIS — C3492 Malignant neoplasm of unspecified part of left bronchus or lung: Secondary | ICD-10-CM

## 2019-01-06 DIAGNOSIS — Z79899 Other long term (current) drug therapy: Secondary | ICD-10-CM | POA: Insufficient documentation

## 2019-01-06 DIAGNOSIS — K589 Irritable bowel syndrome without diarrhea: Secondary | ICD-10-CM | POA: Insufficient documentation

## 2019-01-06 DIAGNOSIS — J8410 Pulmonary fibrosis, unspecified: Secondary | ICD-10-CM | POA: Diagnosis not present

## 2019-01-06 DIAGNOSIS — I10 Essential (primary) hypertension: Secondary | ICD-10-CM

## 2019-01-06 DIAGNOSIS — C7800 Secondary malignant neoplasm of unspecified lung: Secondary | ICD-10-CM | POA: Insufficient documentation

## 2019-01-06 DIAGNOSIS — C7951 Secondary malignant neoplasm of bone: Secondary | ICD-10-CM | POA: Diagnosis not present

## 2019-01-06 DIAGNOSIS — C787 Secondary malignant neoplasm of liver and intrahepatic bile duct: Secondary | ICD-10-CM

## 2019-01-06 DIAGNOSIS — C3412 Malignant neoplasm of upper lobe, left bronchus or lung: Secondary | ICD-10-CM

## 2019-01-06 DIAGNOSIS — E785 Hyperlipidemia, unspecified: Secondary | ICD-10-CM | POA: Diagnosis not present

## 2019-01-06 DIAGNOSIS — K219 Gastro-esophageal reflux disease without esophagitis: Secondary | ICD-10-CM | POA: Diagnosis not present

## 2019-01-06 DIAGNOSIS — R5382 Chronic fatigue, unspecified: Secondary | ICD-10-CM

## 2019-01-06 DIAGNOSIS — E559 Vitamin D deficiency, unspecified: Secondary | ICD-10-CM | POA: Insufficient documentation

## 2019-01-06 LAB — CMP (CANCER CENTER ONLY)
ALT: 26 U/L (ref 0–44)
AST: 54 U/L — ABNORMAL HIGH (ref 15–41)
Albumin: 4 g/dL (ref 3.5–5.0)
Alkaline Phosphatase: 197 U/L — ABNORMAL HIGH (ref 38–126)
Anion gap: 8 (ref 5–15)
BUN: 5 mg/dL — ABNORMAL LOW (ref 6–20)
CHLORIDE: 84 mmol/L — AB (ref 98–111)
CO2: 26 mmol/L (ref 22–32)
Calcium: 9 mg/dL (ref 8.9–10.3)
Creatinine: 0.66 mg/dL (ref 0.44–1.00)
GFR, Est AFR Am: >60 mL/min (ref >60)
GFR, Estimated: >60 mL/min (ref >60)
Glucose, Bld: 101 mg/dL — ABNORMAL HIGH (ref 70–99)
Potassium: 4 mmol/L (ref 3.5–5.1)
Sodium: 118 mmol/L — ABNORMAL LOW (ref 135–145)
Total Bilirubin: 0.5 mg/dL (ref 0.3–1.2)
Total Protein: 8.1 g/dL (ref 6.5–8.1)

## 2019-01-06 LAB — CBC WITH DIFFERENTIAL (CANCER CENTER ONLY)
Abs Immature Granulocytes: 0.01 10*3/uL (ref 0.00–0.07)
BASOS ABS: 0 10*3/uL (ref 0.0–0.1)
Basophils Relative: 0 %
Eosinophils Absolute: 0.2 10*3/uL (ref 0.0–0.5)
Eosinophils Relative: 4 %
HCT: 33.6 % — ABNORMAL LOW (ref 36.0–46.0)
Hemoglobin: 11.3 g/dL — ABNORMAL LOW (ref 12.0–15.0)
Immature Granulocytes: 0 %
Lymphocytes Relative: 36 %
Lymphs Abs: 1.8 10*3/uL (ref 0.7–4.0)
MCH: 29.7 pg (ref 26.0–34.0)
MCHC: 33.6 g/dL (ref 30.0–36.0)
MCV: 88.4 fL (ref 80.0–100.0)
Monocytes Absolute: 0.6 10*3/uL (ref 0.1–1.0)
Monocytes Relative: 11 %
NEUTROS ABS: 2.5 10*3/uL (ref 1.7–7.7)
Neutrophils Relative %: 49 %
Platelet Count: 142 10*3/uL — ABNORMAL LOW (ref 150–400)
RBC: 3.8 MIL/uL — ABNORMAL LOW (ref 3.87–5.11)
RDW: 13.5 % (ref 11.5–15.5)
WBC Count: 5.1 10*3/uL (ref 4.0–10.5)
nRBC: 0 % (ref 0.0–0.2)

## 2019-01-06 MED ORDER — SODIUM CHLORIDE 0.9 % IV SOLN
Freq: Once | INTRAVENOUS | Status: AC
  Administered 2019-01-06: 10:00:00 via INTRAVENOUS
  Filled 2019-01-06: qty 250

## 2019-01-06 MED ORDER — HEPARIN SOD (PORK) LOCK FLUSH 100 UNIT/ML IV SOLN
500.0000 [IU] | Freq: Once | INTRAVENOUS | Status: AC | PRN
  Administered 2019-01-06: 500 [IU]
  Filled 2019-01-06: qty 5

## 2019-01-06 MED ORDER — SODIUM CHLORIDE 0.9 % IV SOLN
1200.0000 mg | Freq: Once | INTRAVENOUS | Status: AC
  Administered 2019-01-06: 1200 mg via INTRAVENOUS
  Filled 2019-01-06: qty 20

## 2019-01-06 MED ORDER — DEMECLOCYCLINE HCL 150 MG PO TABS: 150.0000 mg | ORAL_TABLET | Freq: Two times a day (BID) | ORAL | 1 refills | Status: DC

## 2019-01-06 MED ORDER — SODIUM CHLORIDE 0.9% FLUSH
10.0000 mL | INTRAVENOUS | Status: DC | PRN
  Administered 2019-01-06: 10 mL
  Filled 2019-01-06: qty 10

## 2019-01-06 NOTE — Telephone Encounter (Signed)
Scheduled appt per 2/17 los - pt to get  An updated schedule next visit.

## 2019-01-06 NOTE — Patient Instructions (Signed)
[facility] Discharge Instructions for Patients Receiving Chemotherapy  Today you received the following chemotherapy agents: Tecentriq  To help prevent nausea and vomiting after your treatment, we encourage you to take your nausea medication as directed.   If you develop nausea and vomiting that is not controlled by your nausea medication, call the clinic.   BELOW ARE SYMPTOMS THAT SHOULD BE REPORTED IMMEDIATELY:  *FEVER GREATER THAN 100.5 F  *CHILLS WITH OR WITHOUT FEVER  NAUSEA AND VOMITING THAT IS NOT CONTROLLED WITH YOUR NAUSEA MEDICATION  *UNUSUAL SHORTNESS OF BREATH  *UNUSUAL BRUISING OR BLEEDING  TENDERNESS IN MOUTH AND THROAT WITH OR WITHOUT PRESENCE OF ULCERS  *URINARY PROBLEMS  *BOWEL PROBLEMS  UNUSUAL RASH Items with * indicate a potential emergency and should be followed up as soon as possible.  Feel free to call the clinic should you have any questions or concerns. The clinic phone number is [phone].  Please show the CHEMO ALERT CARD at check-in to the Emergency Department and triage nurse.   

## 2019-01-06 NOTE — Progress Notes (Signed)
[facility] [phone]   [phone]  OFFICE PROGRESS NOTE  [name], MD [place] [address]  DIAGNOSIS: Extensive stage (T3, N2, M1a)  small cell lung cancer diagnosed in August 2019 and presented with large left hilar/subhilar mass in addition to mediastinal lymphadenopathy and contracture and mass in the right upper lobe.  PRIOR THERAPY:None  CURRENT THERAPY: Therapy with carboplatin for AUC of 5 on day 1, etoposide 100 mg/M2 on days 1, 2 and 3 as well as Tecentriq (Atezolizumab) 1200 mg IV every 3 weeks with Neulasta support.  Status post 8 cycles.  Starting from cycle #7, the patient will be treated with only single agent immunotherapy with Tecentriq 1200 mg IV every 3 weeks.  INTERVAL HISTORY: [name] 63 y.o. female returns to the clinic today for follow-up visit accompanied by her husband.  The patient is feeling fine today with no concerning complaints.  She continues to tolerate her maintenance treatment with Tecentriq fairly well.  She denied having any chest pain, shortness of breath, cough or hemoptysis.  She denied having any fever or chills.  She has no nausea, vomiting, diarrhea or constipation.  She is here today for evaluation before starting cycle #3 of her maintenance treatment with Tecentriq.  MEDICAL HISTORY: Past Medical History:  Diagnosis Date  . Alpha-1-antitrypsin deficiency ([place])   . Cervical cancer ([place]) 2004  . 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 This doesn't fit entirely with emphysema.      . Exposure to hepatitis C 09/12/2016  . Fibromyalgia   . GERD (gastroesophageal reflux disease)   . Hyperlipidemia   . IBS (irritable bowel syndrome)   . Obstructive sleep apnea 06/02/2008   NPSG 06/07/08, AHI 6.9/ hr, weight  220 lbs    . Osteoporosis   . Pulmonary fibrosis, unspecified ([place]) 09/29/2016   CXR 03/28/2016  . Rhinitis, nonallergic 05/08/2012  . 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 Vitamin D deficiency     ALLERGIES:  is allergic to penicillins; actonel [risedronate sodium]; advair diskus [fluticasone-salmeterol]; alendronate sodium; aspirin; azithromycin; keflex [cephalexin]; levofloxacin; morphine; nsaids; omnicef [cefdinir]; and erythromycin.  MEDICATIONS:  Current Outpatient Medications  Medication Sig Dispense Refill  . albuterol (PROVENTIL) (2.5 MG/3ML) 0.083% nebulizer solution Take 3 mLs (2.5 mg total) by nebulization every 6 (six) hours as needed. 90 mL 4  . allopurinol (ZYLOPRIM) 300 MG tablet Take 1 tablet (300 mg total) by mouth daily. Begin on 07/25/2018 14 tablet 0  . amitriptyline (ELAVIL) 75 MG tablet Take 75 mg by mouth at bedtime.      Marland Kitchen ezetimibe (ZETIA) 10 MG tablet TAKE 1 TABLET ONCE A DAY 90 tablet 0  . feeding supplement, ENSURE ENLIVE, (ENSURE ENLIVE) LIQD Take 237 mLs by mouth 2 (two) times daily between meals. 60 Bottle 0  . fluticasone (FLONASE) 50 MCG/ACT nasal spray USE 2 SPRAYS IN EACH NOSTRIL ONCE DAILY. 16 g 5  . folic acid (FOLVITE) 1 MG tablet Take 1 mg by mouth daily.    . Glycopyrrolate-Formoterol (BEVESPI AEROSPHERE) 9-4.8 MCG/ACT AERO Inhale 2 puffs into the lungs 2 (two) times daily. 1 Inhaler 12  . HYDROcodone-acetaminophen (NORCO/VICODIN) 5-325 MG tablet Take 1 tablet by mouth 3 (three) times daily. 30 tablet 0  . levothyroxine (  SYNTHROID) 50 MCG tablet Take 1 tablet (50 mcg total) by mouth daily before breakfast. 30 tablet 1  . lidocaine-prilocaine (EMLA) cream Apply 1 application topically as needed. 30 g 0  . Nebulizers (COMPRESSOR NEBULIZER) MISC 1 Device by Does not apply route as needed. 1 each prn  . omeprazole (PRILOSEC) 40 MG capsule TAKE (1) CAPSULE DAILY 90 capsule 0  . PROAIR HFA 108 (90 Base) MCG/ACT inhaler 2  PUFFS EVERY 4 HOURS AS NEEDED FOR WHEEZING 8.5 g 0  . rosuvastatin (CRESTOR) 40 MG tablet Take 0.5 tablets (20 mg total) by mouth daily. 45 tablet 3  . benzonatate (TESSALON) 100 MG capsule Take 1 capsule (100 mg total) by mouth 3 (three) times daily as needed for cough. (Patient not taking: Reported on 01/06/2019) 20 capsule 0  . prochlorperazine (COMPAZINE) 10 MG tablet Take 1 tablet (10 mg total) by mouth every 6 (six) hours as needed for nausea or vomiting. (Patient not taking: Reported on 01/06/2019) 30 tablet 0   No current facility-administered medications for this visit.     SURGICAL HISTORY:  Past Surgical History:  Procedure Laterality Date  . ABDOMINAL HYSTERECTOMY    . IR IMAGING GUIDED PORT INSERTION  08/02/2018  . LUMBAR DISC SURGERY    . VIDEO BRONCHOSCOPY Bilateral 07/10/2018   Procedure: VIDEO BRONCHOSCOPY WITHOUT FLUORO;  Surgeon: Laurin Coder, MD;  Location: [facility];  Service: Cardiopulmonary;  Laterality: Bilateral;    REVIEW OF SYSTEMS:  A comprehensive review of systems was negative.   PHYSICAL EXAMINATION: General appearance: alert, cooperative and no distress Head: Normocephalic, without obvious abnormality, atraumatic Neck: no adenopathy, no JVD, supple, symmetrical, trachea midline and thyroid not enlarged, symmetric, no tenderness/mass/nodules Lymph nodes: Cervical, supraclavicular, and axillary nodes normal. Resp: clear to auscultation bilaterally Back: symmetric, no curvature. ROM normal. No CVA tenderness. Cardio: regular rate and rhythm, S1, S2 normal, no murmur, click, rub or gallop GI: soft, non-tender; bowel sounds normal; no masses,  no organomegaly Extremities: extremities normal, atraumatic, no cyanosis or edema  ECOG PERFORMANCE STATUS: 1 - Symptomatic but completely ambulatory  Blood pressure (!) 155/78, pulse 84, temperature 98.1 F (36.7 C), temperature source Oral, resp. rate 18, height 5\' 3"  (1.6 m), weight 201 lb 12.8 oz (91.5 kg),  SpO2 93 %.  LABORATORY DATA: Lab Results  Component Value Date   WBC 5.1 01/06/2019   HGB 11.3 (L) 01/06/2019   HCT 33.6 (L) 01/06/2019   MCV 88.4 01/06/2019   PLT 142 (L) 01/06/2019      Chemistry      Component Value Date/Time   NA 139 12/16/2018 1106   NA 124 (L) 07/16/2018 0853   K 4.1 12/16/2018 1106   CL 100 12/16/2018 1106   CO2 31 12/16/2018 1106   BUN 10 12/16/2018 1106   BUN 8 07/16/2018 0853   CREATININE 0.72 12/16/2018 1106   CREATININE 0.68 02/13/2013 1523      Component Value Date/Time   CALCIUM 9.5 12/16/2018 1106   ALKPHOS 208 (H) 12/16/2018 1106   AST 37 12/16/2018 1106   ALT 21 12/16/2018 1106   BILITOT 0.4 12/16/2018 1106       RADIOGRAPHIC STUDIES: Ct Chest W Contrast  Result Date: 12/13/2018 CLINICAL DATA:  Small-cell lung cancer, ongoing immunotherapy. Lower abdominal pain for 1 month. History of cervical cancer. EXAM: CT CHEST, ABDOMEN, AND PELVIS WITH CONTRAST TECHNIQUE: Multidetector CT imaging of the chest, abdomen and pelvis was performed following the standard protocol during bolus administration of intravenous contrast.  CONTRAST:  121mL OMNIPAQUE IOHEXOL 300 MG/ML  SOLN COMPARISON:  10/11/2018 FINDINGS: CT CHEST FINDINGS Cardiovascular: Right Port-A-Cath tip: Right atrium. Coronary, aortic arch, and branch vessel atherosclerotic vascular disease. Mediastinum/Nodes: AP window lymph node 1.1 cm in short axis on image 23/2, stable. Left infrahilar node 0.8 cm in short axis on image 29/2 stable. Other upper normal sized mediastinal lymph nodes likewise appear stable. Lungs/Pleura: As before, the prior left infrahilar mass manifests primarily as infrahilar peribronchovascular nodularity resembling small lymph nodes, and is unchanged from the 10/11/2018 exam. Centrilobular emphysema with stable accentuated peripheral interstitium and mild mosaic attenuation. Stable scarring in the lung bases with some additional posterior basal segment atelectasis in the  right lower lobe. Adjacent to this atelectasis and on image 97/6, there is some new right lower lobe nodularity, manifesting as 2 adjacent 0.7 cm nodules on image 97/6. Wall possibly from rounded atelectasis given the adjacent bandlike appearance, surveillance of these nodules is indicated. There is a suggestion of a 5 mm nodule in the left lower lobe on image 102/6 which is not well seen previously and only visible long a bandlike density. Airway thickening is present especially involving the lower lobes. Musculoskeletal: Indistinct sclerotic lesions at T3, T5, and T10 are similar to the prior exam. There also indistinct sclerotic lesions at T7 and T12 which are stable. Stable appearance of left lateral ninth rib fracture with late phase healing response. CT ABDOMEN PELVIS FINDINGS Hepatobiliary: Cirrhosis. Stable faint sub-centimeter hypodensities the right and left hepatic lobes. Gallbladder unremarkable. No biliary dilatation. Pancreas: Unremarkable Spleen: Unremarkable Adrenals/Urinary Tract: Mildly asymmetric thickening the right inferior wall of the urinary bladder for example on image 74/4, significance uncertain. Stomach/Bowel: Unremarkable Vascular/Lymphatic: Aortoiliac atherosclerotic vascular disease. Reactive lymph nodes in the porta hepatis likely related to the patient's cirrhosis. Prior pelvic lymph node dissection. A right external iliac node measures 0.9 cm in short axis on image 122/2, formerly 1.0 cm. Reproductive: Uterus absent.  Adnexa unremarkable. Other: No supplemental non-categorized findings. Musculoskeletal: 2.4 by 1.7 cm sclerotic lesion along the iliac side of the right SI joint, stable from 10/11/2018. Stable sclerotic lesion inferiorly in the L3 vertebral body. Indistinct sclerotic lesions at L5. Interbody cages at L4-5. 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 IMPRESSION: 1. Overall stable appearance of mild adenopathy in the chest (especially the AP window lymph node  measuring 1.1 cm in short axis) and scattered sclerotic bony lesions. 2. Increased atelectasis in the right lower lobe associated with 2 adjacent 7 mm nodules along the area of atelectasis. This could be from rounded atelectasis but malignancy is not excluded and attention to the right lower lobe on follow up exams is recommended. 3. Mildly asymmetric thickening of the right urinary bladder. This could be related to prior inflammation/therapy. Strictly speaking, urothelial tumor is not totally excluded. Cystoscopy could be performed if clinically warranted, particularly if the patient has hematuria. 4. Other imaging findings of potential clinical significance: Aortic Atherosclerosis (ICD10-I70.0) and Emphysema (ICD10-J43.9). Coronary atherosclerosis. Airway thickening is present, suggesting bronchitis or reactive airways disease. 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 Hepatic cirrhosis with several small hypodense lesions in the liver which are stable and likely benign. Electronically Signed   By: Van Clines M.D.   On: 12/13/2018 15:39   Ct Abdomen Pelvis W Contrast  Result Date: 12/13/2018 CLINICAL DATA:  Small-cell lung cancer, ongoing immunotherapy. Lower abdominal pain for 1 month. History of cervical cancer. EXAM: CT CHEST, ABDOMEN, AND PELVIS WITH CONTRAST TECHNIQUE: Multidetector CT imaging  of the chest, abdomen and pelvis was performed following the standard protocol during bolus administration of intravenous contrast. CONTRAST:  189mL OMNIPAQUE IOHEXOL 300 MG/ML  SOLN COMPARISON:  10/11/2018 FINDINGS: CT CHEST FINDINGS Cardiovascular: Right Port-A-Cath tip: Right atrium. Coronary, aortic arch, and branch vessel atherosclerotic vascular disease. Mediastinum/Nodes: AP window lymph node 1.1 cm in short axis on image 23/2, stable. Left infrahilar node 0.8 cm in short axis on image 29/2 stable. Other upper normal sized mediastinal lymph nodes likewise appear stable. Lungs/Pleura: As before, the prior  left infrahilar mass manifests primarily as infrahilar peribronchovascular nodularity resembling small lymph nodes, and is unchanged from the 10/11/2018 exam. Centrilobular emphysema with stable accentuated peripheral interstitium and mild mosaic attenuation. Stable scarring in the lung bases with some additional posterior basal segment atelectasis in the right lower lobe. Adjacent to this atelectasis and on image 97/6, there is some new right lower lobe nodularity, manifesting as 2 adjacent 0.7 cm nodules on image 97/6. Wall possibly from rounded atelectasis given the adjacent bandlike appearance, surveillance of these nodules is indicated. There is a suggestion of a 5 mm nodule in the left lower lobe on image 102/6 which is not well seen previously and only visible long a bandlike density. Airway thickening is present especially involving the lower lobes. Musculoskeletal: Indistinct sclerotic lesions at T3, T5, and T10 are similar to the prior exam. There also indistinct sclerotic lesions at T7 and T12 which are stable. Stable appearance of left lateral ninth rib fracture with late phase healing response. CT ABDOMEN PELVIS FINDINGS Hepatobiliary: Cirrhosis. Stable faint sub-centimeter hypodensities the right and left hepatic lobes. Gallbladder unremarkable. No biliary dilatation. Pancreas: Unremarkable Spleen: Unremarkable Adrenals/Urinary Tract: Mildly asymmetric thickening the right inferior wall of the urinary bladder for example on image 74/4, significance uncertain. Stomach/Bowel: Unremarkable Vascular/Lymphatic: Aortoiliac atherosclerotic vascular disease. Reactive lymph nodes in the porta hepatis likely related to the patient's cirrhosis. Prior pelvic lymph node dissection. A right external iliac node measures 0.9 cm in short axis on image 122/2, formerly 1.0 cm. Reproductive: Uterus absent.  Adnexa unremarkable. Other: No supplemental non-categorized findings. Musculoskeletal: 2.4 by 1.7 cm sclerotic  lesion along the iliac side of the right SI joint, stable from 10/11/2018. Stable sclerotic lesion inferiorly in the L3 vertebral body. Indistinct sclerotic lesions at L5. Interbody cages at L4-5. 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 IMPRESSION: 1. Overall stable appearance of mild adenopathy in the chest (especially the AP window lymph node measuring 1.1 cm in short axis) and scattered sclerotic bony lesions. 2. Increased atelectasis in the right lower lobe associated with 2 adjacent 7 mm nodules along the area of atelectasis. This could be from rounded atelectasis but malignancy is not excluded and attention to the right lower lobe on follow up exams is recommended. 3. Mildly asymmetric thickening of the right urinary bladder. This could be related to prior inflammation/therapy. Strictly speaking, urothelial tumor is not totally excluded. Cystoscopy could be performed if clinically warranted, particularly if the patient has hematuria. 4. Other imaging findings of potential clinical significance: Aortic Atherosclerosis (ICD10-I70.0) and Emphysema (ICD10-J43.9). Coronary atherosclerosis. Airway thickening is present, suggesting bronchitis or reactive airways disease. 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 Hepatic cirrhosis with several small hypodense lesions in the liver which are stable and likely benign. Electronically Signed   By: Van Clines M.D.   On: 12/13/2018 15:39    ASSESSMENT AND PLAN: This is a very pleasant 63 years old white female recently diagnosed with extensive stage small cell  lung cancer.  The patient had a recent PET scan that showed multiple metastatic disease in the lung, liver as well as bone. The patient is currently on systemic chemotherapy with carboplatin, etoposide and Tecentriq status post 6 cycles. She tolerated the induction phase of her treatment well with no concerning complaints. The patient was started on maintenance treatment with single agent  Tecentriq status post 2 cycles. She is tolerating this treatment well with no concerning adverse effects. I recommended for her to proceed with cycle #3 today. I will see her back for follow-up visit in 3 weeks for evaluation before the next cycle of her treatment. For hypertension she will continue with the current blood pressure medication and monitor it closely at home. For the hyponatremia, will advise the patient with fluid restriction as well as starting her on treatment with demeclocycline 150 mg p.o. twice daily.  I will also arrange for the patient to have repeat blood work next week. The patient was advised to call immediately if she has any concerning symptoms in the interval. The patient voices understanding of current disease status and treatment options and is in agreement with the current care plan. All questions were answered. The patient knows to call the clinic with any problems, questions or concerns. We can certainly see the patient much sooner if necessary.  Disclaimer: This note was dictated with voice recognition software. Similar sounding words can inadvertently be transcribed and may not be corrected upon review.

## 2019-01-08 LAB — TSH: TSH: 28.031 u[IU]/mL — AB (ref 0.308–3.960)

## 2019-01-09 DIAGNOSIS — N390 Urinary tract infection, site not specified: Secondary | ICD-10-CM | POA: Diagnosis not present

## 2019-01-09 DIAGNOSIS — B951 Streptococcus, group B, as the cause of diseases classified elsewhere: Secondary | ICD-10-CM | POA: Diagnosis not present

## 2019-01-13 ENCOUNTER — Other Ambulatory Visit: Payer: Self-pay | Admitting: Medical Oncology

## 2019-01-13 ENCOUNTER — Inpatient Hospital Stay: Payer: Medicare Other

## 2019-01-13 DIAGNOSIS — K219 Gastro-esophageal reflux disease without esophagitis: Secondary | ICD-10-CM | POA: Diagnosis not present

## 2019-01-13 DIAGNOSIS — M797 Fibromyalgia: Secondary | ICD-10-CM | POA: Diagnosis not present

## 2019-01-13 DIAGNOSIS — C3492 Malignant neoplasm of unspecified part of left bronchus or lung: Secondary | ICD-10-CM

## 2019-01-13 DIAGNOSIS — C7800 Secondary malignant neoplasm of unspecified lung: Secondary | ICD-10-CM | POA: Diagnosis not present

## 2019-01-13 DIAGNOSIS — K589 Irritable bowel syndrome without diarrhea: Secondary | ICD-10-CM | POA: Diagnosis not present

## 2019-01-13 DIAGNOSIS — Z5112 Encounter for antineoplastic immunotherapy: Secondary | ICD-10-CM | POA: Diagnosis not present

## 2019-01-13 DIAGNOSIS — C3412 Malignant neoplasm of upper lobe, left bronchus or lung: Secondary | ICD-10-CM | POA: Diagnosis not present

## 2019-01-13 DIAGNOSIS — Z87891 Personal history of nicotine dependence: Secondary | ICD-10-CM | POA: Diagnosis not present

## 2019-01-13 DIAGNOSIS — J449 Chronic obstructive pulmonary disease, unspecified: Secondary | ICD-10-CM | POA: Diagnosis not present

## 2019-01-13 DIAGNOSIS — J8410 Pulmonary fibrosis, unspecified: Secondary | ICD-10-CM | POA: Diagnosis not present

## 2019-01-13 DIAGNOSIS — I10 Essential (primary) hypertension: Secondary | ICD-10-CM | POA: Diagnosis not present

## 2019-01-13 DIAGNOSIS — C7951 Secondary malignant neoplasm of bone: Secondary | ICD-10-CM | POA: Diagnosis not present

## 2019-01-13 DIAGNOSIS — E559 Vitamin D deficiency, unspecified: Secondary | ICD-10-CM | POA: Diagnosis not present

## 2019-01-13 DIAGNOSIS — Z79899 Other long term (current) drug therapy: Secondary | ICD-10-CM | POA: Diagnosis not present

## 2019-01-13 DIAGNOSIS — C787 Secondary malignant neoplasm of liver and intrahepatic bile duct: Secondary | ICD-10-CM | POA: Diagnosis not present

## 2019-01-13 DIAGNOSIS — E785 Hyperlipidemia, unspecified: Secondary | ICD-10-CM | POA: Diagnosis not present

## 2019-01-13 LAB — CMP (CANCER CENTER ONLY)
ALT: 30 U/L (ref 0–44)
AST: 41 U/L (ref 15–41)
Albumin: 4.1 g/dL (ref 3.5–5.0)
Alkaline Phosphatase: 197 U/L — ABNORMAL HIGH (ref 38–126)
Anion gap: 9 (ref 5–15)
BUN: 12 mg/dL (ref 6–20)
CO2: 31 mmol/L (ref 22–32)
CREATININE: 0.69 mg/dL (ref 0.44–1.00)
Calcium: 9.4 mg/dL (ref 8.9–10.3)
Chloride: 92 mmol/L — ABNORMAL LOW (ref 98–111)
GFR, Est AFR Am: >60 mL/min (ref >60)
GFR, Estimated: >60 mL/min (ref >60)
Glucose, Bld: 51 mg/dL — ABNORMAL LOW (ref 70–99)
Potassium: 3.7 mmol/L (ref 3.5–5.1)
Sodium: 132 mmol/L — ABNORMAL LOW (ref 135–145)
Total Bilirubin: 0.3 mg/dL (ref 0.3–1.2)
Total Protein: 8.3 g/dL — ABNORMAL HIGH (ref 6.5–8.1)

## 2019-01-13 LAB — CBC WITH DIFFERENTIAL (CANCER CENTER ONLY)
Abs Immature Granulocytes: 0.01 10*3/uL (ref 0.00–0.07)
Basophils Absolute: 0.1 10*3/uL (ref 0.0–0.1)
Basophils Relative: 1 %
Eosinophils Absolute: 0.2 10*3/uL (ref 0.0–0.5)
Eosinophils Relative: 2 %
HEMATOCRIT: 37.5 % (ref 36.0–46.0)
Hemoglobin: 11.9 g/dL — ABNORMAL LOW (ref 12.0–15.0)
Immature Granulocytes: 0 %
Lymphocytes Relative: 41 %
Lymphs Abs: 3 10*3/uL (ref 0.7–4.0)
MCH: 29.8 pg (ref 26.0–34.0)
MCHC: 31.7 g/dL (ref 30.0–36.0)
MCV: 94 fL (ref 80.0–100.0)
MONO ABS: 0.7 10*3/uL (ref 0.1–1.0)
MONOS PCT: 9 %
Neutro Abs: 3.5 10*3/uL (ref 1.7–7.7)
Neutrophils Relative %: 47 %
Platelet Count: 129 10*3/uL — ABNORMAL LOW (ref 150–400)
RBC: 3.99 MIL/uL (ref 3.87–5.11)
RDW: 14.6 % (ref 11.5–15.5)
WBC Count: 7.5 10*3/uL (ref 4.0–10.5)
nRBC: 0 % (ref 0.0–0.2)

## 2019-01-18 DIAGNOSIS — C3492 Malignant neoplasm of unspecified part of left bronchus or lung: Secondary | ICD-10-CM | POA: Diagnosis not present

## 2019-01-20 DIAGNOSIS — N329 Bladder disorder, unspecified: Secondary | ICD-10-CM | POA: Diagnosis not present

## 2019-01-20 DIAGNOSIS — N390 Urinary tract infection, site not specified: Secondary | ICD-10-CM | POA: Diagnosis not present

## 2019-01-22 ENCOUNTER — Other Ambulatory Visit: Payer: Self-pay | Admitting: Internal Medicine

## 2019-01-24 ENCOUNTER — Telehealth: Payer: Self-pay | Admitting: Family Medicine

## 2019-01-24 ENCOUNTER — Other Ambulatory Visit: Payer: Self-pay | Admitting: Internal Medicine

## 2019-01-24 NOTE — Telephone Encounter (Signed)
Pt called and appt moved to 02-19-19

## 2019-01-24 NOTE — Telephone Encounter (Signed)
PT has an apt on Monday with Dr Laurance Flatten, Spouse wants to know if he can bring the pt through the back door since she has lung cancer and the flu is bad, wants to talk to [facility] about it.

## 2019-01-24 NOTE — Telephone Encounter (Signed)
NA - no VM

## 2019-01-27 ENCOUNTER — Encounter: Payer: Self-pay | Admitting: Internal Medicine

## 2019-01-27 ENCOUNTER — Inpatient Hospital Stay: Payer: Medicare Other | Admitting: Internal Medicine

## 2019-01-27 ENCOUNTER — Ambulatory Visit: Payer: Medicare Other | Admitting: Family Medicine

## 2019-01-27 ENCOUNTER — Inpatient Hospital Stay: Payer: Medicare Other | Attending: Internal Medicine

## 2019-01-27 ENCOUNTER — Inpatient Hospital Stay: Payer: Medicare Other

## 2019-01-27 VITALS — BP 160/90 | HR 87 | Temp 98.2°F | Resp 20 | Ht 63.0 in | Wt 194.1 lb

## 2019-01-27 DIAGNOSIS — C3412 Malignant neoplasm of upper lobe, left bronchus or lung: Secondary | ICD-10-CM

## 2019-01-27 DIAGNOSIS — R5382 Chronic fatigue, unspecified: Secondary | ICD-10-CM

## 2019-01-27 DIAGNOSIS — M797 Fibromyalgia: Secondary | ICD-10-CM | POA: Diagnosis not present

## 2019-01-27 DIAGNOSIS — C7800 Secondary malignant neoplasm of unspecified lung: Secondary | ICD-10-CM | POA: Insufficient documentation

## 2019-01-27 DIAGNOSIS — Z5112 Encounter for antineoplastic immunotherapy: Secondary | ICD-10-CM | POA: Diagnosis not present

## 2019-01-27 DIAGNOSIS — E871 Hypo-osmolality and hyponatremia: Secondary | ICD-10-CM | POA: Insufficient documentation

## 2019-01-27 DIAGNOSIS — Z87891 Personal history of nicotine dependence: Secondary | ICD-10-CM | POA: Diagnosis not present

## 2019-01-27 DIAGNOSIS — Z79899 Other long term (current) drug therapy: Secondary | ICD-10-CM | POA: Insufficient documentation

## 2019-01-27 DIAGNOSIS — C3492 Malignant neoplasm of unspecified part of left bronchus or lung: Secondary | ICD-10-CM

## 2019-01-27 DIAGNOSIS — E876 Hypokalemia: Secondary | ICD-10-CM | POA: Diagnosis not present

## 2019-01-27 DIAGNOSIS — I10 Essential (primary) hypertension: Secondary | ICD-10-CM | POA: Insufficient documentation

## 2019-01-27 DIAGNOSIS — J8410 Pulmonary fibrosis, unspecified: Secondary | ICD-10-CM | POA: Diagnosis not present

## 2019-01-27 DIAGNOSIS — K219 Gastro-esophageal reflux disease without esophagitis: Secondary | ICD-10-CM | POA: Insufficient documentation

## 2019-01-27 DIAGNOSIS — C787 Secondary malignant neoplasm of liver and intrahepatic bile duct: Secondary | ICD-10-CM | POA: Insufficient documentation

## 2019-01-27 DIAGNOSIS — Z95828 Presence of other vascular implants and grafts: Secondary | ICD-10-CM

## 2019-01-27 DIAGNOSIS — J449 Chronic obstructive pulmonary disease, unspecified: Secondary | ICD-10-CM | POA: Diagnosis not present

## 2019-01-27 DIAGNOSIS — C7951 Secondary malignant neoplasm of bone: Secondary | ICD-10-CM | POA: Insufficient documentation

## 2019-01-27 DIAGNOSIS — E559 Vitamin D deficiency, unspecified: Secondary | ICD-10-CM | POA: Diagnosis not present

## 2019-01-27 DIAGNOSIS — Z8541 Personal history of malignant neoplasm of cervix uteri: Secondary | ICD-10-CM | POA: Insufficient documentation

## 2019-01-27 DIAGNOSIS — E785 Hyperlipidemia, unspecified: Secondary | ICD-10-CM | POA: Diagnosis not present

## 2019-01-27 DIAGNOSIS — K589 Irritable bowel syndrome without diarrhea: Secondary | ICD-10-CM | POA: Insufficient documentation

## 2019-01-27 LAB — CBC WITH DIFFERENTIAL (CANCER CENTER ONLY)
Abs Immature Granulocytes: 0 10*3/uL (ref 0.00–0.07)
Basophils Absolute: 0.1 10*3/uL (ref 0.0–0.1)
Basophils Relative: 1 %
Eosinophils Absolute: 0.1 10*3/uL (ref 0.0–0.5)
Eosinophils Relative: 3 %
HCT: 35.5 % — ABNORMAL LOW (ref 36.0–46.0)
Hemoglobin: 11.5 g/dL — ABNORMAL LOW (ref 12.0–15.0)
Immature Granulocytes: 0 %
Lymphocytes Relative: 50 %
Lymphs Abs: 2.5 10*3/uL (ref 0.7–4.0)
MCH: 29.6 pg (ref 26.0–34.0)
MCHC: 32.4 g/dL (ref 30.0–36.0)
MCV: 91.3 fL (ref 80.0–100.0)
Monocytes Absolute: 0.6 10*3/uL (ref 0.1–1.0)
Monocytes Relative: 12 %
NEUTROS PCT: 34 %
Neutro Abs: 1.7 10*3/uL (ref 1.7–7.7)
PLATELETS: 136 10*3/uL — AB (ref 150–400)
RBC: 3.89 MIL/uL (ref 3.87–5.11)
RDW: 15.2 % (ref 11.5–15.5)
WBC Count: 4.9 10*3/uL (ref 4.0–10.5)
nRBC: 0 % (ref 0.0–0.2)

## 2019-01-27 LAB — CMP (CANCER CENTER ONLY)
ALT: 23 U/L (ref 0–44)
ANION GAP: 10 (ref 5–15)
AST: 38 U/L (ref 15–41)
Albumin: 3.9 g/dL (ref 3.5–5.0)
Alkaline Phosphatase: 202 U/L — ABNORMAL HIGH (ref 38–126)
BUN: 7 mg/dL (ref 6–20)
CO2: 30 mmol/L (ref 22–32)
Calcium: 9.1 mg/dL (ref 8.9–10.3)
Chloride: 91 mmol/L — ABNORMAL LOW (ref 98–111)
Creatinine: 0.67 mg/dL (ref 0.44–1.00)
GFR, Estimated: >60 mL/min (ref >60)
Glucose, Bld: 80 mg/dL (ref 70–99)
Potassium: 3.3 mmol/L — ABNORMAL LOW (ref 3.5–5.1)
Sodium: 131 mmol/L — ABNORMAL LOW (ref 135–145)
Total Bilirubin: 0.5 mg/dL (ref 0.3–1.2)
Total Protein: 8.2 g/dL — ABNORMAL HIGH (ref 6.5–8.1)

## 2019-01-27 LAB — TSH: TSH: 37.66 u[IU]/mL — ABNORMAL HIGH (ref 0.308–3.960)

## 2019-01-27 MED ORDER — SODIUM CHLORIDE 0.9 % IV SOLN
Freq: Once | INTRAVENOUS | Status: AC
  Administered 2019-01-27: 15:00:00 via INTRAVENOUS
  Filled 2019-01-27: qty 250

## 2019-01-27 MED ORDER — HEPARIN SOD (PORK) LOCK FLUSH 100 UNIT/ML IV SOLN
500.0000 [IU] | Freq: Once | INTRAVENOUS | Status: DC | PRN
  Filled 2019-01-27: qty 5

## 2019-01-27 MED ORDER — SODIUM CHLORIDE 0.9 % IV SOLN
1200.0000 mg | Freq: Once | INTRAVENOUS | Status: AC
  Administered 2019-01-27: 1200 mg via INTRAVENOUS
  Filled 2019-01-27: qty 20

## 2019-01-27 MED ORDER — SODIUM CHLORIDE 0.9% FLUSH
10.0000 mL | INTRAVENOUS | Status: DC | PRN
  Administered 2019-01-27: 10 mL
  Filled 2019-01-27: qty 10

## 2019-01-27 MED ORDER — SODIUM CHLORIDE 0.9% FLUSH
10.0000 mL | INTRAVENOUS | Status: DC | PRN
  Filled 2019-01-27: qty 10

## 2019-01-27 NOTE — Progress Notes (Signed)
[facility] [phone]   [phone]  OFFICE PROGRESS NOTE  [name], MD [place] [address]  DIAGNOSIS: Extensive stage (T3, N2, M1a)  small cell lung cancer diagnosed in August 2019 and presented with large left hilar/subhilar mass in addition to mediastinal lymphadenopathy and contracture and mass in the right upper lobe.  PRIOR THERAPY:None  CURRENT THERAPY: Therapy with carboplatin for AUC of 5 on day 1, etoposide 100 mg/M2 on days 1, 2 and 3 as well as Tecentriq (Atezolizumab) 1200 mg IV every 3 weeks with Neulasta support.  Status post 9 cycles.  Starting from cycle #7, the patient will be treated with only single agent immunotherapy with Tecentriq 1200 mg IV every 3 weeks.  INTERVAL HISTORY: [name] 63 y.o. female returns to the clinic today for follow-up visit accompanied by her husband.  The patient is feeling fine today with no concerning complaints except for mild fatigue.  She was recently treated for urinary tract infection by her urologist.  She denied having any chest pain, shortness of breath, cough or hemoptysis.  She denied having any fever or chills.  She has no nausea, vomiting, diarrhea or constipation.  She has no headache or visual changes.  She is here today for evaluation before starting cycle #10 of her treatment.  MEDICAL HISTORY: Past Medical History:  Diagnosis Date  . Alpha-1-antitrypsin deficiency ([place])   . Cervical cancer ([place]) 2004  . 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 This doesn't fit entirely with emphysema.      . Exposure to hepatitis C 09/12/2016  . Fibromyalgia   . GERD (gastroesophageal reflux disease)   . Hyperlipidemia   . IBS (irritable bowel syndrome)   . Obstructive sleep apnea 06/02/2008   NPSG  06/07/08, AHI 6.9/ hr, weight 220 lbs    . Osteoporosis   . Pulmonary fibrosis, unspecified ([place]) 09/29/2016   CXR 03/28/2016  . Rhinitis, nonallergic 05/08/2012  . 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 Vitamin D deficiency     ALLERGIES:  is allergic to penicillins; actonel [risedronate sodium]; advair diskus [fluticasone-salmeterol]; alendronate sodium; aspirin; azithromycin; keflex [cephalexin]; levofloxacin; morphine; nsaids; omnicef [cefdinir]; and erythromycin.  MEDICATIONS:  Current Outpatient Medications  Medication Sig Dispense Refill  . albuterol (PROVENTIL) (2.5 MG/3ML) 0.083% nebulizer solution Take 3 mLs (2.5 mg total) by nebulization every 6 (six) hours as needed. 90 mL 4  . allopurinol (ZYLOPRIM) 300 MG tablet Take 1 tablet (300 mg total) by mouth daily. Begin on 07/25/2018 14 tablet 0  . amitriptyline (ELAVIL) 75 MG tablet Take 75 mg by mouth at bedtime.      . benzonatate (TESSALON) 100 MG capsule Take 1 capsule (100 mg total) by mouth 3 (three) times daily as needed for cough. (Patient not taking: Reported on 01/06/2019) 20 capsule 0  . demeclocycline (DECLOMYCIN) 150 MG tablet Take 1 tablet (150 mg total) by mouth 2 (two) times daily. 60 tablet 1  . ezetimibe (ZETIA) 10 MG tablet TAKE 1 TABLET ONCE A DAY 90 tablet 0  . feeding supplement, ENSURE ENLIVE, (ENSURE ENLIVE) LIQD Take 237 mLs by mouth 2 (two) times daily between meals. 60 Bottle 0  . fluticasone (FLONASE) 50 MCG/ACT nasal spray USE 2 SPRAYS IN EACH NOSTRIL ONCE  DAILY. 16 g 5  . folic acid (FOLVITE) 1 MG tablet Take 1 mg by mouth daily.    . Glycopyrrolate-Formoterol (BEVESPI AEROSPHERE) 9-4.8 MCG/ACT AERO Inhale 2 puffs into the lungs 2 (two) times daily. 1 Inhaler 12  . HYDROcodone-acetaminophen (NORCO/VICODIN) 5-325 MG tablet Take 1 tablet by mouth 3 (three) times daily. 30 tablet 0  . levothyroxine (SYNTHROID, LEVOTHROID) 50 MCG tablet TAKE (1) TABLET DAILY BE- FORE BREAKFAST.  30 tablet 0  . lidocaine-prilocaine (EMLA) cream Apply 1 application topically as needed. 30 g 0  . Nebulizers (COMPRESSOR NEBULIZER) MISC 1 Device by Does not apply route as needed. 1 each prn  . omeprazole (PRILOSEC) 40 MG capsule TAKE (1) CAPSULE DAILY 90 capsule 0  . PROAIR HFA 108 (90 Base) MCG/ACT inhaler 2 PUFFS EVERY 4 HOURS AS NEEDED FOR WHEEZING 8.5 g 0  . prochlorperazine (COMPAZINE) 10 MG tablet Take 1 tablet (10 mg total) by mouth every 6 (six) hours as needed for nausea or vomiting. (Patient not taking: Reported on 01/06/2019) 30 tablet 0  . rosuvastatin (CRESTOR) 40 MG tablet Take 0.5 tablets (20 mg total) by mouth daily. 45 tablet 3   No current facility-administered medications for this visit.     SURGICAL HISTORY:  Past Surgical History:  Procedure Laterality Date  . ABDOMINAL HYSTERECTOMY    . IR IMAGING GUIDED PORT INSERTION  08/02/2018  . LUMBAR DISC SURGERY    . VIDEO BRONCHOSCOPY Bilateral 07/10/2018   Procedure: VIDEO BRONCHOSCOPY WITHOUT FLUORO;  Surgeon: Laurin Coder, MD;  Location: [facility];  Service: Cardiopulmonary;  Laterality: Bilateral;    REVIEW OF SYSTEMS:  A comprehensive review of systems was negative except for: Constitutional: positive for fatigue   PHYSICAL EXAMINATION: General appearance: alert, cooperative and no distress Head: Normocephalic, without obvious abnormality, atraumatic Neck: no adenopathy, no JVD, supple, symmetrical, trachea midline and thyroid not enlarged, symmetric, no tenderness/mass/nodules Lymph nodes: Cervical, supraclavicular, and axillary nodes normal. Resp: clear to auscultation bilaterally Back: symmetric, no curvature. ROM normal. No CVA tenderness. Cardio: regular rate and rhythm, S1, S2 normal, no murmur, click, rub or gallop GI: soft, non-tender; bowel sounds normal; no masses,  no organomegaly Extremities: extremities normal, atraumatic, no cyanosis or edema  ECOG PERFORMANCE STATUS: 1 - Symptomatic but  completely ambulatory  Blood pressure (!) 160/90, pulse 87, temperature 98.2 F (36.8 C), temperature source Oral, resp. rate 20, height 5\' 3"  (1.6 m), weight 194 lb 1.6 oz (88 kg), SpO2 96 %.  LABORATORY DATA: Lab Results  Component Value Date   WBC 4.9 01/27/2019   HGB 11.5 (L) 01/27/2019   HCT 35.5 (L) 01/27/2019   MCV 91.3 01/27/2019   PLT 136 (L) 01/27/2019      Chemistry      Component Value Date/Time   NA 132 (L) 01/13/2019 1257   NA 124 (L) 07/16/2018 0853   K 3.7 01/13/2019 1257   CL 92 (L) 01/13/2019 1257   CO2 31 01/13/2019 1257   BUN 12 01/13/2019 1257   BUN 8 07/16/2018 0853   CREATININE 0.69 01/13/2019 1257   CREATININE 0.68 02/13/2013 1523      Component Value Date/Time   CALCIUM 9.4 01/13/2019 1257   ALKPHOS 197 (H) 01/13/2019 1257   AST 41 01/13/2019 1257   ALT 30 01/13/2019 1257   BILITOT 0.3 01/13/2019 1257       RADIOGRAPHIC STUDIES: No results found.  ASSESSMENT AND PLAN: This is a very pleasant 63 years old white female recently diagnosed with  extensive stage small cell lung cancer.  The patient had a recent PET scan that showed multiple metastatic disease in the lung, liver as well as bone. The patient is currently on systemic chemotherapy with carboplatin, etoposide and Tecentriq status post 6 cycles. She tolerated the induction phase of her treatment well with no concerning complaints. The patient was started on maintenance treatment with single agent Tecentriq status post 3 cycles. She continues to tolerate this treatment well with no concerning adverse effects. I recommended for her to proceed with cycle #4 of maintenance Tecentriq today as scheduled. I will see the patient back for follow-up visit in 3 weeks for evaluation after repeating CT scan of the chest, abdomen and pelvis for restaging of her disease. For hypertension she will continue with the current blood pressure medication and monitor it closely at home. For the hyponatremia,  she will continue on fluid restriction as well as treatment with demeclocycline 150 mg p.o. twice daily.  The patient was advised to call immediately if she has any concerning symptoms in the interval. The patient voices understanding of current disease status and treatment options and is in agreement with the current care plan. All questions were answered. The patient knows to call the clinic with any problems, questions or concerns. We can certainly see the patient much sooner if necessary.  Disclaimer: This note was dictated with voice recognition software. Similar sounding words can inadvertently be transcribed and may not be corrected upon review.

## 2019-01-28 ENCOUNTER — Telehealth: Payer: Self-pay | Admitting: Internal Medicine

## 2019-01-28 NOTE — Telephone Encounter (Signed)
Scheduled additional cycle per 3/09 los - pt to get an updated schedule next visit.

## 2019-01-30 DIAGNOSIS — M797 Fibromyalgia: Secondary | ICD-10-CM | POA: Diagnosis not present

## 2019-01-30 DIAGNOSIS — M5136 Other intervertebral disc degeneration, lumbar region: Secondary | ICD-10-CM | POA: Diagnosis not present

## 2019-01-30 DIAGNOSIS — M150 Primary generalized (osteo)arthritis: Secondary | ICD-10-CM | POA: Diagnosis not present

## 2019-02-06 ENCOUNTER — Ambulatory Visit: Payer: Medicare Other | Admitting: Internal Medicine

## 2019-02-10 ENCOUNTER — Other Ambulatory Visit: Payer: Self-pay | Admitting: Family Medicine

## 2019-02-14 ENCOUNTER — Other Ambulatory Visit: Payer: Self-pay

## 2019-02-14 ENCOUNTER — Ambulatory Visit (HOSPITAL_COMMUNITY)
Admission: RE | Admit: 2019-02-14 | Discharge: 2019-02-14 | Disposition: A | Discharge status: Home / Self Care | Payer: Medicare Other | Source: Ambulatory Visit | Attending: Internal Medicine | Admitting: Internal Medicine

## 2019-02-14 DIAGNOSIS — C3492 Malignant neoplasm of unspecified part of left bronchus or lung: Secondary | ICD-10-CM | POA: Diagnosis not present

## 2019-02-14 DIAGNOSIS — K769 Liver disease, unspecified: Secondary | ICD-10-CM | POA: Diagnosis not present

## 2019-02-14 DIAGNOSIS — M8588 Other specified disorders of bone density and structure, other site: Secondary | ICD-10-CM | POA: Diagnosis not present

## 2019-02-14 DIAGNOSIS — R590 Localized enlarged lymph nodes: Secondary | ICD-10-CM | POA: Diagnosis not present

## 2019-02-14 DIAGNOSIS — K5790 Diverticulosis of intestine, part unspecified, without perforation or abscess without bleeding: Secondary | ICD-10-CM | POA: Diagnosis not present

## 2019-02-14 MED ORDER — IOHEXOL 300 MG/ML  SOLN
100.0000 mL | Freq: Once | INTRAMUSCULAR | Status: AC | PRN
  Administered 2019-02-14: 100 mL via INTRAVENOUS

## 2019-02-14 MED ORDER — SODIUM CHLORIDE (PF) 0.9 % IJ SOLN
INTRAMUSCULAR | Status: AC
  Filled 2019-02-14: qty 50

## 2019-02-17 ENCOUNTER — Encounter: Payer: Self-pay | Admitting: Internal Medicine

## 2019-02-17 ENCOUNTER — Inpatient Hospital Stay: Payer: Medicare Other

## 2019-02-17 ENCOUNTER — Other Ambulatory Visit: Payer: Self-pay

## 2019-02-17 ENCOUNTER — Inpatient Hospital Stay: Payer: Medicare Other | Admitting: Internal Medicine

## 2019-02-17 VITALS — BP 162/95 | HR 81 | Temp 98.0°F | Resp 20 | Ht 63.0 in | Wt 198.8 lb

## 2019-02-17 DIAGNOSIS — C3492 Malignant neoplasm of unspecified part of left bronchus or lung: Secondary | ICD-10-CM

## 2019-02-17 DIAGNOSIS — C7951 Secondary malignant neoplasm of bone: Secondary | ICD-10-CM

## 2019-02-17 DIAGNOSIS — I10 Essential (primary) hypertension: Secondary | ICD-10-CM | POA: Diagnosis not present

## 2019-02-17 DIAGNOSIS — C787 Secondary malignant neoplasm of liver and intrahepatic bile duct: Secondary | ICD-10-CM

## 2019-02-17 DIAGNOSIS — Z7189 Other specified counseling: Secondary | ICD-10-CM

## 2019-02-17 DIAGNOSIS — Z5112 Encounter for antineoplastic immunotherapy: Secondary | ICD-10-CM | POA: Diagnosis not present

## 2019-02-17 DIAGNOSIS — E871 Hypo-osmolality and hyponatremia: Secondary | ICD-10-CM | POA: Diagnosis not present

## 2019-02-17 DIAGNOSIS — C7800 Secondary malignant neoplasm of unspecified lung: Secondary | ICD-10-CM | POA: Diagnosis not present

## 2019-02-17 DIAGNOSIS — C3412 Malignant neoplasm of upper lobe, left bronchus or lung: Secondary | ICD-10-CM

## 2019-02-17 DIAGNOSIS — R5382 Chronic fatigue, unspecified: Secondary | ICD-10-CM

## 2019-02-17 DIAGNOSIS — Z5111 Encounter for antineoplastic chemotherapy: Secondary | ICD-10-CM

## 2019-02-17 DIAGNOSIS — Z79899 Other long term (current) drug therapy: Secondary | ICD-10-CM

## 2019-02-17 DIAGNOSIS — K589 Irritable bowel syndrome without diarrhea: Secondary | ICD-10-CM | POA: Diagnosis not present

## 2019-02-17 DIAGNOSIS — E876 Hypokalemia: Secondary | ICD-10-CM

## 2019-02-17 DIAGNOSIS — E559 Vitamin D deficiency, unspecified: Secondary | ICD-10-CM | POA: Diagnosis not present

## 2019-02-17 DIAGNOSIS — K219 Gastro-esophageal reflux disease without esophagitis: Secondary | ICD-10-CM | POA: Diagnosis not present

## 2019-02-17 DIAGNOSIS — J449 Chronic obstructive pulmonary disease, unspecified: Secondary | ICD-10-CM | POA: Diagnosis not present

## 2019-02-17 DIAGNOSIS — J8410 Pulmonary fibrosis, unspecified: Secondary | ICD-10-CM | POA: Diagnosis not present

## 2019-02-17 DIAGNOSIS — Z95828 Presence of other vascular implants and grafts: Secondary | ICD-10-CM

## 2019-02-17 DIAGNOSIS — Z87891 Personal history of nicotine dependence: Secondary | ICD-10-CM | POA: Diagnosis not present

## 2019-02-17 DIAGNOSIS — J189 Pneumonia, unspecified organism: Secondary | ICD-10-CM | POA: Diagnosis not present

## 2019-02-17 DIAGNOSIS — E785 Hyperlipidemia, unspecified: Secondary | ICD-10-CM | POA: Diagnosis not present

## 2019-02-17 DIAGNOSIS — M797 Fibromyalgia: Secondary | ICD-10-CM | POA: Diagnosis not present

## 2019-02-17 LAB — CMP (CANCER CENTER ONLY)
ALT: 42 U/L (ref 0–44)
AST: 54 U/L — ABNORMAL HIGH (ref 15–41)
Albumin: 3.9 g/dL (ref 3.5–5.0)
Alkaline Phosphatase: 186 U/L — ABNORMAL HIGH (ref 38–126)
Anion gap: 11 (ref 5–15)
BUN: 6 mg/dL (ref 6–20)
CHLORIDE: 86 mmol/L — AB (ref 98–111)
CO2: 30 mmol/L (ref 22–32)
Calcium: 8.9 mg/dL (ref 8.9–10.3)
Creatinine: 0.62 mg/dL (ref 0.44–1.00)
GFR, Est AFR Am: >60 mL/min (ref >60)
GFR, Estimated: >60 mL/min (ref >60)
Glucose, Bld: 93 mg/dL (ref 70–99)
Potassium: 3 mmol/L — CL (ref 3.5–5.1)
Sodium: 127 mmol/L — ABNORMAL LOW (ref 135–145)
Total Bilirubin: 0.5 mg/dL (ref 0.3–1.2)
Total Protein: 8 g/dL (ref 6.5–8.1)

## 2019-02-17 LAB — CBC WITH DIFFERENTIAL (CANCER CENTER ONLY)
Abs Immature Granulocytes: 0.01 10*3/uL (ref 0.00–0.07)
BASOS ABS: 0.1 10*3/uL (ref 0.0–0.1)
Basophils Relative: 1 %
EOS PCT: 6 %
Eosinophils Absolute: 0.3 10*3/uL (ref 0.0–0.5)
HCT: 35.9 % — ABNORMAL LOW (ref 36.0–46.0)
Hemoglobin: 11.6 g/dL — ABNORMAL LOW (ref 12.0–15.0)
Immature Granulocytes: 0 %
LYMPHS PCT: 43 %
Lymphs Abs: 2.3 10*3/uL (ref 0.7–4.0)
MCH: 28.9 pg (ref 26.0–34.0)
MCHC: 32.3 g/dL (ref 30.0–36.0)
MCV: 89.3 fL (ref 80.0–100.0)
Monocytes Absolute: 0.6 10*3/uL (ref 0.1–1.0)
Monocytes Relative: 11 %
Neutro Abs: 2 10*3/uL (ref 1.7–7.7)
Neutrophils Relative %: 39 %
Platelet Count: 139 10*3/uL — ABNORMAL LOW (ref 150–400)
RBC: 4.02 MIL/uL (ref 3.87–5.11)
RDW: 15.5 % (ref 11.5–15.5)
WBC Count: 5.2 10*3/uL (ref 4.0–10.5)
nRBC: 0 % (ref 0.0–0.2)

## 2019-02-17 LAB — TSH: TSH: 28.182 u[IU]/mL — ABNORMAL HIGH (ref 0.308–3.960)

## 2019-02-17 MED ORDER — HEPARIN SOD (PORK) LOCK FLUSH 100 UNIT/ML IV SOLN
500.0000 [IU] | Freq: Once | INTRAVENOUS | Status: AC | PRN
  Administered 2019-02-17: 500 [IU]
  Filled 2019-02-17: qty 5

## 2019-02-17 MED ORDER — SODIUM CHLORIDE 0.9% FLUSH
10.0000 mL | INTRAVENOUS | Status: DC | PRN
  Administered 2019-02-17: 10 mL
  Filled 2019-02-17: qty 10

## 2019-02-17 MED ORDER — POTASSIUM CHLORIDE ER 20 MEQ PO TBCR: 10.0000 meq | EXTENDED_RELEASE_TABLET | Freq: Every day | ORAL | 0 refills | Status: DC

## 2019-02-17 NOTE — Addendum Note (Signed)
Addended by: Will Bonnet on: 02/17/2019 01:01 PM   Modules accepted: Orders

## 2019-02-17 NOTE — Progress Notes (Signed)
[place] [facility] [phone]   [phone]  OFFICE PROGRESS NOTE  [name], MD [place] [address]  DIAGNOSIS: Extensive stage (T3, N2, M1a)  small cell lung cancer diagnosed in August 2019 and presented with large left hilar/subhilar mass in addition to mediastinal lymphadenopathy and contracture and mass in the right upper lobe.  PRIOR THERAPY: Systemic therapy with carboplatin for AUC of 5 on day 1, etoposide 100 mg/M2 on days 1, 2 and 3 as well as Tecentriq (Atezolizumab) 1200 mg IV every 3 weeks with Neulasta support.  Status post 10 cycles.  Starting from cycle #7, the patient will be treated with only single agent immunotherapy with Tecentriq 1200 mg IV every 3 weeks.  Last dose was given January 27, 2019 discontinued secondary to disease progression  CURRENT THERAPY: None  INTERVAL HISTORY: [name] 63 y.o. female returns to the clinic today for follow-up visit.  The patient is feeling fine today with no concerning complaints except for increasing cough and shortness of breath with exertion.  She denied having any current chest pain or hemoptysis.  She denied having any fever or chills.  She has no nausea, vomiting, diarrhea or constipation.  The patient has no recent weight loss or night sweats.  She has no headache or visual changes.  She has been tolerating her maintenance treatment with Tecentriq fairly well.  She had repeat CT scan of the chest, abdomen and pelvis performed recently and she is here for evaluation and discussion of her scan results and treatment options.  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 This doesn't  fit entirely with emphysema.       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  MEDICATIONS:  Current Outpatient Medications  Medication Sig Dispense Refill   albuterol (PROVENTIL) (2.5 MG/3ML) 0.083% nebulizer solution Take 3 mLs (2.5 mg total) by nebulization every 6 (six) hours as needed. 90 mL 4   allopurinol (ZYLOPRIM) 300 MG tablet Take 1 tablet (300 mg total) by mouth daily. Begin on 07/25/2018 14 tablet 0   amitriptyline (ELAVIL) 75 MG tablet Take 75 mg by mouth at bedtime.       benzonatate (TESSALON) 100 MG capsule Take 1 capsule (100 mg total) by mouth 3 (three) times daily as needed for cough. (Patient not taking: Reported on 01/06/2019) 20 capsule 0   demeclocycline (DECLOMYCIN) 150 MG tablet Take 1 tablet (150 mg total) by mouth 2 (two) times daily. 60 tablet 1   ezetimibe (ZETIA) 10 MG tablet TAKE 1 TABLET ONCE A DAY 90  tablet 0   feeding supplement, ENSURE ENLIVE, (ENSURE ENLIVE) LIQD Take 237 mLs by mouth 2 (two) times daily between meals. 60 Bottle 0   fluticasone (FLONASE) 50 MCG/ACT nasal spray USE 2 SPRAYS IN EACH NOSTRIL ONCE DAILY. 16 g 5   folic acid (FOLVITE) 1 MG tablet Take 1 mg by mouth daily.     Glycopyrrolate-Formoterol (BEVESPI AEROSPHERE) 9-4.8 MCG/ACT AERO Inhale 2 puffs into the lungs 2 (two) times  daily. 1 Inhaler 12   HYDROcodone-acetaminophen (NORCO/VICODIN) 5-325 MG tablet Take 1 tablet by mouth 3 (three) times daily. 30 tablet 0   levothyroxine (SYNTHROID, LEVOTHROID) 50 MCG tablet TAKE (1) TABLET DAILY BE- FORE BREAKFAST. 30 tablet 0   lidocaine-prilocaine (EMLA) cream Apply 1 application topically as needed. 30 g 0   Nebulizers (COMPRESSOR NEBULIZER) MISC 1 Device by Does not apply route as needed. 1 each prn   omeprazole (PRILOSEC) 40 MG capsule TAKE (1) CAPSULE DAILY 90 capsule 0   PROAIR HFA 108 (90 Base) MCG/ACT inhaler 2 PUFFS EVERY 4 HOURS AS NEEDED FOR WHEEZING 8.5 g 0   prochlorperazine (COMPAZINE) 10 MG tablet Take 1 tablet (10 mg total) by mouth every 6 (six) hours as needed for nausea or vomiting. (Patient not taking: Reported on 01/06/2019) 30 tablet 0   rosuvastatin (CRESTOR) 40 MG tablet Take 0.5 tablets (20 mg total) by mouth daily. 45 tablet 3   No current facility-administered medications for this visit.     SURGICAL HISTORY:  Past Surgical History:  Procedure Laterality Date   ABDOMINAL HYSTERECTOMY     IR IMAGING GUIDED PORT INSERTION  08/02/2018   LUMBAR DISC SURGERY     VIDEO BRONCHOSCOPY Bilateral 07/10/2018   Procedure: VIDEO BRONCHOSCOPY WITHOUT FLUORO;  Surgeon: Laurin Coder, MD;  Location: [facility];  Service: Cardiopulmonary;  Laterality: Bilateral;    REVIEW OF SYSTEMS:  Constitutional: negative Eyes: negative Ears, nose, mouth, throat, and face: negative Respiratory: positive for cough and dyspnea on exertion Cardiovascular: negative Gastrointestinal: negative Genitourinary:negative Integument/breast: negative Hematologic/lymphatic: negative Musculoskeletal:negative Neurological: negative Behavioral/Psych: negative Endocrine: negative Allergic/Immunologic: negative   PHYSICAL EXAMINATION: General appearance: alert, cooperative and no distress Head: Normocephalic, without obvious abnormality, atraumatic Neck: no  adenopathy, no JVD, supple, symmetrical, trachea midline and thyroid not enlarged, symmetric, no tenderness/mass/nodules Lymph nodes: Cervical, supraclavicular, and axillary nodes normal. Resp: clear to auscultation bilaterally Back: symmetric, no curvature. ROM normal. No CVA tenderness. Cardio: regular rate and rhythm, S1, S2 normal, no murmur, click, rub or gallop GI: soft, non-tender; bowel sounds normal; no masses,  no organomegaly Extremities: extremities normal, atraumatic, no cyanosis or edema Neurologic: Alert and oriented X 3, normal strength and tone. Normal symmetric reflexes. Normal coordination and gait  ECOG PERFORMANCE STATUS: 1 - Symptomatic but completely ambulatory  Blood pressure (!) 162/95, pulse 81, temperature 98 F (36.7 C), temperature source Oral, resp. rate 20, height 5\' 3"  (1.6 m), weight 198 lb 12.8 oz (90.2 kg), SpO2 98 %.  LABORATORY DATA: Lab Results  Component Value Date   WBC 5.2 02/17/2019   HGB 11.6 (L) 02/17/2019   HCT 35.9 (L) 02/17/2019   MCV 89.3 02/17/2019   PLT 139 (L) 02/17/2019      Chemistry      Component Value Date/Time   NA 131 (L) 01/27/2019 1306   NA 124 (L) 07/16/2018 0853   K 3.3 (L) 01/27/2019 1306   CL 91 (L) 01/27/2019 1306   CO2 30 01/27/2019 1306   BUN 7 01/27/2019 1306   BUN  8 07/16/2018 0853   CREATININE 0.67 01/27/2019 1306   CREATININE 0.68 02/13/2013 1523      Component Value Date/Time   CALCIUM 9.1 01/27/2019 1306   ALKPHOS 202 (H) 01/27/2019 1306   AST 38 01/27/2019 1306   ALT 23 01/27/2019 1306   BILITOT 0.5 01/27/2019 1306       RADIOGRAPHIC STUDIES: Ct Chest W Contrast  Result Date: 02/14/2019 CLINICAL DATA:  Extensive stage left lung small cell carcinoma diagnosed August 2019 status post chemotherapy and ongoing immunotherapy. Restaging. EXAM: CT CHEST, ABDOMEN, AND PELVIS WITH CONTRAST TECHNIQUE: Multidetector CT imaging of the chest, abdomen and pelvis was performed following the standard protocol  during bolus administration of intravenous contrast. CONTRAST:  111mL OMNIPAQUE IOHEXOL 300 MG/ML  SOLN COMPARISON:  12/13/2018 CT chest, abdomen and pelvis. FINDINGS: CT CHEST FINDINGS Cardiovascular: Normal heart size. No significant pericardial effusion/thickening. Left anterior descending and right coronary atherosclerosis. Right internal jugular Port-A-Cath terminates at the cavoatrial junction. Atherosclerotic nonaneurysmal thoracic aorta. Normal caliber pulmonary arteries. No central pulmonary emboli. Mediastinum/Nodes: No discrete thyroid nodules. Unremarkable esophagus. No axillary adenopathy. Newly enlarged 1.6 cm AP window node (series 2/image 20). Stable mildly enlarged 1.1 cm left paratracheal node (series 2/image 21). No right hilar adenopathy. Infiltrative left perihilar 5.7 x 4.5 cm mass (series 2/image 27), substantially increased from 1.4 x 1.3 cm using similar measurement technique on 12/13/2018 CT. Lungs/Pleura: No pneumothorax. No pleural effusion. Near occlusion of the left upper lobe bronchus, worsened. Worsened segmental bronchus occlusion in the central left lower lobe. Stable subcentimeter calcified superior segment right lower lobe granuloma. Stable 5 mm posterior left upper lobe solid pulmonary nodule (series 4/image 21). Moderate new lingular atelectasis. Slightly worsened subsegmental left lower lobe atelectasis. No additional significant pulmonary nodules. Moderate centrilobular and paraseptal emphysema. Musculoskeletal: Stable scattered sclerotic lesions in the thoracic vertebral bodies, most prominent at T3 and T10. No appreciable new focal osseous lesions in the chest. Mild thoracic spondylosis. CT ABDOMEN PELVIS FINDINGS Hepatobiliary: Diffusely irregular liver surface with relative hypertrophy of the lateral segment left liver lobe, unchanged, compatible with hepatic cirrhosis. Numerous subcentimeter hypodense lesions scattered throughout the liver, too small to characterize, not  convincingly changed. Normal gallbladder with no radiopaque cholelithiasis. No biliary ductal dilatation. Pancreas: Normal, with no mass or duct dilation. Spleen: Normal size. No mass. Adrenals/Urinary Tract: Normal adrenals. Normal kidneys with no hydronephrosis and no renal mass. Nonspecific small amount of gas in the nondependent bladder. Otherwise normal bladder. Stomach/Bowel: Normal non-distended stomach. Normal caliber small bowel with no small bowel wall thickening. Normal appendix. Large colonic stool volume. No large bowel wall thickening, significant diverticulosis or acute pericolonic fat stranding. Oral contrast transits to the cecum. Vascular/Lymphatic: Atherosclerotic nonaneurysmal abdominal aorta. Patent portal, splenic, hepatic and renal veins. No pathologically enlarged lymph nodes in the abdomen or pelvis. Reproductive: Status post hysterectomy, with no abnormal findings at the vaginal cuff. No adnexal mass. Other: No pneumoperitoneum, ascites or focal fluid collection. Musculoskeletal: Scattered sclerotic lesions in L3 vertebral body and bilateral pelvic girdle are stable, largest 1.7 cm in the medial right iliac bone (series 2/image 92). No appreciable new focal osseous lesions. Stable bone cage in the L4-5 disc space. Mild lumbar spondylosis. IMPRESSION: 1. Progression of metastatic disease in the chest. Left perihilar mass is substantially increased in size. New AP window adenopathy. 2. Stable sclerotic osseous lesions throughout the spine and pelvic girdle. No new focal osseous lesions. 3. Hepatic cirrhosis with numerous subcentimeter low-attenuation liver lesions, too small to characterize, not definitely  changed. 4. Large colonic stool volume suggests constipation. 5. Aortic Atherosclerosis (ICD10-I70.0) and Emphysema (ICD10-J43.9). Electronically Signed   By: Ilona Sorrel M.D.   On: 02/14/2019 12:00   Ct Abdomen Pelvis W Contrast  Result Date: 02/14/2019 CLINICAL DATA:  Extensive stage  left lung small cell carcinoma diagnosed August 2019 status post chemotherapy and ongoing immunotherapy. Restaging. EXAM: CT CHEST, ABDOMEN, AND PELVIS WITH CONTRAST TECHNIQUE: Multidetector CT imaging of the chest, abdomen and pelvis was performed following the standard protocol during bolus administration of intravenous contrast. CONTRAST:  156mL OMNIPAQUE IOHEXOL 300 MG/ML  SOLN COMPARISON:  12/13/2018 CT chest, abdomen and pelvis. FINDINGS: CT CHEST FINDINGS Cardiovascular: Normal heart size. No significant pericardial effusion/thickening. Left anterior descending and right coronary atherosclerosis. Right internal jugular Port-A-Cath terminates at the cavoatrial junction. Atherosclerotic nonaneurysmal thoracic aorta. Normal caliber pulmonary arteries. No central pulmonary emboli. Mediastinum/Nodes: No discrete thyroid nodules. Unremarkable esophagus. No axillary adenopathy. Newly enlarged 1.6 cm AP window node (series 2/image 20). Stable mildly enlarged 1.1 cm left paratracheal node (series 2/image 21). No right hilar adenopathy. Infiltrative left perihilar 5.7 x 4.5 cm mass (series 2/image 27), substantially increased from 1.4 x 1.3 cm using similar measurement technique on 12/13/2018 CT. Lungs/Pleura: No pneumothorax. No pleural effusion. Near occlusion of the left upper lobe bronchus, worsened. Worsened segmental bronchus occlusion in the central left lower lobe. Stable subcentimeter calcified superior segment right lower lobe granuloma. Stable 5 mm posterior left upper lobe solid pulmonary nodule (series 4/image 21). Moderate new lingular atelectasis. Slightly worsened subsegmental left lower lobe atelectasis. No additional significant pulmonary nodules. Moderate centrilobular and paraseptal emphysema. Musculoskeletal: Stable scattered sclerotic lesions in the thoracic vertebral bodies, most prominent at T3 and T10. No appreciable new focal osseous lesions in the chest. Mild thoracic spondylosis. CT ABDOMEN  PELVIS FINDINGS Hepatobiliary: Diffusely irregular liver surface with relative hypertrophy of the lateral segment left liver lobe, unchanged, compatible with hepatic cirrhosis. Numerous subcentimeter hypodense lesions scattered throughout the liver, too small to characterize, not convincingly changed. Normal gallbladder with no radiopaque cholelithiasis. No biliary ductal dilatation. Pancreas: Normal, with no mass or duct dilation. Spleen: Normal size. No mass. Adrenals/Urinary Tract: Normal adrenals. Normal kidneys with no hydronephrosis and no renal mass. Nonspecific small amount of gas in the nondependent bladder. Otherwise normal bladder. Stomach/Bowel: Normal non-distended stomach. Normal caliber small bowel with no small bowel wall thickening. Normal appendix. Large colonic stool volume. No large bowel wall thickening, significant diverticulosis or acute pericolonic fat stranding. Oral contrast transits to the cecum. Vascular/Lymphatic: Atherosclerotic nonaneurysmal abdominal aorta. Patent portal, splenic, hepatic and renal veins. No pathologically enlarged lymph nodes in the abdomen or pelvis. Reproductive: Status post hysterectomy, with no abnormal findings at the vaginal cuff. No adnexal mass. Other: No pneumoperitoneum, ascites or focal fluid collection. Musculoskeletal: Scattered sclerotic lesions in L3 vertebral body and bilateral pelvic girdle are stable, largest 1.7 cm in the medial right iliac bone (series 2/image 92). No appreciable new focal osseous lesions. Stable bone cage in the L4-5 disc space. Mild lumbar spondylosis. IMPRESSION: 1. Progression of metastatic disease in the chest. Left perihilar mass is substantially increased in size. New AP window adenopathy. 2. Stable sclerotic osseous lesions throughout the spine and pelvic girdle. No new focal osseous lesions. 3. Hepatic cirrhosis with numerous subcentimeter low-attenuation liver lesions, too small to characterize, not definitely changed.  4. Large colonic stool volume suggests constipation. 5. Aortic Atherosclerosis (ICD10-I70.0) and Emphysema (ICD10-J43.9). Electronically Signed   By: Ilona Sorrel M.D.   On: 02/14/2019 12:00  ASSESSMENT AND PLAN: This is a very pleasant 63 years old white female recently diagnosed with extensive stage small cell lung cancer.  The patient had a recent PET scan that showed multiple metastatic disease in the lung, liver as well as bone. The patient is currently on systemic chemotherapy with carboplatin, etoposide and Tecentriq status post 6 cycles. She tolerated the induction phase of her treatment well with no concerning complaints. The patient was started on maintenance treatment with single agent Tecentriq status post 4 cycles. She has been tolerating this treatment well with no concerning adverse effects. She had repeat CT scan of the chest, abdomen and pelvis performed recently. I personally and independently reviewed the scan images and discussed the result and showed the images to the patient today.  Unfortunately her scan showed progression of her disease in the chest with enlargement of left perihilar mass as well as new AP window lymphadenopathy but stable disease in the abdomen and skeletal metastases. I recommended for the patient to discontinue her current treatment with Tecentriq.  I recommended for her to see radiation oncology for consideration of palliative radiotherapy to the enlarging disease in the chest and mediastinum. I will arrange for the patient to come back for follow-up visit in less than 2 months with repeat CT scan of the chest, abdomen and pelvis for restaging of her disease before considering her for second line chemotherapy. For hypertension she will continue with the current blood pressure medication and monitor it closely at home. For the hyponatremia, she will continue on fluid restriction as well as treatment with demeclocycline 150 mg p.o. twice daily.  For the  hypokalemia, I will start the patient on potassium chloride 20 mEq p.o. daily for 10 days. The patient was advised to call immediately if she has any concerning symptoms in the interval. The patient voices understanding of current disease status and treatment options and is in agreement with the current care plan. All questions were answered. The patient knows to call the clinic with any problems, questions or concerns. We can certainly see the patient much sooner if necessary.  Disclaimer: This note was dictated with voice recognition software. Similar sounding words can inadvertently be transcribed and may not be corrected upon review.

## 2019-02-19 ENCOUNTER — Ambulatory Visit: Payer: Medicare Other | Admitting: Family Medicine

## 2019-02-25 NOTE — Progress Notes (Signed)
Thoracic Location of Tumor / Histology: DIAGNOSIS: Extensive stage (T3, N2, M1a)small cell lung cancer diagnosed in August 2019 and presented with large left hilar/subhilar mass in addition to mediastinal lymphadenopathy and contracture and mass in the right upper lobe.  CT Chest/Abdomen/Pelvis 02/14/19:  IMPRESSION: 1. Progression of metastatic disease in the chest. Left perihilar mass is substantially increased in size. New AP window adenopathy. 2. Stable sclerotic osseous lesions throughout the spine and pelvic girdle. No new focal osseous lesions. 3. Hepatic cirrhosis with numerous subcentimeter low-attenuation liver lesions, too small to characterize, not definitely changed. 4. Large colonic stool volume suggests constipation.  Tobacco/Marijuana/Snuff/ETOH use: Tobacco Use  . Smoking status: Former Smoker    Packs/day: 0.50    Types: Cigarettes    Start date: 11/20/1972    Last attempt to quit: 04/01/2012   . Smokeless tobacco: Never Used  Substance and Sexual Activity  . Alcohol use: No  . Drug use: No       Past/Anticipated interventions by cardiothoracic surgery, if any: None at this time  Past/Anticipated interventions by medical oncology, if any: PRIOR THERAPY: Systemic therapy with carboplatin for AUC of 5 on day 1, etoposide 100 mg/M2 on days 1, 2 and 3 as well as Tecentriq (Atezolizumab) 1200 mg IV every 3 weeks with Neulasta support.  Status post 10 cycles.  Starting from cycle #7, the patient will be treated with only single agent immunotherapy with Tecentriq 1200 mg IV every 3 weeks.  Last dose was given January 27, 2019 discontinued secondary to disease progression   Per Dr. Julien Nordmann 02/17/19:  She had repeat CT scan of the chest, abdomen and pelvis performed recently. I personally and independently reviewed the scan images and discussed the result and showed the images to the patient today.  Unfortunately her scan showed progression of her disease in the chest with  enlargement of left perihilar mass as well as new AP window lymphadenopathy but stable disease in the abdomen and skeletal metastases. I recommended for the patient to discontinue her current treatment with Tecentriq.  I recommended for her to see radiation oncology for consideration of palliative radiotherapy to the enlarging disease in the chest and mediastinum. I will arrange for the patient to come back for follow-up visit in less than 2 months with repeat CT scan of the chest, abdomen and pelvis for restaging of her disease before considering her for second line chemotherapy.  Signs/Symptoms Weight changes, if any:  Wt Readings from Last 3 Encounters:  02/27/19 194 lb 9.6 oz (88.3 kg)  02/17/19 198 lb 12.8 oz (90.2 kg)  01/27/19 194 lb 1.6 oz (88 kg)       Respiratory complaints, if any: increasing cough and shortness of breath with exertion  Hemoptysis, if any: denied having any current chest pain or hemoptysis  Pain issues, if any:  Pt reports pain, mainly in head and B knees. Pt is not sure if that pain is attributed to cancer, fibromyalgia, osteoporosis, or osteoarthritis. Pt rates this pain 5/10.  SAFETY ISSUES:  Prior radiation? No  Pacemaker/ICD? {No  Possible current pregnancy? No  Is the patient on methotrexate? No  Current Complaints / other details:  Pt presents today for initial consult with Dr. Sondra Come for Radiation Oncology. Pt is anxious.   BP (!) 188/109 (BP Location: Right Arm, Patient Position: Sitting)   Pulse 87   Temp 98.2 F (36.8 C) (Oral)   Ht 5\' 5"  (1.651 m)   Wt 194 lb 9.6 oz (88.3 kg)  SpO2 94%   BMI 32.38 kg/m   Loma Sousa, RN BSN

## 2019-02-26 ENCOUNTER — Other Ambulatory Visit: Payer: Self-pay | Admitting: Internal Medicine

## 2019-02-27 ENCOUNTER — Other Ambulatory Visit: Payer: Self-pay | Admitting: *Deleted

## 2019-02-27 ENCOUNTER — Ambulatory Visit
Admission: RE | Admit: 2019-02-27 | Discharge: 2019-02-27 | Disposition: A | Discharge status: Home / Self Care | Payer: Medicare Other | Source: Ambulatory Visit | Attending: Radiation Oncology | Admitting: Radiation Oncology

## 2019-02-27 ENCOUNTER — Encounter: Payer: Self-pay | Admitting: Radiation Oncology

## 2019-02-27 ENCOUNTER — Other Ambulatory Visit: Payer: Self-pay

## 2019-02-27 VITALS — BP 188/109 | HR 87 | Temp 98.2°F | Ht 65.0 in | Wt 194.6 lb

## 2019-02-27 DIAGNOSIS — I2510 Atherosclerotic heart disease of native coronary artery without angina pectoris: Secondary | ICD-10-CM | POA: Insufficient documentation

## 2019-02-27 DIAGNOSIS — Z87891 Personal history of nicotine dependence: Secondary | ICD-10-CM | POA: Insufficient documentation

## 2019-02-27 DIAGNOSIS — G4730 Sleep apnea, unspecified: Secondary | ICD-10-CM | POA: Insufficient documentation

## 2019-02-27 DIAGNOSIS — C771 Secondary and unspecified malignant neoplasm of intrathoracic lymph nodes: Secondary | ICD-10-CM | POA: Diagnosis not present

## 2019-02-27 DIAGNOSIS — K219 Gastro-esophageal reflux disease without esophagitis: Secondary | ICD-10-CM | POA: Insufficient documentation

## 2019-02-27 DIAGNOSIS — E559 Vitamin D deficiency, unspecified: Secondary | ICD-10-CM | POA: Diagnosis not present

## 2019-02-27 DIAGNOSIS — Z90710 Acquired absence of both cervix and uterus: Secondary | ICD-10-CM | POA: Diagnosis not present

## 2019-02-27 DIAGNOSIS — E8801 Alpha-1-antitrypsin deficiency: Secondary | ICD-10-CM | POA: Diagnosis not present

## 2019-02-27 DIAGNOSIS — M810 Age-related osteoporosis without current pathological fracture: Secondary | ICD-10-CM | POA: Insufficient documentation

## 2019-02-27 DIAGNOSIS — R51 Headache: Secondary | ICD-10-CM | POA: Diagnosis not present

## 2019-02-27 DIAGNOSIS — C3492 Malignant neoplasm of unspecified part of left bronchus or lung: Secondary | ICD-10-CM

## 2019-02-27 DIAGNOSIS — Z801 Family history of malignant neoplasm of trachea, bronchus and lung: Secondary | ICD-10-CM | POA: Diagnosis not present

## 2019-02-27 DIAGNOSIS — E785 Hyperlipidemia, unspecified: Secondary | ICD-10-CM | POA: Insufficient documentation

## 2019-02-27 DIAGNOSIS — C3411 Malignant neoplasm of upper lobe, right bronchus or lung: Secondary | ICD-10-CM | POA: Insufficient documentation

## 2019-02-27 DIAGNOSIS — Z800 Family history of malignant neoplasm of digestive organs: Secondary | ICD-10-CM | POA: Diagnosis not present

## 2019-02-27 DIAGNOSIS — Z806 Family history of leukemia: Secondary | ICD-10-CM | POA: Insufficient documentation

## 2019-02-27 DIAGNOSIS — Z923 Personal history of irradiation: Secondary | ICD-10-CM | POA: Insufficient documentation

## 2019-02-27 DIAGNOSIS — C3432 Malignant neoplasm of lower lobe, left bronchus or lung: Secondary | ICD-10-CM | POA: Diagnosis not present

## 2019-02-27 DIAGNOSIS — Z9221 Personal history of antineoplastic chemotherapy: Secondary | ICD-10-CM | POA: Diagnosis not present

## 2019-02-27 DIAGNOSIS — M1990 Unspecified osteoarthritis, unspecified site: Secondary | ICD-10-CM | POA: Insufficient documentation

## 2019-02-27 DIAGNOSIS — J449 Chronic obstructive pulmonary disease, unspecified: Secondary | ICD-10-CM | POA: Diagnosis not present

## 2019-02-27 DIAGNOSIS — C7951 Secondary malignant neoplasm of bone: Secondary | ICD-10-CM | POA: Diagnosis not present

## 2019-02-27 DIAGNOSIS — Z79899 Other long term (current) drug therapy: Secondary | ICD-10-CM | POA: Diagnosis not present

## 2019-02-27 DIAGNOSIS — Z8541 Personal history of malignant neoplasm of cervix uteri: Secondary | ICD-10-CM | POA: Diagnosis not present

## 2019-02-27 DIAGNOSIS — M797 Fibromyalgia: Secondary | ICD-10-CM | POA: Insufficient documentation

## 2019-02-27 NOTE — Patient Instructions (Signed)
Coronavirus (COVID-19) Are you at risk?  Are you at risk for the Coronavirus (COVID-19)?  To be considered HIGH RISK for Coronavirus (COVID-19), you have to meet the following criteria:  . Traveled to [place], [place], [place], [place] or [place]; or in the [place] to [place], [place], [place], or [place]; and have fever, cough, and shortness of breath within the last 2 weeks of travel OR . Been in close contact with a person diagnosed with COVID-19 within the last 2 weeks and have fever, cough, and shortness of breath . IF YOU DO NOT MEET THESE CRITERIA, YOU ARE CONSIDERED LOW RISK FOR COVID-19.  What to do if you are HIGH RISK for COVID-19?  . If you are having a medical emergency, call 911. . Seek medical care right away. Before you go to a doctor's office, urgent care or emergency department, call ahead and tell them about your recent travel, contact with someone diagnosed with COVID-19, and your symptoms. You should receive instructions from your physician's office regarding next steps of care.  . When you arrive at healthcare provider, tell the healthcare staff immediately you have returned from visiting [place], [place], [place], [place] or [place]; or traveled in the [place] to [place], [place], [place], or [place]; in the last two weeks or you have been in close contact with a person diagnosed with COVID-19 in the last 2 weeks.   . Tell the health care staff about your symptoms: fever, cough and shortness of breath. . After you have been seen by a medical provider, you will be either: o Tested for (COVID-19) and discharged home on quarantine except to seek medical care if symptoms worsen, and asked to  - Stay home and avoid contact with others until you get your results (4-5 days)  - Avoid travel on public transportation if possible (such as bus, train, or airplane) or o Sent to the Emergency Department by EMS for evaluation, COVID-19 testing, and possible  admission depending on your condition and test results.  What to do if you are LOW RISK for COVID-19?  Reduce your risk of any infection by using the same precautions used for avoiding the common cold or flu:  . Wash your hands often with soap and warm water for at least 20 seconds.  If soap and water are not readily available, use an alcohol-based hand sanitizer with at least 60% alcohol.  . If coughing or sneezing, cover your mouth and nose by coughing or sneezing into the elbow areas of your shirt or coat, into a tissue or into your sleeve (not your hands). . Avoid shaking hands with others and consider head nods or verbal greetings only. . Avoid touching your eyes, nose, or mouth with unwashed hands.  . Avoid close contact with people who are sick. . Avoid places or events with large numbers of people in one location, like concerts or sporting events. . Carefully consider travel plans you have or are making. . If you are planning any travel outside or inside the [place], visit the CDC's Travelers' Health webpage for the latest health notices. . If you have some symptoms but not all symptoms, continue to monitor at home and seek medical attention if your symptoms worsen. . If you are having a medical emergency, call 911.   ADDITIONAL HEALTHCARE OPTIONS FOR PATIENTS  [facility] / e-Visit: [email or web address]         [facility]: [phone]  [facility]: [phone]                   [facility]: [phone]   

## 2019-02-27 NOTE — Progress Notes (Signed)
[email or web address]

## 2019-02-27 NOTE — Progress Notes (Signed)
Radiation Oncology         (336) [phone] ________________________________  Initial Outpatient Consultation  Name: [name] MRN: [id number]  Date: 02/27/2019  DOB: 07/04/1956  CC:[name], MD  Curt Bears, MD   REFERRING PHYSICIAN: Curt Bears, MD  DIAGNOSIS: Extensive stage (T3, N2, M1a)small cell lung cancer diagnosed in August 2019 and presented with large left hilar/subhilar mass in addition to mediastinal lymphadenopathy and contracture and mass in the right upper lobe.  PRIOR THERAPY: Systemic therapy with carboplatin for AUC of 5 on day 1, etoposide 100 mg/M2 on days 1, 2 and 3 as well as Tecentriq (Atezolizumab) 1200 mg IV every 3 weeks with Neulasta support.  Status post 10 cycles.  Starting from cycle #7, the patient will be treated with only single agent immunotherapy with Tecentriq 1200 mg IV every 3 weeks.  Last dose was given January 27, 2019 discontinued secondary to disease progression   HISTORY OF PRESENT ILLNESS::[name] is a 63 y.o. female who is here today at the request of Dr. Julien Nordmann for diagnosis of extensive stage small cell lung cancer, diagnosed in August 2019. The patient initially presented with sinusitis. She was seen by Dr. Annamaria Boots and was started on antibiotics. She took 1 pill and had significant nausea and vomiting. She presented to [facility] for evaluation. She had acute abdominal series performed at that time, and the chest x-ray portion of the study on 07/06/2018 showed a supple hazy density over the right upper lobe as well as a 1.0 cm nodular opacity over the lateral left midlung with increased density and prominence of the AP window. This was followed by CT scan of the chest on the same day, and it showed left-sided mediastinal lymphadenopathy at all lymph node stations with index abnormal lymph node in the AP window measuring 2.2 cm in short axis. Subcarinal lymphadenopathy was also present. There was left hilar/subhilar  region soft tissue mass versus conglomerate of abnormal lymph nodes, measuring approximately 6.0 x 3.1 x 5.8 cm.  This mass caused compression of the left lower lobe pulmonary arterial branches and left lower lobe main bronchus with subsequent complete collapse of the left lower lobe. There was also mass-effect to the lower esophagus. There were multiple areas of groundglass opacity in the lungs bilaterally with an index lesion in the periphery of the right upper lobe measured 4.0 x 3.8 cm. Scan of the upper abdomen showed a 1.3 cm short axis lymph node in the gastrohepatic ligament area.  On 07/10/2018, the patient underwent video flexible fiberoptic bronchoscopy with bronchial washing, brushing as well as tissue biopsy of the mass in the left lower lobe and aspiration of the subcarinal lymph node under the care of Dr. Cicero Duck. Pathology revealed small cell carcinoma.  Dr. Annamaria Boots referred the patient Dr. Julien Nordmann for evaluation and recommendation regarding treatment of her condition. Initial staging PET scan on 07/25/2018 showed a large intensely hypermetabolic left perihilar lung mass invading the mediastinum and obstructing the left lower lobe bronchus, as well as hypermetabolic left hilar and left mediastinal lymph nodes. Additionally, there were multifocal areas of increased uptake within the liver, suspicious for metastatic disease, at at least 1 focus of increased uptake identified within the proximal right femur. There were also multiple enlarged and hypermetabolic upper abdominal lymph nodes. 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   The patient began systemic therapy on 07/24/2018 with carboplatin, etoposide,  and Tecentriq status post 6 cycles, followed by single agent Tecentriq status post 4 cycles. Last dose was given 01/27/2019, discontinued on 02/17/2019 secondary to disease progression. Her restaging CT C/A/P scans on  02/14/2019 showed progression of metastatic disease in the chest. Left perihilar mass is substantially increased in size from 1.4 x 1.3 in January 2020 to now 5.7 x 4.5 cm. New AP window adenopathy measuring 1.6 cm. Stable sclerotic osseous lesions throughout the spine and pelvic girdle. No new focal osseous lesions.   The patient reviewed the imaging results with Dr. Julien Nordmann and has kindly been referred today for discussion of palliative radiation treatment to the enlarging disease in the chest and mediastinum.   Of note, the patient has a history of cervical cancer 15 years ago, status post resection by Dr. Carlena Bjornstad.  The patient did not receive chemotherapy or radiation treatments after her surgery.  On review of systems, patient reports headaches upon awakening in the morning.  This is a relatively new problem for her.  She reports some difficulties with word finding and occasional dizziness.  Her headaches are located in the frontal as well as occipital region.  She has chronic bony/muscle pain which she relates to osteoarthritis, fibromyalgia.  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    PAST SURGICAL HISTORY: Past Surgical History:  Procedure Laterality Date   ABDOMINAL HYSTERECTOMY     IR IMAGING GUIDED PORT INSERTION  08/02/2018   LUMBAR DISC SURGERY     VIDEO BRONCHOSCOPY Bilateral 07/10/2018   Procedure: VIDEO BRONCHOSCOPY WITHOUT FLUORO;  Surgeon: Laurin Coder, MD;  Location: [facility]  ENDOSCOPY;  Service: Cardiopulmonary;  Laterality: Bilateral;    FAMILY HISTORY: family history includes Alpha-1 antitrypsin deficiency in her sister and another family member; CAD (age of onset: 72) in her brother; CAD (age of onset: 31) in her sister; Heart attack in her brother; Heart attack (age of onset: 83) in her father; Heart disease in her brother; Irregular heart beat in her mother; Leukemia in her brother; Lung cancer in her brother; Migraines in her father; Osteoporosis in her mother, sister, and sister; Pancreatic cancer (age of onset: 41) in her mother.  SOCIAL HISTORY:  reports that she quit smoking about 6 years ago. Her smoking use included cigarettes. She started smoking about 46 years ago. She smoked 0.50 packs per day. She has never used smokeless tobacco. She reports that she does not drink alcohol or use drugs.  ALLERGIES: Penicillins; Actonel [risedronate sodium]; Advair diskus [fluticasone-salmeterol]; Alendronate sodium; Aspirin; Azithromycin; Keflex [cephalexin]; Levofloxacin; Morphine; Nsaids; Omnicef [cefdinir]; and Erythromycin  MEDICATIONS:  Current Outpatient Medications  Medication Sig Dispense Refill   albuterol (PROVENTIL) (2.5 MG/3ML) 0.083% nebulizer solution Take 3 mLs (2.5 mg total) by nebulization every 6 (six) hours as needed. 90 mL 4   allopurinol (ZYLOPRIM) 300 MG tablet Take 1 tablet (300 mg total) by mouth daily. Begin on 07/25/2018 14 tablet 0   amitriptyline (ELAVIL) 75 MG tablet Take 75 mg by mouth at bedtime.       demeclocycline (DECLOMYCIN) 150 MG tablet TAKE  (1)  TABLET TWICE A DAY. 60 tablet 0   ezetimibe (ZETIA) 10 MG  tablet TAKE 1 TABLET ONCE A DAY 90 tablet 0   feeding supplement, ENSURE ENLIVE, (ENSURE ENLIVE) LIQD Take 237 mLs by mouth 2 (two) times daily between meals. 60 Bottle 0   fluticasone (FLONASE) 50 MCG/ACT nasal spray USE 2 SPRAYS IN EACH NOSTRIL ONCE DAILY. 16 g 5   folic acid (FOLVITE) 1 MG tablet Take 1 mg by mouth  daily.     Glycopyrrolate-Formoterol (BEVESPI AEROSPHERE) 9-4.8 MCG/ACT AERO Inhale 2 puffs into the lungs 2 (two) times daily. 1 Inhaler 12   HYDROcodone-acetaminophen (NORCO/VICODIN) 5-325 MG tablet Take 1 tablet by mouth 3 (three) times daily. 30 tablet 0   levothyroxine (SYNTHROID, LEVOTHROID) 50 MCG tablet TAKE (1) TABLET DAILY BE- FORE BREAKFAST. 30 tablet 0   lidocaine-prilocaine (EMLA) cream Apply 1 application topically as needed. 30 g 0   Nebulizers (COMPRESSOR NEBULIZER) MISC 1 Device by Does not apply route as needed. 1 each prn   omeprazole (PRILOSEC) 40 MG capsule TAKE (1) CAPSULE DAILY 90 capsule 0   potassium chloride 20 MEQ TBCR Take 10 mEq by mouth daily. 10 tablet 0   PROAIR HFA 108 (90 Base) MCG/ACT inhaler 2 PUFFS EVERY 4 HOURS AS NEEDED FOR WHEEZING 8.5 g 0   rosuvastatin (CRESTOR) 40 MG tablet Take 0.5 tablets (20 mg total) by mouth daily. 45 tablet 3   benzonatate (TESSALON) 100 MG capsule Take 1 capsule (100 mg total) by mouth 3 (three) times daily as needed for cough. (Patient not taking: Reported on 01/06/2019) 20 capsule 0   prochlorperazine (COMPAZINE) 10 MG tablet Take 1 tablet (10 mg total) by mouth every 6 (six) hours as needed for nausea or vomiting. (Patient not taking: Reported on 01/06/2019) 30 tablet 0   No current facility-administered medications for this encounter.     REVIEW OF SYSTEMS: REVIEW OF SYSTEMS: A 10+ POINT REVIEW OF SYSTEMS WAS OBTAINED including neurology, dermatology, psychiatry, cardiac, respiratory, lymph, extremities, GI, GU, musculoskeletal, constitutional, reproductive, HEENT.  Patient does report new onset headaches which occur primarily in the morning upon awakening.  She also has noticed some occasional dizziness and the patient's daughter reports some difficulties with word finding by the patient.  She denies any pain in the chest area or hemoptysis.  She denies any recent worsening of her breathing.  She does notice some mild  difficulties with swallowing food   PHYSICAL EXAM:  height is 5\' 5"  (1.651 m) and weight is 194 lb 9.6 oz (88.3 kg). Her oral temperature is 98.2 F (36.8 C). Her blood pressure is 188/109 (abnormal) and her pulse is 87. Her oxygen saturation is 94%.   General: Alert and oriented, in no acute distress HEENT: Head is normocephalic. Extraocular movements are intact. Oropharynx is clear.  Mask in place in light of Covoid 19 pandemic, wears dentures Neck: Neck is supple, no palpable cervical or supraclavicular lymphadenopathy. Heart: Regular in rate and rhythm with no murmurs, rubs, or gallops. Chest: Clear to auscultation bilaterally, with no rhonchi, wheezes, or rales. Abdomen: Soft, nontender, nondistended, with no rigidity or guarding. Extremities: No cyanosis or edema. Lymphatics: see Neck Exam Skin: No concerning lesions. Musculoskeletal: symmetric strength and muscle tone throughout. Neurologic: Cranial nerves II through XII are grossly intact. No obvious focalities. Speech is fluent. Coordination is intact. Psychiatric: Judgment and insight are intact. Affect is appropriate.     ECOG = 1  0 - Asymptomatic (Fully active, able to carry on all predisease activities without restriction)  1 - Symptomatic but completely ambulatory (Restricted in  physically strenuous activity but ambulatory and able to carry out work of a light or sedentary nature. For example, light housework, office work)  2 - Symptomatic, <50% in bed during the day (Ambulatory and capable of all self care but unable to carry out any work activities. Up and about more than 50% of waking hours)  3 - Symptomatic, >50% in bed, but not bedbound (Capable of only limited self-care, confined to bed or chair 50% or more of waking hours)  4 - Bedbound (Completely disabled. Cannot carry on any self-care. Totally confined to bed or chair)  5 - Death   Eustace Pen MM, Creech RH, Tormey DC, et al. [phone]). "Toxicity and response criteria  of the [facility]". [place] Oncol. 5 (6): 649-55  LABORATORY DATA:  Lab Results  Component Value Date   WBC 5.2 02/17/2019   HGB 11.6 (L) 02/17/2019   HCT 35.9 (L) 02/17/2019   MCV 89.3 02/17/2019   PLT 139 (L) 02/17/2019   NEUTROABS 2.0 02/17/2019   Lab Results  Component Value Date   NA 127 (L) 02/17/2019   K 3.0 (LL) 02/17/2019   CL 86 (L) 02/17/2019   CO2 30 02/17/2019   GLUCOSE 93 02/17/2019   CREATININE 0.62 02/17/2019   CALCIUM 8.9 02/17/2019      RADIOGRAPHY: Ct Chest W Contrast  Result Date: 02/14/2019 CLINICAL DATA:  Extensive stage left lung small cell carcinoma diagnosed August 2019 status post chemotherapy and ongoing immunotherapy. Restaging. EXAM: CT CHEST, ABDOMEN, AND PELVIS WITH CONTRAST TECHNIQUE: Multidetector CT imaging of the chest, abdomen and pelvis was performed following the standard protocol during bolus administration of intravenous contrast. CONTRAST:  150mL OMNIPAQUE IOHEXOL 300 MG/ML  SOLN COMPARISON:  12/13/2018 CT chest, abdomen and pelvis. FINDINGS: CT CHEST FINDINGS Cardiovascular: Normal heart size. No significant pericardial effusion/thickening. Left anterior descending and right coronary atherosclerosis. Right internal jugular Port-A-Cath terminates at the cavoatrial junction. Atherosclerotic nonaneurysmal thoracic aorta. Normal caliber pulmonary arteries. No central pulmonary emboli. Mediastinum/Nodes: No discrete thyroid nodules. Unremarkable esophagus. No axillary adenopathy. Newly enlarged 1.6 cm AP window node (series 2/image 20). Stable mildly enlarged 1.1 cm left paratracheal node (series 2/image 21). No right hilar adenopathy. Infiltrative left perihilar 5.7 x 4.5 cm mass (series 2/image 27), substantially increased from 1.4 x 1.3 cm using similar measurement technique on 12/13/2018 CT. Lungs/Pleura: No pneumothorax. No pleural effusion. Near occlusion of the left upper lobe bronchus, worsened. Worsened segmental  bronchus occlusion in the central left lower lobe. Stable subcentimeter calcified superior segment right lower lobe granuloma. Stable 5 mm posterior left upper lobe solid pulmonary nodule (series 4/image 21). Moderate new lingular atelectasis. Slightly worsened subsegmental left lower lobe atelectasis. No additional significant pulmonary nodules. Moderate centrilobular and paraseptal emphysema. Musculoskeletal: Stable scattered sclerotic lesions in the thoracic vertebral bodies, most prominent at T3 and T10. No appreciable new focal osseous lesions in the chest. Mild thoracic spondylosis. CT ABDOMEN PELVIS FINDINGS Hepatobiliary: Diffusely irregular liver surface with relative hypertrophy of the lateral segment left liver lobe, unchanged, compatible with hepatic cirrhosis. Numerous subcentimeter hypodense lesions scattered throughout the liver, too small to characterize, not convincingly changed. Normal gallbladder with no radiopaque cholelithiasis. No biliary ductal dilatation. Pancreas: Normal, with no mass or duct dilation. Spleen: Normal size. No mass. Adrenals/Urinary Tract: Normal adrenals. Normal kidneys with no hydronephrosis and no renal mass. Nonspecific small amount of gas in the nondependent bladder. Otherwise normal bladder. Stomach/Bowel: Normal non-distended stomach. Normal caliber small bowel with no small bowel wall  thickening. Normal appendix. Large colonic stool volume. No large bowel wall thickening, significant diverticulosis or acute pericolonic fat stranding. Oral contrast transits to the cecum. Vascular/Lymphatic: Atherosclerotic nonaneurysmal abdominal aorta. Patent portal, splenic, hepatic and renal veins. No pathologically enlarged lymph nodes in the abdomen or pelvis. Reproductive: Status post hysterectomy, with no abnormal findings at the vaginal cuff. No adnexal mass. Other: No pneumoperitoneum, ascites or focal fluid collection. Musculoskeletal: Scattered sclerotic lesions in L3  vertebral body and bilateral pelvic girdle are stable, largest 1.7 cm in the medial right iliac bone (series 2/image 92). No appreciable new focal osseous lesions. Stable bone cage in the L4-5 disc space. Mild lumbar spondylosis. IMPRESSION: 1. Progression of metastatic disease in the chest. Left perihilar mass is substantially increased in size. New AP window adenopathy. 2. Stable sclerotic osseous lesions throughout the spine and pelvic girdle. No new focal osseous lesions. 3. Hepatic cirrhosis with numerous subcentimeter low-attenuation liver lesions, too small to characterize, not definitely changed. 4. Large colonic stool volume suggests constipation. 5. Aortic Atherosclerosis (ICD10-I70.0) and Emphysema (ICD10-J43.9). Electronically Signed   By: Ilona Sorrel M.D.   On: 02/14/2019 12:00   Ct Abdomen Pelvis W Contrast  Result Date: 02/14/2019 CLINICAL DATA:  Extensive stage left lung small cell carcinoma diagnosed August 2019 status post chemotherapy and ongoing immunotherapy. Restaging. EXAM: CT CHEST, ABDOMEN, AND PELVIS WITH CONTRAST TECHNIQUE: Multidetector CT imaging of the chest, abdomen and pelvis was performed following the standard protocol during bolus administration of intravenous contrast. CONTRAST:  114mL OMNIPAQUE IOHEXOL 300 MG/ML  SOLN COMPARISON:  12/13/2018 CT chest, abdomen and pelvis. FINDINGS: CT CHEST FINDINGS Cardiovascular: Normal heart size. No significant pericardial effusion/thickening. Left anterior descending and right coronary atherosclerosis. Right internal jugular Port-A-Cath terminates at the cavoatrial junction. Atherosclerotic nonaneurysmal thoracic aorta. Normal caliber pulmonary arteries. No central pulmonary emboli. Mediastinum/Nodes: No discrete thyroid nodules. Unremarkable esophagus. No axillary adenopathy. Newly enlarged 1.6 cm AP window node (series 2/image 20). Stable mildly enlarged 1.1 cm left paratracheal node (series 2/image 21). No right hilar adenopathy.  Infiltrative left perihilar 5.7 x 4.5 cm mass (series 2/image 27), substantially increased from 1.4 x 1.3 cm using similar measurement technique on 12/13/2018 CT. Lungs/Pleura: No pneumothorax. No pleural effusion. Near occlusion of the left upper lobe bronchus, worsened. Worsened segmental bronchus occlusion in the central left lower lobe. Stable subcentimeter calcified superior segment right lower lobe granuloma. Stable 5 mm posterior left upper lobe solid pulmonary nodule (series 4/image 21). Moderate new lingular atelectasis. Slightly worsened subsegmental left lower lobe atelectasis. No additional significant pulmonary nodules. Moderate centrilobular and paraseptal emphysema. Musculoskeletal: Stable scattered sclerotic lesions in the thoracic vertebral bodies, most prominent at T3 and T10. No appreciable new focal osseous lesions in the chest. Mild thoracic spondylosis. CT ABDOMEN PELVIS FINDINGS Hepatobiliary: Diffusely irregular liver surface with relative hypertrophy of the lateral segment left liver lobe, unchanged, compatible with hepatic cirrhosis. Numerous subcentimeter hypodense lesions scattered throughout the liver, too small to characterize, not convincingly changed. Normal gallbladder with no radiopaque cholelithiasis. No biliary ductal dilatation. Pancreas: Normal, with no mass or duct dilation. Spleen: Normal size. No mass. Adrenals/Urinary Tract: Normal adrenals. Normal kidneys with no hydronephrosis and no renal mass. Nonspecific small amount of gas in the nondependent bladder. Otherwise normal bladder. Stomach/Bowel: Normal non-distended stomach. Normal caliber small bowel with no small bowel wall thickening. Normal appendix. Large colonic stool volume. No large bowel wall thickening, significant diverticulosis or acute pericolonic fat stranding. Oral contrast transits to the cecum. Vascular/Lymphatic: Atherosclerotic nonaneurysmal abdominal aorta.  Patent portal, splenic, hepatic and renal  veins. No pathologically enlarged lymph nodes in the abdomen or pelvis. Reproductive: Status post hysterectomy, with no abnormal findings at the vaginal cuff. No adnexal mass. Other: No pneumoperitoneum, ascites or focal fluid collection. Musculoskeletal: Scattered sclerotic lesions in L3 vertebral body and bilateral pelvic girdle are stable, largest 1.7 cm in the medial right iliac bone (series 2/image 92). No appreciable new focal osseous lesions. Stable bone cage in the L4-5 disc space. Mild lumbar spondylosis. IMPRESSION: 1. Progression of metastatic disease in the chest. Left perihilar mass is substantially increased in size. New AP window adenopathy. 2. Stable sclerotic osseous lesions throughout the spine and pelvic girdle. No new focal osseous lesions. 3. Hepatic cirrhosis with numerous subcentimeter low-attenuation liver lesions, too small to characterize, not definitely changed. 4. Large colonic stool volume suggests constipation. 5. Aortic Atherosclerosis (ICD10-I70.0) and Emphysema (ICD10-J43.9). Electronically Signed   By: Ilona Sorrel M.D.   On: 02/14/2019 12:00      IMPRESSION: Extensive stage (T3, N2, M1a)small cell lung cancer.  Patient had recent progression on immunotherapy with significant progression within the left central chest region.  The patient will be a good candidate for short course of palliative radiation therapy directed at this area.  I discussed the overall treatment course side effects and potential toxicities of radiation therapy in the situation with the patient and her husband through phone contact.  Patient wishes to proceed with treatment.  In light of her new onset of  headaches she will also be scheduled for a brain MRI for further evaluation of this issue.  PLAN: CT simulation on April 13 at 11 AM with treatments to begin later that week.  I anticipate between 10 and 15 treatments.  The patient will then be evaluated for additional chemotherapy by Dr. Julien Nordmann.  Will  consider brain radiation therapy if indicated depending on upcoming brain MRI findings.     ------------------------------------------------  Blair Promise, PhD, MD  This document serves as a record of services personally performed by Gery Pray, MD. It was created on his behalf by Rae Lips, a trained medical scribe. The creation of this record is based on the scribe's personal observations and the provider's statements to them. This document has been checked and approved by the attending provider.

## 2019-02-28 ENCOUNTER — Ambulatory Visit (HOSPITAL_COMMUNITY)
Admission: RE | Admit: 2019-02-28 | Discharge: 2019-02-28 | Disposition: A | Discharge status: Home / Self Care | Payer: Medicare Other | Source: Ambulatory Visit | Attending: Radiation Oncology | Admitting: Radiation Oncology

## 2019-02-28 DIAGNOSIS — C3492 Malignant neoplasm of unspecified part of left bronchus or lung: Secondary | ICD-10-CM | POA: Diagnosis not present

## 2019-02-28 DIAGNOSIS — G9389 Other specified disorders of brain: Secondary | ICD-10-CM | POA: Diagnosis not present

## 2019-02-28 MED ORDER — GADOBUTROL 1 MMOL/ML IV SOLN
9.0000 mL | Freq: Once | INTRAVENOUS | Status: AC | PRN
  Administered 2019-02-28: 9 mL via INTRAVENOUS

## 2019-03-03 ENCOUNTER — Other Ambulatory Visit: Payer: Self-pay

## 2019-03-03 ENCOUNTER — Ambulatory Visit
Admission: RE | Admit: 2019-03-03 | Discharge: 2019-03-03 | Disposition: A | Discharge status: Home / Self Care | Payer: Medicare Other | Source: Ambulatory Visit | Attending: Radiation Oncology | Admitting: Radiation Oncology

## 2019-03-03 DIAGNOSIS — C3412 Malignant neoplasm of upper lobe, left bronchus or lung: Secondary | ICD-10-CM | POA: Diagnosis not present

## 2019-03-03 DIAGNOSIS — C3492 Malignant neoplasm of unspecified part of left bronchus or lung: Secondary | ICD-10-CM | POA: Diagnosis not present

## 2019-03-03 DIAGNOSIS — Z510 Encounter for antineoplastic radiation therapy: Secondary | ICD-10-CM | POA: Diagnosis not present

## 2019-03-03 NOTE — Progress Notes (Addendum)
  Radiation Oncology         [phone]) [phone] ________________________________  Name: [name] MRN: [id number]  Date: 03/03/2019  DOB: [birth year]  SIMULATION AND TREATMENT PLANNING NOTE    ICD-10-CM   1. SCLC (small cell lung carcinoma), left (HCC) C34.92     DIAGNOSIS:  Extensive stage (T3, N2, M1a)small cell lung cancer  NARRATIVE:  The patient was brought to the [place].  Identity was confirmed.  All relevant records and images related to the planned course of therapy were reviewed.  The patient freely provided informed written consent to proceed with treatment after reviewing the details related to the planned course of therapy. The consent form was witnessed and verified by the simulation staff.  Then, the patient was set-up in a stable reproducible  supine position for radiation therapy.  CT images were obtained.  Surface markings were placed.  The CT images were loaded into the planning software.  Then the target and avoidance structures were contoured.  Treatment planning then occurred.  The radiation prescription was entered and confirmed.  Then, I designed and supervised the construction of a total of 5 medically necessary complex treatment devices.  I have requested : 3D Simulation  I have requested a DVH of the following structures: GTV, PTV, lungs, heart.  I have ordered:dose calc.  PLAN:  The patient will receive 30 Gy in 10 fractions directed at the left central chest area.  -----------------------------------  Blair Promise, PhD, MD  This document serves as a record of services personally performed by Gery Pray, MD. It was created on his behalf by Wilburn Mylar, a trained medical scribe. The creation of this record is based on the scribe's personal observations and the provider's statements to them. This document has been checked and approved by the attending provider.

## 2019-03-05 ENCOUNTER — Other Ambulatory Visit: Payer: Self-pay | Admitting: Radiation Oncology

## 2019-03-05 MED ORDER — DEXAMETHASONE 4 MG PO TABS: 4.0000 mg | ORAL_TABLET | Freq: Every day | ORAL | 0 refills | Status: DC

## 2019-03-06 ENCOUNTER — Other Ambulatory Visit: Payer: Self-pay | Admitting: Internal Medicine

## 2019-03-06 DIAGNOSIS — Z510 Encounter for antineoplastic radiation therapy: Secondary | ICD-10-CM | POA: Diagnosis not present

## 2019-03-06 DIAGNOSIS — C3492 Malignant neoplasm of unspecified part of left bronchus or lung: Secondary | ICD-10-CM | POA: Diagnosis not present

## 2019-03-06 DIAGNOSIS — C3412 Malignant neoplasm of upper lobe, left bronchus or lung: Secondary | ICD-10-CM | POA: Diagnosis not present

## 2019-03-10 ENCOUNTER — Other Ambulatory Visit: Payer: Medicare Other

## 2019-03-10 ENCOUNTER — Ambulatory Visit
Admission: RE | Admit: 2019-03-10 | Discharge: 2019-03-10 | Disposition: A | Discharge status: Home / Self Care | Payer: Medicare Other | Source: Ambulatory Visit | Attending: Radiation Oncology | Admitting: Radiation Oncology

## 2019-03-10 ENCOUNTER — Other Ambulatory Visit: Payer: Self-pay

## 2019-03-10 ENCOUNTER — Ambulatory Visit: Payer: Medicare Other

## 2019-03-10 ENCOUNTER — Ambulatory Visit: Payer: Medicare Other | Admitting: Radiation Oncology

## 2019-03-10 ENCOUNTER — Ambulatory Visit: Payer: Medicare Other | Admitting: Internal Medicine

## 2019-03-10 DIAGNOSIS — C3492 Malignant neoplasm of unspecified part of left bronchus or lung: Secondary | ICD-10-CM | POA: Diagnosis not present

## 2019-03-10 DIAGNOSIS — Z510 Encounter for antineoplastic radiation therapy: Secondary | ICD-10-CM | POA: Diagnosis not present

## 2019-03-10 DIAGNOSIS — C3412 Malignant neoplasm of upper lobe, left bronchus or lung: Secondary | ICD-10-CM | POA: Diagnosis not present

## 2019-03-10 NOTE — Progress Notes (Signed)
  Radiation Oncology         [phone] ________________________________  Name: [name] MRN: [id number]  Date: 03/10/2019  DOB: [birth year]  Simulation Verification Note    ICD-10-CM   1. SCLC (small cell lung carcinoma), left (HCC) C34.92     Status: outpatient  NARRATIVE: The patient was brought to the treatment unit and placed in the planned treatment position. The clinical setup was verified. Then port films were obtained and uploaded to the radiation oncology medical record software.  The treatment beams were carefully compared against the planned radiation fields. The position location and shape of the radiation fields was reviewed. They targeted volume of tissue appears to be appropriately covered by the radiation beams. Organs at risk appear to be excluded as planned.  Based on my personal review, I approved the simulation verification. The patient's treatment will proceed as planned.  -----------------------------------  Blair Promise, PhD, MD  This document serves as a record of services personally performed by Gery Pray, MD. It was created on his behalf by Wilburn Mylar, a trained medical scribe. The creation of this record is based on the scribe's personal observations and the provider's statements to them. This document has been checked and approved by the attending provider.

## 2019-03-11 ENCOUNTER — Ambulatory Visit
Admission: RE | Admit: 2019-03-11 | Discharge: 2019-03-11 | Disposition: A | Discharge status: Home / Self Care | Payer: Medicare Other | Source: Ambulatory Visit | Attending: Radiation Oncology | Admitting: Radiation Oncology

## 2019-03-11 ENCOUNTER — Other Ambulatory Visit: Payer: Self-pay

## 2019-03-11 ENCOUNTER — Other Ambulatory Visit: Payer: Self-pay | Admitting: Internal Medicine

## 2019-03-11 ENCOUNTER — Ambulatory Visit: Payer: Medicare Other

## 2019-03-11 DIAGNOSIS — Z510 Encounter for antineoplastic radiation therapy: Secondary | ICD-10-CM | POA: Diagnosis not present

## 2019-03-11 DIAGNOSIS — C3492 Malignant neoplasm of unspecified part of left bronchus or lung: Secondary | ICD-10-CM | POA: Diagnosis not present

## 2019-03-11 DIAGNOSIS — C3412 Malignant neoplasm of upper lobe, left bronchus or lung: Secondary | ICD-10-CM | POA: Diagnosis not present

## 2019-03-12 ENCOUNTER — Ambulatory Visit
Admission: RE | Admit: 2019-03-12 | Discharge: 2019-03-12 | Disposition: A | Discharge status: Home / Self Care | Payer: Medicare Other | Source: Ambulatory Visit | Attending: Radiation Oncology | Admitting: Radiation Oncology

## 2019-03-12 ENCOUNTER — Ambulatory Visit: Payer: Medicare Other

## 2019-03-12 DIAGNOSIS — Z510 Encounter for antineoplastic radiation therapy: Secondary | ICD-10-CM | POA: Diagnosis not present

## 2019-03-12 DIAGNOSIS — C3492 Malignant neoplasm of unspecified part of left bronchus or lung: Secondary | ICD-10-CM | POA: Diagnosis not present

## 2019-03-12 DIAGNOSIS — C3412 Malignant neoplasm of upper lobe, left bronchus or lung: Secondary | ICD-10-CM | POA: Diagnosis not present

## 2019-03-13 ENCOUNTER — Other Ambulatory Visit: Payer: Self-pay

## 2019-03-13 ENCOUNTER — Ambulatory Visit
Admission: RE | Admit: 2019-03-13 | Discharge: 2019-03-13 | Disposition: A | Discharge status: Home / Self Care | Payer: Medicare Other | Source: Ambulatory Visit | Attending: Radiation Oncology | Admitting: Radiation Oncology

## 2019-03-13 ENCOUNTER — Other Ambulatory Visit: Payer: Self-pay | Admitting: Radiation Oncology

## 2019-03-13 ENCOUNTER — Ambulatory Visit: Payer: Medicare Other

## 2019-03-13 DIAGNOSIS — Z510 Encounter for antineoplastic radiation therapy: Secondary | ICD-10-CM | POA: Diagnosis not present

## 2019-03-13 DIAGNOSIS — C3412 Malignant neoplasm of upper lobe, left bronchus or lung: Secondary | ICD-10-CM | POA: Diagnosis not present

## 2019-03-13 DIAGNOSIS — C3492 Malignant neoplasm of unspecified part of left bronchus or lung: Secondary | ICD-10-CM | POA: Diagnosis not present

## 2019-03-13 MED ORDER — HYDROCODONE-ACETAMINOPHEN 5-325 MG PO TABS: 1.0000 | ORAL_TABLET | Freq: Three times a day (TID) | ORAL | 0 refills | Status: DC

## 2019-03-13 MED ORDER — HYDROCODONE-ACETAMINOPHEN 5-325 MG PO TABS: 1.0000 | ORAL_TABLET | Freq: Three times a day (TID) | ORAL | 0 refills | Status: AC

## 2019-03-14 ENCOUNTER — Ambulatory Visit: Payer: Medicare Other

## 2019-03-14 ENCOUNTER — Ambulatory Visit
Admission: RE | Admit: 2019-03-14 | Discharge: 2019-03-14 | Disposition: A | Discharge status: Home / Self Care | Payer: Medicare Other | Source: Ambulatory Visit | Attending: Radiation Oncology | Admitting: Radiation Oncology

## 2019-03-14 ENCOUNTER — Other Ambulatory Visit: Payer: Self-pay

## 2019-03-14 DIAGNOSIS — C3412 Malignant neoplasm of upper lobe, left bronchus or lung: Secondary | ICD-10-CM | POA: Diagnosis not present

## 2019-03-14 DIAGNOSIS — C3492 Malignant neoplasm of unspecified part of left bronchus or lung: Secondary | ICD-10-CM | POA: Diagnosis not present

## 2019-03-14 DIAGNOSIS — Z510 Encounter for antineoplastic radiation therapy: Secondary | ICD-10-CM | POA: Diagnosis not present

## 2019-03-17 ENCOUNTER — Other Ambulatory Visit: Payer: Self-pay

## 2019-03-17 ENCOUNTER — Ambulatory Visit
Admission: RE | Admit: 2019-03-17 | Discharge: 2019-03-17 | Disposition: A | Discharge status: Home / Self Care | Payer: Medicare Other | Source: Ambulatory Visit | Attending: Radiation Oncology | Admitting: Radiation Oncology

## 2019-03-17 ENCOUNTER — Telehealth: Payer: Self-pay | Admitting: Cardiology

## 2019-03-17 ENCOUNTER — Ambulatory Visit: Payer: Medicare Other

## 2019-03-17 DIAGNOSIS — Z510 Encounter for antineoplastic radiation therapy: Secondary | ICD-10-CM | POA: Diagnosis not present

## 2019-03-17 DIAGNOSIS — C3492 Malignant neoplasm of unspecified part of left bronchus or lung: Secondary | ICD-10-CM | POA: Diagnosis not present

## 2019-03-17 DIAGNOSIS — C3412 Malignant neoplasm of upper lobe, left bronchus or lung: Secondary | ICD-10-CM | POA: Diagnosis not present

## 2019-03-17 NOTE — Telephone Encounter (Signed)
Mychart, no smartphone, pre reg complete 03/17/19 AF

## 2019-03-17 NOTE — Progress Notes (Signed)
Virtual Visit via Telephone Note   This visit type was conducted due to national recommendations for restrictions regarding the COVID-19 Pandemic (e.g. social distancing) in an effort to limit this patient's exposure and mitigate transmission in our community.  Due to her co-morbid illnesses, this patient is at least at moderate risk for complications without adequate follow up.  This format is felt to be most appropriate for this patient at this time.  The patient did not have access to video technology/had technical difficulties with video requiring transitioning to audio format only (telephone).  All issues noted in this document were discussed and addressed.  No physical exam could be performed with this format.  Please refer to the patient's chart for her  consent to telehealth for [facility].   Evaluation Performed:  Follow-up visit  Date:  03/18/2019   ID:  [name], [name] Jul 17, 1956, MRN [id number]  Patient Location: Home Provider Location: Home  PCP:  [name], MD  Cardiologist:  Minus Breeding, MD  Electrophysiologist:  None   Chief Complaint:  SOB  History of Present Illness:    DEMECIA NORTHWAY is a 63 y.o. female with  History of aortic atherosclerosis.    When I first saw her she had some dyspnea.  I sent her for a POET (Plain Old Exercise Treadmill).  There was no evidence of ischemia bu her heart rate wast not at target. At this point the pretest probability of obstructive CAD was low and I opted not to perform a perfusion study.  Since then she has been found to have small she will lung cancer.  She is being managed for this.  She has had no new cardiovascular complaints.  She is not requiring any oxygen.  She denies any chest pressure, neck or arm discomfort.  She is not having palpitations, presyncope or syncope.  She is had no weight gain or edema.   The patient does not have symptoms concerning for COVID-19 infection (fever, chills, cough, or new  shortness of breath).    Past Medical History:  Diagnosis Date  . Alpha-1-antitrypsin deficiency ([place])   . Cervical cancer ([place]) 2004  . 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 This doesn't fit entirely with emphysema.      . Exposure to hepatitis C 09/12/2016  . Fibromyalgia   . GERD (gastroesophageal reflux disease)   . Hyperlipidemia   . IBS (irritable bowel syndrome)   . Obstructive sleep apnea 06/02/2008   NPSG 06/07/08, AHI 6.9/ hr, weight 220 lbs    . Osteoporosis   . Pulmonary fibrosis, unspecified ([place]) 09/29/2016   CXR 03/28/2016  . Rhinitis, nonallergic 05/08/2012  . 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 Vitamin D deficiency    Past Surgical History:  Procedure Laterality Date  . ABDOMINAL HYSTERECTOMY    . IR IMAGING GUIDED PORT INSERTION  08/02/2018  . LUMBAR DISC SURGERY    . VIDEO BRONCHOSCOPY Bilateral 07/10/2018   Procedure: VIDEO BRONCHOSCOPY WITHOUT FLUORO;  Surgeon: Laurin Coder, MD;  Location: [facility];  Service: Cardiopulmonary;  Laterality: Bilateral;     Current Meds  Medication Sig  . albuterol (PROVENTIL) (2.5 MG/3ML) 0.083% nebulizer solution Take 3 mLs (2.5 mg total) by nebulization every 6 (six) hours as needed.  Marland Kitchen allopurinol (ZYLOPRIM) 300 MG  tablet Take 1 tablet (300 mg total) by mouth daily. Begin on 07/25/2018  . amitriptyline (ELAVIL) 75 MG tablet Take 75 mg by mouth at bedtime.    Marland Kitchen demeclocycline (DECLOMYCIN) 150 MG tablet TAKE  (1)  TABLET TWICE A DAY.  Marland Kitchen dexamethasone (DECADRON) 4 MG tablet Take 1 tablet (4 mg total) by mouth daily.  Marland Kitchen ezetimibe (ZETIA) 10 MG tablet TAKE 1 TABLET ONCE A DAY  . feeding supplement, ENSURE ENLIVE, (ENSURE ENLIVE) LIQD Take 237 mLs by mouth 2 (two) times daily between meals.  . fluticasone (FLONASE)  50 MCG/ACT nasal spray USE 2 SPRAYS IN EACH NOSTRIL ONCE DAILY.  . folic acid (FOLVITE) 1 MG tablet Take 1 mg by mouth daily.  . Glycopyrrolate-Formoterol (BEVESPI AEROSPHERE) 9-4.8 MCG/ACT AERO Inhale 2 puffs into the lungs 2 (two) times daily.  Marland Kitchen HYDROcodone-acetaminophen (NORCO/VICODIN) 5-325 MG tablet Take 1 tablet by mouth 3 (three) times daily.  Marland Kitchen levothyroxine (SYNTHROID) 50 MCG tablet TAKE (1) TABLET DAILY BE- FORE BREAKFAST.  Marland Kitchen lidocaine-prilocaine (EMLA) cream Apply 1 application topically as needed.  . Nebulizers (COMPRESSOR NEBULIZER) MISC 1 Device by Does not apply route as needed.  Marland Kitchen omeprazole (PRILOSEC) 40 MG capsule TAKE (1) CAPSULE DAILY  . potassium chloride SA (K-DUR,KLOR-CON) 20 MEQ tablet Take 1/2 tablet by mouth daily.  Marland Kitchen PROAIR HFA 108 (90 Base) MCG/ACT inhaler 2 PUFFS EVERY 4 HOURS AS NEEDED FOR WHEEZING  . rosuvastatin (CRESTOR) 40 MG tablet Take 0.5 tablets (20 mg total) by mouth daily.     Allergies:   Penicillins; Actonel [risedronate sodium]; Advair diskus [fluticasone-salmeterol]; Alendronate sodium; Aspirin; Azithromycin; Keflex [cephalexin]; Levofloxacin; Morphine; Nsaids; Omnicef [cefdinir]; and Erythromycin   Social History   Tobacco Use  . Smoking status: Former Smoker    Packs/day: 0.50    Types: Cigarettes    Start date: 11/20/1972    Last attempt to quit: 04/01/2012    Years since quitting: 6.9  . Smokeless tobacco: Never Used  Substance Use Topics  . Alcohol use: No  . Drug use: No     Family Hx: The patient's family history includes Alpha-1 antitrypsin deficiency in her sister and another family member; CAD (age of onset: 23) in her brother; CAD (age of onset: 1) in her sister; Heart attack in her brother; Heart attack (age of onset: 96) in her father; Heart disease in her brother; Irregular heart beat in her mother; Leukemia in her brother; Lung cancer in her brother; Migraines in her father; Osteoporosis in her mother, sister, and sister;  Pancreatic cancer (age of onset: 9) in her mother. There is no history of Colon cancer.  ROS:   Please see the history of present illness.    As stated in the HPI and negative for all other systems.   Prior CV studies:   The following studies were reviewed today:  None  Labs/Other Tests and Data Reviewed:    EKG:  No ECG reviewed.  Recent Labs: 07/08/2018: B Natriuretic Peptide 27.4 07/19/2018: Magnesium 1.8 02/17/2019: ALT 42; BUN 6; Creatinine 0.62; Hemoglobin 11.6; Platelet Count 139; Potassium 3.0; Sodium 127; TSH 28.182   Recent Lipid Panel Lab Results  Component Value Date/Time   CHOL 140 07/07/2018 09:22 AM   CHOL 160 05/15/2018 08:21 AM   CHOL 200 (H) 02/13/2013 03:23 PM   TRIG 99 07/07/2018 09:22 AM   TRIG 135 04/05/2017 09:18 AM   TRIG 175 (H) 02/13/2013 03:23 PM   HDL 31 (L) 07/07/2018 09:22 AM   HDL 51 05/15/2018  08:21 AM   HDL 50 04/05/2017 09:18 AM   HDL 49 02/13/2013 03:23 PM   CHOLHDL 4.5 07/07/2018 09:22 AM   LDLCALC 89 07/07/2018 09:22 AM   LDLCALC 86 05/15/2018 08:21 AM   LDLCALC 89 05/26/2014 10:30 AM   LDLCALC 116 (H) 02/13/2013 03:23 PM    Wt Readings from Last 3 Encounters:  03/18/19 191 lb (86.6 kg)  02/27/19 194 lb 9.6 oz (88.3 kg)  02/17/19 198 lb 12.8 oz (90.2 kg)     Objective:    Vital Signs:  Ht 5\' 5"  (1.651 m)   Wt 191 lb (86.6 kg)   BMI 31.78 kg/m    VITAL SIGNS:  reviewed NA  ASSESSMENT & PLAN:    AORTIC ATHEROSCLEROSIS: In the absence of further symptoms no change in therapy or further testing is indicated.  HTN: Her blood pressures well controlled.  She will continue the meds as listed.  DYSLIPDEMIA:  She tolerates the meds as listed.  No change in therapy.   COVID-19 Education: The signs and symptoms of COVID-19 were discussed with the patient and how to seek care for testing (follow up with PCP or arrange E-visit).  The importance of social distancing was discussed today.  Time:   Today, I have spent  minutes with  the patient with telehealth technology discussing the above problems.     Medication Adjustments/Labs and Tests Ordered: Current medicines are reviewed at length with the patient today.  Concerns regarding medicines are outlined above.   Tests Ordered: No orders of the defined types were placed in this encounter.   Medication Changes: No orders of the defined types were placed in this encounter.   Disposition:  Follow up prn  Signed, Minus Breeding, MD  03/18/2019 2:45 PM    [facility]

## 2019-03-18 ENCOUNTER — Other Ambulatory Visit: Payer: Self-pay

## 2019-03-18 ENCOUNTER — Telehealth (INDEPENDENT_AMBULATORY_CARE_PROVIDER_SITE_OTHER): Payer: Medicare Other | Admitting: Cardiology

## 2019-03-18 ENCOUNTER — Encounter: Payer: Self-pay | Admitting: Cardiology

## 2019-03-18 ENCOUNTER — Ambulatory Visit: Payer: Medicare Other

## 2019-03-18 ENCOUNTER — Ambulatory Visit
Admission: RE | Admit: 2019-03-18 | Discharge: 2019-03-18 | Disposition: A | Discharge status: Home / Self Care | Payer: Medicare Other | Source: Ambulatory Visit | Attending: Radiation Oncology | Admitting: Radiation Oncology

## 2019-03-18 VITALS — Ht 65.0 in | Wt 191.0 lb

## 2019-03-18 DIAGNOSIS — Z510 Encounter for antineoplastic radiation therapy: Secondary | ICD-10-CM | POA: Diagnosis not present

## 2019-03-18 DIAGNOSIS — I700 Atherosclerosis of aorta: Secondary | ICD-10-CM

## 2019-03-18 DIAGNOSIS — C3412 Malignant neoplasm of upper lobe, left bronchus or lung: Secondary | ICD-10-CM | POA: Diagnosis not present

## 2019-03-18 DIAGNOSIS — E785 Hyperlipidemia, unspecified: Secondary | ICD-10-CM | POA: Diagnosis not present

## 2019-03-18 DIAGNOSIS — C3492 Malignant neoplasm of unspecified part of left bronchus or lung: Secondary | ICD-10-CM | POA: Diagnosis not present

## 2019-03-18 NOTE — Patient Instructions (Signed)

## 2019-03-19 ENCOUNTER — Ambulatory Visit: Payer: Medicare Other

## 2019-03-19 ENCOUNTER — Ambulatory Visit
Admission: RE | Admit: 2019-03-19 | Discharge: 2019-03-19 | Disposition: A | Discharge status: Home / Self Care | Payer: Medicare Other | Source: Ambulatory Visit | Attending: Radiation Oncology | Admitting: Radiation Oncology

## 2019-03-19 ENCOUNTER — Other Ambulatory Visit: Payer: Self-pay

## 2019-03-19 ENCOUNTER — Telehealth: Payer: Self-pay | Admitting: Family Medicine

## 2019-03-19 DIAGNOSIS — Z510 Encounter for antineoplastic radiation therapy: Secondary | ICD-10-CM | POA: Diagnosis not present

## 2019-03-19 DIAGNOSIS — C3492 Malignant neoplasm of unspecified part of left bronchus or lung: Secondary | ICD-10-CM | POA: Diagnosis not present

## 2019-03-19 DIAGNOSIS — C3412 Malignant neoplasm of upper lobe, left bronchus or lung: Secondary | ICD-10-CM | POA: Diagnosis not present

## 2019-03-20 ENCOUNTER — Ambulatory Visit: Admission: RE | Admit: 2019-03-20 | Payer: Medicare Other | Source: Ambulatory Visit

## 2019-03-20 ENCOUNTER — Other Ambulatory Visit: Payer: Self-pay

## 2019-03-20 ENCOUNTER — Other Ambulatory Visit: Payer: Self-pay | Admitting: Internal Medicine

## 2019-03-20 ENCOUNTER — Ambulatory Visit: Payer: Medicare Other

## 2019-03-20 DIAGNOSIS — C3492 Malignant neoplasm of unspecified part of left bronchus or lung: Secondary | ICD-10-CM | POA: Diagnosis not present

## 2019-03-20 DIAGNOSIS — J189 Pneumonia, unspecified organism: Secondary | ICD-10-CM | POA: Diagnosis not present

## 2019-03-21 ENCOUNTER — Other Ambulatory Visit: Payer: Self-pay

## 2019-03-21 ENCOUNTER — Ambulatory Visit: Payer: Medicare Other

## 2019-03-21 ENCOUNTER — Ambulatory Visit
Admission: RE | Admit: 2019-03-21 | Discharge: 2019-03-21 | Disposition: A | Discharge status: Home / Self Care | Payer: Medicare Other | Source: Ambulatory Visit | Attending: Radiation Oncology | Admitting: Radiation Oncology

## 2019-03-21 DIAGNOSIS — C3492 Malignant neoplasm of unspecified part of left bronchus or lung: Secondary | ICD-10-CM | POA: Diagnosis not present

## 2019-03-21 DIAGNOSIS — C3412 Malignant neoplasm of upper lobe, left bronchus or lung: Secondary | ICD-10-CM | POA: Diagnosis not present

## 2019-03-21 DIAGNOSIS — Z510 Encounter for antineoplastic radiation therapy: Secondary | ICD-10-CM | POA: Diagnosis not present

## 2019-03-24 ENCOUNTER — Ambulatory Visit
Admission: RE | Admit: 2019-03-24 | Discharge: 2019-03-24 | Disposition: A | Discharge status: Home / Self Care | Payer: Medicare Other | Source: Ambulatory Visit | Attending: Radiation Oncology | Admitting: Radiation Oncology

## 2019-03-24 ENCOUNTER — Other Ambulatory Visit: Payer: Self-pay

## 2019-03-24 DIAGNOSIS — Z510 Encounter for antineoplastic radiation therapy: Secondary | ICD-10-CM | POA: Diagnosis not present

## 2019-03-24 DIAGNOSIS — C3492 Malignant neoplasm of unspecified part of left bronchus or lung: Secondary | ICD-10-CM | POA: Diagnosis not present

## 2019-03-24 DIAGNOSIS — C3412 Malignant neoplasm of upper lobe, left bronchus or lung: Secondary | ICD-10-CM | POA: Diagnosis not present

## 2019-03-27 ENCOUNTER — Other Ambulatory Visit: Payer: Self-pay | Admitting: Internal Medicine

## 2019-03-31 ENCOUNTER — Other Ambulatory Visit: Payer: Medicare Other

## 2019-03-31 ENCOUNTER — Ambulatory Visit: Payer: Medicare Other

## 2019-03-31 ENCOUNTER — Ambulatory Visit: Payer: Medicare Other | Admitting: Internal Medicine

## 2019-04-03 ENCOUNTER — Other Ambulatory Visit: Payer: Self-pay

## 2019-04-03 MED ORDER — DEXAMETHASONE 4 MG PO TABS: 4.0000 mg | ORAL_TABLET | Freq: Every day | ORAL | 0 refills | Status: DC

## 2019-04-04 ENCOUNTER — Inpatient Hospital Stay: Payer: Medicare Other | Attending: Internal Medicine

## 2019-04-04 ENCOUNTER — Other Ambulatory Visit: Payer: Self-pay

## 2019-04-04 ENCOUNTER — Other Ambulatory Visit: Payer: Self-pay | Admitting: Physician Assistant

## 2019-04-04 ENCOUNTER — Ambulatory Visit (HOSPITAL_COMMUNITY)
Admission: RE | Admit: 2019-04-04 | Discharge: 2019-04-04 | Disposition: A | Discharge status: Home / Self Care | Payer: Medicare Other | Source: Ambulatory Visit | Attending: Internal Medicine | Admitting: Internal Medicine

## 2019-04-04 ENCOUNTER — Encounter (HOSPITAL_COMMUNITY): Payer: Self-pay

## 2019-04-04 ENCOUNTER — Telehealth: Payer: Self-pay | Admitting: *Deleted

## 2019-04-04 DIAGNOSIS — Z87891 Personal history of nicotine dependence: Secondary | ICD-10-CM | POA: Diagnosis not present

## 2019-04-04 DIAGNOSIS — J8410 Pulmonary fibrosis, unspecified: Secondary | ICD-10-CM | POA: Diagnosis not present

## 2019-04-04 DIAGNOSIS — E876 Hypokalemia: Secondary | ICD-10-CM | POA: Diagnosis not present

## 2019-04-04 DIAGNOSIS — E559 Vitamin D deficiency, unspecified: Secondary | ICD-10-CM | POA: Insufficient documentation

## 2019-04-04 DIAGNOSIS — J449 Chronic obstructive pulmonary disease, unspecified: Secondary | ICD-10-CM | POA: Diagnosis not present

## 2019-04-04 DIAGNOSIS — Z8541 Personal history of malignant neoplasm of cervix uteri: Secondary | ICD-10-CM | POA: Insufficient documentation

## 2019-04-04 DIAGNOSIS — C3412 Malignant neoplasm of upper lobe, left bronchus or lung: Secondary | ICD-10-CM | POA: Insufficient documentation

## 2019-04-04 DIAGNOSIS — C3492 Malignant neoplasm of unspecified part of left bronchus or lung: Secondary | ICD-10-CM

## 2019-04-04 DIAGNOSIS — R5382 Chronic fatigue, unspecified: Secondary | ICD-10-CM

## 2019-04-04 DIAGNOSIS — Z5112 Encounter for antineoplastic immunotherapy: Secondary | ICD-10-CM | POA: Insufficient documentation

## 2019-04-04 DIAGNOSIS — E785 Hyperlipidemia, unspecified: Secondary | ICD-10-CM | POA: Insufficient documentation

## 2019-04-04 DIAGNOSIS — Z79899 Other long term (current) drug therapy: Secondary | ICD-10-CM | POA: Insufficient documentation

## 2019-04-04 DIAGNOSIS — C3490 Malignant neoplasm of unspecified part of unspecified bronchus or lung: Secondary | ICD-10-CM | POA: Diagnosis not present

## 2019-04-04 DIAGNOSIS — M797 Fibromyalgia: Secondary | ICD-10-CM | POA: Diagnosis not present

## 2019-04-04 DIAGNOSIS — K589 Irritable bowel syndrome without diarrhea: Secondary | ICD-10-CM | POA: Diagnosis not present

## 2019-04-04 DIAGNOSIS — C7951 Secondary malignant neoplasm of bone: Secondary | ICD-10-CM | POA: Insufficient documentation

## 2019-04-04 DIAGNOSIS — I10 Essential (primary) hypertension: Secondary | ICD-10-CM | POA: Insufficient documentation

## 2019-04-04 DIAGNOSIS — C787 Secondary malignant neoplasm of liver and intrahepatic bile duct: Secondary | ICD-10-CM | POA: Diagnosis not present

## 2019-04-04 DIAGNOSIS — K219 Gastro-esophageal reflux disease without esophagitis: Secondary | ICD-10-CM | POA: Insufficient documentation

## 2019-04-04 DIAGNOSIS — C7800 Secondary malignant neoplasm of unspecified lung: Secondary | ICD-10-CM | POA: Insufficient documentation

## 2019-04-04 DIAGNOSIS — E871 Hypo-osmolality and hyponatremia: Secondary | ICD-10-CM | POA: Diagnosis not present

## 2019-04-04 LAB — CMP (CANCER CENTER ONLY)
ALT: 37 U/L (ref 0–44)
AST: 43 U/L — ABNORMAL HIGH (ref 15–41)
Albumin: 3.6 g/dL (ref 3.5–5.0)
Alkaline Phosphatase: 180 U/L — ABNORMAL HIGH (ref 38–126)
Anion gap: 9 (ref 5–15)
BUN: 9 mg/dL (ref 6–20)
CO2: 32 mmol/L (ref 22–32)
Calcium: 9 mg/dL (ref 8.9–10.3)
Chloride: 89 mmol/L — ABNORMAL LOW (ref 98–111)
Creatinine: 0.75 mg/dL (ref 0.44–1.00)
GFR, Est AFR Am: >60 mL/min (ref >60)
GFR, Estimated: >60 mL/min (ref >60)
Glucose, Bld: 105 mg/dL — ABNORMAL HIGH (ref 70–99)
Potassium: 2.9 mmol/L — CL (ref 3.5–5.1)
Sodium: 130 mmol/L — ABNORMAL LOW (ref 135–145)
Total Bilirubin: 0.4 mg/dL (ref 0.3–1.2)
Total Protein: 7.6 g/dL (ref 6.5–8.1)

## 2019-04-04 LAB — CBC WITH DIFFERENTIAL (CANCER CENTER ONLY)
Abs Immature Granulocytes: 0.01 10*3/uL (ref 0.00–0.07)
Basophils Absolute: 0 10*3/uL (ref 0.0–0.1)
Basophils Relative: 1 %
Eosinophils Absolute: 0.1 10*3/uL (ref 0.0–0.5)
Eosinophils Relative: 2 %
HCT: 36.5 % (ref 36.0–46.0)
Hemoglobin: 12.1 g/dL (ref 12.0–15.0)
Immature Granulocytes: 0 %
Lymphocytes Relative: 13 %
Lymphs Abs: 0.5 10*3/uL — ABNORMAL LOW (ref 0.7–4.0)
MCH: 29.7 pg (ref 26.0–34.0)
MCHC: 33.2 g/dL (ref 30.0–36.0)
MCV: 89.7 fL (ref 80.0–100.0)
Monocytes Absolute: 0.5 10*3/uL (ref 0.1–1.0)
Monocytes Relative: 14 %
Neutro Abs: 2.6 10*3/uL (ref 1.7–7.7)
Neutrophils Relative %: 70 %
Platelet Count: 105 10*3/uL — ABNORMAL LOW (ref 150–400)
RBC: 4.07 MIL/uL (ref 3.87–5.11)
RDW: 17.5 % — ABNORMAL HIGH (ref 11.5–15.5)
WBC Count: 3.8 10*3/uL — ABNORMAL LOW (ref 4.0–10.5)
nRBC: 0 % (ref 0.0–0.2)

## 2019-04-04 LAB — TSH: TSH: 30.122 u[IU]/mL — ABNORMAL HIGH (ref 0.308–3.960)

## 2019-04-04 MED ORDER — POTASSIUM CHLORIDE CRYS ER 20 MEQ PO TBCR: 20.0000 meq | EXTENDED_RELEASE_TABLET | Freq: Two times a day (BID) | ORAL | 0 refills | Status: DC

## 2019-04-04 MED ORDER — IOHEXOL 300 MG/ML  SOLN
100.0000 mL | Freq: Once | INTRAMUSCULAR | Status: AC | PRN
  Administered 2019-04-04: 100 mL via INTRAVENOUS

## 2019-04-04 MED ORDER — SODIUM CHLORIDE (PF) 0.9 % IJ SOLN
INTRAMUSCULAR | Status: AC
  Filled 2019-04-04: qty 50

## 2019-04-04 NOTE — Telephone Encounter (Signed)
TCT patient. There was no answer but was able to leave message for pt to call back regarding K+ of 2.9 today.  Replacement K+ has been called in to her pharmacy in [place]. Asked patient to return call today @ [phone]

## 2019-04-07 ENCOUNTER — Telehealth: Payer: Self-pay

## 2019-04-07 ENCOUNTER — Other Ambulatory Visit: Payer: Self-pay

## 2019-04-07 ENCOUNTER — Inpatient Hospital Stay (HOSPITAL_BASED_OUTPATIENT_CLINIC_OR_DEPARTMENT_OTHER): Payer: Medicare Other | Admitting: Internal Medicine

## 2019-04-07 ENCOUNTER — Encounter: Payer: Self-pay | Admitting: Radiation Oncology

## 2019-04-07 ENCOUNTER — Encounter: Payer: Self-pay | Admitting: Internal Medicine

## 2019-04-07 VITALS — BP 157/90 | HR 89 | Temp 98.7°F | Resp 18 | Ht 65.0 in | Wt 187.6 lb

## 2019-04-07 DIAGNOSIS — C787 Secondary malignant neoplasm of liver and intrahepatic bile duct: Secondary | ICD-10-CM

## 2019-04-07 DIAGNOSIS — C7800 Secondary malignant neoplasm of unspecified lung: Secondary | ICD-10-CM

## 2019-04-07 DIAGNOSIS — C7951 Secondary malignant neoplasm of bone: Secondary | ICD-10-CM

## 2019-04-07 DIAGNOSIS — Z79899 Other long term (current) drug therapy: Secondary | ICD-10-CM

## 2019-04-07 DIAGNOSIS — E785 Hyperlipidemia, unspecified: Secondary | ICD-10-CM | POA: Diagnosis not present

## 2019-04-07 DIAGNOSIS — J8410 Pulmonary fibrosis, unspecified: Secondary | ICD-10-CM | POA: Diagnosis not present

## 2019-04-07 DIAGNOSIS — I10 Essential (primary) hypertension: Secondary | ICD-10-CM

## 2019-04-07 DIAGNOSIS — J449 Chronic obstructive pulmonary disease, unspecified: Secondary | ICD-10-CM | POA: Diagnosis not present

## 2019-04-07 DIAGNOSIS — E559 Vitamin D deficiency, unspecified: Secondary | ICD-10-CM | POA: Diagnosis not present

## 2019-04-07 DIAGNOSIS — Z5112 Encounter for antineoplastic immunotherapy: Secondary | ICD-10-CM | POA: Diagnosis not present

## 2019-04-07 DIAGNOSIS — E876 Hypokalemia: Secondary | ICD-10-CM | POA: Diagnosis not present

## 2019-04-07 DIAGNOSIS — Z87891 Personal history of nicotine dependence: Secondary | ICD-10-CM | POA: Diagnosis not present

## 2019-04-07 DIAGNOSIS — K589 Irritable bowel syndrome without diarrhea: Secondary | ICD-10-CM | POA: Diagnosis not present

## 2019-04-07 DIAGNOSIS — C3412 Malignant neoplasm of upper lobe, left bronchus or lung: Secondary | ICD-10-CM | POA: Diagnosis not present

## 2019-04-07 DIAGNOSIS — M797 Fibromyalgia: Secondary | ICD-10-CM | POA: Diagnosis not present

## 2019-04-07 DIAGNOSIS — Z5111 Encounter for antineoplastic chemotherapy: Secondary | ICD-10-CM

## 2019-04-07 DIAGNOSIS — E871 Hypo-osmolality and hyponatremia: Secondary | ICD-10-CM

## 2019-04-07 DIAGNOSIS — C3492 Malignant neoplasm of unspecified part of left bronchus or lung: Secondary | ICD-10-CM

## 2019-04-07 DIAGNOSIS — K219 Gastro-esophageal reflux disease without esophagitis: Secondary | ICD-10-CM | POA: Diagnosis not present

## 2019-04-07 MED ORDER — LEVOTHYROXINE SODIUM 75 MCG PO TABS: 75.0000 ug | ORAL_TABLET | Freq: Every day | ORAL | 1 refills | Status: DC

## 2019-04-07 NOTE — Progress Notes (Signed)
  Radiation Oncology         [phone] ________________________________  Name: [name] MRN: [id number]  Date: 04/07/2019  DOB: 30-Jan-1956  End of Treatment Note  Diagnosis:   Extensive stage (T3, N2, M1a)small cell lung cancer     Indication for treatment:  Palliative       Radiation treatment dates:   4/21-03/24/19  Site/dose:  1. Left lung; 30 Gy in 10 fractions of 3 Gy          2. Brain; 30 Gy in 10 fractions of 3 Gy  Beams/energy:  1. Photon 3D; 6X       2. Photon Isodose; 6X  Narrative: The patient tolerated radiation treatment relatively well.     At the beginning of treatment, pt reported moderate pain and fatigue as well as occasional cough and SOB on exertion. Overall the pt was without complaints.   Plan: The patient has completed radiation treatment. The patient will return to radiation oncology clinic for routine followup in one month. I advised them to call or return sooner if they have any questions or concerns related to their recovery or treatment.  -----------------------------------  Blair Promise, PhD, MD  This document serves as a record of services personally performed by Gery Pray, MD. It was created on his behalf by Mary-Margaret Loma Messing, a trained medical scribe. The creation of this record is based on the scribe's personal observations and the provider's statements to them. This document has been checked and approved by the attending provider.

## 2019-04-07 NOTE — Progress Notes (Signed)
DISCONTINUE ON PATHWAY REGIMEN - Small Cell Lung     Cycles 1 through 4, every 21 days:     Atezolizumab      Carboplatin      Etoposide    Cycles 5 and beyond, every 21 days:     Atezolizumab   **Always confirm dose/schedule in your pharmacy ordering system**  REASON: Disease Progression PRIOR TREATMENT: [id number]: Atezolizumab 1200 mg D1 + Carboplatin AUC=5 D1 + Etoposide 100 mg/m2 D1-3 q21 Days x 4 Cycles, Followed by Atezolizumab 1200 mg Maintenance Until Progression or Unacceptable Toxicity TREATMENT RESPONSE: Progressive Disease (PD)    Patient Characteristics: Relapsed or Progressive Disease, Second Line, Relapse < 3 Months Therapeutic Status: Relapsed or Progressive Disease Line of Therapy: Second Line Time to Relapse: Relapse < 3 Months

## 2019-04-07 NOTE — Telephone Encounter (Signed)
Attempted to contact pt to convey that there are no new orders for pt to resume Decadron. Refill had been in the event her taper was not finished. Left detailed VM with this RN's direct number for return call, questions/concerns.Loma Sousa, RN BSN

## 2019-04-07 NOTE — Progress Notes (Signed)
[facility] Telephone:([phone]) [phone]   [phone]  OFFICE PROGRESS NOTE  [name], MD [place] [address]  DIAGNOSIS: Extensive stage (T3, N2, M1a)  small cell lung cancer diagnosed in August 2019 and presented with large left hilar/subhilar mass in addition to mediastinal lymphadenopathy and contracture and mass in the right upper lobe.  PRIOR THERAPY:  1) Systemic therapy with carboplatin for AUC of 5 on day 1, etoposide 100 mg/M2 on days 1, 2 and 3 as well as Tecentriq (Atezolizumab) 1200 mg IV every 3 weeks with Neulasta support.  Status post 10 cycles.  Starting from cycle #7, the patient will be treated with only single agent immunotherapy with Tecentriq 1200 mg IV every 3 weeks.  Last dose was given January 27, 2019 discontinued secondary to disease progression. 2) palliative radiotherapy to the mediastinal lymph nodes.  CURRENT THERAPY: Systemic chemotherapy with cisplatin 30 mg/M2 and irinotecan 65 mg/M2 every 3 weeks.  First dose Apr 16, 2019.  INTERVAL HISTORY: [name] 63 y.o. female returns to the clinic today for follow-up visit.  The patient is feeling fine today with no concerning complaints except for low back pain.  She denied having any chest pain, shortness of breath, cough or hemoptysis.  She denied having any fever or chills.  She has no nausea, vomiting, diarrhea but has constipation.  The patient denied having any recent weight loss or night sweats.  She has no headache or visual changes.  She had repeat CT scan of the chest, abdomen pelvis performed recently and she is here for evaluation and discussion of her scan results.  MEDICAL HISTORY: Past Medical History:  Diagnosis Date  . Alpha-1-antitrypsin deficiency ([place])   . Cervical cancer ([place]) 2004  . 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 This doesn't fit entirely with emphysema.      . Exposure to hepatitis C 09/12/2016  . Fibromyalgia   . GERD (gastroesophageal reflux disease)   . Hyperlipidemia   . IBS (irritable bowel syndrome)   . Obstructive sleep apnea 06/02/2008   NPSG 06/07/08, AHI 6.9/ hr, weight 220 lbs    . Osteoporosis   . Pulmonary fibrosis, unspecified ([place]) 09/29/2016   CXR 03/28/2016  . Rhinitis, nonallergic 05/08/2012  . 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 Vitamin D deficiency     ALLERGIES:  is allergic to penicillins; actonel [risedronate sodium]; advair diskus [fluticasone-salmeterol]; alendronate sodium; aspirin; azithromycin; keflex [cephalexin]; levofloxacin; morphine; nsaids; omnicef [cefdinir]; and erythromycin.  MEDICATIONS:  Current Outpatient Medications  Medication Sig Dispense Refill  . albuterol (PROVENTIL) (2.5 MG/3ML) 0.083% nebulizer solution Take 3 mLs (2.5 mg total) by nebulization every 6 (six) hours as needed. 90 mL 4  . allopurinol (ZYLOPRIM) 300 MG tablet Take 1 tablet (300 mg total) by mouth daily. Begin on 07/25/2018 14 tablet 0  . amitriptyline (ELAVIL) 75 MG tablet Take 75 mg by mouth at bedtime.      Marland Kitchen demeclocycline (DECLOMYCIN) 150 MG tablet TAKE  (1)  TABLET TWICE A DAY. 60 tablet 0  . dexamethasone (DECADRON) 4 MG tablet Take 1 tablet (4 mg total) by mouth daily. 30 tablet 0  . ezetimibe (ZETIA) 10 MG tablet TAKE 1 TABLET ONCE A DAY 90 tablet 0  . feeding  supplement, ENSURE ENLIVE, (ENSURE ENLIVE) LIQD Take 237 mLs by mouth 2 (two) times daily between meals. 60 Bottle 0  . fluticasone (FLONASE) 50 MCG/ACT nasal spray USE 2 SPRAYS IN EACH NOSTRIL ONCE DAILY. 16 g 5  . folic acid (FOLVITE) 1 MG tablet Take 1 mg by mouth daily.    . Glycopyrrolate-Formoterol (BEVESPI AEROSPHERE) 9-4.8 MCG/ACT AERO Inhale 2 puffs into the lungs 2 (two) times daily. 1 Inhaler 12  . HYDROcodone-acetaminophen  (NORCO/VICODIN) 5-325 MG tablet Take 1 tablet by mouth 3 (three) times daily. 30 tablet 0  . levothyroxine (SYNTHROID) 50 MCG tablet TAKE (1) TABLET DAILY BE- FORE BREAKFAST. 30 tablet 0  . lidocaine-prilocaine (EMLA) cream Apply 1 application topically as needed. 30 g 0  . Nebulizers (COMPRESSOR NEBULIZER) MISC 1 Device by Does not apply route as needed. 1 each prn  . omeprazole (PRILOSEC) 40 MG capsule TAKE (1) CAPSULE DAILY 90 capsule 0  . potassium chloride SA (K-DUR) 20 MEQ tablet Take 1 tablet (20 mEq total) by mouth 2 (two) times daily. 14 tablet 0  . PROAIR HFA 108 (90 Base) MCG/ACT inhaler 2 PUFFS EVERY 4 HOURS AS NEEDED FOR WHEEZING 8.5 g 0  . rosuvastatin (CRESTOR) 40 MG tablet Take 0.5 tablets (20 mg total) by mouth daily. 45 tablet 3   No current facility-administered medications for this visit.     SURGICAL HISTORY:  Past Surgical History:  Procedure Laterality Date  . ABDOMINAL HYSTERECTOMY    . IR IMAGING GUIDED PORT INSERTION  08/02/2018  . LUMBAR DISC SURGERY    . VIDEO BRONCHOSCOPY Bilateral 07/10/2018   Procedure: VIDEO BRONCHOSCOPY WITHOUT FLUORO;  Surgeon: Laurin Coder, MD;  Location: [facility];  Service: Cardiopulmonary;  Laterality: Bilateral;    REVIEW OF SYSTEMS:  Constitutional: negative Eyes: negative Ears, nose, mouth, throat, and face: negative Respiratory: negative Cardiovascular: negative Gastrointestinal: positive for constipation Genitourinary:negative Integument/breast: negative Hematologic/lymphatic: negative Musculoskeletal:positive for back pain Neurological: negative Behavioral/Psych: negative Endocrine: negative Allergic/Immunologic: negative   PHYSICAL EXAMINATION: General appearance: alert, cooperative and no distress Head: Normocephalic, without obvious abnormality, atraumatic Neck: no adenopathy, no JVD, supple, symmetrical, trachea midline and thyroid not enlarged, symmetric, no tenderness/mass/nodules Lymph nodes: Cervical,  supraclavicular, and axillary nodes normal. Resp: clear to auscultation bilaterally Back: symmetric, no curvature. ROM normal. No CVA tenderness. Cardio: regular rate and rhythm, S1, S2 normal, no murmur, click, rub or gallop GI: soft, non-tender; bowel sounds normal; no masses,  no organomegaly Extremities: extremities normal, atraumatic, no cyanosis or edema Neurologic: Alert and oriented X 3, normal strength and tone. Normal symmetric reflexes. Normal coordination and gait  ECOG PERFORMANCE STATUS: 1 - Symptomatic but completely ambulatory  Blood pressure (!) 157/90, pulse 89, temperature 98.7 F (37.1 C), temperature source Oral, resp. rate 18, height 5\' 5"  (1.651 m), weight 187 lb 9.6 oz (85.1 kg), SpO2 95 %.  LABORATORY DATA: Lab Results  Component Value Date   WBC 3.8 (L) 04/04/2019   HGB 12.1 04/04/2019   HCT 36.5 04/04/2019   MCV 89.7 04/04/2019   PLT 105 (L) 04/04/2019      Chemistry      Component Value Date/Time   NA 130 (L) 04/04/2019 0846   NA 124 (L) 07/16/2018 0853   K 2.9 (LL) 04/04/2019 0846   CL 89 (L) 04/04/2019 0846   CO2 32 04/04/2019 0846   BUN 9 04/04/2019 0846   BUN 8 07/16/2018 0853   CREATININE 0.75 04/04/2019 0846   CREATININE 0.68 02/13/2013 1523  Component Value Date/Time   CALCIUM 9.0 04/04/2019 0846   ALKPHOS 180 (H) 04/04/2019 0846   AST 43 (H) 04/04/2019 0846   ALT 37 04/04/2019 0846   BILITOT 0.4 04/04/2019 0846       RADIOGRAPHIC STUDIES: Ct Chest W Contrast  Result Date: 04/04/2019 CLINICAL DATA:  Small cell lung cancer. Immunotherapy ongoing. Radiation therapy complete. EXAM: CT CHEST, ABDOMEN, AND PELVIS WITH CONTRAST TECHNIQUE: Multidetector CT imaging of the chest, abdomen and pelvis was performed following the standard protocol during bolus administration of intravenous contrast. CONTRAST:  173mL OMNIPAQUE IOHEXOL 300 MG/ML  SOLN COMPARISON:  CT 02/14/2019, 12/13/2018, PET-CT 07/25/2018 FINDINGS: CT CHEST FINDINGS  Cardiovascular: Port in the anterior chest wall with tip in distal SVC. The some encroachment on the LEFT lobe pulmonary artery from the LEFT hilar mass similar to prior. No pericardial effusion Mediastinum/Nodes: No axillary or supraclavicular adenopathy. The large hilar mass surrounding the left upper lobe bronchus and left lower lobe pulmonary artery and left lower lobe bronchus is decreased significantly in size. Lesion difficult to define the borders but measures approximately 3.5 by 2.0 cm decreased from 5.6 x 4.5 cm. Lungs/Pleura: atelectasis remains in the lingula.  No new pulmonary nodules. Musculoskeletal: No aggressive osseous lesion. CT ABDOMEN AND PELVIS FINDINGS Hepatobiliary: Liver has a nodular contour. Several low-density lesions in liver are noted including 13 mm lesion the lateral LEFT hepatic lobe (image 63/2.). Lesion in the central RIGHT hepatic lobe measuring 13 mm. Multiple hypermetabolic metastasis on PET-CT scan 07/25/2018. No clear evidence progression. Enlarged periportal lymph nodes measure up to 20 mm short axis increased from 7 mm. Pancreas: Pancreas is normal. No ductal dilatation. No pancreatic inflammation. Spleen: Normal spleen Adrenals/urinary tract: Adrenal glands and kidneys are normal. The ureters and bladder normal. Stomach/Bowel: Stomach, small bowel, appendix, and cecum are normal. The colon and rectosigmoid colon are normal. Vascular/Lymphatic: Abdominal aorta normal caliber. There in the interval increase in adenopathy the upper abdomen. Lymph node in the gastrohepatic ligament measures 12 mm increased from 5 mm on prior. Several lymph nodes adjacent to the celiac trunk are increased. Reproductive: Post hysterectomy Other: No peritoneal metastasis Musculoskeletal: Stable sclerotic lesion at L3. Additional more subtle sclerotic lesions at T10 and T3. IMPRESSION: Chest Impression: 1. Marked improvement in volume LEFT hilar mass following radiation therapy. 2. No evidence of  disease progression in thorax. 3. Abdomen / Pelvis Impression: 1. Interval enlargement of periportal and upper abdominal lymph nodes. Concern for progression of small cell lung cancer. 2. Several hepatic lesions are more conspicuous however there were multiple lesions in the liver on comparison PET-CT scan of 07/25/2018. Recommend attention on follow-up. 3. Nodule liver consistent with cirrhosis. 4. Stable sclerotic lesion in the spine. Electronically Signed   By: Suzy Bouchard M.D.   On: 04/04/2019 15:09   Ct Abdomen Pelvis W Contrast  Result Date: 04/04/2019 CLINICAL DATA:  Small cell lung cancer. Immunotherapy ongoing. Radiation therapy complete. EXAM: CT CHEST, ABDOMEN, AND PELVIS WITH CONTRAST TECHNIQUE: Multidetector CT imaging of the chest, abdomen and pelvis was performed following the standard protocol during bolus administration of intravenous contrast. CONTRAST:  124mL OMNIPAQUE IOHEXOL 300 MG/ML  SOLN COMPARISON:  CT 02/14/2019, 12/13/2018, PET-CT 07/25/2018 FINDINGS: CT CHEST FINDINGS Cardiovascular: Port in the anterior chest wall with tip in distal SVC. The some encroachment on the LEFT lobe pulmonary artery from the LEFT hilar mass similar to prior. No pericardial effusion Mediastinum/Nodes: No axillary or supraclavicular adenopathy. The large hilar mass surrounding the left upper lobe  bronchus and left lower lobe pulmonary artery and left lower lobe bronchus is decreased significantly in size. Lesion difficult to define the borders but measures approximately 3.5 by 2.0 cm decreased from 5.6 x 4.5 cm. Lungs/Pleura: atelectasis remains in the lingula.  No new pulmonary nodules. Musculoskeletal: No aggressive osseous lesion. CT ABDOMEN AND PELVIS FINDINGS Hepatobiliary: Liver has a nodular contour. Several low-density lesions in liver are noted including 13 mm lesion the lateral LEFT hepatic lobe (image 63/2.). Lesion in the central RIGHT hepatic lobe measuring 13 mm. Multiple hypermetabolic  metastasis on PET-CT scan 07/25/2018. No clear evidence progression. Enlarged periportal lymph nodes measure up to 20 mm short axis increased from 7 mm. Pancreas: Pancreas is normal. No ductal dilatation. No pancreatic inflammation. Spleen: Normal spleen Adrenals/urinary tract: Adrenal glands and kidneys are normal. The ureters and bladder normal. Stomach/Bowel: Stomach, small bowel, appendix, and cecum are normal. The colon and rectosigmoid colon are normal. Vascular/Lymphatic: Abdominal aorta normal caliber. There in the interval increase in adenopathy the upper abdomen. Lymph node in the gastrohepatic ligament measures 12 mm increased from 5 mm on prior. Several lymph nodes adjacent to the celiac trunk are increased. Reproductive: Post hysterectomy Other: No peritoneal metastasis Musculoskeletal: Stable sclerotic lesion at L3. Additional more subtle sclerotic lesions at T10 and T3. IMPRESSION: Chest Impression: 1. Marked improvement in volume LEFT hilar mass following radiation therapy. 2. No evidence of disease progression in thorax. 3. Abdomen / Pelvis Impression: 1. Interval enlargement of periportal and upper abdominal lymph nodes. Concern for progression of small cell lung cancer. 2. Several hepatic lesions are more conspicuous however there were multiple lesions in the liver on comparison PET-CT scan of 07/25/2018. Recommend attention on follow-up. 3. Nodule liver consistent with cirrhosis. 4. Stable sclerotic lesion in the spine. Electronically Signed   By: Suzy Bouchard M.D.   On: 04/04/2019 15:09    ASSESSMENT AND PLAN: This is a very pleasant 63 years old white female recently diagnosed with extensive stage small cell lung cancer.  The patient had a recent PET scan that showed multiple metastatic disease in the lung, liver as well as bone. The patient is currently on systemic chemotherapy with carboplatin, etoposide and Tecentriq status post 6 cycles. She tolerated the induction phase of her  treatment well with no concerning complaints. The patient was started on maintenance treatment with single agent Tecentriq status post 4 cycles. She has been tolerating this treatment well with no concerning adverse effects. Her treatment was discontinued recently secondary to disease progression.  She underwent palliative radiotherapy to the progressive disease in the lung and mediastinum. The patient had repeat CT scan of the chest, abdomen and pelvis performed recently.  I personally and independently reviewed the scans and discussed the results with the patient today. Her scan showed improvement of the mediastinal lymphadenopathy and the disease in the chest but there was evidence for disease progression in the liver as well as abdominal lymphadenopathy. I had a lengthy discussion with the patient about her current condition and treatment options.  She was given the option of palliative care versus palliative systemic chemotherapy with reduced dose cisplatin 30 mg/M2 and irinotecan 65 mg/M2 on days 1 and 8 every 3 weeks. The patient is interested in treatment and I discussed with her the adverse effect of this treatment including but not limited to alopecia, myelosuppression, nausea and vomiting, peripheral neuropathy, liver or renal dysfunction. She is expected to start the first cycle of this treatment on 07/17/2019. For the hypothyroidism, I  will adjust her dose and continue to monitor her TSH closely. The patient will come back for follow-up visit in 4 weeks for evaluation with the start of cycle #2. She was advised to call immediately if she has any concerning symptoms in the interval. The patient voices understanding of current disease status and treatment options and is in agreement with the current care plan. All questions were answered. The patient knows to call the clinic with any problems, questions or concerns. We can certainly see the patient much sooner if necessary.  Disclaimer:  This note was dictated with voice recognition software. Similar sounding words can inadvertently be transcribed and may not be corrected upon review.

## 2019-04-07 NOTE — Progress Notes (Signed)
Decadron . Does she need to restart decadron? Decadron refill was sent to her pharmacy on Friday.  Decadron taper completed last week. She denies headaches.  . She said a   Message sent to [facility].

## 2019-04-08 ENCOUNTER — Telehealth: Payer: Self-pay | Admitting: *Deleted

## 2019-04-08 ENCOUNTER — Encounter: Payer: Self-pay | Admitting: Family Medicine

## 2019-04-08 ENCOUNTER — Ambulatory Visit (INDEPENDENT_AMBULATORY_CARE_PROVIDER_SITE_OTHER): Payer: Medicare Other | Admitting: Family Medicine

## 2019-04-08 DIAGNOSIS — C3492 Malignant neoplasm of unspecified part of left bronchus or lung: Secondary | ICD-10-CM

## 2019-04-08 DIAGNOSIS — R29898 Other symptoms and signs involving the musculoskeletal system: Secondary | ICD-10-CM | POA: Insufficient documentation

## 2019-04-08 DIAGNOSIS — I700 Atherosclerosis of aorta: Secondary | ICD-10-CM

## 2019-04-08 DIAGNOSIS — C7951 Secondary malignant neoplasm of bone: Secondary | ICD-10-CM

## 2019-04-08 DIAGNOSIS — E8801 Alpha-1-antitrypsin deficiency: Secondary | ICD-10-CM

## 2019-04-08 DIAGNOSIS — J449 Chronic obstructive pulmonary disease, unspecified: Secondary | ICD-10-CM

## 2019-04-08 DIAGNOSIS — J8410 Pulmonary fibrosis, unspecified: Secondary | ICD-10-CM | POA: Diagnosis not present

## 2019-04-08 DIAGNOSIS — M797 Fibromyalgia: Secondary | ICD-10-CM

## 2019-04-08 DIAGNOSIS — J42 Unspecified chronic bronchitis: Secondary | ICD-10-CM

## 2019-04-08 DIAGNOSIS — E782 Mixed hyperlipidemia: Secondary | ICD-10-CM

## 2019-04-08 DIAGNOSIS — M5410 Radiculopathy, site unspecified: Secondary | ICD-10-CM

## 2019-04-08 DIAGNOSIS — E559 Vitamin D deficiency, unspecified: Secondary | ICD-10-CM

## 2019-04-08 NOTE — Patient Instructions (Signed)
Continue to follow-up with Dr. Earlie Server and get lab work through his recommendations Above all, continue to practice good respiratory and hand hygiene by all of the members of your household Continue to drink plenty of fluids and stay well-hydrated Please let Dr. Earlie Server know that if there is any blood work that we can do in our office that will facilitate his care of you to let us know and we will be glad to do that for you and for him. Continue to follow-up with Dr. Annamaria Boots as needed Continue to eat healthy and drink plenty of fluids and stay well-hydrated

## 2019-04-08 NOTE — Telephone Encounter (Signed)
Received vm message from patient regarding upcoming appts.  TCT patient and reviewed next week's appt for her 1st time chemo on 04/16/19. Informed patient that she can get the rest of her appts next week when she comes here.  Pt voiced understanding.

## 2019-04-08 NOTE — Progress Notes (Signed)
Virtual Visit Via telephone Note I connected with@ on 04/08/19 by telephone and verified that I am speaking with the correct person or authorized healthcare agent using two identifiers. CINDA HARA is currently located at home and there are no unauthorized people in close proximity. I completed this visit while in a private location in my home .  This visit type was conducted due to national recommendations for restrictions regarding the COVID-19 Pandemic (e.g. social distancing).  This format is felt to be most appropriate for this patient at this time.  All issues noted in this document were discussed and addressed.  No physical exam was performed.    I discussed the limitations, risks, security and privacy concerns of performing an evaluation and management service by telephone and the availability of in person appointments. I also discussed with the patient that there may be a patient responsible charge related to this service. The patient expressed understanding and agreed to proceed.   Date:  04/08/2019    ID:  Maudry Diego      Oct 04, 1957        [id number]   Patient Care Team Patient Care Team: [name], MD as PCP - General (Family Medicine) Minus Breeding, MD as PCP - Cardiology (Cardiology) Deneise Lever, MD (Pulmonary Disease) Hennie Duos, MD (Rheumatology) Harlen Labs, MD as Referring Physician (Optometry) Aloha Gell, MD as Consulting Physician (Obstetrics and Gynecology)  Reason for Visit: Primary Care Follow-up     History of Present Illness & Review of Systems:     DONIA YOKUM is a 63 y.o. year old female primary care patient that presents today for a telehealth visit.  I have been following this patient for many years.  She has a history of chronic bronchitis alpha-1 antitrypsin deficiency COPD pulmonary fibrosis and unfortunately most recently small cell lung cancer with metastasis.  She sees Dr. Earlie Server regularly and is  proceeding with additional chemotherapy.  Her husband Marveen Reeks, has been extremely supportive of his wife in her office visits and her care.  The patient is not a complainer.  The patient reviewed her current situation and as far as the cancer in the lungs this is looking better.  The edema in her brain from the meds is better and she is off the Decadron.  She still is having a lot of abdominal discomfort constipation and back pain and she informed me that she thought a lot of this was secondary to the cancer in her abdomen and liver.  The chemotherapy is geared to treating this cancer.  She is beginning the new 6 therapy for the cancer in her abdomen.  She is today not complaining of any chest pain.  She says her shortness of breath is stable and that her oxygen levels at home run between 93% and 95%.  Her biggest complaint is her abdominal pain and constipation.  She is passing her water well.  Her weight is 185.  Her blood pressures running about 128/60.  She did have a visit with Dr. Percival Spanish recently.  This was a tele-visit.  She is needing help at home with ambulation and mobility.  Especially when she goes to doctor's visit she is not able to get in with the help of her husband because he is not allowed in the hospital setting.  Some type of motorized wheelchair would be very helpful for her.  She can still use her hands and arms even though her  legs are weak and she is having all the abdominal and back pain.  She has not seen Dr. Annamaria Boots recently.  Review of systems as stated, otherwise negative.  The patient does not have symptoms concerning for COVID-19 infection (fever, chills, cough, or new shortness of breath).      Current Medications (Verified) Allergies as of 04/08/2019      Reactions   Penicillins Nausea And Vomiting   Has patient had a PCN reaction causing immediate rash, facial/tongue/throat swelling, SOB or lightheadedness with hypotension: Unknown Has patient had a PCN reaction causing  severe rash involving mucus membranes or skin necrosis: Unknown Has patient had a PCN reaction that required hospitalization: Unknown Has patient had a PCN reaction occurring within the last 10 years: Unknown If all of the above answers are "NO", then may proceed with Cephalosporin use.   Actonel [risedronate Sodium]    Caused her to retain fluid and stomach problems   Advair Diskus [fluticasone-salmeterol]    Alendronate Sodium    Stomach upset / esophageal burning   Aspirin Nausea Only   Stomach burns; abdominal pain   Azithromycin Nausea And Vomiting   Keflex [cephalexin] Itching   Levofloxacin    Reports hx of GI distress and GI bleed   Morphine Itching, Swelling   Throat swells   Nsaids    Swelling, GI upset   Omnicef [cefdinir]    Erythromycin Rash      Medication List       Accurate as of Apr 08, 2019  1:26 PM. If you have any questions, ask your nurse or doctor.        albuterol (2.5 MG/3ML) 0.083% nebulizer solution Commonly known as:  PROVENTIL Take 3 mLs (2.5 mg total) by nebulization every 6 (six) hours as needed.   ProAir HFA 108 (90 Base) MCG/ACT inhaler Generic drug:  albuterol 2 PUFFS EVERY 4 HOURS AS NEEDED FOR WHEEZING   allopurinol 300 MG tablet Commonly known as:  ZYLOPRIM Take 1 tablet (300 mg total) by mouth daily. Begin on 07/25/2018   amitriptyline 75 MG tablet Commonly known as:  ELAVIL Take 75 mg by mouth at bedtime.   Compressor Nebulizer Misc 1 Device by Does not apply route as needed.   demeclocycline 150 MG tablet Commonly known as:  DECLOMYCIN TAKE  (1)  TABLET TWICE A DAY.   dexamethasone 4 MG tablet Commonly known as:  DECADRON Take 1 tablet (4 mg total) by mouth daily.   ezetimibe 10 MG tablet Commonly known as:  ZETIA TAKE 1 TABLET ONCE A DAY   feeding supplement (ENSURE ENLIVE) Liqd Take 237 mLs by mouth 2 (two) times daily between meals.   fluticasone 50 MCG/ACT nasal spray Commonly known as:  FLONASE USE 2 SPRAYS  IN EACH NOSTRIL ONCE DAILY.   folic acid 1 MG tablet Commonly known as:  FOLVITE Take 1 mg by mouth daily.   Glycopyrrolate-Formoterol 9-4.8 MCG/ACT Aero Commonly known as:  Airline pilot Inhale 2 puffs into the lungs 2 (two) times daily.   HYDROcodone-acetaminophen 5-325 MG tablet Commonly known as:  NORCO/VICODIN Take 1 tablet by mouth 3 (three) times daily.   levothyroxine 75 MCG tablet Commonly known as:  SYNTHROID Take 1 tablet (75 mcg total) by mouth daily before breakfast.   lidocaine-prilocaine cream Commonly known as:  EMLA Apply 1 application topically as needed.   omeprazole 40 MG capsule Commonly known as:  PRILOSEC TAKE (1) CAPSULE DAILY   potassium chloride SA 20 MEQ tablet Commonly known  as:  K-DUR Take 1 tablet (20 mEq total) by mouth 2 (two) times daily.   rosuvastatin 40 MG tablet Commonly known as:  CRESTOR Take 0.5 tablets (20 mg total) by mouth daily.           Allergies (Verified)    Penicillins; Actonel [risedronate sodium]; Advair diskus [fluticasone-salmeterol]; Alendronate sodium; Aspirin; Azithromycin; Keflex [cephalexin]; Levofloxacin; Morphine; Nsaids; Omnicef [cefdinir]; and Erythromycin  Past Medical History Past Medical History:  Diagnosis Date  . Alpha-1-antitrypsin deficiency ([place])   . Cervical cancer ([place]) 2004  . COPD mixed type ([place]) 09/14/2007   PFT- 05/06/2013-reduced FVC may reflect effort but the overall pattern is moderate obstructive airways disease with response to bronchodilator, air trapping, normal diffusion. This doesn't fit entirely with emphysema.      . Exposure to hepatitis C 09/12/2016  . Fibromyalgia   . GERD (gastroesophageal reflux disease)   . Hyperlipidemia   . IBS (irritable bowel syndrome)   . Obstructive sleep apnea 06/02/2008   NPSG 06/07/08, AHI 6.9/ hr, weight 220 lbs    . Osteoporosis   . Pulmonary fibrosis, unspecified ([place]) 09/29/2016   CXR 03/28/2016  . Rhinitis, nonallergic 05/08/2012  .  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 Vitamin D deficiency      Past Surgical History:  Procedure Laterality Date  . ABDOMINAL HYSTERECTOMY    . IR IMAGING GUIDED PORT INSERTION  08/02/2018  . LUMBAR DISC SURGERY    . VIDEO BRONCHOSCOPY Bilateral 07/10/2018   Procedure: VIDEO BRONCHOSCOPY WITHOUT FLUORO;  Surgeon: Laurin Coder, MD;  Location: [facility];  Service: Cardiopulmonary;  Laterality: Bilateral;    Social History   Socioeconomic History  . Marital status: Married    Spouse name: Audry Pili  . Number of children: 2  . Years of education: Not on file  . Highest education level: Not on file  Occupational History  . Occupation: disabled    Fish farm manager: UNEMPLOYED  Social Needs  . Financial resource strain: Not hard at all  . Food insecurity:    Worry: Never true    Inability: Never true  . Transportation needs:    Medical: No    Non-medical: No  Tobacco Use  . Smoking status: Former Smoker    Packs/day: 0.50    Types: Cigarettes    Start date: 11/20/1972    Last attempt to quit: 04/01/2012    Years since quitting: 7.0  . Smokeless tobacco: Never Used  Substance and Sexual Activity  . Alcohol use: No  . Drug use: No  . Sexual activity: Yes  Lifestyle  . Physical activity:    Days per week: 5 days    Minutes per session: 30 min  . Stress: Not at all  Relationships  . Social connections:    Talks on phone: More than three times a week    Gets together: More than three times a week    Attends religious service: More than 4 times per year    Active member of club or organization: No    Attends meetings of clubs or organizations: Never    Relationship status: Not on file  Other Topics Concern  . Not on file  Social History Narrative   Lives at home with husband.      Family History  Problem Relation Age of Onset  . Alpha-1 antitrypsin deficiency Sister   . Osteoporosis Sister   . Pancreatic cancer Mother 84  .  Osteoporosis  Mother   . Irregular heart beat Mother   . Heart attack Father 61       Died age 32  . Migraines Father   . CAD Sister 1       Died age 27  . Osteoporosis Sister   . Lung cancer Brother   . CAD Brother 11       CABG.  Died from leukemia  . Leukemia Brother   . Heart attack Brother   . Alpha-1 antitrypsin deficiency Other   . Heart disease Brother   . Colon cancer Neg Hx       Labs/Other Tests and Data Reviewed:    Wt Readings from Last 3 Encounters:  04/07/19 187 lb 9.6 oz (85.1 kg)  03/18/19 191 lb (86.6 kg)  02/27/19 194 lb 9.6 oz (88.3 kg)   Temp Readings from Last 3 Encounters:  04/07/19 98.7 F (37.1 C) (Oral)  02/27/19 98.2 F (36.8 C) (Oral)  02/17/19 98 F (36.7 C) (Oral)   BP Readings from Last 3 Encounters:  04/07/19 (!) 157/90  02/27/19 (!) 188/109  02/17/19 (!) 162/95   Pulse Readings from Last 3 Encounters:  04/07/19 89  02/27/19 87  02/17/19 81     Lab Results  Component Value Date   HGBA1C 5.7 09/28/2014   HGBA1C 5.5 01/20/2014   Lab Results  Component Value Date   LDLCALC 89 07/07/2018   CREATININE 0.75 04/04/2019       Chemistry      Component Value Date/Time   NA 130 (L) 04/04/2019 0846   NA 124 (L) 07/16/2018 0853   K 2.9 (LL) 04/04/2019 0846   CL 89 (L) 04/04/2019 0846   CO2 32 04/04/2019 0846   BUN 9 04/04/2019 0846   BUN 8 07/16/2018 0853   CREATININE 0.75 04/04/2019 0846   CREATININE 0.68 02/13/2013 1523      Component Value Date/Time   CALCIUM 9.0 04/04/2019 0846   ALKPHOS 180 (H) 04/04/2019 0846   AST 43 (H) 04/04/2019 0846   ALT 37 04/04/2019 0846   BILITOT 0.4 04/04/2019 0846         OBSERVATIONS/ OBJECTIVE:     The patient was pleasant and calm and she was at home and her husband was present in the room with her.  Her oxygen levels are running between 93 and 95%.  Her weight is 185 pounds.  The patient's blood pressures are running around 128/60-70 range.  She needs assistance with getting  around the home.  She is having back pain and leg weakness.  She is passing her water well.  Physical exam deferred due to nature of telephonic visit.  ASSESSMENT & PLAN    Time:   Today, I have spent 33 minutes with the patient via telephone discussing the above including Covid precautions.     Visit Diagnoses: 1. SCLC (small cell lung carcinoma), left ([place]) -Continue to follow-up with oncologist  2. Pulmonary fibrosis, unspecified ([place]) -Continue to follow-up with pulmonology and oncology  3. COPD mixed type ([place]) -Continue with current breathing treatments  4. Alpha-1-antitrypsin deficiency ([place]) -Continue with current breathing treatments  5. Mixed hyperlipidemia -Continue with Crestor and Zetia  6. Fibromyalgia -Continue with pain medication as needed  7. Vitamin D deficiency -Continue with vitamin D replacement  8. Aortic atherosclerosis ([place]) -Continue with healthy eating and statin therapy  9. Chronic bronchitis, unspecified chronic bronchitis type ([place]) -Continue with breathing treatments and inhalers  10.  Back pain -Secondary to metastasis from  lung cancer  11.  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  Back pain with radiculopathy -Prescription for motorized wheelchair to assist patient because of pain and difficulty walking.    The above assessment and management plan was discussed with the patient. The patient verbalized understanding of and has agreed to the management plan. Patient is aware to call the clinic if symptoms persist or worsen. Patient is aware when to return to the clinic for a follow-up visit. Patient educated on when it is appropriate to go to the emergency department.    [name], MD [facility] [address], [place], [place] [address] Ph  [phone]   Arrie Senate MD

## 2019-04-09 ENCOUNTER — Telehealth: Payer: Self-pay | Admitting: Internal Medicine

## 2019-04-09 NOTE — Telephone Encounter (Signed)
Scheduled appt per 5/18 los - pt is aware of appt date and time

## 2019-04-16 ENCOUNTER — Other Ambulatory Visit: Payer: Self-pay

## 2019-04-16 ENCOUNTER — Inpatient Hospital Stay: Payer: Medicare Other

## 2019-04-16 ENCOUNTER — Other Ambulatory Visit: Payer: Self-pay | Admitting: *Deleted

## 2019-04-16 VITALS — BP 143/83 | HR 91 | Temp 97.8°F | Resp 16

## 2019-04-16 DIAGNOSIS — Z5112 Encounter for antineoplastic immunotherapy: Secondary | ICD-10-CM | POA: Diagnosis not present

## 2019-04-16 DIAGNOSIS — K219 Gastro-esophageal reflux disease without esophagitis: Secondary | ICD-10-CM | POA: Diagnosis not present

## 2019-04-16 DIAGNOSIS — J8410 Pulmonary fibrosis, unspecified: Secondary | ICD-10-CM | POA: Diagnosis not present

## 2019-04-16 DIAGNOSIS — M797 Fibromyalgia: Secondary | ICD-10-CM | POA: Diagnosis not present

## 2019-04-16 DIAGNOSIS — E876 Hypokalemia: Secondary | ICD-10-CM | POA: Diagnosis not present

## 2019-04-16 DIAGNOSIS — E559 Vitamin D deficiency, unspecified: Secondary | ICD-10-CM | POA: Diagnosis not present

## 2019-04-16 DIAGNOSIS — C3492 Malignant neoplasm of unspecified part of left bronchus or lung: Secondary | ICD-10-CM

## 2019-04-16 DIAGNOSIS — C7800 Secondary malignant neoplasm of unspecified lung: Secondary | ICD-10-CM | POA: Diagnosis not present

## 2019-04-16 DIAGNOSIS — J449 Chronic obstructive pulmonary disease, unspecified: Secondary | ICD-10-CM | POA: Diagnosis not present

## 2019-04-16 DIAGNOSIS — C3412 Malignant neoplasm of upper lobe, left bronchus or lung: Secondary | ICD-10-CM | POA: Diagnosis not present

## 2019-04-16 DIAGNOSIS — I10 Essential (primary) hypertension: Secondary | ICD-10-CM | POA: Diagnosis not present

## 2019-04-16 DIAGNOSIS — Z79899 Other long term (current) drug therapy: Secondary | ICD-10-CM | POA: Diagnosis not present

## 2019-04-16 DIAGNOSIS — Z87891 Personal history of nicotine dependence: Secondary | ICD-10-CM | POA: Diagnosis not present

## 2019-04-16 DIAGNOSIS — C7951 Secondary malignant neoplasm of bone: Secondary | ICD-10-CM | POA: Diagnosis not present

## 2019-04-16 DIAGNOSIS — K589 Irritable bowel syndrome without diarrhea: Secondary | ICD-10-CM | POA: Diagnosis not present

## 2019-04-16 DIAGNOSIS — E785 Hyperlipidemia, unspecified: Secondary | ICD-10-CM | POA: Diagnosis not present

## 2019-04-16 DIAGNOSIS — E871 Hypo-osmolality and hyponatremia: Secondary | ICD-10-CM | POA: Diagnosis not present

## 2019-04-16 DIAGNOSIS — C787 Secondary malignant neoplasm of liver and intrahepatic bile duct: Secondary | ICD-10-CM | POA: Diagnosis not present

## 2019-04-16 LAB — CBC WITH DIFFERENTIAL (CANCER CENTER ONLY)
Abs Immature Granulocytes: 0.02 10*3/uL (ref 0.00–0.07)
Basophils Absolute: 0 10*3/uL (ref 0.0–0.1)
Basophils Relative: 1 %
Eosinophils Absolute: 0 10*3/uL (ref 0.0–0.5)
Eosinophils Relative: 1 %
HCT: 39.8 % (ref 36.0–46.0)
Hemoglobin: 13.6 g/dL (ref 12.0–15.0)
Immature Granulocytes: 0 %
Lymphocytes Relative: 11 %
Lymphs Abs: 0.6 10*3/uL — ABNORMAL LOW (ref 0.7–4.0)
MCH: 30.7 pg (ref 26.0–34.0)
MCHC: 34.2 g/dL (ref 30.0–36.0)
MCV: 89.8 fL (ref 80.0–100.0)
Monocytes Absolute: 0.8 10*3/uL (ref 0.1–1.0)
Monocytes Relative: 14 %
Neutro Abs: 4 10*3/uL (ref 1.7–7.7)
Neutrophils Relative %: 73 %
Platelet Count: 188 10*3/uL (ref 150–400)
RBC: 4.43 MIL/uL (ref 3.87–5.11)
RDW: 18.6 % — ABNORMAL HIGH (ref 11.5–15.5)
WBC Count: 5.5 10*3/uL (ref 4.0–10.5)
nRBC: 0 % (ref 0.0–0.2)

## 2019-04-16 LAB — CMP (CANCER CENTER ONLY)
ALT: 39 U/L (ref 0–44)
AST: 44 U/L — ABNORMAL HIGH (ref 15–41)
Albumin: 4 g/dL (ref 3.5–5.0)
Alkaline Phosphatase: 188 U/L — ABNORMAL HIGH (ref 38–126)
Anion gap: 12 (ref 5–15)
BUN: 9 mg/dL (ref 6–20)
CO2: 27 mmol/L (ref 22–32)
Calcium: 9.1 mg/dL (ref 8.9–10.3)
Chloride: 90 mmol/L — ABNORMAL LOW (ref 98–111)
Creatinine: 0.84 mg/dL (ref 0.44–1.00)
GFR, Est AFR Am: >60 mL/min (ref >60)
GFR, Estimated: >60 mL/min (ref >60)
Glucose, Bld: 122 mg/dL — ABNORMAL HIGH (ref 70–99)
Potassium: 3.1 mmol/L — ABNORMAL LOW (ref 3.5–5.1)
Sodium: 129 mmol/L — ABNORMAL LOW (ref 135–145)
Total Bilirubin: 0.6 mg/dL (ref 0.3–1.2)
Total Protein: 8.6 g/dL — ABNORMAL HIGH (ref 6.5–8.1)

## 2019-04-16 MED ORDER — SODIUM CHLORIDE 0.9 % IV SOLN
30.0000 mg/m2 | Freq: Once | INTRAVENOUS | Status: AC
  Administered 2019-04-16: 14:00:00 59 mg via INTRAVENOUS
  Filled 2019-04-16: qty 59

## 2019-04-16 MED ORDER — ATROPINE SULFATE 1 MG/ML IJ SOLN: 0.5000 mg | Freq: Once | INTRAMUSCULAR | Status: DC | PRN

## 2019-04-16 MED ORDER — SODIUM CHLORIDE 0.9% FLUSH
10.0000 mL | INTRAVENOUS | Status: DC | PRN
  Administered 2019-04-16: 17:00:00 10 mL
  Filled 2019-04-16: qty 10

## 2019-04-16 MED ORDER — PALONOSETRON HCL INJECTION 0.25 MG/5ML
0.2500 mg | Freq: Once | INTRAVENOUS | Status: AC
  Administered 2019-04-16: 12:00:00 0.25 mg via INTRAVENOUS

## 2019-04-16 MED ORDER — PROCHLORPERAZINE MALEATE 10 MG PO TABS: 10.0000 mg | ORAL_TABLET | Freq: Four times a day (QID) | ORAL | 1 refills | Status: AC | PRN

## 2019-04-16 MED ORDER — IRINOTECAN HCL CHEMO INJECTION 100 MG/5ML
65.0000 mg/m2 | Freq: Once | INTRAVENOUS | Status: AC
  Administered 2019-04-16: 13:00:00 120 mg via INTRAVENOUS
  Filled 2019-04-16: qty 6

## 2019-04-16 MED ORDER — HEPARIN SOD (PORK) LOCK FLUSH 100 UNIT/ML IV SOLN
500.0000 [IU] | Freq: Once | INTRAVENOUS | Status: AC | PRN
  Administered 2019-04-16: 17:00:00 500 [IU]
  Filled 2019-04-16: qty 5

## 2019-04-16 MED ORDER — SODIUM CHLORIDE 0.9 % IV SOLN
Freq: Once | INTRAVENOUS | Status: AC
  Administered 2019-04-16: 12:00:00 via INTRAVENOUS
  Filled 2019-04-16: qty 5

## 2019-04-16 MED ORDER — SODIUM CHLORIDE 0.9 % IV SOLN
Freq: Once | INTRAVENOUS | Status: AC
  Administered 2019-04-16: 09:00:00 via INTRAVENOUS
  Filled 2019-04-16: qty 250

## 2019-04-16 MED ORDER — PALONOSETRON HCL INJECTION 0.25 MG/5ML
INTRAVENOUS | Status: AC
  Filled 2019-04-16: qty 5

## 2019-04-16 MED ORDER — ATROPINE SULFATE 0.4 MG/ML IJ SOLN: 0.4000 mg | Freq: Once | INTRAMUSCULAR | Status: DC | PRN

## 2019-04-16 MED ORDER — POTASSIUM CHLORIDE 2 MEQ/ML IV SOLN
Freq: Once | INTRAVENOUS | Status: AC
  Administered 2019-04-16: 10:00:00 via INTRAVENOUS
  Filled 2019-04-16: qty 10

## 2019-04-16 NOTE — Patient Instructions (Signed)
[facility] Discharge Instructions for Patients Receiving Chemotherapy  Today you received the following chemotherapy agents Cisplatin, Irinotecan  To help prevent nausea and vomiting after your treatment, we encourage you to take your nausea medication as directed.   If you develop nausea and vomiting that is not controlled by your nausea medication, call the clinic.   BELOW ARE SYMPTOMS THAT SHOULD BE REPORTED IMMEDIATELY:  *FEVER GREATER THAN 100.5 F  *CHILLS WITH OR WITHOUT FEVER  NAUSEA AND VOMITING THAT IS NOT CONTROLLED WITH YOUR NAUSEA MEDICATION  *UNUSUAL SHORTNESS OF BREATH  *UNUSUAL BRUISING OR BLEEDING  TENDERNESS IN MOUTH AND THROAT WITH OR WITHOUT PRESENCE OF ULCERS  *URINARY PROBLEMS  *BOWEL PROBLEMS  UNUSUAL RASH Items with * indicate a potential emergency and should be followed up as soon as possible.  Feel free to call the clinic should you have any questions or concerns. The clinic phone number is [phone].  Please show the [facility] at check-in to the Emergency Department and triage nurse.  Irinotecan injection What is this medicine? IRINOTECAN (ir in oh TEE kan ) is a chemotherapy drug. It is used to treat colon and rectal cancer. This medicine may be used for other purposes; ask your health care provider or pharmacist if you have questions. COMMON BRAND NAME(S): Camptosar What should I tell my health care provider before I take this medicine? They need to know if you have any of these conditions: -dehydration -diarrhea -infection (especially a virus infection such as chickenpox, cold sores, or herpes) -liver disease -low blood counts, like low white cell, platelet, or red cell counts -low levels of calcium, magnesium, or potassium in the blood -recent or ongoing radiation therapy -an unusual or allergic reaction to irinotecan, other medicines, foods, dyes, or preservatives -pregnant or trying to get  pregnant -breast-feeding How should I use this medicine? This drug is given as an infusion into a vein. It is administered in a hospital or clinic by a specially trained health care professional. Talk to your pediatrician regarding the use of this medicine in children. Special care may be needed. Overdosage: If you think you have taken too much of this medicine contact a poison control center or emergency room at once. NOTE: This medicine is only for you. Do not share this medicine with others. What if I miss a dose? It is important not to miss your dose. Call your doctor or health care professional if you are unable to keep an appointment. What may interact with this medicine? This medicine may interact with the following medications: -antiviral medicines for HIV or AIDS -certain antibiotics like rifampin or rifabutin -certain medicines for fungal infections like itraconazole, ketoconazole, posaconazole, and voriconazole -certain medicines for seizures like carbamazepine, phenobarbital, phenotoin -clarithromycin -gemfibrozil -nefazodone -St. John's Wort This list may not describe all possible interactions. Give your health care provider a list of all the medicines, herbs, non-prescription drugs, or dietary supplements you use. Also tell them if you smoke, drink alcohol, or use illegal drugs. Some items may interact with your medicine. What should I watch for while using this medicine? Your condition will be monitored carefully while you are receiving this medicine. You will need important blood work done while you are taking this medicine. This drug may make you feel generally unwell. This is not uncommon, as chemotherapy can affect healthy cells as well as cancer cells. Report any side effects. Continue your course of treatment even though you feel ill unless your doctor tells  you to stop. In some cases, you may be given additional medicines to help with side effects. Follow all directions  for their use. You may get drowsy or dizzy. Do not drive, use machinery, or do anything that needs mental alertness until you know how this medicine affects you. Do not stand or sit up quickly, especially if you are an older patient. This reduces the risk of dizzy or fainting spells. Call your doctor or health care professional for advice if you get a fever, chills or sore throat, or other symptoms of a cold or flu. Do not treat yourself. This drug decreases your body's ability to fight infections. Try to avoid being around people who are sick. This medicine may increase your risk to bruise or bleed. Call your doctor or health care professional if you notice any unusual bleeding. Be careful brushing and flossing your teeth or using a toothpick because you may get an infection or bleed more easily. If you have any dental work done, tell your dentist you are receiving this medicine. Avoid taking products that contain aspirin, acetaminophen, ibuprofen, naproxen, or ketoprofen unless instructed by your doctor. These medicines may hide a fever. Do not become pregnant while taking this medicine. Women should inform their doctor if they wish to become pregnant or think they might be pregnant. There is a potential for serious side effects to an unborn child. Talk to your health care professional or pharmacist for more information. Do not breast-feed an infant while taking this medicine. What side effects may I notice from receiving this medicine? Side effects that you should report to your doctor or health care professional as soon as possible: -allergic reactions like skin rash, itching or hives, swelling of the face, lips, or tongue -chest pain -diarrhea -flushing, runny nose, sweating during infusion -low blood counts - this medicine may decrease the number of white blood cells, red blood cells and platelets. You may be at increased risk for infections and bleeding. -nausea, vomiting -pain, swelling,  warmth in the leg -signs of decreased platelets or bleeding - bruising, pinpoint red spots on the skin, black, tarry stools, blood in the urine -signs of infection - fever or chills, cough, sore throat, pain or difficulty passing urine -signs of decreased red blood cells - unusually weak or tired, fainting spells, lightheadedness Side effects that usually do not require medical attention (report to your doctor or health care professional if they continue or are bothersome): -constipation -hair loss -headache -loss of appetite -mouth sores -stomach pain This list may not describe all possible side effects. Call your doctor for medical advice about side effects. You may report side effects to FDA at 1-800-FDA-1088. Where should I keep my medicine? This drug is given in a hospital or clinic and will not be stored at home. NOTE: This sheet is a summary. It may not cover all possible information. If you have questions about this medicine, talk to your doctor, pharmacist, or health care provider.  2019 Elsevier/Gold Standard (2018-01-22 15:02:58) Cisplatin injection What is this medicine? CISPLATIN (SIS pla tin) is a chemotherapy drug. It targets fast dividing cells, like cancer cells, and causes these cells to die. This medicine is used to treat many types of cancer like bladder, ovarian, and testicular cancers. This medicine may be used for other purposes; ask your health care provider or pharmacist if you have questions. COMMON BRAND NAME(S): Platinol, Platinol -AQ What should I tell my health care provider before I take this medicine? They need  to know if you have any of these conditions: -blood disorders -hearing problems -kidney disease -recent or ongoing radiation therapy -an unusual or allergic reaction to cisplatin, carboplatin, other chemotherapy, other medicines, foods, dyes, or preservatives -pregnant or trying to get pregnant -breast-feeding How should I use this medicine? This  drug is given as an infusion into a vein. It is administered in a hospital or clinic by a specially trained health care professional. Talk to your pediatrician regarding the use of this medicine in children. Special care may be needed. Overdosage: If you think you have taken too much of this medicine contact a poison control center or emergency room at once. NOTE: This medicine is only for you. Do not share this medicine with others. What if I miss a dose? It is important not to miss a dose. Call your doctor or health care professional if you are unable to keep an appointment. What may interact with this medicine? -dofetilide -foscarnet -medicines for seizures -medicines to increase blood counts like filgrastim, pegfilgrastim, sargramostim -probenecid -pyridoxine used with altretamine -rituximab -some antibiotics like amikacin, gentamicin, neomycin, polymyxin B, streptomycin, tobramycin -sulfinpyrazone -vaccines -zalcitabine Talk to your doctor or health care professional before taking any of these medicines: -acetaminophen -aspirin -ibuprofen -ketoprofen -naproxen This list may not describe all possible interactions. Give your health care provider a list of all the medicines, herbs, non-prescription drugs, or dietary supplements you use. Also tell them if you smoke, drink alcohol, or use illegal drugs. Some items may interact with your medicine. What should I watch for while using this medicine? Your condition will be monitored carefully while you are receiving this medicine. You will need important blood work done while you are taking this medicine. This drug may make you feel generally unwell. This is not uncommon, as chemotherapy can affect healthy cells as well as cancer cells. Report any side effects. Continue your course of treatment even though you feel ill unless your doctor tells you to stop. In some cases, you may be given additional medicines to help with side effects. Follow  all directions for their use. Call your doctor or health care professional for advice if you get a fever, chills or sore throat, or other symptoms of a cold or flu. Do not treat yourself. This drug decreases your body's ability to fight infections. Try to avoid being around people who are sick. This medicine may increase your risk to bruise or bleed. Call your doctor or health care professional if you notice any unusual bleeding. Be careful brushing and flossing your teeth or using a toothpick because you may get an infection or bleed more easily. If you have any dental work done, tell your dentist you are receiving this medicine. Avoid taking products that contain aspirin, acetaminophen, ibuprofen, naproxen, or ketoprofen unless instructed by your doctor. These medicines may hide a fever. Do not become pregnant while taking this medicine. Women should inform their doctor if they wish to become pregnant or think they might be pregnant. There is a potential for serious side effects to an unborn child. Talk to your health care professional or pharmacist for more information. Do not breast-feed an infant while taking this medicine. Drink fluids as directed while you are taking this medicine. This will help protect your kidneys. Call your doctor or health care professional if you get diarrhea. Do not treat yourself. What side effects may I notice from receiving this medicine? Side effects that you should report to your doctor or health care professional  as soon as possible: -allergic reactions like skin rash, itching or hives, swelling of the face, lips, or tongue -signs of infection - fever or chills, cough, sore throat, pain or difficulty passing urine -signs of decreased platelets or bleeding - bruising, pinpoint red spots on the skin, black, tarry stools, nosebleeds -signs of decreased red blood cells - unusually weak or tired, fainting spells, lightheadedness -breathing problems -changes in  hearing -gout pain -low blood counts - This drug may decrease the number of white blood cells, red blood cells and platelets. You may be at increased risk for infections and bleeding. -nausea and vomiting -pain, swelling, redness or irritation at the injection site -pain, tingling, numbness in the hands or feet -problems with balance, movement -trouble passing urine or change in the amount of urine Side effects that usually do not require medical attention (report to your doctor or health care professional if they continue or are bothersome): -changes in vision -loss of appetite -metallic taste in the mouth or changes in taste This list may not describe all possible side effects. Call your doctor for medical advice about side effects. You may report side effects to FDA at 1-800-FDA-1088. Where should I keep my medicine? This drug is given in a hospital or clinic and will not be stored at home. NOTE: This sheet is a summary. It may not cover all possible information. If you have questions about this medicine, talk to your doctor, pharmacist, or health care provider.  2019 Elsevier/Gold Standard (2008-02-11 14:40:54)

## 2019-04-17 ENCOUNTER — Telehealth: Payer: Self-pay | Admitting: *Deleted

## 2019-04-17 NOTE — Telephone Encounter (Signed)
-----   Message from Rennis Harding, RN sent at 04/16/2019  5:32 PM EDT ----- Regarding: Dr. Julien Nordmann 1st time 1st time chemo f/u

## 2019-04-17 NOTE — Telephone Encounter (Signed)
Called [name] for chemotherapy F/U.  Patient is doing well.  Denies n/v.  Denies any new side effects or symptoms.  Bowel and bladder functioning well with LBM today.  Confirmed Imodium on hand.  Bad taste buds so eating small amounts.  Drinking well.  Instructed to drink 64 oz minimum daily or at least the day before, of and after treatment however reports "previously drinking too much water so my doctor said to drink Power Aid and juices".  Denies questions or needs at this time.  Encouraged to call [phone] Mon -Fri 8:00 am - 4:30 pm or anytime as needed for symptoms, changes or event outside office hours.

## 2019-04-18 ENCOUNTER — Other Ambulatory Visit: Payer: Self-pay | Admitting: *Deleted

## 2019-04-18 ENCOUNTER — Telehealth: Payer: Self-pay | Admitting: *Deleted

## 2019-04-18 DIAGNOSIS — E876 Hypokalemia: Secondary | ICD-10-CM

## 2019-04-18 MED ORDER — POTASSIUM CHLORIDE CRYS ER 20 MEQ PO TBCR: 20.0000 meq | EXTENDED_RELEASE_TABLET | Freq: Two times a day (BID) | ORAL | 0 refills | Status: DC

## 2019-04-18 NOTE — Telephone Encounter (Signed)
-----   Message from Ardeen Garland, RN sent at 04/17/2019  5:07 PM EDT ----- I have not done this call. ----- Message ----- From: Curt Bears, MD Sent: 04/16/2019   5:09 PM EDT To: Ardeen Garland, RN, Otila Kluver, RN  Please encourage potassium rich diet. ----- Message ----- From: [place]: 04/16/2019   8:04 AM EDT To: Curt Bears, MD

## 2019-04-18 NOTE — Telephone Encounter (Signed)
TCT patient regarding low K+ from 04/16/19. Spoke with patient and instructed on K+ rich foods. She voiced understanding. She states she has run out of K+ supplement. This was refilled. No further questions or concerns

## 2019-04-19 DIAGNOSIS — J189 Pneumonia, unspecified organism: Secondary | ICD-10-CM | POA: Diagnosis not present

## 2019-04-19 DIAGNOSIS — C3492 Malignant neoplasm of unspecified part of left bronchus or lung: Secondary | ICD-10-CM | POA: Diagnosis not present

## 2019-04-23 ENCOUNTER — Inpatient Hospital Stay: Payer: Medicare Other

## 2019-04-23 ENCOUNTER — Inpatient Hospital Stay: Payer: Medicare Other | Attending: Internal Medicine

## 2019-04-23 ENCOUNTER — Other Ambulatory Visit: Payer: Self-pay

## 2019-04-23 VITALS — BP 151/90 | HR 82 | Temp 98.7°F | Resp 16

## 2019-04-23 DIAGNOSIS — Z79899 Other long term (current) drug therapy: Secondary | ICD-10-CM | POA: Insufficient documentation

## 2019-04-23 DIAGNOSIS — C7951 Secondary malignant neoplasm of bone: Secondary | ICD-10-CM | POA: Diagnosis not present

## 2019-04-23 DIAGNOSIS — C3492 Malignant neoplasm of unspecified part of left bronchus or lung: Secondary | ICD-10-CM | POA: Insufficient documentation

## 2019-04-23 DIAGNOSIS — Z5111 Encounter for antineoplastic chemotherapy: Secondary | ICD-10-CM | POA: Insufficient documentation

## 2019-04-23 DIAGNOSIS — J449 Chronic obstructive pulmonary disease, unspecified: Secondary | ICD-10-CM | POA: Insufficient documentation

## 2019-04-23 DIAGNOSIS — E559 Vitamin D deficiency, unspecified: Secondary | ICD-10-CM | POA: Diagnosis not present

## 2019-04-23 DIAGNOSIS — D696 Thrombocytopenia, unspecified: Secondary | ICD-10-CM | POA: Diagnosis not present

## 2019-04-23 DIAGNOSIS — E785 Hyperlipidemia, unspecified: Secondary | ICD-10-CM | POA: Diagnosis not present

## 2019-04-23 DIAGNOSIS — Z8541 Personal history of malignant neoplasm of cervix uteri: Secondary | ICD-10-CM | POA: Insufficient documentation

## 2019-04-23 DIAGNOSIS — Z7951 Long term (current) use of inhaled steroids: Secondary | ICD-10-CM | POA: Diagnosis not present

## 2019-04-23 DIAGNOSIS — C787 Secondary malignant neoplasm of liver and intrahepatic bile duct: Secondary | ICD-10-CM | POA: Insufficient documentation

## 2019-04-23 DIAGNOSIS — Z5189 Encounter for other specified aftercare: Secondary | ICD-10-CM | POA: Diagnosis not present

## 2019-04-23 LAB — CMP (CANCER CENTER ONLY)
ALT: 45 U/L — ABNORMAL HIGH (ref 0–44)
AST: 58 U/L — ABNORMAL HIGH (ref 15–41)
Albumin: 3.7 g/dL (ref 3.5–5.0)
Alkaline Phosphatase: 211 U/L — ABNORMAL HIGH (ref 38–126)
Anion gap: 11 (ref 5–15)
BUN: 8 mg/dL (ref 6–20)
CO2: 29 mmol/L (ref 22–32)
Calcium: 9.2 mg/dL (ref 8.9–10.3)
Chloride: 91 mmol/L — ABNORMAL LOW (ref 98–111)
Creatinine: 0.77 mg/dL (ref 0.44–1.00)
GFR, Est AFR Am: >60 mL/min (ref >60)
GFR, Estimated: >60 mL/min (ref >60)
Glucose, Bld: 109 mg/dL — ABNORMAL HIGH (ref 70–99)
Potassium: 3.7 mmol/L (ref 3.5–5.1)
Sodium: 131 mmol/L — ABNORMAL LOW (ref 135–145)
Total Bilirubin: 0.4 mg/dL (ref 0.3–1.2)
Total Protein: 7.5 g/dL (ref 6.5–8.1)

## 2019-04-23 LAB — CBC WITH DIFFERENTIAL (CANCER CENTER ONLY)
Abs Immature Granulocytes: 0.01 10*3/uL (ref 0.00–0.07)
Basophils Absolute: 0 10*3/uL (ref 0.0–0.1)
Basophils Relative: 0 %
Eosinophils Absolute: 0.1 10*3/uL (ref 0.0–0.5)
Eosinophils Relative: 3 %
HCT: 35.3 % — ABNORMAL LOW (ref 36.0–46.0)
Hemoglobin: 11.7 g/dL — ABNORMAL LOW (ref 12.0–15.0)
Immature Granulocytes: 0 %
Lymphocytes Relative: 20 %
Lymphs Abs: 0.6 10*3/uL — ABNORMAL LOW (ref 0.7–4.0)
MCH: 30.6 pg (ref 26.0–34.0)
MCHC: 33.1 g/dL (ref 30.0–36.0)
MCV: 92.4 fL (ref 80.0–100.0)
Monocytes Absolute: 0.4 10*3/uL (ref 0.1–1.0)
Monocytes Relative: 14 %
Neutro Abs: 1.7 10*3/uL (ref 1.7–7.7)
Neutrophils Relative %: 63 %
Platelet Count: 83 10*3/uL — ABNORMAL LOW (ref 150–400)
RBC: 3.82 MIL/uL — ABNORMAL LOW (ref 3.87–5.11)
RDW: 18 % — ABNORMAL HIGH (ref 11.5–15.5)
WBC Count: 2.8 10*3/uL — ABNORMAL LOW (ref 4.0–10.5)
nRBC: 0 % (ref 0.0–0.2)

## 2019-04-23 LAB — MAGNESIUM: Magnesium: 1.6 mg/dL — ABNORMAL LOW (ref 1.7–2.4)

## 2019-04-23 MED ORDER — SODIUM CHLORIDE 0.9% FLUSH
10.0000 mL | INTRAVENOUS | Status: DC | PRN
  Filled 2019-04-23: qty 10

## 2019-04-23 MED ORDER — IRINOTECAN HCL CHEMO INJECTION 100 MG/5ML
65.0000 mg/m2 | Freq: Once | INTRAVENOUS | Status: AC
  Administered 2019-04-23: 120 mg via INTRAVENOUS
  Filled 2019-04-23: qty 6

## 2019-04-23 MED ORDER — SODIUM CHLORIDE 0.9 % IV SOLN
Freq: Once | INTRAVENOUS | Status: AC
  Administered 2019-04-23: 12:00:00 via INTRAVENOUS
  Filled 2019-04-23: qty 5

## 2019-04-23 MED ORDER — PALONOSETRON HCL INJECTION 0.25 MG/5ML
INTRAVENOUS | Status: AC
  Filled 2019-04-23: qty 5

## 2019-04-23 MED ORDER — ATROPINE SULFATE 0.4 MG/ML IJ SOLN
INTRAMUSCULAR | Status: AC
  Filled 2019-04-23: qty 1

## 2019-04-23 MED ORDER — SODIUM CHLORIDE 0.9 % IV SOLN
30.0000 mg/m2 | Freq: Once | INTRAVENOUS | Status: AC
  Administered 2019-04-23: 59 mg via INTRAVENOUS
  Filled 2019-04-23: qty 59

## 2019-04-23 MED ORDER — SODIUM CHLORIDE 0.9 % IV SOLN
Freq: Once | INTRAVENOUS | Status: AC
  Administered 2019-04-23: 10:00:00 via INTRAVENOUS
  Filled 2019-04-23: qty 250

## 2019-04-23 MED ORDER — POTASSIUM CHLORIDE 2 MEQ/ML IV SOLN: Freq: Once | INTRAVENOUS | Status: DC

## 2019-04-23 MED ORDER — HEPARIN SOD (PORK) LOCK FLUSH 100 UNIT/ML IV SOLN
500.0000 [IU] | Freq: Once | INTRAVENOUS | Status: DC | PRN
  Filled 2019-04-23: qty 5

## 2019-04-23 MED ORDER — PALONOSETRON HCL INJECTION 0.25 MG/5ML
0.2500 mg | Freq: Once | INTRAVENOUS | Status: AC
  Administered 2019-04-23: 0.25 mg via INTRAVENOUS

## 2019-04-23 MED ORDER — ATROPINE SULFATE 1 MG/ML IJ SOLN: 0.4000 mg | Freq: Once | INTRAMUSCULAR | Status: DC

## 2019-04-23 MED ORDER — POTASSIUM CHLORIDE 2 MEQ/ML IV SOLN
Freq: Once | INTRAVENOUS | Status: AC
  Administered 2019-04-23: 10:00:00 via INTRAVENOUS
  Filled 2019-04-23: qty 1000

## 2019-04-23 MED ORDER — ATROPINE SULFATE 1 MG/ML IJ SOLN: 0.5000 mg | Freq: Once | INTRAMUSCULAR | Status: DC | PRN

## 2019-04-23 NOTE — Patient Instructions (Signed)
[facility] Discharge Instructions for Patients Receiving Chemotherapy  Today you received the following chemotherapy agents:  Irinotecan and Cisplatin.  To help prevent nausea and vomiting after your treatment, we encourage you to take your nausea medication as directed.   If you develop nausea and vomiting that is not controlled by your nausea medication, call the clinic.   BELOW ARE SYMPTOMS THAT SHOULD BE REPORTED IMMEDIATELY:  *FEVER GREATER THAN 100.5 F  *CHILLS WITH OR WITHOUT FEVER  NAUSEA AND VOMITING THAT IS NOT CONTROLLED WITH YOUR NAUSEA MEDICATION  *UNUSUAL SHORTNESS OF BREATH  *UNUSUAL BRUISING OR BLEEDING  TENDERNESS IN MOUTH AND THROAT WITH OR WITHOUT PRESENCE OF ULCERS  *URINARY PROBLEMS  *BOWEL PROBLEMS  UNUSUAL RASH Items with * indicate a potential emergency and should be followed up as soon as possible.  Feel free to call the clinic should you have any questions or concerns. The clinic phone number is [phone].  Please show the [facility] at check-in to the Emergency Department and triage nurse.

## 2019-04-23 NOTE — Progress Notes (Signed)
Per Dr. Julien Nordmann, ok to treat with platelet count of 83 and magnesium of 1.6. Dr. Julien Nordmann will add 2 grams of magnesium sulfate to hydration fluids.

## 2019-04-23 NOTE — Progress Notes (Signed)
Ok to treat with pltc = 83 per MD. Add Magnesium Sulfate 2gms for a total of 3.5gm to IVF hydration.   Hardie Pulley, PharmD, BCPS, BCOP

## 2019-04-24 ENCOUNTER — Telehealth: Payer: Self-pay | Admitting: Medical Oncology

## 2019-04-24 ENCOUNTER — Ambulatory Visit: Payer: Medicare Other | Admitting: Radiation Oncology

## 2019-04-24 NOTE — Telephone Encounter (Signed)
Irregular BM . Normal pattern daily .She had large hard stool today -first one in 7 days  Meds she is taking :  miralax 1 capful daily   stool softener bid.   hydrocodone TID prn .  aloxi last week for premed.  PO fluid- she has a high fluid intake.   I instructed her to maintain stool softener at BID and increase miralax BID. Mohamed to advise. Continue po fluids and to call back  for persistent constipation

## 2019-04-25 ENCOUNTER — Other Ambulatory Visit: Payer: Self-pay | Admitting: Internal Medicine

## 2019-05-02 DIAGNOSIS — M150 Primary generalized (osteo)arthritis: Secondary | ICD-10-CM | POA: Diagnosis not present

## 2019-05-02 DIAGNOSIS — M797 Fibromyalgia: Secondary | ICD-10-CM | POA: Diagnosis not present

## 2019-05-02 DIAGNOSIS — M5136 Other intervertebral disc degeneration, lumbar region: Secondary | ICD-10-CM | POA: Diagnosis not present

## 2019-05-02 DIAGNOSIS — M25512 Pain in left shoulder: Secondary | ICD-10-CM | POA: Diagnosis not present

## 2019-05-07 ENCOUNTER — Other Ambulatory Visit: Payer: Self-pay

## 2019-05-07 ENCOUNTER — Inpatient Hospital Stay: Payer: Medicare Other

## 2019-05-07 ENCOUNTER — Other Ambulatory Visit: Payer: Self-pay | Admitting: Physician Assistant

## 2019-05-07 ENCOUNTER — Telehealth: Payer: Self-pay | Admitting: Internal Medicine

## 2019-05-07 ENCOUNTER — Inpatient Hospital Stay (HOSPITAL_BASED_OUTPATIENT_CLINIC_OR_DEPARTMENT_OTHER): Payer: Medicare Other | Admitting: Physician Assistant

## 2019-05-07 ENCOUNTER — Other Ambulatory Visit: Payer: Self-pay | Admitting: Family Medicine

## 2019-05-07 ENCOUNTER — Encounter: Payer: Self-pay | Admitting: Physician Assistant

## 2019-05-07 VITALS — BP 140/79 | HR 83 | Temp 98.7°F | Resp 20 | Ht 65.0 in | Wt 187.8 lb

## 2019-05-07 DIAGNOSIS — D61810 Antineoplastic chemotherapy induced pancytopenia: Secondary | ICD-10-CM

## 2019-05-07 DIAGNOSIS — Z79899 Other long term (current) drug therapy: Secondary | ICD-10-CM | POA: Diagnosis not present

## 2019-05-07 DIAGNOSIS — E559 Vitamin D deficiency, unspecified: Secondary | ICD-10-CM | POA: Diagnosis not present

## 2019-05-07 DIAGNOSIS — E785 Hyperlipidemia, unspecified: Secondary | ICD-10-CM

## 2019-05-07 DIAGNOSIS — Z5111 Encounter for antineoplastic chemotherapy: Secondary | ICD-10-CM | POA: Diagnosis not present

## 2019-05-07 DIAGNOSIS — E876 Hypokalemia: Secondary | ICD-10-CM

## 2019-05-07 DIAGNOSIS — J449 Chronic obstructive pulmonary disease, unspecified: Secondary | ICD-10-CM

## 2019-05-07 DIAGNOSIS — Z7951 Long term (current) use of inhaled steroids: Secondary | ICD-10-CM

## 2019-05-07 DIAGNOSIS — D696 Thrombocytopenia, unspecified: Secondary | ICD-10-CM | POA: Diagnosis not present

## 2019-05-07 DIAGNOSIS — C3492 Malignant neoplasm of unspecified part of left bronchus or lung: Secondary | ICD-10-CM | POA: Diagnosis not present

## 2019-05-07 DIAGNOSIS — T451X5A Adverse effect of antineoplastic and immunosuppressive drugs, initial encounter: Secondary | ICD-10-CM | POA: Insufficient documentation

## 2019-05-07 DIAGNOSIS — Z8541 Personal history of malignant neoplasm of cervix uteri: Secondary | ICD-10-CM

## 2019-05-07 DIAGNOSIS — Z5189 Encounter for other specified aftercare: Secondary | ICD-10-CM | POA: Diagnosis not present

## 2019-05-07 DIAGNOSIS — C7951 Secondary malignant neoplasm of bone: Secondary | ICD-10-CM | POA: Diagnosis not present

## 2019-05-07 DIAGNOSIS — Z95828 Presence of other vascular implants and grafts: Secondary | ICD-10-CM

## 2019-05-07 DIAGNOSIS — C787 Secondary malignant neoplasm of liver and intrahepatic bile duct: Secondary | ICD-10-CM | POA: Diagnosis not present

## 2019-05-07 LAB — CBC WITH DIFFERENTIAL (CANCER CENTER ONLY)
Abs Immature Granulocytes: 0 10*3/uL (ref 0.00–0.07)
Basophils Absolute: 0 10*3/uL (ref 0.0–0.1)
Basophils Relative: 1 %
Eosinophils Absolute: 0 10*3/uL (ref 0.0–0.5)
Eosinophils Relative: 3 %
HCT: 28.8 % — ABNORMAL LOW (ref 36.0–46.0)
Hemoglobin: 10 g/dL — ABNORMAL LOW (ref 12.0–15.0)
Immature Granulocytes: 0 %
Lymphocytes Relative: 38 %
Lymphs Abs: 0.6 10*3/uL — ABNORMAL LOW (ref 0.7–4.0)
MCH: 32.1 pg (ref 26.0–34.0)
MCHC: 34.7 g/dL (ref 30.0–36.0)
MCV: 92.3 fL (ref 80.0–100.0)
Monocytes Absolute: 0.3 10*3/uL (ref 0.1–1.0)
Monocytes Relative: 23 %
Neutro Abs: 0.5 10*3/uL — ABNORMAL LOW (ref 1.7–7.7)
Neutrophils Relative %: 35 %
Platelet Count: 65 10*3/uL — ABNORMAL LOW (ref 150–400)
RBC: 3.12 MIL/uL — ABNORMAL LOW (ref 3.87–5.11)
RDW: 18.3 % — ABNORMAL HIGH (ref 11.5–15.5)
WBC Count: 1.4 10*3/uL — ABNORMAL LOW (ref 4.0–10.5)
nRBC: 0 % (ref 0.0–0.2)

## 2019-05-07 LAB — CMP (CANCER CENTER ONLY)
ALT: 24 U/L (ref 0–44)
AST: 30 U/L (ref 15–41)
Albumin: 3.5 g/dL (ref 3.5–5.0)
Alkaline Phosphatase: 170 U/L — ABNORMAL HIGH (ref 38–126)
Anion gap: 11 (ref 5–15)
BUN: 6 mg/dL (ref 6–20)
CO2: 26 mmol/L (ref 22–32)
Calcium: 8.6 mg/dL — ABNORMAL LOW (ref 8.9–10.3)
Chloride: 92 mmol/L — ABNORMAL LOW (ref 98–111)
Creatinine: 0.7 mg/dL (ref 0.44–1.00)
GFR, Est AFR Am: >60 mL/min (ref >60)
GFR, Estimated: >60 mL/min (ref >60)
Glucose, Bld: 115 mg/dL — ABNORMAL HIGH (ref 70–99)
Potassium: 2.8 mmol/L — CL (ref 3.5–5.1)
Sodium: 129 mmol/L — ABNORMAL LOW (ref 135–145)
Total Bilirubin: 0.4 mg/dL (ref 0.3–1.2)
Total Protein: 7 g/dL (ref 6.5–8.1)

## 2019-05-07 LAB — MAGNESIUM: Magnesium: 1.6 mg/dL — ABNORMAL LOW (ref 1.7–2.4)

## 2019-05-07 MED ORDER — SODIUM CHLORIDE 0.9 % IV SOLN
Freq: Once | INTRAVENOUS | Status: AC
  Administered 2019-05-07: 10:00:00 via INTRAVENOUS
  Filled 2019-05-07: qty 250

## 2019-05-07 MED ORDER — SODIUM CHLORIDE 0.9% FLUSH
10.0000 mL | INTRAVENOUS | Status: DC | PRN
  Administered 2019-05-07: 10 mL
  Filled 2019-05-07: qty 10

## 2019-05-07 MED ORDER — FILGRASTIM-SNDZ 480 MCG/0.8ML IJ SOSY
480.0000 ug | PREFILLED_SYRINGE | Freq: Once | INTRAMUSCULAR | Status: AC
  Administered 2019-05-07: 480 ug via SUBCUTANEOUS
  Filled 2019-05-07: qty 0.8

## 2019-05-07 MED ORDER — SODIUM CHLORIDE 0.9 % IV SOLN
Freq: Once | INTRAVENOUS | Status: AC
  Administered 2019-05-07: 10:00:00 via INTRAVENOUS
  Filled 2019-05-07: qty 1000

## 2019-05-07 MED ORDER — HEPARIN SOD (PORK) LOCK FLUSH 100 UNIT/ML IV SOLN
500.0000 [IU] | Freq: Once | INTRAVENOUS | Status: AC | PRN
  Administered 2019-05-07: 500 [IU]
  Filled 2019-05-07: qty 5

## 2019-05-07 MED ORDER — SODIUM CHLORIDE 0.9 % IV SOLN: Freq: Once | INTRAVENOUS | Status: DC

## 2019-05-07 MED ORDER — TBO-FILGRASTIM 480 MCG/0.8ML ~~LOC~~ SOSY
PREFILLED_SYRINGE | SUBCUTANEOUS | Status: AC
  Filled 2019-05-07: qty 0.8

## 2019-05-07 MED ORDER — TBO-FILGRASTIM 480 MCG/0.8ML ~~LOC~~ SOSY: 480.0000 ug | PREFILLED_SYRINGE | Freq: Once | SUBCUTANEOUS | Status: DC

## 2019-05-07 NOTE — Telephone Encounter (Signed)
Appointments adjusted per 6/17 schedule message. Spoke with infusion patient will be given updated schedule in infusion area.

## 2019-05-07 NOTE — Progress Notes (Signed)
[facility] OFFICE PROGRESS NOTE  [name], MD [place] [address]  DIAGNOSIS: Extensive stage (T3, N2, M1a)small cell lung cancer diagnosed in August 2019 and presented with large left hilar/subhilar mass in addition to mediastinal lymphadenopathy and contracture and mass in the right upper lobe.  PRIOR THERAPY:  1) Systemic therapy with carboplatin for AUC of 5 on day 1, etoposide 100 mg/M2 on days 1, 2 and 3 as well as Tecentriq (Atezolizumab) 1200 mg IV every 3 weeks with Neulasta support.  Status post 10 cycles.  Starting from cycle #7, the patient will be treated with only single agent immunotherapy with Tecentriq 1200 mg IV every 3 weeks.  Last dose was given January 27, 2019 discontinued secondary to disease progression. 2) palliative radiotherapy to the mediastinal lymph nodes.  CURRENT THERAPY: Systemic chemotherapy with cisplatin 30 mg/M2 and irinotecan 65 mg/M2 every 3 weeks.  First dose Apr 16, 2019. Status post 1 cycle.   INTERVAL HISTORY: [name] 63 y.o. female returns to the clinic for a follow-up visit.  The patient is feeling fairly well today without any concerning complaints.  She tolerated her first treatment well except for fatigue.  She denies any fever, chills, night sweats, or weight loss.  She denies any chest pain, shortness of breath, cough, or hemoptysis.  She denies any nausea, vomiting, diarrhea, or constipation.  She takes stool softener BID for constipation prevention which has been working well for her. She denies any headache or visual changes.  She is here today for evaluation before starting cycle #2.  MEDICAL HISTORY: Past Medical History:  Diagnosis Date  . Alpha-1-antitrypsin deficiency ([place])   . Cervical cancer ([place]) 2004  . 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 This doesn't fit entirely with emphysema.      . Exposure to hepatitis C 09/12/2016  . Fibromyalgia   . GERD (gastroesophageal reflux disease)   . Hyperlipidemia   . IBS (irritable bowel syndrome)   . Obstructive sleep apnea 06/02/2008   NPSG 06/07/08, AHI 6.9/ hr, weight 220 lbs    . Osteoporosis   . Pulmonary fibrosis, unspecified ([place]) 09/29/2016   CXR 03/28/2016  . Rhinitis, nonallergic 05/08/2012  . 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 Vitamin D deficiency     ALLERGIES:  is allergic to penicillins; actonel [risedronate sodium]; advair diskus [fluticasone-salmeterol]; alendronate sodium; aspirin; azithromycin; keflex [cephalexin]; levofloxacin; morphine; nsaids; omnicef [cefdinir]; and erythromycin.  MEDICATIONS:  Current Outpatient Medications  Medication Sig Dispense Refill  . albuterol (PROVENTIL) (2.5 MG/3ML) 0.083% nebulizer solution Take 3 mLs (2.5 mg total) by nebulization every 6 (six) hours as needed. 90 mL 4  . allopurinol (ZYLOPRIM) 300 MG tablet Take 1 tablet (300 mg total) by mouth daily. Begin on 07/25/2018 14 tablet 0  . amitriptyline (ELAVIL) 75 MG tablet Take 75 mg by mouth at bedtime.      Marland Kitchen demeclocycline (DECLOMYCIN) 150 MG tablet TAKE  (1)  TABLET TWICE A DAY. 60 tablet 0  . feeding supplement, ENSURE ENLIVE, (ENSURE ENLIVE) LIQD Take 237 mLs by mouth 2 (two) times daily between meals. 60 Bottle 0  . fluticasone (FLONASE) 50 MCG/ACT nasal spray USE 2 SPRAYS IN EACH NOSTRIL ONCE DAILY. 16 g 5  . folic acid (FOLVITE) 1 MG tablet  Take 1 mg by mouth daily.    . Glycopyrrolate-Formoterol (BEVESPI AEROSPHERE) 9-4.8 MCG/ACT AERO Inhale 2 puffs into the lungs 2 (two) times daily. 1 Inhaler 12  . HYDROcodone-acetaminophen (NORCO/VICODIN) 5-325 MG tablet Take 1 tablet by mouth 3 (three) times daily. 30 tablet 0  . levothyroxine (SYNTHROID) 75 MCG tablet Take 1 tablet (75 mcg total) by mouth daily before  breakfast. 30 tablet 1  . lidocaine-prilocaine (EMLA) cream Apply 1 application topically as needed. 30 g 0  . Nebulizers (COMPRESSOR NEBULIZER) MISC 1 Device by Does not apply route as needed. 1 each prn  . potassium chloride SA (K-DUR) 20 MEQ tablet Take 1 tablet (20 mEq total) by mouth 2 (two) times daily. 14 tablet 0  . PROAIR HFA 108 (90 Base) MCG/ACT inhaler 2 PUFFS EVERY 4 HOURS AS NEEDED FOR WHEEZING 8.5 g 0  . prochlorperazine (COMPAZINE) 10 MG tablet Take 1 tablet (10 mg total) by mouth every 6 (six) hours as needed for nausea or vomiting. 30 tablet 1  . rosuvastatin (CRESTOR) 40 MG tablet Take 0.5 tablets (20 mg total) by mouth daily. 45 tablet 3  . ezetimibe (ZETIA) 10 MG tablet TAKE 1 TABLET ONCE A DAY 90 tablet 0  . omeprazole (PRILOSEC) 40 MG capsule TAKE (1) CAPSULE DAILY 90 capsule 0   No current facility-administered medications for this visit.    Facility-Administered Medications Ordered in Other Visits  Medication Dose Route Frequency Provider Last Rate Last Dose  . filgrastim-sndz (ZARXIO) injection 480 mcg  480 mcg Subcutaneous Once Curt Bears, MD      . sodium chloride 0.9 % 1,000 mL with potassium chloride 60 mEq, magnesium sulfate 4 g infusion   Intravenous Once Curt Bears, MD 170 mL/hr at 05/07/19 6045      SURGICAL HISTORY:  Past Surgical History:  Procedure Laterality Date  . ABDOMINAL HYSTERECTOMY    . IR IMAGING GUIDED PORT INSERTION  08/02/2018  . LUMBAR DISC SURGERY    . VIDEO BRONCHOSCOPY Bilateral 07/10/2018   Procedure: VIDEO BRONCHOSCOPY WITHOUT FLUORO;  Surgeon: Laurin Coder, MD;  Location: [facility];  Service: Cardiopulmonary;  Laterality: Bilateral;    REVIEW OF SYSTEMS:   Review of Systems  Constitutional: Positive for fatigue. Negative for appetite change, chills, fever and unexpected weight change.  HENT:   Negative for mouth sores, nosebleeds, sore throat and trouble swallowing.   Eyes: Negative for eye problems and  icterus.  Respiratory: Negative for cough, hemoptysis, shortness of breath and wheezing.   Cardiovascular: Negative for chest pain and leg swelling.  Gastrointestinal: Negative for abdominal pain, constipation, diarrhea, nausea and vomiting.  Genitourinary: Negative for bladder incontinence, difficulty urinating, dysuria, frequency and hematuria.   Musculoskeletal: Negative for back pain, gait problem, neck pain and neck stiffness.  Skin: Negative for itching and rash.  Neurological: Negative for dizziness, extremity weakness, gait problem, headaches, light-headedness and seizures.  Hematological: Negative for adenopathy. Does not bruise/bleed easily.  Psychiatric/Behavioral: Negative for confusion, depression and sleep disturbance. The patient is not nervous/anxious.     PHYSICAL EXAMINATION:  Blood pressure 140/79, pulse 83, temperature 98.7 F (37.1 C), temperature source Oral, resp. rate 20, height 5\' 5"  (1.651 m), weight 187 lb 12.8 oz (85.2 kg), SpO2 95 %.  ECOG PERFORMANCE STATUS: 1 - Symptomatic but completely ambulatory  Physical Exam  Constitutional: Oriented to person, place, and time and well-developed, well-nourished, and in no distress.  HENT:  Head: Normocephalic and atraumatic.  Mouth/Throat: Oropharynx is clear and moist. No oropharyngeal  exudate.  Eyes: Conjunctivae are normal. Right eye exhibits no discharge. Left eye exhibits no discharge. No scleral icterus.  Neck: Normal range of motion. Neck supple.  Cardiovascular: Normal rate, regular rhythm, normal heart sounds and intact distal pulses.   Pulmonary/Chest: Effort normal and breath sounds normal. No respiratory distress. No wheezes. No rales.  Abdominal: Soft. Bowel sounds are normal. Exhibits no distension and no mass. There is no tenderness.  Musculoskeletal: Normal range of motion. Exhibits no edema.  Lymphadenopathy:    No cervical adenopathy.  Neurological: Alert and oriented to person, place, and time.  Exhibits normal muscle tone. Gait normal. Coordination normal.  Skin: Skin is warm and dry. No rash noted. Not diaphoretic. No erythema. No pallor.  Psychiatric: Mood, memory and judgment normal.  Vitals reviewed.  LABORATORY DATA: Lab Results  Component Value Date   WBC 1.4 (L) 05/07/2019   HGB 10.0 (L) 05/07/2019   HCT 28.8 (L) 05/07/2019   MCV 92.3 05/07/2019   PLT 65 (L) 05/07/2019      Chemistry      Component Value Date/Time   NA 129 (L) 05/07/2019 0815   NA 124 (L) 07/16/2018 0853   K 2.8 (LL) 05/07/2019 0815   CL 92 (L) 05/07/2019 0815   CO2 26 05/07/2019 0815   BUN 6 05/07/2019 0815   BUN 8 07/16/2018 0853   CREATININE 0.70 05/07/2019 0815   CREATININE 0.68 02/13/2013 1523      Component Value Date/Time   CALCIUM 8.6 (L) 05/07/2019 0815   ALKPHOS 170 (H) 05/07/2019 0815   AST 30 05/07/2019 0815   ALT 24 05/07/2019 0815   BILITOT 0.4 05/07/2019 0815       RADIOGRAPHIC STUDIES:  No results found.   ASSESSMENT/PLAN:  This is a very pleasant 63 year old Caucasian female diagnosed with extensive stage small cell lung cancer.  She presented with a large left perihilar mass as well as mediastinal lymphadenopathy as well as metastatic disease to the liver and bone. She was diagnosed in August 2019.  The patient was previously treated with systemic chemotherapy with carboplatin, etoposide, and Tecentriq.  She is status post 6 cycles.  She then was treated with single agent maintenance Tecentriq for 4 cycles.  This was discontinued due to evidence of disease progression.  She underwent palliative radiotherapy to the progressive disease involving the lung and mediastinum.  She is currently undergoing treatment with cisplatin at a reduced dose of 30 mg/m and irinotecan 65 mg/m on days 1 and 8 every 3 weeks.  She is status post 1 cycle.  She tolerated it well except for fatigue.  The patient was seen with Dr. Julien Nordmann today.  Labs were reviewed with the patient. Her  labs demonstrate neutropenia with an ANC of 0.5 and thrombocytopenia with a platelet count of 65,000. Her potassium was 2.8 today and her magnesium was 1.6.    We will delay her treatment by 1 week. In the meantime, she will receive 60 meq of KCl and 4 g of magnesium sulfate in 1 L of normal saline today for her electrolyte abnormalities.   Regarding her neutropenia, the patient will receive 480 mcg of Granix today and tomorrow.   I will see her back for a follow up visit in 1 week for evaluation prior to starting cycle #2. I will order a restaging CT scan at that time of the chest, abdomen, and pelvis to be performed before cycle #3.   The patient was advised to call immediately if she  has any concerning symptoms in the interval. The patient voices understanding of current disease status and treatment options and is in agreement with the current care plan. All questions were answered. The patient knows to call the clinic with any problems, questions or concerns. We can certainly see the patient much sooner if necessary  No orders of the defined types were placed in this encounter.     L , PA-C 05/07/19  ADDENDUM: Hematology/Oncology Attending: I had a face-to-face encounter with the patient today.  I recommended her care plan.  This is a very pleasant 63 years old white female with extensive stage small cell lung cancer status post first-line systemic chemotherapy with carboplatin, etoposide and Tecentriq followed by maintenance Tecentriq for 4 cycles discontinued secondary to disease progression.  The patient was started on second line treatment with reduced dose cisplatin and irinotecan status post 1 cycle.  She tolerated the first cycle of her treatment well except for pancytopenia as well as lack of appetite and dehydration.  She presented today for evaluation before starting cycle #2.  Her platelets count were low at 65,000.  Her absolute neutrophil count were also at  500. I recommended for the patient to delay her treatment until next week for improvement of her count. For the chemotherapy-induced neutropenia, will give the patient Granix 480 mcg subcutaneously daily for 2 days. For the electrolyte imbalance, we will give the patient IV fluids with potassium supplement and magnesium sulfate. She will come back for follow-up visit next week for reevaluation before resuming her systemic chemotherapy. The patient was advised to call immediately if she has any concerning symptoms in the interval.  Disclaimer: This note was dictated with voice recognition software. Similar sounding words can inadvertently be transcribed and may be missed upon review. Eilleen Kempf, MD 05/07/19

## 2019-05-07 NOTE — Patient Instructions (Signed)
Hypomagnesemia Hypomagnesemia is a condition in which the level of magnesium in the blood is low. Magnesium is a mineral that is found in many foods. It is used in many different processes in the body. Hypomagnesemia can affect every organ in the body. In severe cases, it can cause life-threatening problems. What are the causes? This condition may be caused by:  Not getting enough magnesium in your diet.  Malnutrition.  Problems with absorbing magnesium from the intestines.  Dehydration.  Alcohol abuse.  Vomiting.  Severe or chronic diarrhea.  Some medicines, including medicines that make you urinate more (diuretics).  Certain diseases, such as kidney disease, diabetes, celiac disease, and overactive thyroid. What are the signs or symptoms? Symptoms of this condition include:  Loss of appetite.  Nausea and vomiting.  Involuntary shaking or trembling of a body part (tremor).  Muscle weakness.  Tingling in the arms and legs.  Sudden tightening of muscles (muscle spasms).  Confusion.  Psychiatric issues, such as depression, irritability, or psychosis.  A feeling of fluttering of the heart.  Seizures. These symptoms are more severe if magnesium levels drop suddenly. How is this diagnosed? This condition may be diagnosed based on:  Your symptoms and medical history.  A physical exam.  Blood and urine tests. How is this treated? Treatment depends on the cause and the severity of the condition. It may be treated with:  A magnesium supplement. This can be taken in pill form. If the condition is severe, magnesium is usually given through an IV.  Changes to your diet. You may be directed to eat foods that have a lot of magnesium, such as green leafy vegetables, peas, beans, and nuts.  Stopping any intake of alcohol. Follow these instructions at home:      Make sure that your diet includes foods with magnesium. Foods that have a lot of magnesium in them include:  ? Green leafy vegetables, such as spinach and broccoli. ? Beans and peas. ? Nuts and seeds, such as almonds and sunflower seeds. ? Whole grains, such as whole grain bread and fortified cereals.  Take magnesium supplements if your health care provider tells you to do that. Take them as directed.  Take over-the-counter and prescription medicines only as told by your health care provider.  Have your magnesium levels monitored as told by your health care provider.  When you are active, drink fluids that contain electrolytes.  Avoid drinking alcohol.  Keep all follow-up visits as told by your health care provider. This is important. Contact a health care provider if:  You get worse instead of better.  Your symptoms return. Get help right away if you:  Develop severe muscle weakness.  Have trouble breathing.  Feel that your heart is racing. Summary  Hypomagnesemia is a condition in which the level of magnesium in the blood is low.  Hypomagnesemia can affect every organ in the body.  Treatment may include eating more foods that contain magnesium, taking magnesium supplements, and not drinking alcohol.  Have your magnesium levels monitored as told by your health care provider. This information is not intended to replace advice given to you by your health care provider. Make sure you discuss any questions you have with your health care provider. Document Released: 08/02/2005 Document Revised: 10/08/2017 Document Reviewed: 10/08/2017 Elsevier Interactive Patient Education  2019 [employer]. Hypokalemia Hypokalemia means that the amount of potassium in the blood is lower than normal.Potassium is a chemical that helps regulate the amount of fluid in  the body (electrolyte). It also stimulates muscle tightening (contraction) and helps nerves work properly.Normally, most of the body's potassium is inside of cells, and only a very small amount is in the blood. Because the amount in the  blood is so small, minor changes to potassium levels in the blood can be life-threatening. What are the causes? This condition may be caused by:  Antibiotic medicine.  Diarrhea or vomiting. Taking too much of a medicine that helps you have a bowel movement (laxative) can cause diarrhea and lead to hypokalemia.  Chronic kidney disease (CKD).  Medicines that help the body get rid of excess fluid (diuretics).  Eating disorders, such as bulimia.  Low magnesium levels in the body.  Sweating a lot. What are the signs or symptoms? Symptoms of this condition include:  Weakness.  Constipation.  Fatigue.  Muscle cramps.  Mental confusion.  Skipped heartbeats or irregular heartbeat (palpitations).  Tingling or numbness. How is this diagnosed? This condition is diagnosed with a blood test. How is this treated? Hypokalemia can be treated by taking potassium supplements by mouth or adjusting the medicines that you take. Treatment may also include eating more foods that contain a lot of potassium. If your potassium level is very low, you may need to get potassium through an IV tube in one of your veins and be monitored in the hospital. Follow these instructions at home:   Take over-the-counter and prescription medicines only as told by your health care provider. This includes vitamins and supplements.  Eat a healthy diet. A healthy diet includes fresh fruits and vegetables, whole grains, healthy fats, and lean proteins.  If instructed, eat more foods that contain a lot of potassium, such as: ? Nuts, such as peanuts and pistachios. ? Seeds, such as sunflower seeds and pumpkin seeds. ? Peas, lentils, and lima beans. ? Whole grain and bran cereals and breads. ? Fresh fruits and vegetables, such as apricots, avocado, bananas, cantaloupe, kiwi, oranges, tomatoes, asparagus, and potatoes. ? Orange juice. ? Tomato juice. ? Red meats. ? Yogurt.  Keep all follow-up visits as told by  your health care provider. This is important. Contact a health care provider if:  You have weakness that gets worse.  You feel your heart pounding or racing.  You vomit.  You have diarrhea.  You have diabetes (diabetes mellitus) and you have trouble keeping your blood sugar (glucose) in your target range. Get help right away if:  You have chest pain.  You have shortness of breath.  You have vomiting or diarrhea that lasts for more than 2 days.  You faint. This information is not intended to replace advice given to you by your health care provider. Make sure you discuss any questions you have with your health care provider. Document Released: 11/06/2005 Document Revised: 06/24/2016 Document Reviewed: 06/24/2016 Elsevier Interactive Patient Education  2019 [employer].

## 2019-05-08 ENCOUNTER — Inpatient Hospital Stay: Payer: Medicare Other

## 2019-05-08 ENCOUNTER — Other Ambulatory Visit: Payer: Self-pay

## 2019-05-08 VITALS — BP 138/80 | HR 88 | Temp 98.2°F | Resp 18

## 2019-05-08 DIAGNOSIS — Z95828 Presence of other vascular implants and grafts: Secondary | ICD-10-CM

## 2019-05-08 DIAGNOSIS — Z5189 Encounter for other specified aftercare: Secondary | ICD-10-CM | POA: Diagnosis not present

## 2019-05-08 DIAGNOSIS — Z79899 Other long term (current) drug therapy: Secondary | ICD-10-CM | POA: Diagnosis not present

## 2019-05-08 DIAGNOSIS — D696 Thrombocytopenia, unspecified: Secondary | ICD-10-CM | POA: Diagnosis not present

## 2019-05-08 DIAGNOSIS — C3492 Malignant neoplasm of unspecified part of left bronchus or lung: Secondary | ICD-10-CM | POA: Diagnosis not present

## 2019-05-08 DIAGNOSIS — Z5111 Encounter for antineoplastic chemotherapy: Secondary | ICD-10-CM | POA: Diagnosis not present

## 2019-05-08 DIAGNOSIS — C7951 Secondary malignant neoplasm of bone: Secondary | ICD-10-CM | POA: Diagnosis not present

## 2019-05-08 DIAGNOSIS — E559 Vitamin D deficiency, unspecified: Secondary | ICD-10-CM | POA: Diagnosis not present

## 2019-05-08 DIAGNOSIS — J449 Chronic obstructive pulmonary disease, unspecified: Secondary | ICD-10-CM | POA: Diagnosis not present

## 2019-05-08 DIAGNOSIS — C787 Secondary malignant neoplasm of liver and intrahepatic bile duct: Secondary | ICD-10-CM | POA: Diagnosis not present

## 2019-05-08 DIAGNOSIS — E785 Hyperlipidemia, unspecified: Secondary | ICD-10-CM | POA: Diagnosis not present

## 2019-05-08 DIAGNOSIS — Z7951 Long term (current) use of inhaled steroids: Secondary | ICD-10-CM | POA: Diagnosis not present

## 2019-05-08 MED ORDER — FILGRASTIM-SNDZ 480 MCG/0.8ML IJ SOSY
480.0000 ug | PREFILLED_SYRINGE | Freq: Once | INTRAMUSCULAR | Status: AC
  Administered 2019-05-08: 480 ug via SUBCUTANEOUS
  Filled 2019-05-08: qty 0.8

## 2019-05-14 ENCOUNTER — Other Ambulatory Visit: Payer: Self-pay

## 2019-05-14 ENCOUNTER — Encounter: Payer: Self-pay | Admitting: Physician Assistant

## 2019-05-14 ENCOUNTER — Inpatient Hospital Stay: Payer: Medicare Other

## 2019-05-14 ENCOUNTER — Inpatient Hospital Stay (HOSPITAL_BASED_OUTPATIENT_CLINIC_OR_DEPARTMENT_OTHER): Payer: Medicare Other | Admitting: Physician Assistant

## 2019-05-14 VITALS — BP 148/84 | HR 85 | Temp 97.7°F | Resp 17 | Ht 65.0 in | Wt 188.9 lb

## 2019-05-14 DIAGNOSIS — C3492 Malignant neoplasm of unspecified part of left bronchus or lung: Secondary | ICD-10-CM

## 2019-05-14 DIAGNOSIS — Z5111 Encounter for antineoplastic chemotherapy: Secondary | ICD-10-CM

## 2019-05-14 DIAGNOSIS — Z5189 Encounter for other specified aftercare: Secondary | ICD-10-CM | POA: Diagnosis not present

## 2019-05-14 DIAGNOSIS — T451X5A Adverse effect of antineoplastic and immunosuppressive drugs, initial encounter: Secondary | ICD-10-CM

## 2019-05-14 DIAGNOSIS — D696 Thrombocytopenia, unspecified: Secondary | ICD-10-CM | POA: Diagnosis not present

## 2019-05-14 DIAGNOSIS — Z79899 Other long term (current) drug therapy: Secondary | ICD-10-CM | POA: Diagnosis not present

## 2019-05-14 DIAGNOSIS — E785 Hyperlipidemia, unspecified: Secondary | ICD-10-CM | POA: Diagnosis not present

## 2019-05-14 DIAGNOSIS — E559 Vitamin D deficiency, unspecified: Secondary | ICD-10-CM

## 2019-05-14 DIAGNOSIS — J449 Chronic obstructive pulmonary disease, unspecified: Secondary | ICD-10-CM

## 2019-05-14 DIAGNOSIS — Z7951 Long term (current) use of inhaled steroids: Secondary | ICD-10-CM

## 2019-05-14 DIAGNOSIS — Z95828 Presence of other vascular implants and grafts: Secondary | ICD-10-CM

## 2019-05-14 DIAGNOSIS — Z8541 Personal history of malignant neoplasm of cervix uteri: Secondary | ICD-10-CM

## 2019-05-14 DIAGNOSIS — C7951 Secondary malignant neoplasm of bone: Secondary | ICD-10-CM | POA: Diagnosis not present

## 2019-05-14 DIAGNOSIS — D61810 Antineoplastic chemotherapy induced pancytopenia: Secondary | ICD-10-CM

## 2019-05-14 DIAGNOSIS — C787 Secondary malignant neoplasm of liver and intrahepatic bile duct: Secondary | ICD-10-CM | POA: Diagnosis not present

## 2019-05-14 LAB — CBC WITH DIFFERENTIAL (CANCER CENTER ONLY)
Abs Immature Granulocytes: 0.05 10*3/uL (ref 0.00–0.07)
Basophils Absolute: 0 10*3/uL (ref 0.0–0.1)
Basophils Relative: 1 %
Eosinophils Absolute: 0.1 10*3/uL (ref 0.0–0.5)
Eosinophils Relative: 2 %
HCT: 30.5 % — ABNORMAL LOW (ref 36.0–46.0)
Hemoglobin: 10.5 g/dL — ABNORMAL LOW (ref 12.0–15.0)
Immature Granulocytes: 2 %
Lymphocytes Relative: 22 %
Lymphs Abs: 0.6 10*3/uL — ABNORMAL LOW (ref 0.7–4.0)
MCH: 32.3 pg (ref 26.0–34.0)
MCHC: 34.4 g/dL (ref 30.0–36.0)
MCV: 93.8 fL (ref 80.0–100.0)
Monocytes Absolute: 0.8 10*3/uL (ref 0.1–1.0)
Monocytes Relative: 30 %
Neutro Abs: 1.2 10*3/uL — ABNORMAL LOW (ref 1.7–7.7)
Neutrophils Relative %: 43 %
Platelet Count: 101 10*3/uL — ABNORMAL LOW (ref 150–400)
RBC: 3.25 MIL/uL — ABNORMAL LOW (ref 3.87–5.11)
RDW: 19.9 % — ABNORMAL HIGH (ref 11.5–15.5)
WBC Count: 2.7 10*3/uL — ABNORMAL LOW (ref 4.0–10.5)
nRBC: 0 % (ref 0.0–0.2)

## 2019-05-14 LAB — CMP (CANCER CENTER ONLY)
ALT: 26 U/L (ref 0–44)
AST: 39 U/L (ref 15–41)
Albumin: 3.7 g/dL (ref 3.5–5.0)
Alkaline Phosphatase: 188 U/L — ABNORMAL HIGH (ref 38–126)
Anion gap: 10 (ref 5–15)
BUN: 6 mg/dL (ref 6–20)
CO2: 26 mmol/L (ref 22–32)
Calcium: 8.8 mg/dL — ABNORMAL LOW (ref 8.9–10.3)
Chloride: 91 mmol/L — ABNORMAL LOW (ref 98–111)
Creatinine: 0.65 mg/dL (ref 0.44–1.00)
GFR, Est AFR Am: >60 mL/min (ref >60)
GFR, Estimated: >60 mL/min (ref >60)
Glucose, Bld: 102 mg/dL — ABNORMAL HIGH (ref 70–99)
Potassium: 3.5 mmol/L (ref 3.5–5.1)
Sodium: 127 mmol/L — ABNORMAL LOW (ref 135–145)
Total Bilirubin: 0.3 mg/dL (ref 0.3–1.2)
Total Protein: 7.2 g/dL (ref 6.5–8.1)

## 2019-05-14 LAB — MAGNESIUM: Magnesium: 1.7 mg/dL (ref 1.7–2.4)

## 2019-05-14 MED ORDER — SODIUM CHLORIDE 0.9 % IV SOLN
30.0000 mg/m2 | Freq: Once | INTRAVENOUS | Status: AC
  Administered 2019-05-14: 59 mg via INTRAVENOUS
  Filled 2019-05-14: qty 59

## 2019-05-14 MED ORDER — SODIUM CHLORIDE 0.9 % IV SOLN
Freq: Once | INTRAVENOUS | Status: AC
  Administered 2019-05-14: 13:00:00 via INTRAVENOUS
  Filled 2019-05-14: qty 5

## 2019-05-14 MED ORDER — SODIUM CHLORIDE 0.9 % IV SOLN
Freq: Once | INTRAVENOUS | Status: AC
  Administered 2019-05-14: 10:00:00 via INTRAVENOUS
  Filled 2019-05-14: qty 250

## 2019-05-14 MED ORDER — POTASSIUM CHLORIDE 2 MEQ/ML IV SOLN
Freq: Once | INTRAVENOUS | Status: AC
  Administered 2019-05-14: 11:00:00 via INTRAVENOUS
  Filled 2019-05-14: qty 10

## 2019-05-14 MED ORDER — SODIUM CHLORIDE 0.9% FLUSH
10.0000 mL | INTRAVENOUS | Status: DC | PRN
  Administered 2019-05-14: 10 mL
  Filled 2019-05-14: qty 10

## 2019-05-14 MED ORDER — IRINOTECAN HCL CHEMO INJECTION 100 MG/5ML
65.0000 mg/m2 | Freq: Once | INTRAVENOUS | Status: AC
  Administered 2019-05-14: 120 mg via INTRAVENOUS
  Filled 2019-05-14: qty 6

## 2019-05-14 MED ORDER — HEPARIN SOD (PORK) LOCK FLUSH 100 UNIT/ML IV SOLN
500.0000 [IU] | Freq: Once | INTRAVENOUS | Status: AC | PRN
  Administered 2019-05-14: 18:00:00 500 [IU]
  Filled 2019-05-14: qty 5

## 2019-05-14 MED ORDER — PALONOSETRON HCL INJECTION 0.25 MG/5ML
0.2500 mg | Freq: Once | INTRAVENOUS | Status: AC
  Administered 2019-05-14: 13:00:00 0.25 mg via INTRAVENOUS

## 2019-05-14 MED ORDER — PALONOSETRON HCL INJECTION 0.25 MG/5ML
INTRAVENOUS | Status: AC
  Filled 2019-05-14: qty 5

## 2019-05-14 NOTE — Progress Notes (Signed)
Okay to treat with ANC of 1.2 per Cassandra Heilingoetter, PA-C, and OK to proceed with hydration fluids prior to CMP.    OK to run post hydration fluids with Cisplatin per Dr. Julien Nordmann.

## 2019-05-14 NOTE — Patient Instructions (Signed)
[facility] Discharge Instructions for Patients Receiving Chemotherapy  Today you received the following chemotherapy agents:  Irinotecan and Cisplatin.  To help prevent nausea and vomiting after your treatment, we encourage you to take your nausea medication as directed.   If you develop nausea and vomiting that is not controlled by your nausea medication, call the clinic.   BELOW ARE SYMPTOMS THAT SHOULD BE REPORTED IMMEDIATELY:  *FEVER GREATER THAN 100.5 F  *CHILLS WITH OR WITHOUT FEVER  NAUSEA AND VOMITING THAT IS NOT CONTROLLED WITH YOUR NAUSEA MEDICATION  *UNUSUAL SHORTNESS OF BREATH  *UNUSUAL BRUISING OR BLEEDING  TENDERNESS IN MOUTH AND THROAT WITH OR WITHOUT PRESENCE OF ULCERS  *URINARY PROBLEMS  *BOWEL PROBLEMS  UNUSUAL RASH Items with * indicate a potential emergency and should be followed up as soon as possible.  Feel free to call the clinic should you have any questions or concerns. The clinic phone number is [phone].  Please show the [facility] at check-in to the Emergency Department and triage nurse.

## 2019-05-14 NOTE — Progress Notes (Signed)
[facility] OFFICE PROGRESS NOTE  [name], MD [place] [address]  DIAGNOSIS: Extensive stage (T3, N2, M1a)small cell lung cancer diagnosed in August 2019 and presented with large left hilar/subhilar mass in addition to mediastinal lymphadenopathy and contracture and mass in the right upper lobe.  PRIOR THERAPY: 1)Systemic therapy with carboplatin for AUC of 5 on day 1, etoposide 100 mg/M2 on days 1, 2 and 3 as well as Tecentriq (Atezolizumab) 1200 mg IV every 3 weeks with Neulasta support. Status post 10 cycles. Starting from cycle #7, the patient will be treated with only single agent immunotherapy with Tecentriq 1200 mg IV every 3 weeks. Last dose was given January 27, 2019 discontinued secondary to disease progression. 2)palliative radiotherapy to the mediastinal lymph nodes.  CURRENT THERAPY: Systemic chemotherapy with cisplatin 30 mg/M2 and irinotecan 65 mg/M2 every 3 weeks. First dose Apr 16, 2019. Status post 2 cycles.  INTERVAL HISTORY: [name] 63 y.o. female returns to the clinic for a follow up visit.  The patient is feeling well today without any concerning complaints.  She tolerated her last treatment well without any adverse side effects except for fatigue.  Day 1 of cycle #2 was delayed by 1 week secondary to thrombocytopenia and neutropenia. The patient received 2 doses of Granix last week.  She also had some electrolyte disturbances on her routine labs last week. She was given supplemental magnesium and potassium IV. The patient denies any new symptoms in the interval.  She denies any fever, chills, night sweats, or weight loss.  She denies any chest pain, shortness of breath, cough, or hemoptysis.  She denies any nausea, vomiting, diarrhea, or constipation.  She denies any headache or visual changes.  She is here today for evaluation before starting cycle #2.   MEDICAL HISTORY: Past Medical History:  Diagnosis Date  .  Alpha-1-antitrypsin deficiency ([place])   . Cervical cancer ([place]) 2004  . 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 This doesn't fit entirely with emphysema.      . Exposure to hepatitis C 09/12/2016  . Fibromyalgia   . GERD (gastroesophageal reflux disease)   . Hyperlipidemia   . IBS (irritable bowel syndrome)   . Obstructive sleep apnea 06/02/2008   NPSG 06/07/08, AHI 6.9/ hr, weight 220 lbs    . Osteoporosis   . Pulmonary fibrosis, unspecified ([place]) 09/29/2016   CXR 03/28/2016  . Rhinitis, nonallergic 05/08/2012  . 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 Vitamin D deficiency     ALLERGIES:  is allergic to penicillins; actonel [risedronate sodium]; advair diskus [fluticasone-salmeterol]; alendronate sodium; aspirin; azithromycin; keflex [cephalexin]; levofloxacin; morphine; nsaids; omnicef [cefdinir]; and erythromycin.  MEDICATIONS:  Current Outpatient Medications  Medication Sig Dispense Refill  . albuterol (PROVENTIL) (2.5 MG/3ML) 0.083% nebulizer solution Take 3 mLs (2.5 mg total) by nebulization every 6 (six) hours as needed. 90 mL 4  . allopurinol (ZYLOPRIM) 300 MG tablet Take 1 tablet (300 mg total) by mouth daily. Begin on 07/25/2018 14 tablet 0  . amitriptyline (ELAVIL) 75 MG tablet Take 75 mg by mouth at bedtime.      Marland Kitchen demeclocycline (DECLOMYCIN) 150 MG tablet TAKE  (1)  TABLET TWICE A DAY. 60 tablet 0  . ezetimibe (ZETIA) 10 MG tablet TAKE 1 TABLET ONCE A  DAY 90 tablet 0  . feeding supplement, ENSURE ENLIVE, (ENSURE ENLIVE) LIQD Take 237 mLs by mouth 2 (two) times daily between meals. 60 Bottle 0  . fluticasone (FLONASE) 50 MCG/ACT nasal spray USE 2 SPRAYS IN EACH NOSTRIL ONCE DAILY. 16 g 5  . folic acid (FOLVITE) 1 MG tablet Take 1 mg by mouth daily.    .  Glycopyrrolate-Formoterol (BEVESPI AEROSPHERE) 9-4.8 MCG/ACT AERO Inhale 2 puffs into the lungs 2 (two) times daily. 1 Inhaler 12  . HYDROcodone-acetaminophen (NORCO/VICODIN) 5-325 MG tablet Take 1 tablet by mouth 3 (three) times daily. 30 tablet 0  . levothyroxine (SYNTHROID) 75 MCG tablet Take 1 tablet (75 mcg total) by mouth daily before breakfast. 30 tablet 1  . lidocaine-prilocaine (EMLA) cream Apply 1 application topically as needed. 30 g 0  . Nebulizers (COMPRESSOR NEBULIZER) MISC 1 Device by Does not apply route as needed. 1 each prn  . omeprazole (PRILOSEC) 40 MG capsule TAKE (1) CAPSULE DAILY 90 capsule 0  . potassium chloride SA (K-DUR) 20 MEQ tablet Take 1 tablet (20 mEq total) by mouth 2 (two) times daily. 14 tablet 0  . PROAIR HFA 108 (90 Base) MCG/ACT inhaler 2 PUFFS EVERY 4 HOURS AS NEEDED FOR WHEEZING 8.5 g 0  . rosuvastatin (CRESTOR) 40 MG tablet Take 0.5 tablets (20 mg total) by mouth daily. 45 tablet 3  . prochlorperazine (COMPAZINE) 10 MG tablet Take 1 tablet (10 mg total) by mouth every 6 (six) hours as needed for nausea or vomiting. 30 tablet 1   No current facility-administered medications for this visit.    Facility-Administered Medications Ordered in Other Visits  Medication Dose Route Frequency Provider Last Rate Last Dose  . CISplatin (PLATINOL) 59 mg in sodium chloride 0.9 % 250 mL chemo infusion  30 mg/m2 (Treatment Plan Recorded) Intravenous Once Curt Bears, MD      . fosaprepitant (EMEND) 150 mg, dexamethasone (DECADRON) 12 mg in sodium chloride 0.9 % 145 mL IVPB   Intravenous Once Curt Bears, MD      . heparin lock flush 100 unit/mL  500 Units Intracatheter Once PRN Curt Bears, MD      . irinotecan (CAMPTOSAR) 120 mg in dextrose 5 % 500 mL chemo infusion  65 mg/m2 (Treatment Plan Recorded) Intravenous Once Curt Bears, MD      . palonosetron (ALOXI) injection 0.25 mg  0.25 mg Intravenous Once Curt Bears, MD      . sodium chloride flush  (NS) 0.9 % injection 10 mL  10 mL Intracatheter PRN Curt Bears, MD        SURGICAL HISTORY:  Past Surgical History:  Procedure Laterality Date  . ABDOMINAL HYSTERECTOMY    . IR IMAGING GUIDED PORT INSERTION  08/02/2018  . LUMBAR DISC SURGERY    . VIDEO BRONCHOSCOPY Bilateral 07/10/2018   Procedure: VIDEO BRONCHOSCOPY WITHOUT FLUORO;  Surgeon: Laurin Coder, MD;  Location: [facility];  Service: Cardiopulmonary;  Laterality: Bilateral;    REVIEW OF SYSTEMS:   Review of Systems  Constitutional: Positive for fatigue. Negative for appetite change, chills, fever and unexpected weight change.  HENT:   Negative for mouth sores, nosebleeds, sore throat and trouble swallowing.   Eyes: Negative for eye problems and icterus.  Respiratory: Negative for cough, hemoptysis, shortness of breath and wheezing.   Cardiovascular: Negative for chest pain and leg swelling.  Gastrointestinal: Negative for abdominal pain, constipation, diarrhea, nausea and vomiting.  Genitourinary: Negative for bladder incontinence, difficulty urinating, dysuria, frequency and hematuria.  Musculoskeletal: Negative for back pain, gait problem, neck pain and neck stiffness.  Skin: Negative for itching and rash.  Neurological: Negative for dizziness, extremity weakness, gait problem, headaches, light-headedness and seizures.  Hematological: Negative for adenopathy. Does not bruise/bleed easily.  Psychiatric/Behavioral: Negative for confusion, depression and sleep disturbance. The patient is not nervous/anxious.     PHYSICAL EXAMINATION:  Blood pressure (!) 148/84, pulse 85, temperature 97.7 F (36.5 C), temperature source Oral, resp. rate 17, height 5\' 5"  (1.651 m), weight 188 lb 14.4 oz (85.7 kg), SpO2 94 %.  ECOG PERFORMANCE STATUS: 1 - Symptomatic but completely ambulatory  Physical Exam  Constitutional: Oriented to person, place, and time and well-developed, well-nourished, and in no distress.  HENT:  Head:  Normocephalic and atraumatic.  Mouth/Throat: Oropharynx is clear and moist. No oropharyngeal exudate.  Eyes: Conjunctivae are normal. Right eye exhibits no discharge. Left eye exhibits no discharge. No scleral icterus.  Neck: Normal range of motion. Neck supple.  Cardiovascular: Normal rate, regular rhythm, normal heart sounds and intact distal pulses.   Pulmonary/Chest: Effort normal and breath sounds normal. No respiratory distress. No wheezes. No rales.  Abdominal: Soft. Bowel sounds are normal. Exhibits no distension and no mass. There is no tenderness.  Musculoskeletal: Normal range of motion. Exhibits no edema.  Lymphadenopathy:    No cervical adenopathy.  Neurological: Alert and oriented to person, place, and time. Exhibits normal muscle tone. Gait normal. Coordination normal.  Skin: Skin is warm and dry. No rash noted. Not diaphoretic. No erythema. No pallor.  Psychiatric: Mood, memory and judgment normal.  Vitals reviewed.  LABORATORY DATA: Lab Results  Component Value Date   WBC 2.7 (L) 05/14/2019   HGB 10.5 (L) 05/14/2019   HCT 30.5 (L) 05/14/2019   MCV 93.8 05/14/2019   PLT 101 (L) 05/14/2019      Chemistry      Component Value Date/Time   NA 127 (L) 05/14/2019 0822   NA 124 (L) 07/16/2018 0853   K 3.5 05/14/2019 0822   CL 91 (L) 05/14/2019 0822   CO2 26 05/14/2019 0822   BUN 6 05/14/2019 0822   BUN 8 07/16/2018 0853   CREATININE 0.65 05/14/2019 0822   CREATININE 0.68 02/13/2013 1523      Component Value Date/Time   CALCIUM 8.8 (L) 05/14/2019 0822   ALKPHOS 188 (H) 05/14/2019 0822   AST 39 05/14/2019 0822   ALT 26 05/14/2019 0822   BILITOT 0.3 05/14/2019 0822       RADIOGRAPHIC STUDIES:  No results found.   ASSESSMENT/PLAN:  This is a very pleasant 63 year old Caucasian female diagnosed with extensive stage small cell lung cancer.  She presented with a large left perihilar mass as well as mediastinal lymphadenopathy as well as metastatic disease to  the liver and bone. She was diagnosed in August 2019.  The patient was previously treated with systemic chemotherapy with carboplatin, etoposide, and Tecentriq.  She is status post 6 cycles.  She then was treated with single agent maintenance Tecentriq for 4 cycles.  This was discontinued due to evidence of disease progression.  She underwent palliative radiotherapy to the progressive disease involving the lung and mediastinum.  She is currently undergoing treatment with cisplatin at a reduced dose of 30 mg/m and irinotecan 65 mg/m on days 1 and 8 every 3 weeks.  She is status post 1 cycle.  She tolerated it well except for fatigue, electrolyte disturbances, and pancytopenia requiring granix.   The patient was seen with Dr. Julien Nordmann  today. Labs were reviewed. She still have improved but persistent thrombocytopenia with a platelet count of 101,000 and ANC of 1.2. Her labs are within a adequate range for treatment. We will continue to monitor her labs closely on routine labs. We recommend that she receive cycle #2 today as scheduled.   I will arrange for a restaging CT scan of the chest, abdomen, and pelvis to be performed prior to her next visit.   We will see her back for a follow up visit in 3 weeks for evaluation and to review her scan before starting cycle #3.   The patient was advised to call immediately if she has any concerning symptoms in the interval. The patient voices understanding of current disease status and treatment options and is in agreement with the current care plan. All questions were answered. The patient knows to call the clinic with any problems, questions or concerns. We can certainly see the patient much sooner if necessary   Orders Placed This Encounter  Procedures  . CT Chest W Contrast    Standing Status:   Future    Standing Expiration Date:   05/13/2020    Order Specific Question:   ** REASON FOR EXAM (FREE TEXT)    Answer:   Restaging Lung Cancer    Order  Specific Question:   If indicated for the ordered procedure, I authorize the administration of contrast media per Radiology protocol    Answer:   Yes    Order Specific Question:   Is patient pregnant?    Answer:   No    Order Specific Question:   Preferred imaging location?    Answer:   [facility]    Order Specific Question:   Radiology Contrast Protocol - do NOT remove file path    Answer:   \\[email or web address]  . CT Abdomen Pelvis W Contrast    Standing Status:   Future    Standing Expiration Date:   05/13/2020    Order Specific Question:   ** REASON FOR EXAM (FREE TEXT)    Answer:   Restaging Lung Cancer    Order Specific Question:   If indicated for the ordered procedure, I authorize the administration of contrast media per Radiology protocol    Answer:   Yes    Order Specific Question:   Is patient pregnant?    Answer:   No    Order Specific Question:   Preferred imaging location?    Answer:   [facility]    Order Specific Question:   Is Oral Contrast requested for this exam?    Answer:   Yes, Per Radiology protocol    Order Specific Question:   Radiology Contrast Protocol - do NOT remove file path    Answer:   \\[email or web address]  . CBC with Differential ([facility] Only)    Standing Status:   Future    Number of Occurrences:   1    Standing Expiration Date:   05/13/2020  . CMP ([facility] only)    Standing Status:   Future    Number of Occurrences:   1    Standing Expiration Date:   05/13/2020     Tobe Sos Leandrea Ackley, PA-C 05/14/19  ADDENDUM: Hematology/Oncology Attending: I had a face-to-face encounter with the patient today.  I recommended her care plan.  This is a very pleasant 63 years old white female with extensive stage small cell lung cancer status post first-line systemic chemotherapy with immunotherapy with initial  partial response followed by progression.  She is currently undergoing  second line with reduced dose cisplatin and irinotecan status post 1 cycle. She tolerated the first cycle of her treatment well except for pancytopenia.  Her second cycle was delayed by 1 week until improvement of her platelets counts.  She also received IV fluids and electrolyte replacement last week.  The patient is feeling much better today.  I recommended for her to proceed with cycle #2 today as planned. She will come back for follow-up visit every 3 weeks for evaluation with repeat CT scan of the chest, abdomen and pelvis for restaging of her disease. The patient was advised to call immediately if she has any concerning symptoms in the interval.  Disclaimer: This note was dictated with voice recognition software. Similar sounding words can inadvertently be transcribed and may be missed upon review. Eilleen Kempf, MD 05/14/19

## 2019-05-15 ENCOUNTER — Telehealth: Payer: Self-pay | Admitting: Internal Medicine

## 2019-05-15 NOTE — Telephone Encounter (Signed)
Added additional cycles per 6/24 los - pt to get an updated schedule next visit.

## 2019-05-20 DIAGNOSIS — C3492 Malignant neoplasm of unspecified part of left bronchus or lung: Secondary | ICD-10-CM | POA: Diagnosis not present

## 2019-05-20 DIAGNOSIS — J189 Pneumonia, unspecified organism: Secondary | ICD-10-CM | POA: Diagnosis not present

## 2019-05-21 ENCOUNTER — Inpatient Hospital Stay: Payer: Medicare Other | Attending: Internal Medicine

## 2019-05-21 ENCOUNTER — Inpatient Hospital Stay: Payer: Medicare Other

## 2019-05-21 ENCOUNTER — Other Ambulatory Visit: Payer: Self-pay | Admitting: Internal Medicine

## 2019-05-21 ENCOUNTER — Other Ambulatory Visit: Payer: Self-pay

## 2019-05-21 VITALS — BP 145/76 | HR 86 | Temp 98.9°F | Resp 16 | Ht 65.0 in | Wt 183.2 lb

## 2019-05-21 DIAGNOSIS — Z95828 Presence of other vascular implants and grafts: Secondary | ICD-10-CM

## 2019-05-21 DIAGNOSIS — Z8541 Personal history of malignant neoplasm of cervix uteri: Secondary | ICD-10-CM | POA: Diagnosis not present

## 2019-05-21 DIAGNOSIS — C7951 Secondary malignant neoplasm of bone: Secondary | ICD-10-CM | POA: Diagnosis not present

## 2019-05-21 DIAGNOSIS — C3492 Malignant neoplasm of unspecified part of left bronchus or lung: Secondary | ICD-10-CM

## 2019-05-21 DIAGNOSIS — D61818 Other pancytopenia: Secondary | ICD-10-CM | POA: Diagnosis not present

## 2019-05-21 DIAGNOSIS — C787 Secondary malignant neoplasm of liver and intrahepatic bile duct: Secondary | ICD-10-CM | POA: Insufficient documentation

## 2019-05-21 DIAGNOSIS — Z5111 Encounter for antineoplastic chemotherapy: Secondary | ICD-10-CM | POA: Insufficient documentation

## 2019-05-21 DIAGNOSIS — J449 Chronic obstructive pulmonary disease, unspecified: Secondary | ICD-10-CM | POA: Diagnosis not present

## 2019-05-21 DIAGNOSIS — E785 Hyperlipidemia, unspecified: Secondary | ICD-10-CM | POA: Insufficient documentation

## 2019-05-21 DIAGNOSIS — Z79899 Other long term (current) drug therapy: Secondary | ICD-10-CM | POA: Diagnosis not present

## 2019-05-21 DIAGNOSIS — E559 Vitamin D deficiency, unspecified: Secondary | ICD-10-CM | POA: Diagnosis not present

## 2019-05-21 DIAGNOSIS — E876 Hypokalemia: Secondary | ICD-10-CM | POA: Insufficient documentation

## 2019-05-21 DIAGNOSIS — D696 Thrombocytopenia, unspecified: Secondary | ICD-10-CM | POA: Diagnosis not present

## 2019-05-21 DIAGNOSIS — E871 Hypo-osmolality and hyponatremia: Secondary | ICD-10-CM | POA: Insufficient documentation

## 2019-05-21 DIAGNOSIS — Z7951 Long term (current) use of inhaled steroids: Secondary | ICD-10-CM | POA: Insufficient documentation

## 2019-05-21 LAB — CBC WITH DIFFERENTIAL (CANCER CENTER ONLY)
Abs Immature Granulocytes: 0 10*3/uL (ref 0.00–0.07)
Basophils Absolute: 0 10*3/uL (ref 0.0–0.1)
Basophils Relative: 1 %
Eosinophils Absolute: 0 10*3/uL (ref 0.0–0.5)
Eosinophils Relative: 1 %
HCT: 27.1 % — ABNORMAL LOW (ref 36.0–46.0)
Hemoglobin: 9.7 g/dL — ABNORMAL LOW (ref 12.0–15.0)
Immature Granulocytes: 0 %
Lymphocytes Relative: 30 %
Lymphs Abs: 0.6 10*3/uL — ABNORMAL LOW (ref 0.7–4.0)
MCH: 33.3 pg (ref 26.0–34.0)
MCHC: 35.8 g/dL (ref 30.0–36.0)
MCV: 93.1 fL (ref 80.0–100.0)
Monocytes Absolute: 0.3 10*3/uL (ref 0.1–1.0)
Monocytes Relative: 13 %
Neutro Abs: 1.1 10*3/uL — ABNORMAL LOW (ref 1.7–7.7)
Neutrophils Relative %: 55 %
Platelet Count: 83 10*3/uL — ABNORMAL LOW (ref 150–400)
RBC: 2.91 MIL/uL — ABNORMAL LOW (ref 3.87–5.11)
RDW: 18.6 % — ABNORMAL HIGH (ref 11.5–15.5)
WBC Count: 2 10*3/uL — ABNORMAL LOW (ref 4.0–10.5)
nRBC: 0 % (ref 0.0–0.2)

## 2019-05-21 LAB — CMP (CANCER CENTER ONLY)
ALT: 24 U/L (ref 0–44)
AST: 33 U/L (ref 15–41)
Albumin: 3.7 g/dL (ref 3.5–5.0)
Alkaline Phosphatase: 188 U/L — ABNORMAL HIGH (ref 38–126)
Anion gap: 10 (ref 5–15)
BUN: 6 mg/dL — ABNORMAL LOW (ref 8–23)
CO2: 27 mmol/L (ref 22–32)
Calcium: 8.7 mg/dL — ABNORMAL LOW (ref 8.9–10.3)
Chloride: 90 mmol/L — ABNORMAL LOW (ref 98–111)
Creatinine: 0.65 mg/dL (ref 0.44–1.00)
GFR, Est AFR Am: >60 mL/min (ref >60)
GFR, Estimated: >60 mL/min (ref >60)
Glucose, Bld: 106 mg/dL — ABNORMAL HIGH (ref 70–99)
Potassium: 3.4 mmol/L — ABNORMAL LOW (ref 3.5–5.1)
Sodium: 127 mmol/L — ABNORMAL LOW (ref 135–145)
Total Bilirubin: 0.5 mg/dL (ref 0.3–1.2)
Total Protein: 7.1 g/dL (ref 6.5–8.1)

## 2019-05-21 LAB — MAGNESIUM: Magnesium: 1.7 mg/dL (ref 1.7–2.4)

## 2019-05-21 MED ORDER — SODIUM CHLORIDE 0.9 % IV SOLN
Freq: Once | INTRAVENOUS | Status: AC
  Administered 2019-05-21: 09:00:00 via INTRAVENOUS
  Filled 2019-05-21: qty 250

## 2019-05-21 MED ORDER — HEPARIN SOD (PORK) LOCK FLUSH 100 UNIT/ML IV SOLN
500.0000 [IU] | Freq: Once | INTRAVENOUS | Status: AC | PRN
  Administered 2019-05-21: 18:00:00 500 [IU]
  Filled 2019-05-21: qty 5

## 2019-05-21 MED ORDER — POTASSIUM CHLORIDE 2 MEQ/ML IV SOLN
Freq: Once | INTRAVENOUS | Status: AC
  Administered 2019-05-21: 10:00:00 via INTRAVENOUS
  Filled 2019-05-21: qty 20

## 2019-05-21 MED ORDER — ATROPINE SULFATE 1 MG/ML IJ SOLN: 0.5000 mg | Freq: Once | INTRAMUSCULAR | Status: DC | PRN

## 2019-05-21 MED ORDER — PALONOSETRON HCL INJECTION 0.25 MG/5ML
0.2500 mg | Freq: Once | INTRAVENOUS | Status: AC
  Administered 2019-05-21: 12:00:00 0.25 mg via INTRAVENOUS

## 2019-05-21 MED ORDER — SODIUM CHLORIDE 0.9% FLUSH
10.0000 mL | INTRAVENOUS | Status: DC | PRN
  Administered 2019-05-21: 10 mL
  Filled 2019-05-21: qty 10

## 2019-05-21 MED ORDER — SODIUM CHLORIDE 0.9 % IV SOLN
30.0000 mg/m2 | Freq: Once | INTRAVENOUS | Status: AC
  Administered 2019-05-21: 59 mg via INTRAVENOUS
  Filled 2019-05-21: qty 59

## 2019-05-21 MED ORDER — SODIUM CHLORIDE 0.9 % IV SOLN
Freq: Once | INTRAVENOUS | Status: AC
  Administered 2019-05-21: 12:00:00 via INTRAVENOUS
  Filled 2019-05-21: qty 5

## 2019-05-21 MED ORDER — IRINOTECAN HCL CHEMO INJECTION 100 MG/5ML
65.0000 mg/m2 | Freq: Once | INTRAVENOUS | Status: AC
  Administered 2019-05-21: 120 mg via INTRAVENOUS
  Filled 2019-05-21: qty 6

## 2019-05-21 MED ORDER — PALONOSETRON HCL INJECTION 0.25 MG/5ML
INTRAVENOUS | Status: AC
  Filled 2019-05-21: qty 5

## 2019-05-21 NOTE — Patient Instructions (Signed)
[facility] Discharge Instructions for Patients Receiving Chemotherapy  Today you received the following chemotherapy agents:  Irinotecan and Cisplatin.  To help prevent nausea and vomiting after your treatment, we encourage you to take your nausea medication as directed.   If you develop nausea and vomiting that is not controlled by your nausea medication, call the clinic.   BELOW ARE SYMPTOMS THAT SHOULD BE REPORTED IMMEDIATELY:  *FEVER GREATER THAN 100.5 F  *CHILLS WITH OR WITHOUT FEVER  NAUSEA AND VOMITING THAT IS NOT CONTROLLED WITH YOUR NAUSEA MEDICATION  *UNUSUAL SHORTNESS OF BREATH  *UNUSUAL BRUISING OR BLEEDING  TENDERNESS IN MOUTH AND THROAT WITH OR WITHOUT PRESENCE OF ULCERS  *URINARY PROBLEMS  *BOWEL PROBLEMS  UNUSUAL RASH Items with * indicate a potential emergency and should be followed up as soon as possible.  Feel free to call the clinic should you have any questions or concerns. The clinic phone number is [phone].  Please show the [place] at check-in to the Emergency Department and triage nurse.

## 2019-05-21 NOTE — Progress Notes (Signed)
Per Dr. Julien Nordmann, [facility] to treat today with platelets 83 and ANC 1.1.

## 2019-05-21 NOTE — Progress Notes (Signed)
Per Dr. Julien Nordmann, increase potassium in cisplatin fluids to 40 mEq for low level of 3.4.   Demetrius Charity, PharmD, [facility] Pharmacist Pharmacy Phone: [phone] 05/21/2019

## 2019-05-28 ENCOUNTER — Ambulatory Visit: Payer: Medicare Other | Admitting: Internal Medicine

## 2019-05-28 ENCOUNTER — Other Ambulatory Visit: Payer: Medicare Other

## 2019-05-28 ENCOUNTER — Ambulatory Visit: Payer: Medicare Other

## 2019-05-29 ENCOUNTER — Ambulatory Visit (HOSPITAL_COMMUNITY)
Admission: RE | Admit: 2019-05-29 | Discharge: 2019-05-29 | Disposition: A | Discharge status: Home / Self Care | Payer: Medicare Other | Source: Ambulatory Visit | Attending: Physician Assistant | Admitting: Physician Assistant

## 2019-05-29 ENCOUNTER — Encounter (HOSPITAL_COMMUNITY): Payer: Self-pay

## 2019-05-29 ENCOUNTER — Other Ambulatory Visit: Payer: Self-pay

## 2019-05-29 DIAGNOSIS — C3492 Malignant neoplasm of unspecified part of left bronchus or lung: Secondary | ICD-10-CM | POA: Diagnosis not present

## 2019-05-29 DIAGNOSIS — K7460 Unspecified cirrhosis of liver: Secondary | ICD-10-CM | POA: Diagnosis not present

## 2019-05-29 MED ORDER — SODIUM CHLORIDE (PF) 0.9 % IJ SOLN
INTRAMUSCULAR | Status: AC
  Filled 2019-05-29: qty 50

## 2019-05-29 MED ORDER — IOHEXOL 300 MG/ML  SOLN
100.0000 mL | Freq: Once | INTRAMUSCULAR | Status: AC | PRN
  Administered 2019-05-29: 100 mL via INTRAVENOUS

## 2019-06-04 ENCOUNTER — Inpatient Hospital Stay: Payer: Medicare Other

## 2019-06-04 ENCOUNTER — Inpatient Hospital Stay (HOSPITAL_BASED_OUTPATIENT_CLINIC_OR_DEPARTMENT_OTHER): Payer: Medicare Other | Admitting: Physician Assistant

## 2019-06-04 ENCOUNTER — Other Ambulatory Visit: Payer: Self-pay

## 2019-06-04 ENCOUNTER — Other Ambulatory Visit: Payer: Self-pay | Admitting: Physician Assistant

## 2019-06-04 ENCOUNTER — Other Ambulatory Visit: Payer: Self-pay | Admitting: *Deleted

## 2019-06-04 ENCOUNTER — Telehealth: Payer: Self-pay | Admitting: *Deleted

## 2019-06-04 ENCOUNTER — Encounter: Payer: Self-pay | Admitting: Physician Assistant

## 2019-06-04 VITALS — BP 126/61 | HR 90 | Temp 98.9°F | Resp 18 | Ht 65.0 in | Wt 183.2 lb

## 2019-06-04 DIAGNOSIS — E559 Vitamin D deficiency, unspecified: Secondary | ICD-10-CM | POA: Diagnosis not present

## 2019-06-04 DIAGNOSIS — E871 Hypo-osmolality and hyponatremia: Secondary | ICD-10-CM | POA: Diagnosis not present

## 2019-06-04 DIAGNOSIS — D696 Thrombocytopenia, unspecified: Secondary | ICD-10-CM

## 2019-06-04 DIAGNOSIS — C7951 Secondary malignant neoplasm of bone: Secondary | ICD-10-CM

## 2019-06-04 DIAGNOSIS — D61818 Other pancytopenia: Secondary | ICD-10-CM

## 2019-06-04 DIAGNOSIS — E876 Hypokalemia: Secondary | ICD-10-CM

## 2019-06-04 DIAGNOSIS — Z95828 Presence of other vascular implants and grafts: Secondary | ICD-10-CM

## 2019-06-04 DIAGNOSIS — E785 Hyperlipidemia, unspecified: Secondary | ICD-10-CM | POA: Diagnosis not present

## 2019-06-04 DIAGNOSIS — Z79899 Other long term (current) drug therapy: Secondary | ICD-10-CM

## 2019-06-04 DIAGNOSIS — J449 Chronic obstructive pulmonary disease, unspecified: Secondary | ICD-10-CM

## 2019-06-04 DIAGNOSIS — C3492 Malignant neoplasm of unspecified part of left bronchus or lung: Secondary | ICD-10-CM

## 2019-06-04 DIAGNOSIS — C787 Secondary malignant neoplasm of liver and intrahepatic bile duct: Secondary | ICD-10-CM | POA: Diagnosis not present

## 2019-06-04 DIAGNOSIS — Z5111 Encounter for antineoplastic chemotherapy: Secondary | ICD-10-CM | POA: Diagnosis not present

## 2019-06-04 DIAGNOSIS — Z8541 Personal history of malignant neoplasm of cervix uteri: Secondary | ICD-10-CM

## 2019-06-04 DIAGNOSIS — D61810 Antineoplastic chemotherapy induced pancytopenia: Secondary | ICD-10-CM

## 2019-06-04 DIAGNOSIS — Z7951 Long term (current) use of inhaled steroids: Secondary | ICD-10-CM | POA: Diagnosis not present

## 2019-06-04 LAB — CBC WITH DIFFERENTIAL (CANCER CENTER ONLY)
Abs Immature Granulocytes: 0.01 10*3/uL (ref 0.00–0.07)
Basophils Absolute: 0 10*3/uL (ref 0.0–0.1)
Basophils Relative: 1 %
Eosinophils Absolute: 0.1 10*3/uL (ref 0.0–0.5)
Eosinophils Relative: 3 %
HCT: 23.7 % — ABNORMAL LOW (ref 36.0–46.0)
Hemoglobin: 8.4 g/dL — ABNORMAL LOW (ref 12.0–15.0)
Immature Granulocytes: 1 %
Lymphocytes Relative: 33 %
Lymphs Abs: 0.6 10*3/uL — ABNORMAL LOW (ref 0.7–4.0)
MCH: 34.3 pg — ABNORMAL HIGH (ref 26.0–34.0)
MCHC: 35.4 g/dL (ref 30.0–36.0)
MCV: 96.7 fL (ref 80.0–100.0)
Monocytes Absolute: 0.4 10*3/uL (ref 0.1–1.0)
Monocytes Relative: 21 %
Neutro Abs: 0.7 10*3/uL — ABNORMAL LOW (ref 1.7–7.7)
Neutrophils Relative %: 41 %
Platelet Count: 55 10*3/uL — ABNORMAL LOW (ref 150–400)
RBC: 2.45 MIL/uL — ABNORMAL LOW (ref 3.87–5.11)
RDW: 19.9 % — ABNORMAL HIGH (ref 11.5–15.5)
WBC Count: 1.7 10*3/uL — ABNORMAL LOW (ref 4.0–10.5)
nRBC: 0 % (ref 0.0–0.2)

## 2019-06-04 LAB — CMP (CANCER CENTER ONLY)
ALT: 22 U/L (ref 0–44)
AST: 30 U/L (ref 15–41)
Albumin: 3.7 g/dL (ref 3.5–5.0)
Alkaline Phosphatase: 172 U/L — ABNORMAL HIGH (ref 38–126)
Anion gap: 10 (ref 5–15)
BUN: 4 mg/dL — ABNORMAL LOW (ref 8–23)
CO2: 27 mmol/L (ref 22–32)
Calcium: 8.5 mg/dL — ABNORMAL LOW (ref 8.9–10.3)
Chloride: 94 mmol/L — ABNORMAL LOW (ref 98–111)
Creatinine: 0.64 mg/dL (ref 0.44–1.00)
GFR, Est AFR Am: >60 mL/min (ref >60)
GFR, Estimated: >60 mL/min (ref >60)
Glucose, Bld: 105 mg/dL — ABNORMAL HIGH (ref 70–99)
Potassium: 3 mmol/L — CL (ref 3.5–5.1)
Sodium: 131 mmol/L — ABNORMAL LOW (ref 135–145)
Total Bilirubin: 0.3 mg/dL (ref 0.3–1.2)
Total Protein: 7 g/dL (ref 6.5–8.1)

## 2019-06-04 LAB — MAGNESIUM: Magnesium: 1.5 mg/dL — ABNORMAL LOW (ref 1.7–2.4)

## 2019-06-04 MED ORDER — SODIUM CHLORIDE 0.9 % IV SOLN
Freq: Once | INTRAVENOUS | Status: AC
  Administered 2019-06-04: 10:00:00 via INTRAVENOUS
  Filled 2019-06-04: qty 1000

## 2019-06-04 MED ORDER — HEPARIN SOD (PORK) LOCK FLUSH 100 UNIT/ML IV SOLN
500.0000 [IU] | Freq: Once | INTRAVENOUS | Status: AC | PRN
  Administered 2019-06-04: 500 [IU]
  Filled 2019-06-04: qty 5

## 2019-06-04 MED ORDER — POTASSIUM CHLORIDE CRYS ER 20 MEQ PO TBCR: 20.0000 meq | EXTENDED_RELEASE_TABLET | Freq: Two times a day (BID) | ORAL | 0 refills | Status: DC

## 2019-06-04 MED ORDER — SODIUM CHLORIDE 0.9 % IV SOLN
Freq: Once | INTRAVENOUS | Status: AC
  Administered 2019-06-04: 10:00:00 via INTRAVENOUS
  Filled 2019-06-04: qty 250

## 2019-06-04 MED ORDER — SODIUM CHLORIDE 0.9% FLUSH
10.0000 mL | INTRAVENOUS | Status: DC | PRN
  Administered 2019-06-04: 10 mL
  Filled 2019-06-04: qty 10

## 2019-06-04 NOTE — Telephone Encounter (Signed)
Received call report from [facility].  "Today's K+ = 3.0 mmol/L."  Called provider with results.  Provider inquired about Mg+ level; noted resulted Mg. = 1.5 mg/dl.  S/P provider F/U today currently infusion area.

## 2019-06-04 NOTE — Progress Notes (Signed)
[place] OFFICE PROGRESS NOTE  [name], MD No address on file  DIAGNOSIS: Extensive stage (T3, N2, M1a)small cell lung cancer diagnosed in August 2019 and presented with large left hilar/subhilar mass in addition to mediastinal lymphadenopathy and contracture and mass in the right upper lobe.  PRIOR THERAPY: 1)Systemic therapy with carboplatin for AUC of 5 on day 1, etoposide 100 mg/M2 on days 1, 2 and 3 as well as Tecentriq (Atezolizumab) 1200 mg IV every 3 weeks with Neulasta support. Status post 10 cycles. Starting from cycle #7, the patient will be treated with only single agent immunotherapy with Tecentriq 1200 mg IV every 3 weeks. Last dose was given January 27, 2019 discontinued secondary to disease progression. 2)palliative radiotherapy to the mediastinal lymph nodes.  CURRENT THERAPY: Systemic chemotherapy with cisplatin 30 mg/M2 and irinotecan 65 mg/M2 every 3 weeks. First dose Apr 16, 2019.Status post 2 cycles. Her dose will be reduced to Cisplatin 25 mg/m2 and irinotecan 50 mg/m2 starting cycle #3.   INTERVAL HISTORY: [name] 63 y.o. female returns to the clinic for a follow-up visit.  The patient is feeling well today without any concerning complaints.  The patient frequently experiences neutropenia and thrombocytopenia requiring dose delays in her treatment.  She also has received Granix on neutropenia after her last visit.  She also frequently requires IV replenishment of magnesium and potassium.  She denies any fever, chills, night sweats, or weight loss.  She denies any chest pain, shortness of breath, cough, or hemoptysis.  She denies any nausea, vomiting, diarrhea, or constipation.  She denies any headache or visual changes.  She denies any rashes or skin changes.  The patient recently had a restaging CT scan performed.  She is here today for evaluation before starting cycle #3.  MEDICAL HISTORY: Past Medical History:  Diagnosis Date  .  Alpha-1-antitrypsin deficiency ([place])   . Cervical cancer ([place]) 2004  . 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 This doesn't fit entirely with emphysema.      . Exposure to hepatitis C 09/12/2016  . Fibromyalgia   . GERD (gastroesophageal reflux disease)   . Hyperlipidemia   . IBS (irritable bowel syndrome)   . Obstructive sleep apnea 06/02/2008   NPSG 06/07/08, AHI 6.9/ hr, weight 220 lbs    . Osteoporosis   . Pulmonary fibrosis, unspecified ([place]) 09/29/2016   CXR 03/28/2016  . Rhinitis, nonallergic 05/08/2012  . 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 Vitamin D deficiency     ALLERGIES:  is allergic to penicillins; actonel [risedronate sodium]; advair diskus [fluticasone-salmeterol]; alendronate sodium; aspirin; azithromycin; keflex [cephalexin]; levofloxacin; morphine; nsaids; omnicef [cefdinir]; and erythromycin.  MEDICATIONS:  Current Outpatient Medications  Medication Sig Dispense Refill  . albuterol (PROVENTIL) (2.5 MG/3ML) 0.083% nebulizer solution Take 3 mLs (2.5 mg total) by nebulization every 6 (six) hours as needed. 90 mL 4  . allopurinol (ZYLOPRIM) 300 MG tablet Take 1 tablet (300 mg total) by mouth daily. Begin on 07/25/2018 14 tablet 0  . amitriptyline (ELAVIL) 75 MG tablet Take 75 mg by mouth at bedtime.      Marland Kitchen demeclocycline (DECLOMYCIN) 150 MG tablet TAKE  (1)  TABLET TWICE A DAY. 60 tablet 0  . ezetimibe (ZETIA) 10 MG tablet TAKE 1 TABLET ONCE A DAY 90 tablet 0  .  feeding supplement, ENSURE ENLIVE, (ENSURE ENLIVE) LIQD Take 237 mLs by mouth 2 (two) times daily between meals. 60 Bottle 0  . fluticasone (FLONASE) 50 MCG/ACT nasal spray USE 2 SPRAYS IN EACH NOSTRIL ONCE DAILY. 16 g 5  . folic acid (FOLVITE) 1 MG tablet Take 1 mg by mouth daily.    .  Glycopyrrolate-Formoterol (BEVESPI AEROSPHERE) 9-4.8 MCG/ACT AERO Inhale 2 puffs into the lungs 2 (two) times daily. 1 Inhaler 12  . HYDROcodone-acetaminophen (NORCO/VICODIN) 5-325 MG tablet Take 1 tablet by mouth 3 (three) times daily. 30 tablet 0  . levothyroxine (SYNTHROID) 75 MCG tablet Take 1 tablet (75 mcg total) by mouth daily before breakfast. 30 tablet 1  . lidocaine-prilocaine (EMLA) cream Apply 1 application topically as needed. 30 g 0  . Nebulizers (COMPRESSOR NEBULIZER) MISC 1 Device by Does not apply route as needed. 1 each prn  . omeprazole (PRILOSEC) 40 MG capsule TAKE (1) CAPSULE DAILY 90 capsule 0  . potassium chloride SA (K-DUR) 20 MEQ tablet Take 1 tablet (20 mEq total) by mouth 2 (two) times daily. 14 tablet 0  . PROAIR HFA 108 (90 Base) MCG/ACT inhaler 2 PUFFS EVERY 4 HOURS AS NEEDED FOR WHEEZING 8.5 g 0  . prochlorperazine (COMPAZINE) 10 MG tablet Take 1 tablet (10 mg total) by mouth every 6 (six) hours as needed for nausea or vomiting. 30 tablet 1  . rosuvastatin (CRESTOR) 40 MG tablet Take 0.5 tablets (20 mg total) by mouth daily. 45 tablet 3   Current Facility-Administered Medications  Medication Dose Route Frequency Provider Last Rate Last Dose  . sodium chloride 0.9 % 1,000 mL with potassium chloride 40 mEq, magnesium sulfate 2 g infusion   Intravenous Once Luciana Cammarata L, PA-C        SURGICAL HISTORY:  Past Surgical History:  Procedure Laterality Date  . ABDOMINAL HYSTERECTOMY    . IR IMAGING GUIDED PORT INSERTION  08/02/2018  . LUMBAR DISC SURGERY    . VIDEO BRONCHOSCOPY Bilateral 07/10/2018   Procedure: VIDEO BRONCHOSCOPY WITHOUT FLUORO;  Surgeon: Laurin Coder, MD;  Location: [facility];  Service: Cardiopulmonary;  Laterality: Bilateral;    REVIEW OF SYSTEMS:   Review of Systems  Constitutional: Negative for appetite change, chills, fatigue, fever and unexpected weight change.  HENT:   Negative for mouth sores, nosebleeds, sore throat and  trouble swallowing.   Eyes: Negative for eye problems and icterus.  Respiratory: Negative for cough, hemoptysis, shortness of breath and wheezing.   Cardiovascular: Negative for chest pain and leg swelling.  Gastrointestinal: Negative for abdominal pain, constipation, diarrhea, nausea and vomiting.  Genitourinary: Negative for bladder incontinence, difficulty urinating, dysuria, frequency and hematuria.   Musculoskeletal: Negative for back pain, gait problem, neck pain and neck stiffness.  Skin: Negative for itching and rash.  Neurological: Negative for dizziness, extremity weakness, gait problem, headaches, light-headedness and seizures.  Hematological: Negative for adenopathy. Does not bruise/bleed easily.  Psychiatric/Behavioral: Negative for confusion, depression and sleep disturbance. The patient is not nervous/anxious.     PHYSICAL EXAMINATION:  Blood pressure 126/61, pulse 90, temperature 98.9 F (37.2 C), resp. rate 18, height 5\' 5"  (1.651 m), weight 183 lb 3.2 oz (83.1 kg), SpO2 94 %.  ECOG PERFORMANCE STATUS: 1 - Symptomatic but completely ambulatory  Physical Exam  Constitutional: Oriented to person, place, and time and well-developed, well-nourished, and in no distress.   HENT:  Head: Normocephalic and atraumatic.  Mouth/Throat: Oropharynx is clear and moist. No oropharyngeal exudate.  Eyes: Conjunctivae are normal.  Right eye exhibits no discharge. Left eye exhibits no discharge. No scleral icterus.  Neck: Normal range of motion. Neck supple.  Cardiovascular: Normal rate, regular rhythm, normal heart sounds and intact distal pulses.   Pulmonary/Chest: Crackles in left and right lung in all lung fields. Effort normal and breath sounds normal. No respiratory distress. No wheezes.   Abdominal: Soft. Bowel sounds are normal. Exhibits no distension and no mass. There is no tenderness.  Musculoskeletal: Normal range of motion. Exhibits no edema.  Lymphadenopathy:    No cervical  adenopathy.  Neurological: Alert and oriented to person, place, and time. Exhibits normal muscle tone. Gait normal. Coordination normal.  Skin: Skin is warm and dry. No rash noted. Not diaphoretic. No erythema. No pallor.  Psychiatric: Mood, memory and judgment normal.  Vitals reviewed.  LABORATORY DATA: Lab Results  Component Value Date   WBC 1.7 (L) 06/04/2019   HGB 8.4 (L) 06/04/2019   HCT 23.7 (L) 06/04/2019   MCV 96.7 06/04/2019   PLT 55 (L) 06/04/2019      Chemistry      Component Value Date/Time   NA 131 (L) 06/04/2019 0833   NA 124 (L) 07/16/2018 0853   K 3.0 (LL) 06/04/2019 0833   CL 94 (L) 06/04/2019 0833   CO2 27 06/04/2019 0833   BUN 4 (L) 06/04/2019 0833   BUN 8 07/16/2018 0853   CREATININE 0.64 06/04/2019 0833   CREATININE 0.68 02/13/2013 1523      Component Value Date/Time   CALCIUM 8.5 (L) 06/04/2019 0833   ALKPHOS 172 (H) 06/04/2019 0833   AST 30 06/04/2019 0833   ALT 22 06/04/2019 0833   BILITOT 0.3 06/04/2019 0833       RADIOGRAPHIC STUDIES:  Ct Chest W Contrast  Result Date: 05/29/2019 CLINICAL DATA:  63 year old female for restaging of metastatic small cell LEFT lung cancer. Patient is currently on chemotherapy. Completed immunotherapy and radiation therapy. EXAM: CT CHEST, ABDOMEN, AND PELVIS WITH CONTRAST TECHNIQUE: Multidetector CT imaging of the chest, abdomen and pelvis was performed following the standard protocol during bolus administration of intravenous contrast. CONTRAST:  178mL OMNIPAQUE IOHEXOL 300 MG/ML  SOLN COMPARISON:  04/04/2019 and prior CTs FINDINGS: CT CHEST FINDINGS Cardiovascular: Heart size is normal. Coronary artery and aortic atherosclerotic calcifications again identified without thoracic aortic aneurysm or pericardial effusion. A RIGHT Port-A-Cath is again identified with tip in the RIGHT atrium. Mediastinum/Nodes: No enlarged lymph nodes are identified. No mediastinal mass noted. The visualized esophagus is unremarkable.  Lungs/Pleura: The central LEFT hilar/lung mass has decreased in size with measurable soft tissue now 1 x 1 cm (series 2: Image 29), previously 3.5 x 2 cm on 04/04/2019. No new pulmonary mass or nodules are identified. No airspace disease, consolidation, pleural effusion or pneumothorax. Emphysema again noted. Minimal subsegmental atelectasis/scarring in the lung bases again identified. Musculoskeletal: No acute or suspicious bony abnormalities are noted. CT ABDOMEN PELVIS FINDINGS Hepatobiliary: Morphologic changes of cirrhosis again noted. Scattered ill-defined hypodense lesions within the liver do not appear significantly changed with index lesions as follows: A 1.3 cm LEFT LATERAL hepatic lobe lesion (2:59) A 1.3 cm central RIGHT hepatic lesion (2:50). No new hepatic lesions are noted. The gallbladder is unremarkable.  No biliary dilatation. Pancreas: Unremarkable Spleen: Unremarkable Adrenals/Urinary Tract: The kidneys, adrenal glands and bladder are unremarkable. Stomach/Bowel: Stomach is within normal limits. Appendix appears normal. No evidence of bowel wall thickening, distention, or inflammatory changes. Vascular/Lymphatic: UPPER abdominal, retroperitoneal and periportal lymph nodes are smaller since the prior  study, now normal in size. An index periportal node now measures 1.2 cm in short axis (2:63), previously 2 cm. No new or enlarging lymph nodes are identified. Aortic atherosclerotic calcifications noted without aneurysm. Reproductive: Status post hysterectomy. No adnexal masses. Other: No ascites, focal collection or pneumoperitoneum. Musculoskeletal: No acute or suspicious bony abnormalities. 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 IMPRESSION: 1. Treatment response with decreased size of LEFT hilar/perihilar mass and enlarged UPPER abdominal/retroperitoneal lymph nodes. 2. Multiple hepatic lesions without significant change. 3. Cirrhosis 4. Coronary artery disease 5. Aortic  Atherosclerosis (ICD10-I70.0) and Emphysema (ICD10-J43.9). Electronically Signed   By: Margarette Canada M.D.   On: 05/29/2019 09:15   Ct Abdomen Pelvis W Contrast  Result Date: 05/29/2019 CLINICAL DATA:  63 year old female for restaging of metastatic small cell LEFT lung cancer. Patient is currently on chemotherapy. Completed immunotherapy and radiation therapy. EXAM: CT CHEST, ABDOMEN, AND PELVIS WITH CONTRAST TECHNIQUE: Multidetector CT imaging of the chest, abdomen and pelvis was performed following the standard protocol during bolus administration of intravenous contrast. CONTRAST:  182mL OMNIPAQUE IOHEXOL 300 MG/ML  SOLN COMPARISON:  04/04/2019 and prior CTs FINDINGS: CT CHEST FINDINGS Cardiovascular: Heart size is normal. Coronary artery and aortic atherosclerotic calcifications again identified without thoracic aortic aneurysm or pericardial effusion. A RIGHT Port-A-Cath is again identified with tip in the RIGHT atrium. Mediastinum/Nodes: No enlarged lymph nodes are identified. No mediastinal mass noted. The visualized esophagus is unremarkable. Lungs/Pleura: The central LEFT hilar/lung mass has decreased in size with measurable soft tissue now 1 x 1 cm (series 2: Image 29), previously 3.5 x 2 cm on 04/04/2019. No new pulmonary mass or nodules are identified. No airspace disease, consolidation, pleural effusion or pneumothorax. Emphysema again noted. Minimal subsegmental atelectasis/scarring in the lung bases again identified. Musculoskeletal: No acute or suspicious bony abnormalities are noted. CT ABDOMEN PELVIS FINDINGS Hepatobiliary: Morphologic changes of cirrhosis again noted. Scattered ill-defined hypodense lesions within the liver do not appear significantly changed with index lesions as follows: A 1.3 cm LEFT LATERAL hepatic lobe lesion (2:59) A 1.3 cm central RIGHT hepatic lesion (2:50). No new hepatic lesions are noted. The gallbladder is unremarkable.  No biliary dilatation. Pancreas: Unremarkable  Spleen: Unremarkable Adrenals/Urinary Tract: The kidneys, adrenal glands and bladder are unremarkable. Stomach/Bowel: Stomach is within normal limits. Appendix appears normal. No evidence of bowel wall thickening, distention, or inflammatory changes. Vascular/Lymphatic: UPPER abdominal, retroperitoneal and periportal lymph nodes are smaller since the prior study, now normal in size. An index periportal node now measures 1.2 cm in short axis (2:63), previously 2 cm. No new or enlarging lymph nodes are identified. Aortic atherosclerotic calcifications noted without aneurysm. Reproductive: Status post hysterectomy. No adnexal masses. Other: No ascites, focal collection or pneumoperitoneum. Musculoskeletal: No acute or suspicious bony abnormalities. 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 IMPRESSION: 1. Treatment response with decreased size of LEFT hilar/perihilar mass and enlarged UPPER abdominal/retroperitoneal lymph nodes. 2. Multiple hepatic lesions without significant change. 3. Cirrhosis 4. Coronary artery disease 5. Aortic Atherosclerosis (ICD10-I70.0) and Emphysema (ICD10-J43.9). Electronically Signed   By: Margarette Canada M.D.   On: 05/29/2019 09:15     ASSESSMENT/PLAN:  This is a very pleasant 63 year old Caucasian female diagnosed with extensive stage small cell lung cancer. She presented with a large left perihilar mass as well as mediastinal lymphadenopathy as well as metastatic disease to the liver and bone. She wasdiagnosed in August 2019.  The patient was previously treated with systemic chemotherapy with carboplatin, etoposide,  and Tecentriq. She is status post 6 cycles. She then was treated withsingle agent maintenance Tecentriq for 4 cycles. This was discontinued due to evidence of disease progression.  She underwent palliative radiotherapy to the progressive disease involving the lung and mediastinum.  She is currently undergoing treatment with cisplatin at a  reduced dose of 30 mg/m andirinotecan65 mg/m on days 1 and 8 every 3 weeks. She is status post 2 cycles. She tolerated it well except for fatigue, electrolyte disturbances, and pancytopenia requiring granix and dose delays. Her dose will be reduced to 25 mg/m2 of Cisplatin and 50 mg/m2 of irinotecan starting from cycle #3.  The patient recently had a restaging CT scan performed.  Dr. Julien Nordmann personally and independently reviewed the scan and discussed the results today the scan showed a positive response to treatment.  Dr. Julien Nordmann recommends the patient proceed with the same treatment. However, her platelets are 55,000 today and she is neutropenic. We will delay her treatment by 1 week. We will reassess/ review her labs next week before proceeding with cycle #3.   I will arrange for the patient to receive 40 meq of potassium chloride today and 2 g of magnesium sulfate IV. I have sent a prescription for oral potassium to her pharmacy as well 20 meq BID for 7 days. We will recheck her labs next week when she returns to the clinic.   I will see her back for follow-up visit 1 week for evaluation for starting cycle 3.  The patient was advised to call immediately if she has any concerning symptoms in the interval. The patient voices understanding of current disease status and treatment options and is in agreement with the current care plan. All questions were answered. The patient knows to call the clinic with any problems, questions or concerns. We can certainly see the patient much sooner if necessary  No orders of the defined types were placed in this encounter.    Jessica Szuch L Dejaun Vidrio, PA-C 06/04/19  ADDENDUM: Hematology/Oncology Attending: I had a face-to-face encounter with the patient today.  I recommended her care plan.  This is a very pleasant 63 years old white female with metastatic small cell lung cancer and currently undergoing second line treatment with cisplatin and irinotecan  on days 1 and 8 status post 2 cycles.  She has been tolerating the treatment well with no concerning adverse effect but she had pancytopenia after her treatment. Her platelets count are low today and does not meet the parameter for treatment. The patient had repeat CT scan of the chest, abdomen pelvis performed recently.  I personally and independently reviewed the scan images and discussed the results with the patient today. Her scan showed improvement of her disease. I recommended for the patient to continue her current treatment but I will reduce the dose of cisplatin to 25 mg/M2 and irinotecan to 50 mg/M2 on days 1 and 8 every 3 weeks.  She will start cycle #3 next week after improvement of her platelets count. For the electrolyte imbalance will arrange for the patient to receive potassium chloride and magnesium sulfate supplementation intravenously in the clinic today.  She will also have a prescription for potassium chloride to be used daily for the next 7 days. She will come back for follow-up visit in 1 week for evaluation before starting cycle #3. The patient was advised to call immediately if she has any concerning symptoms in the interval.  Disclaimer: This note was dictated with voice recognition software. Similar sounding  words can inadvertently be transcribed and may be missed upon review. Eilleen Kempf, MD 06/04/19

## 2019-06-04 NOTE — Patient Instructions (Signed)
Hypomagnesemia Hypomagnesemia is a condition in which the level of magnesium in the blood is low. Magnesium is a mineral that is found in many foods. It is used in many different processes in the body. Hypomagnesemia can affect every organ in the body. In severe cases, it can cause life-threatening problems. What are the causes? This condition may be caused by:  Not getting enough magnesium in your diet.  Malnutrition.  Problems with absorbing magnesium from the intestines.  Dehydration.  Alcohol abuse.  Vomiting.  Severe or chronic diarrhea.  Some medicines, including medicines that make you urinate more (diuretics).  Certain diseases, such as kidney disease, diabetes, celiac disease, and overactive thyroid. What are the signs or symptoms? Symptoms of this condition include:  Loss of appetite.  Nausea and vomiting.  Involuntary shaking or trembling of a body part (tremor).  Muscle weakness.  Tingling in the arms and legs.  Sudden tightening of muscles (muscle spasms).  Confusion.  Psychiatric issues, such as depression, irritability, or psychosis.  A feeling of fluttering of the heart.  Seizures. These symptoms are more severe if magnesium levels drop suddenly. How is this diagnosed? This condition may be diagnosed based on:  Your symptoms and medical history.  A physical exam.  Blood and urine tests. How is this treated? Treatment depends on the cause and the severity of the condition. It may be treated with:  A magnesium supplement. This can be taken in pill form. If the condition is severe, magnesium is usually given through an IV.  Changes to your diet. You may be directed to eat foods that have a lot of magnesium, such as green leafy vegetables, peas, beans, and nuts.  Stopping any intake of alcohol. Follow these instructions at home:      Make sure that your diet includes foods with magnesium. Foods that have a lot of magnesium in them include:  ? Green leafy vegetables, such as spinach and broccoli. ? Beans and peas. ? Nuts and seeds, such as almonds and sunflower seeds. ? Whole grains, such as whole grain bread and fortified cereals.  Take magnesium supplements if your health care provider tells you to do that. Take them as directed.  Take over-the-counter and prescription medicines only as told by your health care provider.  Have your magnesium levels monitored as told by your health care provider.  When you are active, drink fluids that contain electrolytes.  Avoid drinking alcohol.  Keep all follow-up visits as told by your health care provider. This is important. Contact a health care provider if:  You get worse instead of better.  Your symptoms return. Get help right away if you:  Develop severe muscle weakness.  Have trouble breathing.  Feel that your heart is racing. Summary  Hypomagnesemia is a condition in which the level of magnesium in the blood is low.  Hypomagnesemia can affect every organ in the body.  Treatment may include eating more foods that contain magnesium, taking magnesium supplements, and not drinking alcohol.  Have your magnesium levels monitored as told by your health care provider. This information is not intended to replace advice given to you by your health care provider. Make sure you discuss any questions you have with your health care provider. Document Released: 08/02/2005 Document Revised: 10/08/2017 Document Reviewed: 10/08/2017 Elsevier Interactive Patient Education  2019 [employer]. Hypokalemia Hypokalemia means that the amount of potassium in the blood is lower than normal.Potassium is a chemical that helps regulate the amount of fluid in  the body (electrolyte). It also stimulates muscle tightening (contraction) and helps nerves work properly.Normally, most of the body's potassium is inside of cells, and only a very small amount is in the blood. Because the amount in the  blood is so small, minor changes to potassium levels in the blood can be life-threatening. What are the causes? This condition may be caused by:  Antibiotic medicine.  Diarrhea or vomiting. Taking too much of a medicine that helps you have a bowel movement (laxative) can cause diarrhea and lead to hypokalemia.  Chronic kidney disease (CKD).  Medicines that help the body get rid of excess fluid (diuretics).  Eating disorders, such as bulimia.  Low magnesium levels in the body.  Sweating a lot. What are the signs or symptoms? Symptoms of this condition include:  Weakness.  Constipation.  Fatigue.  Muscle cramps.  Mental confusion.  Skipped heartbeats or irregular heartbeat (palpitations).  Tingling or numbness. How is this diagnosed? This condition is diagnosed with a blood test. How is this treated? Hypokalemia can be treated by taking potassium supplements by mouth or adjusting the medicines that you take. Treatment may also include eating more foods that contain a lot of potassium. If your potassium level is very low, you may need to get potassium through an IV tube in one of your veins and be monitored in the hospital. Follow these instructions at home:   Take over-the-counter and prescription medicines only as told by your health care provider. This includes vitamins and supplements.  Eat a healthy diet. A healthy diet includes fresh fruits and vegetables, whole grains, healthy fats, and lean proteins.  If instructed, eat more foods that contain a lot of potassium, such as: ? Nuts, such as peanuts and pistachios. ? Seeds, such as sunflower seeds and pumpkin seeds. ? Peas, lentils, and lima beans. ? Whole grain and bran cereals and breads. ? Fresh fruits and vegetables, such as apricots, avocado, bananas, cantaloupe, kiwi, oranges, tomatoes, asparagus, and potatoes. ? Orange juice. ? Tomato juice. ? Red meats. ? Yogurt.  Keep all follow-up visits as told by  your health care provider. This is important. Contact a health care provider if:  You have weakness that gets worse.  You feel your heart pounding or racing.  You vomit.  You have diarrhea.  You have diabetes (diabetes mellitus) and you have trouble keeping your blood sugar (glucose) in your target range. Get help right away if:  You have chest pain.  You have shortness of breath.  You have vomiting or diarrhea that lasts for more than 2 days.  You faint. This information is not intended to replace advice given to you by your health care provider. Make sure you discuss any questions you have with your health care provider. Document Released: 11/06/2005 Document Revised: 06/24/2016 Document Reviewed: 06/24/2016 Elsevier Interactive Patient Education  2019 [employer].   Thrombocytopenia Thrombocytopenia is a condition in which you have a low number of platelets in your blood. Platelets are also called thrombocytes. Platelets are tiny cells in the blood. When you bleed, they clump together at the cut or injury to stop the bleeding. This is called blood clotting. Not having enough platelets can cause bleeding problems. Some cases of thrombocytopenia are mild while others are more severe. What are the causes? This condition may be caused by:  Decreased production of platelets. This may be caused by: ? Aplastic anemia. This is when your bone marrow stops making blood cells. ? Cancer in the bone  marrow. ? Certain medicines, including chemotherapy. ? Infection in the bone marrow. ? Drinking a lot of alcohol.  Increased destruction of platelets. This may be caused by: ? Certain immune diseases. ? Certain medicines. ? Certain blood clotting disorders. ? Certain inherited disorders. ? Certain bleeding disorders. ? Pregnancy. ? Having an enlarged spleen (hypersplenism). In hypersplenism, the spleen gathers up platelets from circulation. This means that the platelets are not  available to help with blood clotting. The spleen can be enlarged because of cirrhosis or other conditions. What are the signs or symptoms? Symptoms of this condition are the result of poor blood clotting. They will vary depending on how low the platelet counts are. Symptoms may include:  Abnormal bleeding.  Nosebleeds.  Heavy menstrual periods.  Blood in the urine or stool (feces).  A purplish discoloration in the skin (purpura).  Bruising.  A rash that looks like pinpoint, purplish-red spots (petechiae) on the skin and mucous membranes. How is this diagnosed?  This condition may be diagnosed with blood tests and a physical exam. Sometimes, a sample of bone marrow may be removed to look for the original cells (megakaryocytes) that make platelets. Other tests may be needed depending on the cause. How is this treated? Treatment for this condition depends on the cause. Treatment options may include:  Treatment of another condition that is causing the low platelet count.  Medicines to help protect your platelets from being destroyed.  A replacement (transfusion) of platelets to stop or prevent bleeding.  Surgery to remove the spleen. Follow these instructions at home: Activity  Avoid activities that could cause injury or bruising, and follow instructions about how to prevent falls.  Take extra care not to cut yourself when you shave or when you use scissors, needles, knives, and other tools.  Take extra care to protect yourself from burns when ironing or cooking. General instructions   Check your skin and the inside of your mouth for bruising or bleeding as told by your health care provider.  Check your spit (sputum), urine, and stool for blood as told by your health care provider.  Do not drink alcohol.  Take over-the-counter and prescription medicines only as told by your health care provider.  Do not take any medicines that have aspirin or NSAIDs in them. These  medicines can thin your blood and cause you to bleed more easily.  Tell all your health care providers, including dentists and eye doctors, about your condition. Contact a health care provider if you have:  Unexplained bruising. Get help right away if you have:  Active bleeding from anywhere on your body.  Blood in your sputum, urine, or stool. Summary  Thrombocytopenia is a condition in which you have a low number of platelets in your blood.  Platelets are needed for blood clotting.  Symptoms of this condition are the result of poor blood clotting and may include abnormal bleeding, nosebleeds, and bruising.  This condition may be diagnosed with blood tests and a physical exam.  Treatment for this condition depends on the cause. This information is not intended to replace advice given to you by your health care provider. Make sure you discuss any questions you have with your health care provider. Document Released: 11/06/2005 Document Revised: 08/08/2018 Document Reviewed: 08/08/2018 Elsevier Patient Education  2020 [employer].

## 2019-06-06 ENCOUNTER — Other Ambulatory Visit: Payer: Self-pay | Admitting: Internal Medicine

## 2019-06-11 ENCOUNTER — Other Ambulatory Visit: Payer: Self-pay

## 2019-06-11 ENCOUNTER — Telehealth: Payer: Self-pay | Admitting: *Deleted

## 2019-06-11 ENCOUNTER — Inpatient Hospital Stay: Payer: Medicare Other

## 2019-06-11 ENCOUNTER — Encounter: Payer: Self-pay | Admitting: Physician Assistant

## 2019-06-11 ENCOUNTER — Inpatient Hospital Stay (HOSPITAL_BASED_OUTPATIENT_CLINIC_OR_DEPARTMENT_OTHER): Payer: Medicare Other | Admitting: Physician Assistant

## 2019-06-11 ENCOUNTER — Telehealth: Payer: Self-pay | Admitting: Physician Assistant

## 2019-06-11 VITALS — BP 136/81 | HR 89 | Temp 98.0°F | Resp 20 | Wt 182.2 lb

## 2019-06-11 DIAGNOSIS — C7951 Secondary malignant neoplasm of bone: Secondary | ICD-10-CM | POA: Diagnosis not present

## 2019-06-11 DIAGNOSIS — Z95828 Presence of other vascular implants and grafts: Secondary | ICD-10-CM

## 2019-06-11 DIAGNOSIS — D61818 Other pancytopenia: Secondary | ICD-10-CM

## 2019-06-11 DIAGNOSIS — Z7951 Long term (current) use of inhaled steroids: Secondary | ICD-10-CM

## 2019-06-11 DIAGNOSIS — E871 Hypo-osmolality and hyponatremia: Secondary | ICD-10-CM

## 2019-06-11 DIAGNOSIS — C3492 Malignant neoplasm of unspecified part of left bronchus or lung: Secondary | ICD-10-CM | POA: Diagnosis not present

## 2019-06-11 DIAGNOSIS — C787 Secondary malignant neoplasm of liver and intrahepatic bile duct: Secondary | ICD-10-CM | POA: Diagnosis not present

## 2019-06-11 DIAGNOSIS — Z79899 Other long term (current) drug therapy: Secondary | ICD-10-CM

## 2019-06-11 DIAGNOSIS — E785 Hyperlipidemia, unspecified: Secondary | ICD-10-CM | POA: Diagnosis not present

## 2019-06-11 DIAGNOSIS — E876 Hypokalemia: Secondary | ICD-10-CM | POA: Diagnosis not present

## 2019-06-11 DIAGNOSIS — Z8541 Personal history of malignant neoplasm of cervix uteri: Secondary | ICD-10-CM

## 2019-06-11 DIAGNOSIS — E559 Vitamin D deficiency, unspecified: Secondary | ICD-10-CM

## 2019-06-11 DIAGNOSIS — J449 Chronic obstructive pulmonary disease, unspecified: Secondary | ICD-10-CM | POA: Diagnosis not present

## 2019-06-11 DIAGNOSIS — Z5111 Encounter for antineoplastic chemotherapy: Secondary | ICD-10-CM | POA: Diagnosis not present

## 2019-06-11 DIAGNOSIS — D696 Thrombocytopenia, unspecified: Secondary | ICD-10-CM

## 2019-06-11 LAB — CMP (CANCER CENTER ONLY)
ALT: 21 U/L (ref 0–44)
AST: 38 U/L (ref 15–41)
Albumin: 3.7 g/dL (ref 3.5–5.0)
Alkaline Phosphatase: 172 U/L — ABNORMAL HIGH (ref 38–126)
Anion gap: 10 (ref 5–15)
BUN: 5 mg/dL — ABNORMAL LOW (ref 8–23)
CO2: 23 mmol/L (ref 22–32)
Calcium: 9 mg/dL (ref 8.9–10.3)
Chloride: 89 mmol/L — ABNORMAL LOW (ref 98–111)
Creatinine: 0.67 mg/dL (ref 0.44–1.00)
GFR, Est AFR Am: >60 mL/min (ref >60)
GFR, Estimated: >60 mL/min (ref >60)
Glucose, Bld: 106 mg/dL — ABNORMAL HIGH (ref 70–99)
Potassium: 4 mmol/L (ref 3.5–5.1)
Sodium: 122 mmol/L — ABNORMAL LOW (ref 135–145)
Total Bilirubin: 0.5 mg/dL (ref 0.3–1.2)
Total Protein: 7.2 g/dL (ref 6.5–8.1)

## 2019-06-11 LAB — CBC WITH DIFFERENTIAL (CANCER CENTER ONLY)
Abs Immature Granulocytes: 0.01 10*3/uL (ref 0.00–0.07)
Basophils Absolute: 0 10*3/uL (ref 0.0–0.1)
Basophils Relative: 1 %
Eosinophils Absolute: 0.1 10*3/uL (ref 0.0–0.5)
Eosinophils Relative: 3 %
HCT: 26.3 % — ABNORMAL LOW (ref 36.0–46.0)
Hemoglobin: 9.4 g/dL — ABNORMAL LOW (ref 12.0–15.0)
Immature Granulocytes: 1 %
Lymphocytes Relative: 29 %
Lymphs Abs: 0.5 10*3/uL — ABNORMAL LOW (ref 0.7–4.0)
MCH: 35.2 pg — ABNORMAL HIGH (ref 26.0–34.0)
MCHC: 35.7 g/dL (ref 30.0–36.0)
MCV: 98.5 fL (ref 80.0–100.0)
Monocytes Absolute: 0.4 10*3/uL (ref 0.1–1.0)
Monocytes Relative: 25 %
Neutro Abs: 0.7 10*3/uL — ABNORMAL LOW (ref 1.7–7.7)
Neutrophils Relative %: 41 %
Platelet Count: 91 10*3/uL — ABNORMAL LOW (ref 150–400)
RBC: 2.67 MIL/uL — ABNORMAL LOW (ref 3.87–5.11)
RDW: 20 % — ABNORMAL HIGH (ref 11.5–15.5)
WBC Count: 1.8 10*3/uL — ABNORMAL LOW (ref 4.0–10.5)
nRBC: 0 % (ref 0.0–0.2)

## 2019-06-11 LAB — MAGNESIUM: Magnesium: 1.6 mg/dL — ABNORMAL LOW (ref 1.7–2.4)

## 2019-06-11 MED ORDER — SODIUM CHLORIDE 0.9% FLUSH
10.0000 mL | INTRAVENOUS | Status: DC | PRN
  Administered 2019-06-11: 10 mL
  Filled 2019-06-11: qty 10

## 2019-06-11 MED ORDER — HEPARIN SOD (PORK) LOCK FLUSH 100 UNIT/ML IV SOLN
500.0000 [IU] | Freq: Once | INTRAVENOUS | Status: AC | PRN
  Administered 2019-06-11: 10:00:00 500 [IU]
  Filled 2019-06-11: qty 5

## 2019-06-11 NOTE — Telephone Encounter (Signed)
I need to tell her that her sodium is low today. She needs to make sure she still is taking her demeclocycline and needs to restrict her water intake.   Left message to call nurse. Need to make aware of message above from [place]. NP

## 2019-06-11 NOTE — Progress Notes (Signed)
No treatment today per Dr. Julien Nordmann

## 2019-06-11 NOTE — Progress Notes (Signed)
[name] OFFICE PROGRESS NOTE  [name], MD [place] [address]  DIAGNOSIS: Extensive stage (T3, N2, M1a)small cell lung cancer diagnosed in August 2019 and presented with large left hilar/subhilar mass in addition to mediastinal lymphadenopathy and contracture and mass in the right upper lobe.  PRIOR THERAPY: 1)Systemic therapy with carboplatin for AUC of 5 on day 1, etoposide 100 mg/M2 on days 1, 2 and 3 as well as Tecentriq (Atezolizumab) 1200 mg IV every 3 weeks with Neulasta support. Status post 10 cycles. Starting from cycle #7, the patient will be treated with only single agent immunotherapy with Tecentriq 1200 mg IV every 3 weeks. Last dose was given January 27, 2019 discontinued secondary to disease progression. 2)palliative radiotherapy to the mediastinal lymph nodes.  CURRENT THERAPY: Systemic chemotherapy with cisplatin 30 mg/M2 and irinotecan 65 mg/M2 every 3 weeks. First dose Apr 16, 2019.Status post2cycles. Her dose will be reduced to Cisplatin 25 mg/m2 and irinotecan 50 mg/m2 starting cycle #3.   INTERVAL HISTORY: [name] 63 y.o. female returns to the clinic for a follow-up visit.  The patient is feeling well today without any concerning complaints. The patient's treatment last week was delayed secondary to neutropenia and thrombocytopenia. She also had electrolyte abnormalities and received IV potassium and magnesium last week. She denies any fever, chills, night sweats, or weight loss.  She denies any chest pain, shortness of breath, cough, or hemoptysis.  She denies any nausea, vomiting, diarrhea, or constipation.  She denies any headache or visual changes. She is here today for evaluation before starting cycle #3.   MEDICAL HISTORY: Past Medical History:  Diagnosis Date  . Alpha-1-antitrypsin deficiency ([place])   . Cervical cancer ([place]) 2004  . 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 This doesn't fit entirely with emphysema.      . Exposure to hepatitis C 09/12/2016  . Fibromyalgia   . GERD (gastroesophageal reflux disease)   . Hyperlipidemia   . IBS (irritable bowel syndrome)   . Obstructive sleep apnea 06/02/2008   NPSG 06/07/08, AHI 6.9/ hr, weight 220 lbs    . Osteoporosis   . Pulmonary fibrosis, unspecified ([place]) 09/29/2016   CXR 03/28/2016  . Rhinitis, nonallergic 05/08/2012  . 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 Vitamin D deficiency     ALLERGIES:  is allergic to penicillins; actonel [risedronate sodium]; advair diskus [fluticasone-salmeterol]; alendronate sodium; aspirin; azithromycin; keflex [cephalexin]; levofloxacin; morphine; nsaids; omnicef [cefdinir]; and erythromycin.  MEDICATIONS:  Current Outpatient Medications  Medication Sig Dispense Refill  . albuterol (PROVENTIL) (2.5 MG/3ML) 0.083% nebulizer solution Take 3 mLs (2.5 mg total) by nebulization every 6 (six) hours as needed. 90 mL 4  . allopurinol (ZYLOPRIM) 300 MG tablet Take 1 tablet (300 mg total) by mouth daily. Begin on 07/25/2018 14 tablet 0  . amitriptyline (ELAVIL) 75 MG tablet Take 75 mg by mouth at bedtime.      Marland Kitchen demeclocycline (DECLOMYCIN) 150 MG tablet TAKE  (1)  TABLET TWICE A DAY. 60 tablet 0  . ezetimibe (ZETIA) 10 MG tablet TAKE 1 TABLET ONCE A DAY 90 tablet 0  . feeding supplement, ENSURE ENLIVE, (ENSURE ENLIVE) LIQD Take 237 mLs by mouth 2 (two) times daily between meals. 60 Bottle 0  . fluticasone (FLONASE) 50 MCG/ACT nasal  spray USE 2 SPRAYS IN EACH NOSTRIL ONCE DAILY. 16 g 5  . folic acid (FOLVITE) 1 MG tablet Take 1 mg by mouth daily.    . Glycopyrrolate-Formoterol (BEVESPI AEROSPHERE) 9-4.8 MCG/ACT AERO Inhale 2 puffs into the lungs 2 (two) times daily. 1 Inhaler 12  . HYDROcodone-acetaminophen  (NORCO/VICODIN) 5-325 MG tablet Take 1 tablet by mouth 3 (three) times daily. 30 tablet 0  . levothyroxine (SYNTHROID) 75 MCG tablet TAKE (1) TABLET DAILY BE- FORE BREAKFAST. 30 tablet 0  . lidocaine-prilocaine (EMLA) cream Apply 1 application topically as needed. 30 g 0  . Nebulizers (COMPRESSOR NEBULIZER) MISC 1 Device by Does not apply route as needed. 1 each prn  . omeprazole (PRILOSEC) 40 MG capsule TAKE (1) CAPSULE DAILY 90 capsule 0  . potassium chloride SA (K-DUR) 20 MEQ tablet Take 1 tablet (20 mEq total) by mouth 2 (two) times daily. 14 tablet 0  . PROAIR HFA 108 (90 Base) MCG/ACT inhaler 2 PUFFS EVERY 4 HOURS AS NEEDED FOR WHEEZING 8.5 g 0  . prochlorperazine (COMPAZINE) 10 MG tablet Take 1 tablet (10 mg total) by mouth every 6 (six) hours as needed for nausea or vomiting. 30 tablet 1  . rosuvastatin (CRESTOR) 40 MG tablet Take 0.5 tablets (20 mg total) by mouth daily. 45 tablet 3   No current facility-administered medications for this visit.    Facility-Administered Medications Ordered in Other Visits  Medication Dose Route Frequency Provider Last Rate Last Dose  . sodium chloride flush (NS) 0.9 % injection 10 mL  10 mL Intracatheter PRN Curt Bears, MD   10 mL at 06/11/19 3016    SURGICAL HISTORY:  Past Surgical History:  Procedure Laterality Date  . ABDOMINAL HYSTERECTOMY    . IR IMAGING GUIDED PORT INSERTION  08/02/2018  . LUMBAR DISC SURGERY    . VIDEO BRONCHOSCOPY Bilateral 07/10/2018   Procedure: VIDEO BRONCHOSCOPY WITHOUT FLUORO;  Surgeon: Laurin Coder, MD;  Location: [facility];  Service: Cardiopulmonary;  Laterality: Bilateral;    REVIEW OF SYSTEMS:   Review of Systems  Constitutional: Negative for appetite change, chills, fatigue, fever and unexpected weight change.  HENT:   Negative for mouth sores, nosebleeds, sore throat and trouble swallowing.   Eyes: Negative for eye problems and icterus.  Respiratory: Negative for cough, hemoptysis, shortness of  breath and wheezing.   Cardiovascular: Negative for chest pain and leg swelling.  Gastrointestinal: Negative for abdominal pain, constipation, diarrhea, nausea and vomiting.  Genitourinary: Negative for bladder incontinence, difficulty urinating, dysuria, frequency and hematuria.   Musculoskeletal: Negative for back pain, gait problem, neck pain and neck stiffness.  Skin: Negative for itching and rash.  Neurological: Negative for dizziness, extremity weakness, gait problem, headaches, light-headedness and seizures.  Hematological: Negative for adenopathy. Does not bruise/bleed easily.  Psychiatric/Behavioral: Negative for confusion, depression and sleep disturbance. The patient is not nervous/anxious.     PHYSICAL EXAMINATION:  Blood pressure 136/81, pulse 89, temperature 98 F (36.7 C), resp. rate 20, weight 182 lb 3 oz (82.6 kg), SpO2 97 %.  ECOG PERFORMANCE STATUS: 1 - Symptomatic but completely ambulatory  Physical Exam  Constitutional: Oriented to person, place, and time and well-developed, well-nourished, and in no distress.  HENT:  Head: Normocephalic and atraumatic.  Mouth/Throat: Oropharynx is clear and moist. No oropharyngeal exudate.  Eyes: Conjunctivae are normal. Right eye exhibits no discharge. Left eye exhibits no discharge. No scleral icterus.  Neck: Normal range of motion. Neck supple.  Cardiovascular: Normal rate, regular rhythm, normal heart  sounds and intact distal pulses.   Pulmonary/Chest: Crackles on the lower lung bases bilaterally. Effort normal and breath sounds normal. No respiratory distress. No wheezes.  Abdominal: Soft. Bowel sounds are normal. Exhibits no distension and no mass. There is no tenderness.  Musculoskeletal: Normal range of motion. Exhibits no edema.  Lymphadenopathy:    No cervical adenopathy.  Neurological: Alert and oriented to person, place, and time. Exhibits normal muscle tone. Gait normal. Coordination normal.  Skin: Skin is warm and  dry. No rash noted. Not diaphoretic. No erythema. No pallor.  Psychiatric: Mood, memory and judgment normal.  Vitals reviewed.  LABORATORY DATA: Lab Results  Component Value Date   WBC 1.8 (L) 06/11/2019   HGB 9.4 (L) 06/11/2019   HCT 26.3 (L) 06/11/2019   MCV 98.5 06/11/2019   PLT 91 (L) 06/11/2019      Chemistry      Component Value Date/Time   NA 122 (L) 06/11/2019 0812   NA 124 (L) 07/16/2018 0853   K 4.0 06/11/2019 0812   CL 89 (L) 06/11/2019 0812   CO2 23 06/11/2019 0812   BUN 5 (L) 06/11/2019 0812   BUN 8 07/16/2018 0853   CREATININE 0.67 06/11/2019 0812   CREATININE 0.68 02/13/2013 1523      Component Value Date/Time   CALCIUM 9.0 06/11/2019 0812   ALKPHOS 172 (H) 06/11/2019 0812   AST 38 06/11/2019 0812   ALT 21 06/11/2019 0812   BILITOT 0.5 06/11/2019 2778       RADIOGRAPHIC STUDIES:  Ct Chest W Contrast  Result Date: 05/29/2019 CLINICAL DATA:  63 year old female for restaging of metastatic small cell LEFT lung cancer. Patient is currently on chemotherapy. Completed immunotherapy and radiation therapy. EXAM: CT CHEST, ABDOMEN, AND PELVIS WITH CONTRAST TECHNIQUE: Multidetector CT imaging of the chest, abdomen and pelvis was performed following the standard protocol during bolus administration of intravenous contrast. CONTRAST:  185mL OMNIPAQUE IOHEXOL 300 MG/ML  SOLN COMPARISON:  04/04/2019 and prior CTs FINDINGS: CT CHEST FINDINGS Cardiovascular: Heart size is normal. Coronary artery and aortic atherosclerotic calcifications again identified without thoracic aortic aneurysm or pericardial effusion. A RIGHT Port-A-Cath is again identified with tip in the RIGHT atrium. Mediastinum/Nodes: No enlarged lymph nodes are identified. No mediastinal mass noted. The visualized esophagus is unremarkable. Lungs/Pleura: The central LEFT hilar/lung mass has decreased in size with measurable soft tissue now 1 x 1 cm (series 2: Image 29), previously 3.5 x 2 cm on 04/04/2019. No new  pulmonary mass or nodules are identified. No airspace disease, consolidation, pleural effusion or pneumothorax. Emphysema again noted. Minimal subsegmental atelectasis/scarring in the lung bases again identified. Musculoskeletal: No acute or suspicious bony abnormalities are noted. CT ABDOMEN PELVIS FINDINGS Hepatobiliary: Morphologic changes of cirrhosis again noted. Scattered ill-defined hypodense lesions within the liver do not appear significantly changed with index lesions as follows: A 1.3 cm LEFT LATERAL hepatic lobe lesion (2:59) A 1.3 cm central RIGHT hepatic lesion (2:50). No new hepatic lesions are noted. The gallbladder is unremarkable.  No biliary dilatation. Pancreas: Unremarkable Spleen: Unremarkable Adrenals/Urinary Tract: The kidneys, adrenal glands and bladder are unremarkable. Stomach/Bowel: Stomach is within normal limits. Appendix appears normal. No evidence of bowel wall thickening, distention, or inflammatory changes. Vascular/Lymphatic: UPPER abdominal, retroperitoneal and periportal lymph nodes are smaller since the prior study, now normal in size. An index periportal node now measures 1.2 cm in short axis (2:63), previously 2 cm. No new or enlarging lymph nodes are identified. Aortic atherosclerotic calcifications noted without aneurysm. Reproductive:  Status post hysterectomy. No adnexal masses. Other: No ascites, focal collection or pneumoperitoneum. Musculoskeletal: No acute or suspicious bony abnormalities. 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 IMPRESSION: 1. Treatment response with decreased size of LEFT hilar/perihilar mass and enlarged UPPER abdominal/retroperitoneal lymph nodes. 2. Multiple hepatic lesions without significant change. 3. Cirrhosis 4. Coronary artery disease 5. Aortic Atherosclerosis (ICD10-I70.0) and Emphysema (ICD10-J43.9). Electronically Signed   By: Margarette Canada M.D.   On: 05/29/2019 09:15   Ct Abdomen Pelvis W Contrast  Result Date:  05/29/2019 CLINICAL DATA:  63 year old female for restaging of metastatic small cell LEFT lung cancer. Patient is currently on chemotherapy. Completed immunotherapy and radiation therapy. EXAM: CT CHEST, ABDOMEN, AND PELVIS WITH CONTRAST TECHNIQUE: Multidetector CT imaging of the chest, abdomen and pelvis was performed following the standard protocol during bolus administration of intravenous contrast. CONTRAST:  120mL OMNIPAQUE IOHEXOL 300 MG/ML  SOLN COMPARISON:  04/04/2019 and prior CTs FINDINGS: CT CHEST FINDINGS Cardiovascular: Heart size is normal. Coronary artery and aortic atherosclerotic calcifications again identified without thoracic aortic aneurysm or pericardial effusion. A RIGHT Port-A-Cath is again identified with tip in the RIGHT atrium. Mediastinum/Nodes: No enlarged lymph nodes are identified. No mediastinal mass noted. The visualized esophagus is unremarkable. Lungs/Pleura: The central LEFT hilar/lung mass has decreased in size with measurable soft tissue now 1 x 1 cm (series 2: Image 29), previously 3.5 x 2 cm on 04/04/2019. No new pulmonary mass or nodules are identified. No airspace disease, consolidation, pleural effusion or pneumothorax. Emphysema again noted. Minimal subsegmental atelectasis/scarring in the lung bases again identified. Musculoskeletal: No acute or suspicious bony abnormalities are noted. CT ABDOMEN PELVIS FINDINGS Hepatobiliary: Morphologic changes of cirrhosis again noted. Scattered ill-defined hypodense lesions within the liver do not appear significantly changed with index lesions as follows: A 1.3 cm LEFT LATERAL hepatic lobe lesion (2:59) A 1.3 cm central RIGHT hepatic lesion (2:50). No new hepatic lesions are noted. The gallbladder is unremarkable.  No biliary dilatation. Pancreas: Unremarkable Spleen: Unremarkable Adrenals/Urinary Tract: The kidneys, adrenal glands and bladder are unremarkable. Stomach/Bowel: Stomach is within normal limits. Appendix appears normal. No  evidence of bowel wall thickening, distention, or inflammatory changes. Vascular/Lymphatic: UPPER abdominal, retroperitoneal and periportal lymph nodes are smaller since the prior study, now normal in size. An index periportal node now measures 1.2 cm in short axis (2:63), previously 2 cm. No new or enlarging lymph nodes are identified. Aortic atherosclerotic calcifications noted without aneurysm. Reproductive: Status post hysterectomy. No adnexal masses. Other: No ascites, focal collection or pneumoperitoneum. Musculoskeletal: No acute or suspicious bony abnormalities. 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 IMPRESSION: 1. Treatment response with decreased size of LEFT hilar/perihilar mass and enlarged UPPER abdominal/retroperitoneal lymph nodes. 2. Multiple hepatic lesions without significant change. 3. Cirrhosis 4. Coronary artery disease 5. Aortic Atherosclerosis (ICD10-I70.0) and Emphysema (ICD10-J43.9). Electronically Signed   By: Margarette Canada M.D.   On: 05/29/2019 09:15     ASSESSMENT/PLAN:  This is a very pleasant 63 year old Caucasian female diagnosed with extensive stage small cell lung cancer. She presented with a large left perihilar mass as well as mediastinal lymphadenopathy as well as metastatic disease to the liver and bone. She wasdiagnosed in August 2019.  The patient was previously treated with systemic chemotherapy with carboplatin, etoposide, and Tecentriq. She is status post 6 cycles. She then was treated withsingle agent maintenance Tecentriq for 4 cycles. This was discontinued due to evidence of disease progression.  She underwent palliative radiotherapy to the  progressive disease involving the lung and mediastinum.  She is currently undergoing treatment with cisplatin at a reduced dose of 30 mg/m andirinotecan65 mg/m on days 1 and 8 every 3 weeks. She is status post 2 cycles. She tolerated it well except for fatigue, electrolyte disturbances, and  pancytopenia requiring granix and dose delays. Her dose will be reduced to 25 mg/m2 of Cisplatin and 50 mg/m2 of irinotecan starting from cycle #3.  The patient was seen with Dr. Julien Nordmann today. Labs were reviewed.She still continues to have neutropenia and thrombocytopenia. We will delay her treatment by 1 week.   The patient has a history of hyponatremia. She is currently on demeclocycline. I will reach out to the patient to ensure that she is still taking this medication as prescribed and that she is following her fluid restriction.   We will see her back for a follow up visit in 1 week for evaluation before starting cycle #3.   I have sent a scheduling message to adjust her appointments.   The patient was advised to call immediately if she has any concerning symptoms in the interval. The patient voices understanding of current disease status and treatment options and is in agreement with the current care plan. All questions were answered. The patient knows to call the clinic with any problems, questions or concerns. We can certainly see the patient much sooner if necessary  No orders of the defined types were placed in this encounter.    Dashae Wilcher L Laqueshia Cihlar, PA-C 06/11/19  ADDENDUM: Hematology/Oncology Attending: I had a face-to-face encounter with the patient today.  I recommended her care plan.  This is a very pleasant 63 years old white female with extensive stage small cell lung cancer status post initial systemic chemotherapy with carboplatin, etoposide and Tecentriq with initial partial response followed by disease progression.  She is currently undergoing second line treatment with reduced dose cisplatin and irinotecan status post 2 cycles with good response to the treatment. Unfortunately her treatment is complicated with significant pancytopenia it was delayed from last week. Repeat CBC today showed persistent leukocytopenia and neutropenia as well as mild  thrombocytopenia. I recommended for the patient to delay her treatment by 1 more week recovery of her blood count. For the electrolyte imbalance, will arrange for the patient to receive magnesium sulfate today. For the hyponatremia she will continue her current treatment with demeclocycline and we advised her with the fluid restriction. She will come back for follow-up visit in 1 week for evaluation before resuming her treatment. The patient was advised to call immediately if she has any concerning symptoms in the interval.  Disclaimer: This note was dictated with voice recognition software. Similar sounding words can inadvertently be transcribed and may be missed upon review. Eilleen Kempf, MD 06/11/19

## 2019-06-11 NOTE — Telephone Encounter (Signed)
Added an MD visit to next week appt.

## 2019-06-11 NOTE — Telephone Encounter (Signed)
Notified of message below

## 2019-06-18 ENCOUNTER — Inpatient Hospital Stay: Payer: Medicare Other

## 2019-06-18 ENCOUNTER — Other Ambulatory Visit: Payer: Self-pay | Admitting: Internal Medicine

## 2019-06-18 ENCOUNTER — Encounter: Payer: Self-pay | Admitting: Physician Assistant

## 2019-06-18 ENCOUNTER — Other Ambulatory Visit: Payer: Self-pay

## 2019-06-18 ENCOUNTER — Inpatient Hospital Stay (HOSPITAL_BASED_OUTPATIENT_CLINIC_OR_DEPARTMENT_OTHER): Payer: Medicare Other | Admitting: Physician Assistant

## 2019-06-18 VITALS — BP 126/70 | HR 77 | Temp 97.8°F | Resp 18 | Ht 65.0 in | Wt 181.8 lb

## 2019-06-18 DIAGNOSIS — D696 Thrombocytopenia, unspecified: Secondary | ICD-10-CM | POA: Diagnosis not present

## 2019-06-18 DIAGNOSIS — E559 Vitamin D deficiency, unspecified: Secondary | ICD-10-CM | POA: Diagnosis not present

## 2019-06-18 DIAGNOSIS — Z5111 Encounter for antineoplastic chemotherapy: Secondary | ICD-10-CM

## 2019-06-18 DIAGNOSIS — C3492 Malignant neoplasm of unspecified part of left bronchus or lung: Secondary | ICD-10-CM

## 2019-06-18 DIAGNOSIS — E875 Hyperkalemia: Secondary | ICD-10-CM

## 2019-06-18 DIAGNOSIS — D61818 Other pancytopenia: Secondary | ICD-10-CM | POA: Diagnosis not present

## 2019-06-18 DIAGNOSIS — C7951 Secondary malignant neoplasm of bone: Secondary | ICD-10-CM

## 2019-06-18 DIAGNOSIS — Z8541 Personal history of malignant neoplasm of cervix uteri: Secondary | ICD-10-CM

## 2019-06-18 DIAGNOSIS — Z79899 Other long term (current) drug therapy: Secondary | ICD-10-CM

## 2019-06-18 DIAGNOSIS — E876 Hypokalemia: Secondary | ICD-10-CM | POA: Diagnosis not present

## 2019-06-18 DIAGNOSIS — C787 Secondary malignant neoplasm of liver and intrahepatic bile duct: Secondary | ICD-10-CM

## 2019-06-18 DIAGNOSIS — J449 Chronic obstructive pulmonary disease, unspecified: Secondary | ICD-10-CM | POA: Diagnosis not present

## 2019-06-18 DIAGNOSIS — D61810 Antineoplastic chemotherapy induced pancytopenia: Secondary | ICD-10-CM

## 2019-06-18 DIAGNOSIS — T451X5A Adverse effect of antineoplastic and immunosuppressive drugs, initial encounter: Secondary | ICD-10-CM

## 2019-06-18 DIAGNOSIS — Z7951 Long term (current) use of inhaled steroids: Secondary | ICD-10-CM | POA: Diagnosis not present

## 2019-06-18 DIAGNOSIS — Z95828 Presence of other vascular implants and grafts: Secondary | ICD-10-CM

## 2019-06-18 DIAGNOSIS — E785 Hyperlipidemia, unspecified: Secondary | ICD-10-CM | POA: Diagnosis not present

## 2019-06-18 DIAGNOSIS — E871 Hypo-osmolality and hyponatremia: Secondary | ICD-10-CM | POA: Diagnosis not present

## 2019-06-18 DIAGNOSIS — D559 Anemia due to enzyme disorder, unspecified: Secondary | ICD-10-CM

## 2019-06-18 LAB — CBC WITH DIFFERENTIAL (CANCER CENTER ONLY)
Abs Immature Granulocytes: 0.02 10*3/uL (ref 0.00–0.07)
Basophils Absolute: 0 10*3/uL (ref 0.0–0.1)
Basophils Relative: 1 %
Eosinophils Absolute: 0 10*3/uL (ref 0.0–0.5)
Eosinophils Relative: 1 %
HCT: 26 % — ABNORMAL LOW (ref 36.0–46.0)
Hemoglobin: 9.4 g/dL — ABNORMAL LOW (ref 12.0–15.0)
Immature Granulocytes: 1 %
Lymphocytes Relative: 22 %
Lymphs Abs: 0.5 10*3/uL — ABNORMAL LOW (ref 0.7–4.0)
MCH: 35.6 pg — ABNORMAL HIGH (ref 26.0–34.0)
MCHC: 36.2 g/dL — ABNORMAL HIGH (ref 30.0–36.0)
MCV: 98.5 fL (ref 80.0–100.0)
Monocytes Absolute: 0.4 10*3/uL (ref 0.1–1.0)
Monocytes Relative: 19 %
Neutro Abs: 1.3 10*3/uL — ABNORMAL LOW (ref 1.7–7.7)
Neutrophils Relative %: 56 %
Platelet Count: 86 10*3/uL — ABNORMAL LOW (ref 150–400)
RBC: 2.64 MIL/uL — ABNORMAL LOW (ref 3.87–5.11)
RDW: 18.6 % — ABNORMAL HIGH (ref 11.5–15.5)
WBC Count: 2.3 10*3/uL — ABNORMAL LOW (ref 4.0–10.5)
nRBC: 0 % (ref 0.0–0.2)

## 2019-06-18 LAB — CMP (CANCER CENTER ONLY)
ALT: 21 U/L (ref 0–44)
AST: 31 U/L (ref 15–41)
Albumin: 3.6 g/dL (ref 3.5–5.0)
Alkaline Phosphatase: 169 U/L — ABNORMAL HIGH (ref 38–126)
Anion gap: 10 (ref 5–15)
BUN: 6 mg/dL — ABNORMAL LOW (ref 8–23)
CO2: 26 mmol/L (ref 22–32)
Calcium: 9.1 mg/dL (ref 8.9–10.3)
Chloride: 90 mmol/L — ABNORMAL LOW (ref 98–111)
Creatinine: 0.66 mg/dL (ref 0.44–1.00)
GFR, Est AFR Am: >60 mL/min (ref >60)
GFR, Estimated: >60 mL/min (ref >60)
Glucose, Bld: 103 mg/dL — ABNORMAL HIGH (ref 70–99)
Potassium: 3.1 mmol/L — ABNORMAL LOW (ref 3.5–5.1)
Sodium: 126 mmol/L — ABNORMAL LOW (ref 135–145)
Total Bilirubin: 0.4 mg/dL (ref 0.3–1.2)
Total Protein: 7 g/dL (ref 6.5–8.1)

## 2019-06-18 LAB — MAGNESIUM: Magnesium: 1.7 mg/dL (ref 1.7–2.4)

## 2019-06-18 MED ORDER — SODIUM CHLORIDE 0.9 % IV SOLN
Freq: Once | INTRAVENOUS | Status: AC
  Administered 2019-06-18: 09:00:00 via INTRAVENOUS
  Filled 2019-06-18: qty 250

## 2019-06-18 MED ORDER — ATROPINE SULFATE 1 MG/ML IJ SOLN
0.4000 mg | Freq: Once | INTRAMUSCULAR | Status: AC
  Administered 2019-06-18: 0.4 mg via INTRAVENOUS

## 2019-06-18 MED ORDER — PALONOSETRON HCL INJECTION 0.25 MG/5ML
0.2500 mg | Freq: Once | INTRAVENOUS | Status: AC
  Administered 2019-06-18: 0.25 mg via INTRAVENOUS

## 2019-06-18 MED ORDER — POTASSIUM CHLORIDE 2 MEQ/ML IV SOLN
Freq: Once | INTRAVENOUS | Status: AC
  Administered 2019-06-18: 10:00:00 via INTRAVENOUS
  Filled 2019-06-18: qty 1000

## 2019-06-18 MED ORDER — SODIUM CHLORIDE 0.9% FLUSH
10.0000 mL | INTRAVENOUS | Status: DC | PRN
  Administered 2019-06-18: 08:00:00 10 mL
  Filled 2019-06-18: qty 10

## 2019-06-18 MED ORDER — PALONOSETRON HCL INJECTION 0.25 MG/5ML
INTRAVENOUS | Status: AC
  Filled 2019-06-18: qty 5

## 2019-06-18 MED ORDER — ATROPINE SULFATE 1 MG/ML IJ SOLN
INTRAMUSCULAR | Status: AC
  Filled 2019-06-18: qty 1

## 2019-06-18 MED ORDER — SODIUM CHLORIDE 0.9 % IV SOLN
25.0000 mg/m2 | Freq: Once | INTRAVENOUS | Status: AC
  Administered 2019-06-18: 50 mg via INTRAVENOUS
  Filled 2019-06-18: qty 50

## 2019-06-18 MED ORDER — IRINOTECAN HCL CHEMO INJECTION 100 MG/5ML
50.0000 mg/m2 | Freq: Once | INTRAVENOUS | Status: AC
  Administered 2019-06-18: 100 mg via INTRAVENOUS
  Filled 2019-06-18: qty 5

## 2019-06-18 MED ORDER — SODIUM CHLORIDE 0.9% FLUSH
10.0000 mL | INTRAVENOUS | Status: DC | PRN
  Administered 2019-06-18: 18:00:00 10 mL
  Filled 2019-06-18: qty 10

## 2019-06-18 MED ORDER — HEPARIN SOD (PORK) LOCK FLUSH 100 UNIT/ML IV SOLN
500.0000 [IU] | Freq: Once | INTRAVENOUS | Status: AC | PRN
  Administered 2019-06-18: 500 [IU]
  Filled 2019-06-18: qty 5

## 2019-06-18 MED ORDER — ATROPINE SULFATE 1 MG/ML IJ SOLN: 0.5000 mg | Freq: Once | INTRAMUSCULAR | Status: DC | PRN

## 2019-06-18 MED ORDER — SODIUM CHLORIDE 0.9 % IV SOLN
Freq: Once | INTRAVENOUS | Status: AC
  Administered 2019-06-18: 12:00:00 via INTRAVENOUS
  Filled 2019-06-18: qty 5

## 2019-06-18 NOTE — Progress Notes (Signed)
Per Cassie, PA: okay to treat with current ANC & Scr. Levels.

## 2019-06-18 NOTE — Progress Notes (Signed)
[facility] OFFICE PROGRESS NOTE  [name], MD [place] [address]  DIAGNOSIS: Extensive stage (T3, N2, M1a)small cell lung cancer diagnosed in August 2019 and presented with large left hilar/subhilar mass in addition to mediastinal lymphadenopathy and contracture and mass in the right upper lobe.  PRIOR THERAPY:  1)Systemic therapy with carboplatin for AUC of 5 on day 1, etoposide 100 mg/M2 on days 1, 2 and 3 as well as Tecentriq (Atezolizumab) 1200 mg IV every 3 weeks with Neulasta support. Status post 10 cycles. Starting from cycle #7, the patient will be treated with only single agent immunotherapy with Tecentriq 1200 mg IV every 3 weeks. Last dose was given January 27, 2019 discontinued secondary to disease progression. 2)palliative radiotherapy to the mediastinal lymph nodes.  CURRENT THERAPY: Systemic chemotherapy with cisplatin30mg /M2 and irinotecan 65mg /M2 every 3 weeks. First dose Apr 16, 2019.Status post2cycles.Her dose will be reduced to Cisplatin 25 mg/m2 and irinotecan 50 mg/m2 starting cycle #3.  INTERVAL HISTORY: [name] 63 y.o. female returns to the clinic for a follow up visit. The patient is feeling well today without any concerning complaints. The patient has tolerated her last two cycles of treatment well; however, cycle #3 has been delayed the last two weeks secondary to pancytopenia. She denies any fevers, chills, night sweats, or weight loss. She denies any chest pain, shortness of breath, cough, or hemoptysis. She denies any nausea, vomiting, diarrhea, or constipation. She denies any headaches or visual changes. She is here for evaluation before starting cycle #3.   MEDICAL HISTORY: Past Medical History:  Diagnosis Date  . Alpha-1-antitrypsin deficiency ([place])   . Cervical cancer ([place]) 2004  . 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 This doesn't fit entirely with emphysema.      . Exposure to hepatitis C 09/12/2016  . Fibromyalgia   . GERD (gastroesophageal reflux disease)   . Hyperlipidemia   . IBS (irritable bowel syndrome)   . Obstructive sleep apnea 06/02/2008   NPSG 06/07/08, AHI 6.9/ hr, weight 220 lbs    . Osteoporosis   . Pulmonary fibrosis, unspecified ([place]) 09/29/2016   CXR 03/28/2016  . Rhinitis, nonallergic 05/08/2012  . 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 Vitamin D deficiency     ALLERGIES:  is allergic to penicillins; actonel [risedronate sodium]; advair diskus [fluticasone-salmeterol]; alendronate sodium; aspirin; azithromycin; keflex [cephalexin]; levofloxacin; morphine; nsaids; omnicef [cefdinir]; and erythromycin.  MEDICATIONS:  Current Outpatient Medications  Medication Sig Dispense Refill  . albuterol (PROVENTIL) (2.5 MG/3ML) 0.083% nebulizer solution Take 3 mLs (2.5 mg total) by nebulization every 6 (six) hours as needed. 90 mL 4  . amitriptyline (ELAVIL) 75 MG tablet Take 75 mg by mouth at bedtime.      Marland Kitchen demeclocycline (DECLOMYCIN) 150 MG tablet TAKE  (1)  TABLET TWICE A DAY. 60 tablet 0  . ezetimibe (ZETIA) 10 MG tablet TAKE 1 TABLET ONCE A DAY 90 tablet 0  . feeding supplement, ENSURE ENLIVE, (ENSURE ENLIVE) LIQD Take 237 mLs by mouth 2 (two) times daily between meals. 60 Bottle 0  . fluticasone (FLONASE) 50 MCG/ACT nasal spray USE 2 SPRAYS IN EACH NOSTRIL ONCE DAILY. 16 g 5  . folic acid (FOLVITE) 1 MG tablet Take 1 mg by mouth daily.    . Glycopyrrolate-Formoterol (  BEVESPI AEROSPHERE) 9-4.8 MCG/ACT AERO Inhale 2 puffs into the lungs 2 (two) times daily. 1 Inhaler 12  . HYDROcodone-acetaminophen (NORCO/VICODIN) 5-325 MG tablet Take 1 tablet by mouth 3 (three) times daily. 30 tablet 0  . levothyroxine (SYNTHROID) 75 MCG tablet TAKE (1) TABLET DAILY BE- FORE  BREAKFAST. 30 tablet 0  . lidocaine-prilocaine (EMLA) cream Apply 1 application topically as needed. 30 g 0  . Nebulizers (COMPRESSOR NEBULIZER) MISC 1 Device by Does not apply route as needed. 1 each prn  . omeprazole (PRILOSEC) 40 MG capsule TAKE (1) CAPSULE DAILY 90 capsule 0  . potassium chloride SA (K-DUR) 20 MEQ tablet Take 1 tablet (20 mEq total) by mouth 2 (two) times daily. 14 tablet 0  . PROAIR HFA 108 (90 Base) MCG/ACT inhaler 2 PUFFS EVERY 4 HOURS AS NEEDED FOR WHEEZING 8.5 g 0  . rosuvastatin (CRESTOR) 40 MG tablet Take 0.5 tablets (20 mg total) by mouth daily. 45 tablet 3  . allopurinol (ZYLOPRIM) 300 MG tablet Take 1 tablet (300 mg total) by mouth daily. Begin on 07/25/2018 14 tablet 0  . prochlorperazine (COMPAZINE) 10 MG tablet Take 1 tablet (10 mg total) by mouth every 6 (six) hours as needed for nausea or vomiting. (Patient not taking: Reported on 06/18/2019) 30 tablet 1   No current facility-administered medications for this visit.    Facility-Administered Medications Ordered in Other Visits  Medication Dose Route Frequency Provider Last Rate Last Dose  . dextrose 5 % and 0.45% NaCl 1,000 mL with potassium chloride 40 mEq, magnesium sulfate 12 mEq infusion   Intravenous Once Curt Bears, MD      . heparin lock flush 100 unit/mL  500 Units Intracatheter Once PRN Curt Bears, MD      . sodium chloride flush (NS) 0.9 % injection 10 mL  10 mL Intracatheter PRN Curt Bears, MD        SURGICAL HISTORY:  Past Surgical History:  Procedure Laterality Date  . ABDOMINAL HYSTERECTOMY    . IR IMAGING GUIDED PORT INSERTION  08/02/2018  . LUMBAR DISC SURGERY    . VIDEO BRONCHOSCOPY Bilateral 07/10/2018   Procedure: VIDEO BRONCHOSCOPY WITHOUT FLUORO;  Surgeon: Laurin Coder, MD;  Location: [facility];  Service: Cardiopulmonary;  Laterality: Bilateral;    REVIEW OF SYSTEMS:   Review of Systems  Constitutional: Negative for appetite change, chills, fatigue, fever  and unexpected weight change.  HENT:   Negative for mouth sores, nosebleeds, sore throat and trouble swallowing.   Eyes: Negative for eye problems and icterus.  Respiratory: Negative for cough, hemoptysis, shortness of breath and wheezing.   Cardiovascular: Negative for chest pain and leg swelling.  Gastrointestinal: Negative for abdominal pain, constipation, diarrhea, nausea and vomiting.  Genitourinary: Negative for bladder incontinence, difficulty urinating, dysuria, frequency and hematuria.   Musculoskeletal: Negative for back pain, gait problem, neck pain and neck stiffness.  Skin: Negative for itching and rash.  Neurological: Negative for dizziness, extremity weakness, gait problem, headaches, light-headedness and seizures.  Hematological: Negative for adenopathy. Does not bruise/bleed easily.  Psychiatric/Behavioral: Negative for confusion, depression and sleep disturbance. The patient is not nervous/anxious.     PHYSICAL EXAMINATION:  Blood pressure 126/70, pulse 77, temperature 97.8 F (36.6 C), temperature source Oral, resp. rate 18, height 5\' 5"  (1.651 m), weight 181 lb 12.8 oz (82.5 kg), SpO2 97 %.  ECOG PERFORMANCE STATUS: 1 - Symptomatic but completely ambulatory  Physical Exam  Constitutional: Oriented to person, place, and time and well-developed, well-nourished, and in  no distress.  HENT:  Head: Normocephalic and atraumatic.  Mouth/Throat: Oropharynx is clear and moist. No oropharyngeal exudate.  Eyes: Conjunctivae are normal. Right eye exhibits no discharge. Left eye exhibits no discharge. No scleral icterus.  Neck: Normal range of motion. Neck supple.  Cardiovascular: Normal rate, regular rhythm, normal heart sounds and intact distal pulses.   Pulmonary/Chest: Crackles noted bilaterally. Effort normal and breath sounds normal. No respiratory distress. No wheezes.  Abdominal: Soft. Bowel sounds are normal. Exhibits no distension and no mass. There is no tenderness.   Musculoskeletal: Normal range of motion. Exhibits no edema.  Lymphadenopathy:    No cervical adenopathy.  Neurological: Alert and oriented to person, place, and time. Exhibits normal muscle tone. Gait normal. Coordination normal.  Skin: Skin is warm and dry. No rash noted. Not diaphoretic. No erythema. No pallor.  Psychiatric: Mood, memory and judgment normal.  Vitals reviewed.  LABORATORY DATA: Lab Results  Component Value Date   WBC 2.3 (L) 06/18/2019   HGB 9.4 (L) 06/18/2019   HCT 26.0 (L) 06/18/2019   MCV 98.5 06/18/2019   PLT 86 (L) 06/18/2019      Chemistry      Component Value Date/Time   NA 126 (L) 06/18/2019 0812   NA 124 (L) 07/16/2018 0853   K 3.1 (L) 06/18/2019 0812   CL 90 (L) 06/18/2019 0812   CO2 26 06/18/2019 0812   BUN 6 (L) 06/18/2019 0812   BUN 8 07/16/2018 0853   CREATININE 0.66 06/18/2019 0812   CREATININE 0.68 02/13/2013 1523      Component Value Date/Time   CALCIUM 9.1 06/18/2019 0812   ALKPHOS 169 (H) 06/18/2019 0812   AST 31 06/18/2019 0812   ALT 21 06/18/2019 0812   BILITOT 0.4 06/18/2019 0812       RADIOGRAPHIC STUDIES:  Ct Chest W Contrast  Result Date: 05/29/2019 CLINICAL DATA:  63 year old female for restaging of metastatic small cell LEFT lung cancer. Patient is currently on chemotherapy. Completed immunotherapy and radiation therapy. EXAM: CT CHEST, ABDOMEN, AND PELVIS WITH CONTRAST TECHNIQUE: Multidetector CT imaging of the chest, abdomen and pelvis was performed following the standard protocol during bolus administration of intravenous contrast. CONTRAST:  120mL OMNIPAQUE IOHEXOL 300 MG/ML  SOLN COMPARISON:  04/04/2019 and prior CTs FINDINGS: CT CHEST FINDINGS Cardiovascular: Heart size is normal. Coronary artery and aortic atherosclerotic calcifications again identified without thoracic aortic aneurysm or pericardial effusion. A RIGHT Port-A-Cath is again identified with tip in the RIGHT atrium. Mediastinum/Nodes: No enlarged lymph  nodes are identified. No mediastinal mass noted. The visualized esophagus is unremarkable. Lungs/Pleura: The central LEFT hilar/lung mass has decreased in size with measurable soft tissue now 1 x 1 cm (series 2: Image 29), previously 3.5 x 2 cm on 04/04/2019. No new pulmonary mass or nodules are identified. No airspace disease, consolidation, pleural effusion or pneumothorax. Emphysema again noted. Minimal subsegmental atelectasis/scarring in the lung bases again identified. Musculoskeletal: No acute or suspicious bony abnormalities are noted. CT ABDOMEN PELVIS FINDINGS Hepatobiliary: Morphologic changes of cirrhosis again noted. Scattered ill-defined hypodense lesions within the liver do not appear significantly changed with index lesions as follows: A 1.3 cm LEFT LATERAL hepatic lobe lesion (2:59) A 1.3 cm central RIGHT hepatic lesion (2:50). No new hepatic lesions are noted. The gallbladder is unremarkable.  No biliary dilatation. Pancreas: Unremarkable Spleen: Unremarkable Adrenals/Urinary Tract: The kidneys, adrenal glands and bladder are unremarkable. Stomach/Bowel: Stomach is within normal limits. Appendix appears normal. No evidence of bowel wall thickening, distention, or  inflammatory changes. Vascular/Lymphatic: UPPER abdominal, retroperitoneal and periportal lymph nodes are smaller since the prior study, now normal in size. An index periportal node now measures 1.2 cm in short axis (2:63), previously 2 cm. No new or enlarging lymph nodes are identified. Aortic atherosclerotic calcifications noted without aneurysm. Reproductive: Status post hysterectomy. No adnexal masses. Other: No ascites, focal collection or pneumoperitoneum. Musculoskeletal: No acute or suspicious bony abnormalities. 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 IMPRESSION: 1. Treatment response with decreased size of LEFT hilar/perihilar mass and enlarged UPPER abdominal/retroperitoneal lymph nodes. 2. Multiple hepatic  lesions without significant change. 3. Cirrhosis 4. Coronary artery disease 5. Aortic Atherosclerosis (ICD10-I70.0) and Emphysema (ICD10-J43.9). Electronically Signed   By: Margarette Canada M.D.   On: 05/29/2019 09:15   Ct Abdomen Pelvis W Contrast  Result Date: 05/29/2019 CLINICAL DATA:  63 year old female for restaging of metastatic small cell LEFT lung cancer. Patient is currently on chemotherapy. Completed immunotherapy and radiation therapy. EXAM: CT CHEST, ABDOMEN, AND PELVIS WITH CONTRAST TECHNIQUE: Multidetector CT imaging of the chest, abdomen and pelvis was performed following the standard protocol during bolus administration of intravenous contrast. CONTRAST:  146mL OMNIPAQUE IOHEXOL 300 MG/ML  SOLN COMPARISON:  04/04/2019 and prior CTs FINDINGS: CT CHEST FINDINGS Cardiovascular: Heart size is normal. Coronary artery and aortic atherosclerotic calcifications again identified without thoracic aortic aneurysm or pericardial effusion. A RIGHT Port-A-Cath is again identified with tip in the RIGHT atrium. Mediastinum/Nodes: No enlarged lymph nodes are identified. No mediastinal mass noted. The visualized esophagus is unremarkable. Lungs/Pleura: The central LEFT hilar/lung mass has decreased in size with measurable soft tissue now 1 x 1 cm (series 2: Image 29), previously 3.5 x 2 cm on 04/04/2019. No new pulmonary mass or nodules are identified. No airspace disease, consolidation, pleural effusion or pneumothorax. Emphysema again noted. Minimal subsegmental atelectasis/scarring in the lung bases again identified. Musculoskeletal: No acute or suspicious bony abnormalities are noted. CT ABDOMEN PELVIS FINDINGS Hepatobiliary: Morphologic changes of cirrhosis again noted. Scattered ill-defined hypodense lesions within the liver do not appear significantly changed with index lesions as follows: A 1.3 cm LEFT LATERAL hepatic lobe lesion (2:59) A 1.3 cm central RIGHT hepatic lesion (2:50). No new hepatic lesions are  noted. The gallbladder is unremarkable.  No biliary dilatation. Pancreas: Unremarkable Spleen: Unremarkable Adrenals/Urinary Tract: The kidneys, adrenal glands and bladder are unremarkable. Stomach/Bowel: Stomach is within normal limits. Appendix appears normal. No evidence of bowel wall thickening, distention, or inflammatory changes. Vascular/Lymphatic: UPPER abdominal, retroperitoneal and periportal lymph nodes are smaller since the prior study, now normal in size. An index periportal node now measures 1.2 cm in short axis (2:63), previously 2 cm. No new or enlarging lymph nodes are identified. Aortic atherosclerotic calcifications noted without aneurysm. Reproductive: Status post hysterectomy. No adnexal masses. Other: No ascites, focal collection or pneumoperitoneum. Musculoskeletal: No acute or suspicious bony abnormalities. 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 IMPRESSION: 1. Treatment response with decreased size of LEFT hilar/perihilar mass and enlarged UPPER abdominal/retroperitoneal lymph nodes. 2. Multiple hepatic lesions without significant change. 3. Cirrhosis 4. Coronary artery disease 5. Aortic Atherosclerosis (ICD10-I70.0) and Emphysema (ICD10-J43.9). Electronically Signed   By: Margarette Canada M.D.   On: 05/29/2019 09:15     ASSESSMENT/PLAN:  This is a very pleasant 63 year old Caucasian female diagnosed with extensive stage small cell lung cancer. She presented with a large left perihilar mass as well as mediastinal lymphadenopathy as well as metastatic disease to the liver and bone. She wasdiagnosed  in August 2019.  The patient was previously treated with systemic chemotherapy with carboplatin, etoposide, and Tecentriq. She is status post 6 cycles. She then was treated withsingle agent maintenance Tecentriq for 4 cycles. This was discontinued due to evidence of disease progression.  She underwent palliative radiotherapy to the progressive disease involving the  lung and mediastinum.  She is currently undergoing treatment with cisplatin at a reduced dose of 30 mg/m andirinotecan65 mg/m on days 1 and 8 every 3 weeks. She is status post2cycles. She tolerated it well except for fatigue, electrolyte disturbances, and pancytopenia requiring granixand dose delays. Her dose will be reduced to 25 mg/m2 of Cisplatin and 50 mg/m2 of irinotecan starting from today with cycle #3.  The patient was seen with Dr. Julien Nordmann today. Labs were reviewed. Starting from cycle #3, the patient will receive Onpro/neulasta on day 8 of every cycle.   We will see her back for a follow up visit in 3 weeks for evaluation before starting cycle #4.   For her electrolyte disturbances, she will continue to take demeclocycline 150 mg BID for hyponatremia and restrict her free water intake. She will receive a total of 40 meq of KCl in her fluids today for her hypokalemia.   Regarding the patient's thrombocytopenia, the patient was urged to please seek medical evaluation if she develops signs and symptoms of bleeding. She expressed understanding. She denies any bleeding symptoms.   The patient was advised to call immediately if she has any concerning symptoms in the interval. The patient voices understanding of current disease status and treatment options and is in agreement with the current care plan. All questions were answered. The patient knows to call the clinic with any problems, questions or concerns. We can certainly see the patient much sooner if necessary   No orders of the defined types were placed in this encounter.    Cassandra L Heilingoetter, PA-C 06/18/19   ADDENDUM: Hematology/Oncology Attending: I had a face-to-face encounter with the patient today.  I recommended her care plan.  This is a very pleasant 63 years old white female with extensive stage small cell lung cancer who is currently undergoing second line treatment with reduced dose cisplatin and  irinotecan status post 2 cycles with partial response.  The start of cycle #3 was delayed by several weeks secondary to pancytopenia. The patient is feeling much better today and her absolute neutrophil count is appropriate to resume her treatment.  We will proceed with day 1 of cycle #3 today and starting from the cycle she will receive Onpro as a supportive care with day 8 of her treatment. For the hypokalemia, we will give the patient supplement of potassium chloride. For the hyponatremia she will continue her current treatment with demeclocycline. The patient will come back for follow-up visit in 3 weeks for evaluation before starting cycle #4. She was advised to call immediately if she has any concerning symptoms in the interval.  Disclaimer: This note was dictated with voice recognition software. Similar sounding words can inadvertently be transcribed and may be missed upon review. Eilleen Kempf, MD 06/18/19

## 2019-06-18 NOTE — Progress Notes (Addendum)
Increase K in hydration fluid to 30mEq OK per [place], [place]

## 2019-06-18 NOTE — Patient Instructions (Signed)
[facility] Discharge Instructions for Patients Receiving Chemotherapy  Today you received the following chemotherapy agents Irinotecan (CAMPTOSAR) & Cisplatin (PLATINOL).  To help prevent nausea and vomiting after your treatment, we encourage you to take your nausea medication as prescribed.   If you develop nausea and vomiting that is not controlled by your nausea medication, call the clinic.   BELOW ARE SYMPTOMS THAT SHOULD BE REPORTED IMMEDIATELY:  *FEVER GREATER THAN 100.5 F  *CHILLS WITH OR WITHOUT FEVER  NAUSEA AND VOMITING THAT IS NOT CONTROLLED WITH YOUR NAUSEA MEDICATION  *UNUSUAL SHORTNESS OF BREATH  *UNUSUAL BRUISING OR BLEEDING  TENDERNESS IN MOUTH AND THROAT WITH OR WITHOUT PRESENCE OF ULCERS  *URINARY PROBLEMS  *BOWEL PROBLEMS  UNUSUAL RASH Items with * indicate a potential emergency and should be followed up as soon as possible.  Feel free to call the clinic should you have any questions or concerns. The clinic phone number is [phone].  Please show the [facility] at check-in to the Emergency Department and triage nurse.  Coronavirus (COVID-19) Are you at risk?  Are you at risk for the Coronavirus (COVID-19)?  To be considered HIGH RISK for Coronavirus (COVID-19), you have to meet the following criteria:  . Traveled to [place], [place], [place], [place] or [place]; or in the [place] to [place], [place], [place], or [place]; and have fever, cough, and shortness of breath within the last 2 weeks of travel OR . Been in close contact with a person diagnosed with COVID-19 within the last 2 weeks and have fever, cough, and shortness of breath . IF YOU DO NOT MEET THESE CRITERIA, YOU ARE CONSIDERED LOW RISK FOR COVID-19.  What to do if you are HIGH RISK for COVID-19?  Marland Kitchen If you are having a medical emergency, call 911. . Seek medical care right away. Before you go to a doctor's office, urgent care or emergency department,  call ahead and tell them about your recent travel, contact with someone diagnosed with COVID-19, and your symptoms. You should receive instructions from your physician's office regarding next steps of care.  . When you arrive at healthcare provider, tell the healthcare staff immediately you have returned from visiting [place], [place], [place], [place] or [place]; or traveled in the [place] to [place], [place], [place], or [place]; in the last two weeks or you have been in close contact with a person diagnosed with COVID-19 in the last 2 weeks.   . Tell the health care staff about your symptoms: fever, cough and shortness of breath. . After you have been seen by a medical provider, you will be either: o Tested for (COVID-19) and discharged home on quarantine except to seek medical care if symptoms worsen, and asked to  - Stay home and avoid contact with others until you get your results (4-5 days)  - Avoid travel on public transportation if possible (such as bus, train, or airplane) or o Sent to the Emergency Department by EMS for evaluation, COVID-19 testing, and possible admission depending on your condition and test results.  What to do if you are LOW RISK for COVID-19?  Reduce your risk of any infection by using the same precautions used for avoiding the common cold or flu:  Marland Kitchen Wash your hands often with soap and warm water for at least 20 seconds.  If soap and water are not readily available, use an alcohol-based hand sanitizer with at least 60% alcohol.  Marland Kitchen  If coughing or sneezing, cover your mouth and nose by coughing or sneezing into the elbow areas of your shirt or coat, into a tissue or into your sleeve (not your hands). . Avoid shaking hands with others and consider head nods or verbal greetings only. . Avoid touching your eyes, nose, or mouth with unwashed hands.  . Avoid close contact with people who are sick. . Avoid places or events with large numbers of people in one  location, like concerts or sporting events. . Carefully consider travel plans you have or are making. . If you are planning any travel outside or inside the [place], visit the CDC's Travelers' Health webpage for the latest health notices. . If you have some symptoms but not all symptoms, continue to monitor at home and seek medical attention if your symptoms worsen. . If you are having a medical emergency, call 911.   [place] / e-Visit: [email or web address]         [facility]: [facility]: [phone]                   [facility]: [phone]

## 2019-06-19 DIAGNOSIS — J189 Pneumonia, unspecified organism: Secondary | ICD-10-CM | POA: Diagnosis not present

## 2019-06-19 DIAGNOSIS — C3492 Malignant neoplasm of unspecified part of left bronchus or lung: Secondary | ICD-10-CM | POA: Diagnosis not present

## 2019-06-25 ENCOUNTER — Inpatient Hospital Stay: Payer: Medicare Other

## 2019-06-25 ENCOUNTER — Inpatient Hospital Stay: Payer: Medicare Other | Attending: Internal Medicine

## 2019-06-25 ENCOUNTER — Other Ambulatory Visit: Payer: Self-pay

## 2019-06-25 ENCOUNTER — Other Ambulatory Visit: Payer: Self-pay | Admitting: Internal Medicine

## 2019-06-25 VITALS — BP 144/71 | HR 73 | Temp 98.0°F | Resp 16

## 2019-06-25 DIAGNOSIS — C787 Secondary malignant neoplasm of liver and intrahepatic bile duct: Secondary | ICD-10-CM | POA: Insufficient documentation

## 2019-06-25 DIAGNOSIS — E039 Hypothyroidism, unspecified: Secondary | ICD-10-CM | POA: Insufficient documentation

## 2019-06-25 DIAGNOSIS — Z95828 Presence of other vascular implants and grafts: Secondary | ICD-10-CM

## 2019-06-25 DIAGNOSIS — C7951 Secondary malignant neoplasm of bone: Secondary | ICD-10-CM | POA: Insufficient documentation

## 2019-06-25 DIAGNOSIS — E876 Hypokalemia: Secondary | ICD-10-CM | POA: Diagnosis not present

## 2019-06-25 DIAGNOSIS — C3492 Malignant neoplasm of unspecified part of left bronchus or lung: Secondary | ICD-10-CM | POA: Diagnosis not present

## 2019-06-25 DIAGNOSIS — E871 Hypo-osmolality and hyponatremia: Secondary | ICD-10-CM | POA: Diagnosis not present

## 2019-06-25 DIAGNOSIS — Z79899 Other long term (current) drug therapy: Secondary | ICD-10-CM | POA: Insufficient documentation

## 2019-06-25 DIAGNOSIS — D696 Thrombocytopenia, unspecified: Secondary | ICD-10-CM | POA: Insufficient documentation

## 2019-06-25 DIAGNOSIS — D61818 Other pancytopenia: Secondary | ICD-10-CM | POA: Insufficient documentation

## 2019-06-25 DIAGNOSIS — Z923 Personal history of irradiation: Secondary | ICD-10-CM | POA: Diagnosis not present

## 2019-06-25 LAB — CBC WITH DIFFERENTIAL (CANCER CENTER ONLY)
Abs Immature Granulocytes: 0.01 10*3/uL (ref 0.00–0.07)
Basophils Absolute: 0 10*3/uL (ref 0.0–0.1)
Basophils Relative: 1 %
Eosinophils Absolute: 0.1 10*3/uL (ref 0.0–0.5)
Eosinophils Relative: 3 %
HCT: 24.1 % — ABNORMAL LOW (ref 36.0–46.0)
Hemoglobin: 8.9 g/dL — ABNORMAL LOW (ref 12.0–15.0)
Immature Granulocytes: 1 %
Lymphocytes Relative: 25 %
Lymphs Abs: 0.6 10*3/uL — ABNORMAL LOW (ref 0.7–4.0)
MCH: 36.9 pg — ABNORMAL HIGH (ref 26.0–34.0)
MCHC: 36.9 g/dL — ABNORMAL HIGH (ref 30.0–36.0)
MCV: 100 fL (ref 80.0–100.0)
Monocytes Absolute: 0.4 10*3/uL (ref 0.1–1.0)
Monocytes Relative: 16 %
Neutro Abs: 1.2 10*3/uL — ABNORMAL LOW (ref 1.7–7.7)
Neutrophils Relative %: 54 %
Platelet Count: 48 10*3/uL — ABNORMAL LOW (ref 150–400)
RBC: 2.41 MIL/uL — ABNORMAL LOW (ref 3.87–5.11)
RDW: 16.4 % — ABNORMAL HIGH (ref 11.5–15.5)
WBC Count: 2.2 10*3/uL — ABNORMAL LOW (ref 4.0–10.5)
nRBC: 0 % (ref 0.0–0.2)

## 2019-06-25 LAB — CMP (CANCER CENTER ONLY)
ALT: 22 U/L (ref 0–44)
AST: 32 U/L (ref 15–41)
Albumin: 3.7 g/dL (ref 3.5–5.0)
Alkaline Phosphatase: 154 U/L — ABNORMAL HIGH (ref 38–126)
Anion gap: 11 (ref 5–15)
BUN: 6 mg/dL — ABNORMAL LOW (ref 8–23)
CO2: 27 mmol/L (ref 22–32)
Calcium: 9 mg/dL (ref 8.9–10.3)
Chloride: 89 mmol/L — ABNORMAL LOW (ref 98–111)
Creatinine: 0.63 mg/dL (ref 0.44–1.00)
GFR, Est AFR Am: >60 mL/min (ref >60)
GFR, Estimated: >60 mL/min (ref >60)
Glucose, Bld: 101 mg/dL — ABNORMAL HIGH (ref 70–99)
Potassium: 3.3 mmol/L — ABNORMAL LOW (ref 3.5–5.1)
Sodium: 127 mmol/L — ABNORMAL LOW (ref 135–145)
Total Bilirubin: 0.5 mg/dL (ref 0.3–1.2)
Total Protein: 7 g/dL (ref 6.5–8.1)

## 2019-06-25 LAB — MAGNESIUM: Magnesium: 1.6 mg/dL — ABNORMAL LOW (ref 1.7–2.4)

## 2019-06-25 MED ORDER — SODIUM CHLORIDE 0.9 % IV SOLN
2.0000 g | Freq: Once | INTRAVENOUS | Status: AC
  Administered 2019-06-25: 2 g via INTRAVENOUS
  Filled 2019-06-25: qty 4

## 2019-06-25 MED ORDER — POTASSIUM CHLORIDE 10 MEQ/100ML IV SOLN
10.0000 meq | INTRAVENOUS | Status: AC
  Administered 2019-06-25 (×2): 10 meq via INTRAVENOUS
  Filled 2019-06-25: qty 100

## 2019-06-25 MED ORDER — SODIUM CHLORIDE 0.9% FLUSH
10.0000 mL | INTRAVENOUS | Status: DC | PRN
  Administered 2019-06-25: 12:00:00 10 mL
  Filled 2019-06-25: qty 10

## 2019-06-25 MED ORDER — SODIUM CHLORIDE 0.9 % IV SOLN
Freq: Once | INTRAVENOUS | Status: AC
  Administered 2019-06-25: 09:00:00 via INTRAVENOUS
  Filled 2019-06-25: qty 250

## 2019-06-25 MED ORDER — SODIUM CHLORIDE 0.9% FLUSH
10.0000 mL | INTRAVENOUS | Status: DC | PRN
  Administered 2019-06-25: 08:00:00 10 mL
  Filled 2019-06-25: qty 10

## 2019-06-25 MED ORDER — SODIUM CHLORIDE 0.9 % IV SOLN
2.0000 g | Freq: Once | INTRAVENOUS | Status: DC
  Filled 2019-06-25: qty 4

## 2019-06-25 MED ORDER — HEPARIN SOD (PORK) LOCK FLUSH 100 UNIT/ML IV SOLN
500.0000 [IU] | Freq: Once | INTRAVENOUS | Status: AC | PRN
  Administered 2019-06-25: 500 [IU]
  Filled 2019-06-25: qty 5

## 2019-06-25 NOTE — Progress Notes (Signed)
Per Dr. Julien Nordmann, no tx today. 20 mEq K+ and 2mg  Mag IV instead. Patient agreeable.

## 2019-06-25 NOTE — Patient Instructions (Signed)
Hypokalemia Hypokalemia means that the amount of potassium in the blood is lower than normal. Potassium is a chemical (electrolyte) that helps regulate the amount of fluid in the body. It also stimulates muscle tightening (contraction) and helps nerves work properly. Normally, most of the body's potassium is inside cells, and only a very small amount is in the blood. Because the amount in the blood is so small, minor changes to potassium levels in the blood can be life-threatening. What are the causes? This condition may be caused by:  Antibiotic medicine.  Diarrhea or vomiting. Taking too much of a medicine that helps you have a bowel movement (laxative) can cause diarrhea and lead to hypokalemia.  Chronic kidney disease (CKD).  Medicines that help the body get rid of excess fluid (diuretics).  Eating disorders, such as bulimia.  Low magnesium levels in the body.  Sweating a lot. What are the signs or symptoms? Symptoms of this condition include:  Weakness.  Constipation.  Fatigue.  Muscle cramps.  Mental confusion.  Skipped heartbeats or irregular heartbeat (palpitations).  Tingling or numbness. How is this diagnosed? This condition is diagnosed with a blood test. How is this treated? This condition may be treated by:  Taking potassium supplements by mouth.  Adjusting the medicines that you take.  Eating more foods that contain a lot of potassium. If your potassium level is very low, you may need to get potassium through an IV and be monitored in the hospital. Follow these instructions at home:   Take over-the-counter and prescription medicines only as told by your health care provider. This includes vitamins and supplements.  Eat a healthy diet. A healthy diet includes fresh fruits and vegetables, whole grains, healthy fats, and lean proteins.  If instructed, eat more foods that contain a lot of potassium. This includes: ? Nuts, such as peanuts and pistachios.  ? Seeds, such as sunflower seeds and pumpkin seeds. ? Peas, lentils, and lima beans. ? Whole grain and bran cereals and breads. ? Fresh fruits and vegetables, such as apricots, avocado, bananas, cantaloupe, kiwi, oranges, tomatoes, asparagus, and potatoes. ? Orange juice. ? Tomato juice. ? Red meats. ? Yogurt.  Keep all follow-up visits as told by your health care provider. This is important. Contact a health care provider if you:  Have weakness that gets worse.  Feel your heart pounding or racing.  Vomit.  Have diarrhea.  Have diabetes (diabetes mellitus) and you have trouble keeping your blood sugar (glucose) in your target range. Get help right away if you:  Have chest pain.  Have shortness of breath.  Have vomiting or diarrhea that lasts for more than 2 days.  Faint. Summary  Hypokalemia means that the amount of potassium in the blood is lower than normal.  This condition is diagnosed with a blood test.  Hypokalemia may be treated by taking potassium supplements, adjusting the medicines that you take, or eating more foods that are high in potassium.  If your potassium level is very low, you may need to get potassium through an IV and be monitored in the hospital. This information is not intended to replace advice given to you by your health care provider. Make sure you discuss any questions you have with your health care provider. Document Released: 11/06/2005 Document Revised: 06/19/2018 Document Reviewed: 06/19/2018 Elsevier Patient Education  [place].  Hypomagnesemia Hypomagnesemia is a condition in which the level of magnesium in the blood is low. Magnesium is a mineral that is found in  many foods. It is used in many different processes in the body. Hypomagnesemia can affect every organ in the body. In severe cases, it can cause life-threatening problems. What are the causes? This condition may be caused by:  Not getting enough magnesium in your  diet.  Malnutrition.  Problems with absorbing magnesium from the intestines.  Dehydration.  Alcohol abuse.  Vomiting.  Severe or chronic diarrhea.  Some medicines, including medicines that make you urinate more (diuretics).  Certain diseases, such as kidney disease, diabetes, celiac disease, and overactive thyroid. What are the signs or symptoms? Symptoms of this condition include:  Loss of appetite.  Nausea and vomiting.  Involuntary shaking or trembling of a body part (tremor).  Muscle weakness.  Tingling in the arms and legs.  Sudden tightening of muscles (muscle spasms).  Confusion.  Psychiatric issues, such as depression, irritability, or psychosis.  A feeling of fluttering of the heart.  Seizures. These symptoms are more severe if magnesium levels drop suddenly. How is this diagnosed? This condition may be diagnosed based on:  Your symptoms and medical history.  A physical exam.  Blood and urine tests. How is this treated? Treatment depends on the cause and the severity of the condition. It may be treated with:  A magnesium supplement. This can be taken in pill form. If the condition is severe, magnesium is usually given through an IV.  Changes to your diet. You may be directed to eat foods that have a lot of magnesium, such as green leafy vegetables, peas, beans, and nuts.  Stopping any intake of alcohol. Follow these instructions at home:      Make sure that your diet includes foods with magnesium. Foods that have a lot of magnesium in them include: ? Green leafy vegetables, such as spinach and broccoli. ? Beans and peas. ? Nuts and seeds, such as almonds and sunflower seeds. ? Whole grains, such as whole grain bread and fortified cereals.  Take magnesium supplements if your health care provider tells you to do that. Take them as directed.  Take over-the-counter and prescription medicines only as told by your health care provider.  Have  your magnesium levels monitored as told by your health care provider.  When you are active, drink fluids that contain electrolytes.  Avoid drinking alcohol.  Keep all follow-up visits as told by your health care provider. This is important. Contact a health care provider if:  You get worse instead of better.  Your symptoms return. Get help right away if you:  Develop severe muscle weakness.  Have trouble breathing.  Feel that your heart is racing. Summary  Hypomagnesemia is a condition in which the level of magnesium in the blood is low.  Hypomagnesemia can affect every organ in the body.  Treatment may include eating more foods that contain magnesium, taking magnesium supplements, and not drinking alcohol.  Have your magnesium levels monitored as told by your health care provider. This information is not intended to replace advice given to you by your health care provider. Make sure you discuss any questions you have with your health care provider. Document Released: 08/02/2005 Document Revised: 10/19/2017 Document Reviewed: 10/08/2017 Elsevier Patient Education  2020 [employer].

## 2019-06-26 ENCOUNTER — Other Ambulatory Visit: Payer: Medicare Other

## 2019-06-26 ENCOUNTER — Ambulatory Visit: Payer: Medicare Other | Admitting: Physician Assistant

## 2019-06-26 ENCOUNTER — Ambulatory Visit: Payer: Medicare Other

## 2019-07-02 ENCOUNTER — Ambulatory Visit: Payer: Medicare Other | Admitting: Physician Assistant

## 2019-07-02 ENCOUNTER — Ambulatory Visit: Payer: Medicare Other

## 2019-07-02 ENCOUNTER — Other Ambulatory Visit: Payer: Medicare Other

## 2019-07-05 ENCOUNTER — Other Ambulatory Visit: Payer: Self-pay | Admitting: Internal Medicine

## 2019-07-07 NOTE — Progress Notes (Signed)
Confirmed w/ Cassie H, PA - C3D8 was cancelled.  C4D1 will begin 07/09/19. Kennith Center, Pharm.D., CPP 07/07/2019@10 :32 AM

## 2019-07-09 ENCOUNTER — Inpatient Hospital Stay: Payer: Medicare Other

## 2019-07-09 ENCOUNTER — Telehealth: Payer: Self-pay | Admitting: Internal Medicine

## 2019-07-09 ENCOUNTER — Other Ambulatory Visit: Payer: Self-pay | Admitting: Internal Medicine

## 2019-07-09 ENCOUNTER — Encounter: Payer: Self-pay | Admitting: Internal Medicine

## 2019-07-09 ENCOUNTER — Inpatient Hospital Stay (HOSPITAL_BASED_OUTPATIENT_CLINIC_OR_DEPARTMENT_OTHER): Payer: Medicare Other | Admitting: Internal Medicine

## 2019-07-09 ENCOUNTER — Other Ambulatory Visit: Payer: Self-pay

## 2019-07-09 VITALS — BP 137/74 | HR 77 | Temp 98.7°F | Resp 18 | Ht 65.0 in | Wt 178.4 lb

## 2019-07-09 DIAGNOSIS — Z95828 Presence of other vascular implants and grafts: Secondary | ICD-10-CM

## 2019-07-09 DIAGNOSIS — E876 Hypokalemia: Secondary | ICD-10-CM

## 2019-07-09 DIAGNOSIS — D61818 Other pancytopenia: Secondary | ICD-10-CM | POA: Diagnosis not present

## 2019-07-09 DIAGNOSIS — D696 Thrombocytopenia, unspecified: Secondary | ICD-10-CM | POA: Diagnosis not present

## 2019-07-09 DIAGNOSIS — I10 Essential (primary) hypertension: Secondary | ICD-10-CM | POA: Diagnosis not present

## 2019-07-09 DIAGNOSIS — C3492 Malignant neoplasm of unspecified part of left bronchus or lung: Secondary | ICD-10-CM | POA: Diagnosis not present

## 2019-07-09 DIAGNOSIS — C787 Secondary malignant neoplasm of liver and intrahepatic bile duct: Secondary | ICD-10-CM | POA: Diagnosis not present

## 2019-07-09 DIAGNOSIS — Z5111 Encounter for antineoplastic chemotherapy: Secondary | ICD-10-CM

## 2019-07-09 DIAGNOSIS — Z923 Personal history of irradiation: Secondary | ICD-10-CM | POA: Diagnosis not present

## 2019-07-09 DIAGNOSIS — Z79899 Other long term (current) drug therapy: Secondary | ICD-10-CM | POA: Diagnosis not present

## 2019-07-09 DIAGNOSIS — C7951 Secondary malignant neoplasm of bone: Secondary | ICD-10-CM | POA: Diagnosis not present

## 2019-07-09 DIAGNOSIS — E871 Hypo-osmolality and hyponatremia: Secondary | ICD-10-CM | POA: Diagnosis not present

## 2019-07-09 DIAGNOSIS — E039 Hypothyroidism, unspecified: Secondary | ICD-10-CM | POA: Diagnosis not present

## 2019-07-09 LAB — CBC WITH DIFFERENTIAL (CANCER CENTER ONLY)
Abs Immature Granulocytes: 0 10*3/uL (ref 0.00–0.07)
Basophils Absolute: 0 10*3/uL (ref 0.0–0.1)
Basophils Relative: 1 %
Eosinophils Absolute: 0 10*3/uL (ref 0.0–0.5)
Eosinophils Relative: 2 %
HCT: 25.6 % — ABNORMAL LOW (ref 36.0–46.0)
Hemoglobin: 9.3 g/dL — ABNORMAL LOW (ref 12.0–15.0)
Immature Granulocytes: 0 %
Lymphocytes Relative: 29 %
Lymphs Abs: 0.6 10*3/uL — ABNORMAL LOW (ref 0.7–4.0)
MCH: 36.8 pg — ABNORMAL HIGH (ref 26.0–34.0)
MCHC: 36.3 g/dL — ABNORMAL HIGH (ref 30.0–36.0)
MCV: 101.2 fL — ABNORMAL HIGH (ref 80.0–100.0)
Monocytes Absolute: 0.5 10*3/uL (ref 0.1–1.0)
Monocytes Relative: 21 %
Neutro Abs: 1 10*3/uL — ABNORMAL LOW (ref 1.7–7.7)
Neutrophils Relative %: 47 %
Platelet Count: 67 10*3/uL — ABNORMAL LOW (ref 150–400)
RBC: 2.53 MIL/uL — ABNORMAL LOW (ref 3.87–5.11)
RDW: 14.9 % (ref 11.5–15.5)
WBC Count: 2.1 10*3/uL — ABNORMAL LOW (ref 4.0–10.5)
nRBC: 0 % (ref 0.0–0.2)

## 2019-07-09 LAB — CMP (CANCER CENTER ONLY)
ALT: 22 U/L (ref 0–44)
AST: 40 U/L (ref 15–41)
Albumin: 3.8 g/dL (ref 3.5–5.0)
Alkaline Phosphatase: 148 U/L — ABNORMAL HIGH (ref 38–126)
Anion gap: 10 (ref 5–15)
BUN: 4 mg/dL — ABNORMAL LOW (ref 8–23)
CO2: 26 mmol/L (ref 22–32)
Calcium: 8.9 mg/dL (ref 8.9–10.3)
Chloride: 84 mmol/L — ABNORMAL LOW (ref 98–111)
Creatinine: 0.61 mg/dL (ref 0.44–1.00)
GFR, Est AFR Am: >60 mL/min (ref >60)
GFR, Estimated: >60 mL/min (ref >60)
Glucose, Bld: 102 mg/dL — ABNORMAL HIGH (ref 70–99)
Potassium: 2.8 mmol/L — CL (ref 3.5–5.1)
Sodium: 120 mmol/L — ABNORMAL LOW (ref 135–145)
Total Bilirubin: 0.5 mg/dL (ref 0.3–1.2)
Total Protein: 7 g/dL (ref 6.5–8.1)

## 2019-07-09 LAB — MAGNESIUM: Magnesium: 1.5 mg/dL — ABNORMAL LOW (ref 1.7–2.4)

## 2019-07-09 MED ORDER — POTASSIUM CHLORIDE 10 MEQ/100ML IV SOLN
10.0000 meq | INTRAVENOUS | Status: AC
  Administered 2019-07-09 (×4): 10 meq via INTRAVENOUS
  Filled 2019-07-09: qty 100

## 2019-07-09 MED ORDER — HEPARIN SOD (PORK) LOCK FLUSH 100 UNIT/ML IV SOLN
500.0000 [IU] | Freq: Once | INTRAVENOUS | Status: AC | PRN
  Administered 2019-07-09: 500 [IU]
  Filled 2019-07-09: qty 5

## 2019-07-09 MED ORDER — SODIUM CHLORIDE 0.9 % IV SOLN
Freq: Once | INTRAVENOUS | Status: AC
  Administered 2019-07-09: 10:00:00 via INTRAVENOUS
  Filled 2019-07-09: qty 250

## 2019-07-09 MED ORDER — SODIUM CHLORIDE 0.9% FLUSH
10.0000 mL | INTRAVENOUS | Status: DC | PRN
  Administered 2019-07-09: 10 mL
  Filled 2019-07-09: qty 10

## 2019-07-09 MED ORDER — SODIUM CHLORIDE 0.9 % IV SOLN
2.0000 g | Freq: Once | INTRAVENOUS | Status: AC
  Administered 2019-07-09: 2 g via INTRAVENOUS
  Filled 2019-07-09: qty 4

## 2019-07-09 NOTE — Progress Notes (Unsigned)
mag

## 2019-07-09 NOTE — Progress Notes (Signed)
[facility] [phone]   [phone]  OFFICE PROGRESS NOTE  [name], MD [place] [address]  DIAGNOSIS: Extensive stage (T3, N2, M1a)  small cell lung cancer diagnosed in August 2019 and presented with large left hilar/subhilar mass in addition to mediastinal lymphadenopathy and contracture and mass in the right upper lobe.  PRIOR THERAPY:  1) Systemic therapy with carboplatin for AUC of 5 on day 1, etoposide 100 mg/M2 on days 1, 2 and 3 as well as Tecentriq (Atezolizumab) 1200 mg IV every 3 weeks with Neulasta support.  Status post 10 cycles.  Starting from cycle #7, the patient will be treated with only single agent immunotherapy with Tecentriq 1200 mg IV every 3 weeks.  Last dose was given January 27, 2019 discontinued secondary to disease progression. 2) palliative radiotherapy to the mediastinal lymph nodes.  CURRENT THERAPY: Systemic chemotherapy with reduced dose cisplatin 25 mg/M2 and irinotecan 50 mg/M2 every 3 weeks.  First dose Apr 16, 2019.  Status post 3 cycles.  INTERVAL HISTORY: [name] 63 y.o. female returns to the clinic today for follow-up visit.  The patient is feeling fine today with no concerning complaints except for mild fatigue.  She denied having any recent chest pain, shortness of breath, cough or hemoptysis.  She denied having any fever or chills.  She has no nausea, vomiting, diarrhea or constipation.  She denied having any recent weight loss or night sweats.  She has been tolerating her treatment with reduced dose cisplatin and irinotecan fairly well except for the pancytopenia.  She is here today for evaluation before starting cycle #4.  MEDICAL HISTORY: Past Medical History:  Diagnosis Date  . Alpha-1-antitrypsin deficiency ([place])   . Cervical cancer ([place]) 2004  . 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 This doesn't fit entirely with emphysema.      . Exposure to hepatitis C 09/12/2016  . Fibromyalgia   . GERD (gastroesophageal reflux disease)   . Hyperlipidemia   . IBS (irritable bowel syndrome)   . Obstructive sleep apnea 06/02/2008   NPSG 06/07/08, AHI 6.9/ hr, weight 220 lbs    . Osteoporosis   . Pulmonary fibrosis, unspecified ([place]) 09/29/2016   CXR 03/28/2016  . Rhinitis, nonallergic 05/08/2012  . 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 Vitamin D deficiency     ALLERGIES:  is allergic to penicillins; actonel [risedronate sodium]; advair diskus [fluticasone-salmeterol]; alendronate sodium; aspirin; azithromycin; keflex [cephalexin]; levofloxacin; morphine; nsaids; omnicef [cefdinir]; and erythromycin.  MEDICATIONS:  Current Outpatient Medications  Medication Sig Dispense Refill  . albuterol (PROVENTIL) (2.5 MG/3ML) 0.083% nebulizer solution Take 3 mLs (2.5 mg total) by nebulization every 6 (six) hours as needed. 90 mL 4  . allopurinol (ZYLOPRIM) 300 MG tablet Take 1 tablet (300 mg total) by mouth daily. Begin on 07/25/2018 14 tablet 0  . amitriptyline (ELAVIL) 75 MG tablet Take 75 mg by mouth at bedtime.      Marland Kitchen demeclocycline (DECLOMYCIN) 150 MG tablet TAKE  (1)  TABLET TWICE A DAY. 60 tablet 0  . ezetimibe (ZETIA) 10 MG tablet TAKE 1 TABLET ONCE A DAY 90 tablet 0  . feeding supplement, ENSURE ENLIVE, (ENSURE ENLIVE) LIQD Take 237 mLs by mouth 2 (two) times daily between meals.  60 Bottle 0  . fluticasone (FLONASE) 50 MCG/ACT nasal spray USE 2 SPRAYS IN EACH NOSTRIL ONCE DAILY. 16 g 5  . folic acid (FOLVITE) 1 MG tablet Take 1 mg by mouth daily.    . Glycopyrrolate-Formoterol (BEVESPI AEROSPHERE) 9-4.8 MCG/ACT AERO Inhale 2 puffs into the lungs 2 (two) times daily. 1 Inhaler 12  . HYDROcodone-acetaminophen (NORCO/VICODIN) 5-325 MG tablet Take 1 tablet by mouth  3 (three) times daily. 30 tablet 0  . levothyroxine (SYNTHROID) 75 MCG tablet TAKE (1) TABLET DAILY BE- FORE BREAKFAST. 30 tablet 0  . lidocaine-prilocaine (EMLA) cream Apply 1 application topically as needed. 30 g 0  . Nebulizers (COMPRESSOR NEBULIZER) MISC 1 Device by Does not apply route as needed. 1 each prn  . omeprazole (PRILOSEC) 40 MG capsule TAKE (1) CAPSULE DAILY 90 capsule 0  . potassium chloride SA (K-DUR) 20 MEQ tablet Take 1 tablet (20 mEq total) by mouth 2 (two) times daily. 14 tablet 0  . PROAIR HFA 108 (90 Base) MCG/ACT inhaler 2 PUFFS EVERY 4 HOURS AS NEEDED FOR WHEEZING 8.5 g 0  . prochlorperazine (COMPAZINE) 10 MG tablet Take 1 tablet (10 mg total) by mouth every 6 (six) hours as needed for nausea or vomiting. (Patient not taking: Reported on 06/18/2019) 30 tablet 1  . rosuvastatin (CRESTOR) 40 MG tablet Take 0.5 tablets (20 mg total) by mouth daily. 45 tablet 3   No current facility-administered medications for this visit.    Facility-Administered Medications Ordered in Other Visits  Medication Dose Route Frequency Provider Last Rate Last Dose  . sodium chloride flush (NS) 0.9 % injection 10 mL  10 mL Intracatheter PRN Curt Bears, MD   10 mL at 07/09/19 5093    SURGICAL HISTORY:  Past Surgical History:  Procedure Laterality Date  . ABDOMINAL HYSTERECTOMY    . IR IMAGING GUIDED PORT INSERTION  08/02/2018  . LUMBAR DISC SURGERY    . VIDEO BRONCHOSCOPY Bilateral 07/10/2018   Procedure: VIDEO BRONCHOSCOPY WITHOUT FLUORO;  Surgeon: Laurin Coder, MD;  Location: [facility];  Service: Cardiopulmonary;  Laterality: Bilateral;    REVIEW OF SYSTEMS:  A comprehensive review of systems was negative except for: Constitutional: positive for fatigue   PHYSICAL EXAMINATION: General appearance: alert, cooperative, fatigued and no distress Head: Normocephalic, without obvious abnormality, atraumatic Neck: no adenopathy, no JVD, supple, symmetrical, trachea midline and  thyroid not enlarged, symmetric, no tenderness/mass/nodules Lymph nodes: Cervical, supraclavicular, and axillary nodes normal. Resp: clear to auscultation bilaterally Back: symmetric, no curvature. ROM normal. No CVA tenderness. Cardio: regular rate and rhythm, S1, S2 normal, no murmur, click, rub or gallop GI: soft, non-tender; bowel sounds normal; no masses,  no organomegaly Extremities: extremities normal, atraumatic, no cyanosis or edema  ECOG PERFORMANCE STATUS: 1 - Symptomatic but completely ambulatory  Blood pressure 137/74, pulse 77, temperature 98.7 F (37.1 C), resp. rate 18, height 5\' 5"  (1.651 m), weight 178 lb 6.4 oz (80.9 kg), SpO2 98 %.  LABORATORY DATA: Lab Results  Component Value Date   WBC 2.1 (L) 07/09/2019   HGB 9.3 (L) 07/09/2019   HCT 25.6 (L) 07/09/2019   MCV 101.2 (H) 07/09/2019   PLT 67 (L) 07/09/2019      Chemistry      Component Value Date/Time   NA 127 (L) 06/25/2019 0811   NA 124 (L) 07/16/2018 0853   K 3.3 (L) 06/25/2019 0811   CL 89 (L) 06/25/2019 0811   CO2 27 06/25/2019 0811   BUN 6 (L) 06/25/2019  0811   BUN 8 07/16/2018 0853   CREATININE 0.63 06/25/2019 0811   CREATININE 0.68 02/13/2013 1523      Component Value Date/Time   CALCIUM 9.0 06/25/2019 0811   ALKPHOS 154 (H) 06/25/2019 0811   AST 32 06/25/2019 0811   ALT 22 06/25/2019 0811   BILITOT 0.5 06/25/2019 0811       RADIOGRAPHIC STUDIES: No results found.  ASSESSMENT AND PLAN: This is a very pleasant 63 years old white female recently diagnosed with extensive stage small cell lung cancer.  The patient had a recent PET scan that showed multiple metastatic disease in the lung, liver as well as bone. The patient is currently on systemic chemotherapy with carboplatin, etoposide and Tecentriq status post 6 cycles. She tolerated the induction phase of her treatment well with no concerning complaints. The patient was started on maintenance treatment with single agent Tecentriq status  post 4 cycles. She has been tolerating this treatment well with no concerning adverse effects. Her treatment was discontinued recently secondary to disease progression.  She underwent palliative radiotherapy to the progressive disease in the lung and mediastinum. The patient had evidence for disease progression especially in the liver.  She was started on treatment with reduced dose cisplatin 25 mg/M2 and irinotecan 50 mg/M2 on days 1 and 8 every 3 weeks.  Status post 3 cycles.  She has been tolerating this treatment well except for the pancytopenia. Unfortunately her CBC today showed significant thrombocytopenia and does not meet the parameter to proceed with cycle #4 today as planned. I will arrange for the patient to come back for follow-up visit next week for evaluation before resuming her treatment. For the electrolyte imbalance, we will monitor her magnesium and potassium closely and consider the patient for replacement as needed. For the hypothyroidism, I will adjust her dose and continue to monitor her TSH closely. She will come back for follow-up visit in 1 week. She was advised to call immediately if she has any concerning symptoms in the interval. The patient voices understanding of current disease status and treatment options and is in agreement with the current care plan. All questions were answered. The patient knows to call the clinic with any problems, questions or concerns. We can certainly see the patient much sooner if necessary.  Disclaimer: This note was dictated with voice recognition software. Similar sounding words can inadvertently be transcribed and may not be corrected upon review.

## 2019-07-09 NOTE — Patient Instructions (Signed)
Hypokalemia (Low potassium) Hypokalemia means that the amount of potassium in the blood is lower than normal. Potassium is a chemical (electrolyte) that helps regulate the amount of fluid in the body. It also stimulates muscle tightening (contraction) and helps nerves work properly. Normally, most of the body's potassium is inside cells, and only a very small amount is in the blood. Because the amount in the blood is so small, minor changes to potassium levels in the blood can be life-threatening. What are the causes? This condition may be caused by:  Antibiotic medicine.  Diarrhea or vomiting. Taking too much of a medicine that helps you have a bowel movement (laxative) can cause diarrhea and lead to hypokalemia.  Chronic kidney disease (CKD).  Medicines that help the body get rid of excess fluid (diuretics).  Eating disorders, such as bulimia.  Low magnesium levels in the body.  Sweating a lot. What are the signs or symptoms? Symptoms of this condition include:  Weakness.  Constipation.  Fatigue.  Muscle cramps.  Mental confusion.  Skipped heartbeats or irregular heartbeat (palpitations).  Tingling or numbness. How is this diagnosed? This condition is diagnosed with a blood test. How is this treated? This condition may be treated by:  Taking potassium supplements by mouth.  Adjusting the medicines that you take.  Eating more foods that contain a lot of potassium. If your potassium level is very low, you may need to get potassium through an IV and be monitored in the hospital. Follow these instructions at home:   Take over-the-counter and prescription medicines only as told by your health care provider. This includes vitamins and supplements.  Eat a healthy diet. A healthy diet includes fresh fruits and vegetables, whole grains, healthy fats, and lean proteins.  If instructed, eat more foods that contain a lot of potassium. This includes: ? Nuts, such as peanuts  and pistachios. ? Seeds, such as sunflower seeds and pumpkin seeds. ? Peas, lentils, and lima beans. ? Whole grain and bran cereals and breads. ? Fresh fruits and vegetables, such as apricots, avocado, bananas, cantaloupe, kiwi, oranges, tomatoes, asparagus, and potatoes. ? Orange juice. ? Tomato juice. ? Red meats. ? Yogurt.  Keep all follow-up visits as told by your health care provider. This is important. Contact a health care provider if you:  Have weakness that gets worse.  Feel your heart pounding or racing.  Vomit.  Have diarrhea.  Have diabetes (diabetes mellitus) and you have trouble keeping your blood sugar (glucose) in your target range. Get help right away if you:  Have chest pain.  Have shortness of breath.  Have vomiting or diarrhea that lasts for more than 2 days.  Faint. Summary  Hypokalemia means that the amount of potassium in the blood is lower than normal.  This condition is diagnosed with a blood test.  Hypokalemia may be treated by taking potassium supplements, adjusting the medicines that you take, or eating more foods that are high in potassium.  If your potassium level is very low, you may need to get potassium through an IV and be monitored in the hospital. This information is not intended to replace advice given to you by your health care provider. Make sure you discuss any questions you have with your health care provider. Document Released: 11/06/2005 Document Revised: 06/19/2018 Document Reviewed: 06/19/2018 Elsevier Patient Education  [place].  Hypomagnesemia Hypomagnesemia is a condition in which the level of magnesium in the blood is low. Magnesium is a mineral that is  found in many foods. It is used in many different processes in the body. Hypomagnesemia can affect every organ in the body. In severe cases, it can cause life-threatening problems. What are the causes? This condition may be caused by:  Not getting enough  magnesium in your diet.  Malnutrition.  Problems with absorbing magnesium from the intestines.  Dehydration.  Alcohol abuse.  Vomiting.  Severe or chronic diarrhea.  Some medicines, including medicines that make you urinate more (diuretics).  Certain diseases, such as kidney disease, diabetes, celiac disease, and overactive thyroid. What are the signs or symptoms? Symptoms of this condition include:  Loss of appetite.  Nausea and vomiting.  Involuntary shaking or trembling of a body part (tremor).  Muscle weakness.  Tingling in the arms and legs.  Sudden tightening of muscles (muscle spasms).  Confusion.  Psychiatric issues, such as depression, irritability, or psychosis.  A feeling of fluttering of the heart.  Seizures. These symptoms are more severe if magnesium levels drop suddenly. How is this diagnosed? This condition may be diagnosed based on:  Your symptoms and medical history.  A physical exam.  Blood and urine tests. How is this treated? Treatment depends on the cause and the severity of the condition. It may be treated with:  A magnesium supplement. This can be taken in pill form. If the condition is severe, magnesium is usually given through an IV.  Changes to your diet. You may be directed to eat foods that have a lot of magnesium, such as green leafy vegetables, peas, beans, and nuts.  Stopping any intake of alcohol. Follow these instructions at home:      Make sure that your diet includes foods with magnesium. Foods that have a lot of magnesium in them include: ? Green leafy vegetables, such as spinach and broccoli. ? Beans and peas. ? Nuts and seeds, such as almonds and sunflower seeds. ? Whole grains, such as whole grain bread and fortified cereals.  Take magnesium supplements if your health care provider tells you to do that. Take them as directed.  Take over-the-counter and prescription medicines only as told by your health care  provider.  Have your magnesium levels monitored as told by your health care provider.  When you are active, drink fluids that contain electrolytes.  Avoid drinking alcohol.  Keep all follow-up visits as told by your health care provider. This is important. Contact a health care provider if:  You get worse instead of better.  Your symptoms return. Get help right away if you:  Develop severe muscle weakness.  Have trouble breathing.  Feel that your heart is racing. Summary  Hypomagnesemia is a condition in which the level of magnesium in the blood is low.  Hypomagnesemia can affect every organ in the body.  Treatment may include eating more foods that contain magnesium, taking magnesium supplements, and not drinking alcohol.  Have your magnesium levels monitored as told by your health care provider. This information is not intended to replace advice given to you by your health care provider. Make sure you discuss any questions you have with your health care provider. Document Released: 08/02/2005 Document Revised: 10/19/2017 Document Reviewed: 10/08/2017 Elsevier Patient Education  [place].    Hypomagnesemia (Low magnesium) Hypomagnesemia is a condition in which the level of magnesium in the blood is low. Magnesium is a mineral that is found in many foods. It is used in many different processes in the body. Hypomagnesemia can affect every organ in the  body. In severe cases, it can cause life-threatening problems. What are the causes? This condition may be caused by:  Not getting enough magnesium in your diet.  Malnutrition.  Problems with absorbing magnesium from the intestines.  Dehydration.  Alcohol abuse.  Vomiting.  Severe or chronic diarrhea.  Some medicines, including medicines that make you urinate more (diuretics).  Certain diseases, such as kidney disease, diabetes, celiac disease, and overactive thyroid. What are the signs or symptoms?  Symptoms of this condition include:  Loss of appetite.  Nausea and vomiting.  Involuntary shaking or trembling of a body part (tremor).  Muscle weakness.  Tingling in the arms and legs.  Sudden tightening of muscles (muscle spasms).  Confusion.  Psychiatric issues, such as depression, irritability, or psychosis.  A feeling of fluttering of the heart.  Seizures. These symptoms are more severe if magnesium levels drop suddenly. How is this diagnosed? This condition may be diagnosed based on:  Your symptoms and medical history.  A physical exam.  Blood and urine tests. How is this treated? Treatment depends on the cause and the severity of the condition. It may be treated with:  A magnesium supplement. This can be taken in pill form. If the condition is severe, magnesium is usually given through an IV.  Changes to your diet. You may be directed to eat foods that have a lot of magnesium, such as green leafy vegetables, peas, beans, and nuts.  Stopping any intake of alcohol. Follow these instructions at home:      Make sure that your diet includes foods with magnesium. Foods that have a lot of magnesium in them include: ? Green leafy vegetables, such as spinach and broccoli. ? Beans and peas. ? Nuts and seeds, such as almonds and sunflower seeds. ? Whole grains, such as whole grain bread and fortified cereals.  Take magnesium supplements if your health care provider tells you to do that. Take them as directed.  Take over-the-counter and prescription medicines only as told by your health care provider.  Have your magnesium levels monitored as told by your health care provider.  When you are active, drink fluids that contain electrolytes.  Avoid drinking alcohol.  Keep all follow-up visits as told by your health care provider. This is important. Contact a health care provider if:  You get worse instead of better.  Your symptoms return. Get help right away if  you:  Develop severe muscle weakness.  Have trouble breathing.  Feel that your heart is racing. Summary  Hypomagnesemia is a condition in which the level of magnesium in the blood is low.  Hypomagnesemia can affect every organ in the body.  Treatment may include eating more foods that contain magnesium, taking magnesium supplements, and not drinking alcohol.  Have your magnesium levels monitored as told by your health care provider. This information is not intended to replace advice given to you by your health care provider. Make sure you discuss any questions you have with your health care provider. Document Released: 08/02/2005 Document Revised: 10/19/2017 Document Reviewed: 10/08/2017 Elsevier Patient Education  2020 [employer].

## 2019-07-09 NOTE — Patient Instructions (Signed)

## 2019-07-09 NOTE — Telephone Encounter (Signed)
Next weeks appt the saame , added follow up per 8/19 los - all other appts adjusted . Pt to get an updated schedule next visit.

## 2019-07-16 ENCOUNTER — Inpatient Hospital Stay: Payer: Medicare Other

## 2019-07-16 ENCOUNTER — Inpatient Hospital Stay (HOSPITAL_BASED_OUTPATIENT_CLINIC_OR_DEPARTMENT_OTHER): Payer: Medicare Other | Admitting: Physician Assistant

## 2019-07-16 ENCOUNTER — Other Ambulatory Visit: Payer: Medicare Other

## 2019-07-16 ENCOUNTER — Other Ambulatory Visit: Payer: Self-pay

## 2019-07-16 ENCOUNTER — Ambulatory Visit: Payer: Medicare Other

## 2019-07-16 ENCOUNTER — Encounter: Payer: Self-pay | Admitting: Physician Assistant

## 2019-07-16 ENCOUNTER — Ambulatory Visit: Payer: Medicare Other | Admitting: Internal Medicine

## 2019-07-16 VITALS — BP 144/80 | HR 85 | Temp 98.3°F | Resp 18 | Ht 65.0 in | Wt 178.1 lb

## 2019-07-16 DIAGNOSIS — E039 Hypothyroidism, unspecified: Secondary | ICD-10-CM | POA: Diagnosis not present

## 2019-07-16 DIAGNOSIS — Z5111 Encounter for antineoplastic chemotherapy: Secondary | ICD-10-CM | POA: Diagnosis not present

## 2019-07-16 DIAGNOSIS — E876 Hypokalemia: Secondary | ICD-10-CM

## 2019-07-16 DIAGNOSIS — C7951 Secondary malignant neoplasm of bone: Secondary | ICD-10-CM | POA: Diagnosis not present

## 2019-07-16 DIAGNOSIS — E871 Hypo-osmolality and hyponatremia: Secondary | ICD-10-CM | POA: Diagnosis not present

## 2019-07-16 DIAGNOSIS — C787 Secondary malignant neoplasm of liver and intrahepatic bile duct: Secondary | ICD-10-CM | POA: Diagnosis not present

## 2019-07-16 DIAGNOSIS — Z79899 Other long term (current) drug therapy: Secondary | ICD-10-CM | POA: Diagnosis not present

## 2019-07-16 DIAGNOSIS — C3492 Malignant neoplasm of unspecified part of left bronchus or lung: Secondary | ICD-10-CM | POA: Diagnosis not present

## 2019-07-16 DIAGNOSIS — D61818 Other pancytopenia: Secondary | ICD-10-CM | POA: Diagnosis not present

## 2019-07-16 DIAGNOSIS — I10 Essential (primary) hypertension: Secondary | ICD-10-CM

## 2019-07-16 DIAGNOSIS — Z923 Personal history of irradiation: Secondary | ICD-10-CM | POA: Diagnosis not present

## 2019-07-16 DIAGNOSIS — Z95828 Presence of other vascular implants and grafts: Secondary | ICD-10-CM

## 2019-07-16 DIAGNOSIS — D696 Thrombocytopenia, unspecified: Secondary | ICD-10-CM | POA: Diagnosis not present

## 2019-07-16 LAB — CMP (CANCER CENTER ONLY)
ALT: 25 U/L (ref 0–44)
AST: 44 U/L — ABNORMAL HIGH (ref 15–41)
Albumin: 3.8 g/dL (ref 3.5–5.0)
Alkaline Phosphatase: 156 U/L — ABNORMAL HIGH (ref 38–126)
Anion gap: 11 (ref 5–15)
BUN: 5 mg/dL — ABNORMAL LOW (ref 8–23)
CO2: 27 mmol/L (ref 22–32)
Calcium: 8.7 mg/dL — ABNORMAL LOW (ref 8.9–10.3)
Chloride: 86 mmol/L — ABNORMAL LOW (ref 98–111)
Creatinine: 0.63 mg/dL (ref 0.44–1.00)
GFR, Est AFR Am: >60 mL/min (ref >60)
GFR, Estimated: >60 mL/min (ref >60)
Glucose, Bld: 94 mg/dL (ref 70–99)
Potassium: 3.1 mmol/L — ABNORMAL LOW (ref 3.5–5.1)
Sodium: 124 mmol/L — ABNORMAL LOW (ref 135–145)
Total Bilirubin: 0.5 mg/dL (ref 0.3–1.2)
Total Protein: 7 g/dL (ref 6.5–8.1)

## 2019-07-16 LAB — CBC WITH DIFFERENTIAL (CANCER CENTER ONLY)
Abs Immature Granulocytes: 0.01 10*3/uL (ref 0.00–0.07)
Basophils Absolute: 0 10*3/uL (ref 0.0–0.1)
Basophils Relative: 1 %
Eosinophils Absolute: 0 10*3/uL (ref 0.0–0.5)
Eosinophils Relative: 1 %
HCT: 27.4 % — ABNORMAL LOW (ref 36.0–46.0)
Hemoglobin: 9.7 g/dL — ABNORMAL LOW (ref 12.0–15.0)
Immature Granulocytes: 0 %
Lymphocytes Relative: 20 %
Lymphs Abs: 0.7 10*3/uL (ref 0.7–4.0)
MCH: 37 pg — ABNORMAL HIGH (ref 26.0–34.0)
MCHC: 35.4 g/dL (ref 30.0–36.0)
MCV: 104.6 fL — ABNORMAL HIGH (ref 80.0–100.0)
Monocytes Absolute: 0.5 10*3/uL (ref 0.1–1.0)
Monocytes Relative: 15 %
Neutro Abs: 2.1 10*3/uL (ref 1.7–7.7)
Neutrophils Relative %: 63 %
Platelet Count: 61 10*3/uL — ABNORMAL LOW (ref 150–400)
RBC: 2.62 MIL/uL — ABNORMAL LOW (ref 3.87–5.11)
RDW: 14.4 % (ref 11.5–15.5)
WBC Count: 3.3 10*3/uL — ABNORMAL LOW (ref 4.0–10.5)
nRBC: 0 % (ref 0.0–0.2)

## 2019-07-16 LAB — MAGNESIUM: Magnesium: 1.5 mg/dL — ABNORMAL LOW (ref 1.7–2.4)

## 2019-07-16 MED ORDER — SODIUM CHLORIDE 0.9% FLUSH
10.0000 mL | INTRAVENOUS | Status: DC | PRN
  Administered 2019-07-16: 10 mL
  Filled 2019-07-16: qty 10

## 2019-07-16 MED ORDER — SODIUM CHLORIDE 0.9 % IV SOLN
Freq: Once | INTRAVENOUS | Status: AC
  Administered 2019-07-16: 10:00:00 via INTRAVENOUS
  Filled 2019-07-16: qty 1000

## 2019-07-16 MED ORDER — SODIUM CHLORIDE 0.9 % IV SOLN
INTRAVENOUS | Status: DC
  Administered 2019-07-16: 10:00:00 via INTRAVENOUS
  Filled 2019-07-16: qty 250

## 2019-07-16 NOTE — Progress Notes (Signed)
[facility] OFFICE PROGRESS NOTE  [name], MD [place] [address]  DIAGNOSIS: Extensive stage (T3, N2, M1a)small cell lung cancer diagnosed in August 2019 and presented with large left hilar/subhilar mass in addition to mediastinal lymphadenopathy and contracture and mass in the right upper lobe.  PRIOR THERAPY: 1)Systemic therapy with carboplatin for AUC of 5 on day 1, etoposide 100 mg/M2 on days 1, 2 and 3 as well as Tecentriq (Atezolizumab) 1200 mg IV every 3 weeks with Neulasta support. Status post 10 cycles. Starting from cycle #7, the patient will be treated with only single agent immunotherapy with Tecentriq 1200 mg IV every 3 weeks. Last dose was given January 27, 2019 discontinued secondary to disease progression. 2)palliative radiotherapy to the mediastinal lymph nodes.  CURRENT THERAPY: Systemic chemotherapy with cisplatin30mg /M2 and irinotecan 65mg /M2 every 3 weeks. First dose Apr 16, 2019.Status post3cycles.Her dose was reduced to Cisplatin 25 mg/m2 and irinotecan 50 mg/m2 starting cycle #3.  INTERVAL HISTORY: [name] 63 y.o. female returns to the clinic for a follow up visit. The patient is feeling well today without any concerning complaints. The patient continues to tolerate treatment with reduced dose cisplatin and irinotecan fairly well without any adverse effects except for electrolyte abnormalities and pancytopenia, often requiring dose delays. Last week the patient received supplemental potassium IV and magnesium. Denies any fever, chills, night sweats, or weight loss. Denies any chest pain, shortness of breath, cough, or hemoptysis. Denies any nausea, vomiting, diarrhea, or constipation. Denies any headache or visual changes. She denies any bleeding or bruising. The patient is here today for evaluation prior to starting cycle #4   MEDICAL HISTORY: Past Medical History:  Diagnosis Date  . Alpha-1-antitrypsin  deficiency ([place])   . Cervical cancer ([place]) 2004  . 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 This doesn't fit entirely with emphysema.      . Exposure to hepatitis C 09/12/2016  . Fibromyalgia   . GERD (gastroesophageal reflux disease)   . Hyperlipidemia   . IBS (irritable bowel syndrome)   . Obstructive sleep apnea 06/02/2008   NPSG 06/07/08, AHI 6.9/ hr, weight 220 lbs    . Osteoporosis   . Pulmonary fibrosis, unspecified ([place]) 09/29/2016   CXR 03/28/2016  . Rhinitis, nonallergic 05/08/2012  . 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 Vitamin D deficiency     ALLERGIES:  is allergic to penicillins; actonel [risedronate sodium]; advair diskus [fluticasone-salmeterol]; alendronate sodium; aspirin; azithromycin; keflex [cephalexin]; levofloxacin; morphine; nsaids; omnicef [cefdinir]; and erythromycin.  MEDICATIONS:  Current Outpatient Medications  Medication Sig Dispense Refill  . allopurinol (ZYLOPRIM) 300 MG tablet Take 1 tablet (300 mg total) by mouth daily. Begin on 07/25/2018 14 tablet 0  . amitriptyline (ELAVIL) 75 MG tablet Take 75 mg by mouth at bedtime.      Marland Kitchen ezetimibe (ZETIA) 10 MG tablet TAKE 1 TABLET ONCE A DAY 90 tablet 0  . feeding supplement, ENSURE ENLIVE, (ENSURE ENLIVE) LIQD Take 237 mLs by mouth 2 (two) times daily between meals. 60 Bottle 0  . fluticasone (FLONASE) 50 MCG/ACT nasal spray USE 2 SPRAYS IN EACH NOSTRIL ONCE DAILY. 16 g 5  . folic acid (FOLVITE) 1 MG tablet Take 1 mg by mouth daily.    . Glycopyrrolate-Formoterol (BEVESPI AEROSPHERE) 9-4.8 MCG/ACT AERO  Inhale 2 puffs into the lungs 2 (two) times daily. 1 Inhaler 12  . HYDROcodone-acetaminophen (NORCO/VICODIN) 5-325 MG tablet Take 1 tablet by mouth 3 (three) times daily. 30 tablet 0  . levothyroxine  (SYNTHROID) 75 MCG tablet TAKE (1) TABLET DAILY BE- FORE BREAKFAST. 30 tablet 0  . lidocaine-prilocaine (EMLA) cream Apply 1 application topically as needed. 30 g 0  . omeprazole (PRILOSEC) 40 MG capsule TAKE (1) CAPSULE DAILY 90 capsule 0  . potassium chloride SA (K-DUR) 20 MEQ tablet Take 1 tablet (20 mEq total) by mouth 2 (two) times daily. 14 tablet 0  . PROAIR HFA 108 (90 Base) MCG/ACT inhaler 2 PUFFS EVERY 4 HOURS AS NEEDED FOR WHEEZING 8.5 g 0  . rosuvastatin (CRESTOR) 40 MG tablet Take 0.5 tablets (20 mg total) by mouth daily. 45 tablet 3  . albuterol (PROVENTIL) (2.5 MG/3ML) 0.083% nebulizer solution Take 3 mLs (2.5 mg total) by nebulization every 6 (six) hours as needed. (Patient not taking: Reported on 07/16/2019) 90 mL 4  . demeclocycline (DECLOMYCIN) 150 MG tablet TAKE  (1)  TABLET TWICE A DAY. 60 tablet 0  . Nebulizers (COMPRESSOR NEBULIZER) MISC 1 Device by Does not apply route as needed. (Patient not taking: Reported on 07/16/2019) 1 each prn  . prochlorperazine (COMPAZINE) 10 MG tablet Take 1 tablet (10 mg total) by mouth every 6 (six) hours as needed for nausea or vomiting. (Patient not taking: Reported on 06/18/2019) 30 tablet 1   No current facility-administered medications for this visit.    Facility-Administered Medications Ordered in Other Visits  Medication Dose Route Frequency Provider Last Rate Last Dose  . sodium chloride 0.9 % 1,000 mL with potassium chloride 40 mEq, magnesium sulfate 2 g infusion   Intravenous Once Cathyann Kilfoyle L, PA-C        SURGICAL HISTORY:  Past Surgical History:  Procedure Laterality Date  . ABDOMINAL HYSTERECTOMY    . IR IMAGING GUIDED PORT INSERTION  08/02/2018  . LUMBAR DISC SURGERY    . VIDEO BRONCHOSCOPY Bilateral 07/10/2018   Procedure: VIDEO BRONCHOSCOPY WITHOUT FLUORO;  Surgeon: Laurin Coder, MD;  Location: [facility];  Service: Cardiopulmonary;  Laterality: Bilateral;    REVIEW OF SYSTEMS:   Review of Systems   Constitutional: Positive for mild fatigue. Negative for appetite change, chills, fever and unexpected weight change.  HENT:   Negative for mouth sores, nosebleeds, sore throat and trouble swallowing.   Eyes: Negative for eye problems and icterus.  Respiratory: Negative for cough, hemoptysis, shortness of breath and wheezing.   Cardiovascular: Negative for chest pain and leg swelling.  Gastrointestinal: Negative for abdominal pain, constipation, diarrhea, nausea and vomiting.  Genitourinary: Negative for bladder incontinence, difficulty urinating, dysuria, frequency and hematuria.   Musculoskeletal: Negative for back pain, gait problem, neck pain and neck stiffness.  Skin: Negative for itching and rash.  Neurological: Negative for dizziness, extremity weakness, gait problem, headaches, light-headedness and seizures.  Hematological: Negative for adenopathy. Does not bruise/bleed easily.  Psychiatric/Behavioral: Negative for confusion, depression and sleep disturbance. The patient is not nervous/anxious.     PHYSICAL EXAMINATION:  Blood pressure (!) 144/80, pulse 85, temperature 98.3 F (36.8 C), temperature source Temporal, resp. rate 18, height 5\' 5"  (1.651 m), weight 178 lb 1 oz (80.8 kg), SpO2 96 %.  ECOG PERFORMANCE STATUS: 1 - Symptomatic but completely ambulatory  Physical Exam  Constitutional: Oriented to person, place, and time and well-developed, well-nourished, and in no distress.  HENT:  Head: Normocephalic and atraumatic.  Mouth/Throat:  Oropharynx is clear and moist. No oropharyngeal exudate.  Eyes: Conjunctivae are normal. Right eye exhibits no discharge. Left eye exhibits no discharge. No scleral icterus.  Neck: Normal range of motion. Neck supple.  Cardiovascular: Normal rate, regular rhythm, normal heart sounds and intact distal pulses.   Pulmonary/Chest: Crackles noted bilaterally. Effort normal and breath sounds normal. No respiratory distress. No wheezes.  Abdominal:  Soft. Bowel sounds are normal. Exhibits no distension and no mass. There is no tenderness.  Musculoskeletal: Normal range of motion. Exhibits no edema.  Lymphadenopathy:    No cervical adenopathy.  Neurological: Alert and oriented to person, place, and time. Exhibits normal muscle tone. Gait normal. Coordination normal.  Skin: Skin is warm and dry. No rash noted. Not diaphoretic. No erythema. No pallor.  Psychiatric: Mood, memory and judgment normal.  Vitals reviewed.  LABORATORY DATA: Lab Results  Component Value Date   WBC 3.3 (L) 07/16/2019   HGB 9.7 (L) 07/16/2019   HCT 27.4 (L) 07/16/2019   MCV 104.6 (H) 07/16/2019   PLT 61 (L) 07/16/2019      Chemistry      Component Value Date/Time   NA 124 (L) 07/16/2019 0825   NA 124 (L) 07/16/2018 0853   K 3.1 (L) 07/16/2019 0825   CL 86 (L) 07/16/2019 0825   CO2 27 07/16/2019 0825   BUN 5 (L) 07/16/2019 0825   BUN 8 07/16/2018 0853   CREATININE 0.63 07/16/2019 0825   CREATININE 0.68 02/13/2013 1523      Component Value Date/Time   CALCIUM 8.7 (L) 07/16/2019 0825   ALKPHOS 156 (H) 07/16/2019 0825   AST 44 (H) 07/16/2019 0825   ALT 25 07/16/2019 0825   BILITOT 0.5 07/16/2019 0825       RADIOGRAPHIC STUDIES:  No results found.   ASSESSMENT/PLAN:  This is a very pleasant 63 year old Caucasian female diagnosed with extensive stage small cell lung cancer. She presented with a large left perihilar mass as well as mediastinal lymphadenopathy as well as metastatic disease to the liver and bone. She wasdiagnosed in August 2019.  The patient was previously treated with systemic chemotherapy with carboplatin, etoposide, and Tecentriq. She is status post 6 cycles. She then was treated withsingle agent maintenance Tecentriq for 4 cycles. This was discontinued due to evidence of disease progression.  She underwent palliative radiotherapy to the progressive disease involving the lung and mediastinum.  She is currently  undergoing treatment with cisplatin at a reduced dose of 30 mg/m andirinotecan65 mg/m on days 1 and 8 every 3 weeks. She is status post3cycles. She experienced electrolyte disturbances and pancytopenia requiring granixand dose delays with treatment. Her dose was reduced to 25 mg/m2 of Cisplatin and 50 mg/m2 of irinotecan starting from cycle #3. Day 8 of treatment has been cancelled the last few cycles secondary to thrombocytopenia.  The patient was seen with Dr. Julien Nordmann today. Labs were reviewed. Unfortunately, the patient's platelets continue to be low at 61,000. This could be a consequence of the cancer itself or from chemotherapy.   Dr. Julien Nordmann would like to order a restaging CT scan of the chest, abdomen, and pelvis to reassess her disease.   The patient's treatment will be delayed by one week. We will see her back for a follow up visit next week for evaluation and to review her CT scan before starting cycle #4.   The patient will receive 40 meq of KCl and 2 g of magnesium sulfate via IVF today due to her hypokalemia and hypomagnesemia.  The patient was advised to call immediately if she has any concerning symptoms in the interval. The patient voices understanding of current disease status and treatment options and is in agreement with the current care plan. All questions were answered. The patient knows to call the clinic with any problems, questions or concerns. We can certainly see the patient much sooner if necessary   Orders Placed This Encounter  Procedures  . CT Chest W Contrast    Standing Status:   Future    Standing Expiration Date:   07/15/2020    Order Specific Question:   ** REASON FOR EXAM (FREE TEXT)    Answer:   Restaging lung cancer    Order Specific Question:   If indicated for the ordered procedure, I authorize the administration of contrast media per Radiology protocol    Answer:   Yes    Order Specific Question:   Preferred imaging location?    Answer:    [facility]    Order Specific Question:   Radiology Contrast Protocol - do NOT remove file path    Answer:   \\[email or web address]  . CT Abdomen Pelvis W Contrast    Standing Status:   Future    Standing Expiration Date:   07/15/2020    Order Specific Question:   ** REASON FOR EXAM (FREE TEXT)    Answer:   Restaging Lung Cancer    Order Specific Question:   If indicated for the ordered procedure, I authorize the administration of contrast media per Radiology protocol    Answer:   Yes    Order Specific Question:   Preferred imaging location?    Answer:   [facility]    Order Specific Question:   Is Oral Contrast requested for this exam?    Answer:   Yes, Per Radiology protocol    Order Specific Question:   Radiology Contrast Protocol - do NOT remove file path    Answer:   \\[email or web address]  . Magnesium    Standing Status:   Standing    Number of Occurrences:   9    Standing Expiration Date:   07/15/2020     Tobe Sos Juwon Scripter, PA-C 07/16/19  ADDENDUM: Hematology/Oncology Attending: I had a face-to-face encounter with the patient today.  I recommended her care plan.  This is a very pleasant 63 years old white female with extensive stage small cell lung cancer status post induction treatment with carboplatin, etoposide and Tecentriq with partial response followed by disease progression.  The patient is currently undergoing second line treatment with reduced dose cisplatin and irinotecan status post 3 cycles. Unfortunately her platelets count has been low for the last few weeks and we were unable to resume her treatment. I recommended for the patient to have repeat CT scan of the chest, abdomen and pelvis for restaging of her disease before making decision regarding her condition. For the electrolyte abnormality, we will give the patient IV supplements for potassium and magnesium today. She will come back for  follow-up visit in 1 week for evaluation and discussion of her scan results and treatment options. She was advised to call immediately if she has any concerning symptoms in the interval.  Disclaimer: This note was dictated with voice recognition software. Similar sounding words can inadvertently be transcribed and may be missed upon review. Eilleen Kempf, MD 07/16/19

## 2019-07-16 NOTE — Patient Instructions (Signed)
Hypomagnesemia Hypomagnesemia is a condition in which the level of magnesium in the blood is low. Magnesium is a mineral that is found in many foods. It is used in many different processes in the body. Hypomagnesemia can affect every organ in the body. In severe cases, it can cause life-threatening problems. What are the causes? This condition may be caused by:  Not getting enough magnesium in your diet.  Malnutrition.  Problems with absorbing magnesium from the intestines.  Dehydration.  Alcohol abuse.  Vomiting.  Severe or chronic diarrhea.  Some medicines, including medicines that make you urinate more (diuretics).  Certain diseases, such as kidney disease, diabetes, celiac disease, and overactive thyroid. What are the signs or symptoms? Symptoms of this condition include:  Loss of appetite.  Nausea and vomiting.  Involuntary shaking or trembling of a body part (tremor).  Muscle weakness.  Tingling in the arms and legs.  Sudden tightening of muscles (muscle spasms).  Confusion.  Psychiatric issues, such as depression, irritability, or psychosis.  A feeling of fluttering of the heart.  Seizures. These symptoms are more severe if magnesium levels drop suddenly. How is this diagnosed? This condition may be diagnosed based on:  Your symptoms and medical history.  A physical exam.  Blood and urine tests. How is this treated? Treatment depends on the cause and the severity of the condition. It may be treated with:  A magnesium supplement. This can be taken in pill form. If the condition is severe, magnesium is usually given through an IV.  Changes to your diet. You may be directed to eat foods that have a lot of magnesium, such as green leafy vegetables, peas, beans, and nuts.  Stopping any intake of alcohol. Follow these instructions at home:      Make sure that your diet includes foods with magnesium. Foods that have a lot of magnesium in them include:  ? Green leafy vegetables, such as spinach and broccoli. ? Beans and peas. ? Nuts and seeds, such as almonds and sunflower seeds. ? Whole grains, such as whole grain bread and fortified cereals.  Take magnesium supplements if your health care provider tells you to do that. Take them as directed.  Take over-the-counter and prescription medicines only as told by your health care provider.  Have your magnesium levels monitored as told by your health care provider.  When you are active, drink fluids that contain electrolytes.  Avoid drinking alcohol.  Keep all follow-up visits as told by your health care provider. This is important. Contact a health care provider if:  You get worse instead of better.  Your symptoms return. Get help right away if you:  Develop severe muscle weakness.  Have trouble breathing.  Feel that your heart is racing. Summary  Hypomagnesemia is a condition in which the level of magnesium in the blood is low.  Hypomagnesemia can affect every organ in the body.  Treatment may include eating more foods that contain magnesium, taking magnesium supplements, and not drinking alcohol.  Have your magnesium levels monitored as told by your health care provider. This information is not intended to replace advice given to you by your health care provider. Make sure you discuss any questions you have with your health care provider. Document Released: 08/02/2005 Document Revised: 10/19/2017 Document Reviewed: 10/08/2017 Elsevier Patient Education  2020 [employer].    Hypokalemia Hypokalemia means that the amount of potassium in the blood is lower than normal. Potassium is a chemical (electrolyte) that helps regulate the  amount of fluid in the body. It also stimulates muscle tightening (contraction) and helps nerves work properly. Normally, most of the body's potassium is inside cells, and only a very small amount is in the blood. Because the amount in the blood is  so small, minor changes to potassium levels in the blood can be life-threatening. What are the causes? This condition may be caused by:  Antibiotic medicine.  Diarrhea or vomiting. Taking too much of a medicine that helps you have a bowel movement (laxative) can cause diarrhea and lead to hypokalemia.  Chronic kidney disease (CKD).  Medicines that help the body get rid of excess fluid (diuretics).  Eating disorders, such as bulimia.  Low magnesium levels in the body.  Sweating a lot. What are the signs or symptoms? Symptoms of this condition include:  Weakness.  Constipation.  Fatigue.  Muscle cramps.  Mental confusion.  Skipped heartbeats or irregular heartbeat (palpitations).  Tingling or numbness. How is this diagnosed? This condition is diagnosed with a blood test. How is this treated? This condition may be treated by:  Taking potassium supplements by mouth.  Adjusting the medicines that you take.  Eating more foods that contain a lot of potassium. If your potassium level is very low, you may need to get potassium through an IV and be monitored in the hospital. Follow these instructions at home:   Take over-the-counter and prescription medicines only as told by your health care provider. This includes vitamins and supplements.  Eat a healthy diet. A healthy diet includes fresh fruits and vegetables, whole grains, healthy fats, and lean proteins.  If instructed, eat more foods that contain a lot of potassium. This includes: ? Nuts, such as peanuts and pistachios. ? Seeds, such as sunflower seeds and pumpkin seeds. ? Peas, lentils, and lima beans. ? Whole grain and bran cereals and breads. ? Fresh fruits and vegetables, such as apricots, avocado, bananas, cantaloupe, kiwi, oranges, tomatoes, asparagus, and potatoes. ? Orange juice. ? Tomato juice. ? Red meats. ? Yogurt.  Keep all follow-up visits as told by your health care provider. This is important.  Contact a health care provider if you:  Have weakness that gets worse.  Feel your heart pounding or racing.  Vomit.  Have diarrhea.  Have diabetes (diabetes mellitus) and you have trouble keeping your blood sugar (glucose) in your target range. Get help right away if you:  Have chest pain.  Have shortness of breath.  Have vomiting or diarrhea that lasts for more than 2 days.  Faint. Summary  Hypokalemia means that the amount of potassium in the blood is lower than normal.  This condition is diagnosed with a blood test.  Hypokalemia may be treated by taking potassium supplements, adjusting the medicines that you take, or eating more foods that are high in potassium.  If your potassium level is very low, you may need to get potassium through an IV and be monitored in the hospital. This information is not intended to replace advice given to you by your health care provider. Make sure you discuss any questions you have with your health care provider. Document Released: 11/06/2005 Document Revised: 06/19/2018 Document Reviewed: 06/19/2018 Elsevier Patient Education  2020 [employer].

## 2019-07-16 NOTE — Patient Instructions (Signed)
Radiology scheduling [phone]

## 2019-07-18 ENCOUNTER — Other Ambulatory Visit: Payer: Self-pay | Admitting: Internal Medicine

## 2019-07-20 DIAGNOSIS — C3492 Malignant neoplasm of unspecified part of left bronchus or lung: Secondary | ICD-10-CM | POA: Diagnosis not present

## 2019-07-20 DIAGNOSIS — J189 Pneumonia, unspecified organism: Secondary | ICD-10-CM | POA: Diagnosis not present

## 2019-07-21 ENCOUNTER — Other Ambulatory Visit: Payer: Self-pay | Admitting: Internal Medicine

## 2019-07-22 ENCOUNTER — Ambulatory Visit (HOSPITAL_COMMUNITY)
Admission: RE | Admit: 2019-07-22 | Discharge: 2019-07-22 | Disposition: A | Discharge status: Home / Self Care | Payer: Medicare Other | Source: Ambulatory Visit | Attending: Physician Assistant | Admitting: Physician Assistant

## 2019-07-22 ENCOUNTER — Encounter (HOSPITAL_COMMUNITY): Payer: Self-pay

## 2019-07-22 ENCOUNTER — Other Ambulatory Visit: Payer: Self-pay

## 2019-07-22 DIAGNOSIS — C3492 Malignant neoplasm of unspecified part of left bronchus or lung: Secondary | ICD-10-CM | POA: Insufficient documentation

## 2019-07-22 MED ORDER — SODIUM CHLORIDE (PF) 0.9 % IJ SOLN
INTRAMUSCULAR | Status: AC
  Filled 2019-07-22: qty 50

## 2019-07-22 MED ORDER — IOHEXOL 300 MG/ML  SOLN
100.0000 mL | Freq: Once | INTRAMUSCULAR | Status: AC | PRN
  Administered 2019-07-22: 15:00:00 100 mL via INTRAVENOUS

## 2019-07-23 ENCOUNTER — Encounter: Payer: Self-pay | Admitting: Physician Assistant

## 2019-07-23 ENCOUNTER — Other Ambulatory Visit: Payer: Self-pay

## 2019-07-23 ENCOUNTER — Inpatient Hospital Stay: Payer: Medicare Other

## 2019-07-23 ENCOUNTER — Inpatient Hospital Stay (HOSPITAL_BASED_OUTPATIENT_CLINIC_OR_DEPARTMENT_OTHER): Payer: Medicare Other | Admitting: Physician Assistant

## 2019-07-23 ENCOUNTER — Other Ambulatory Visit: Payer: Medicare Other

## 2019-07-23 ENCOUNTER — Ambulatory Visit: Payer: Medicare Other | Admitting: Internal Medicine

## 2019-07-23 ENCOUNTER — Inpatient Hospital Stay: Payer: Medicare Other | Attending: Internal Medicine

## 2019-07-23 ENCOUNTER — Ambulatory Visit: Payer: Medicare Other

## 2019-07-23 ENCOUNTER — Other Ambulatory Visit: Payer: Self-pay | Admitting: Internal Medicine

## 2019-07-23 DIAGNOSIS — Z79899 Other long term (current) drug therapy: Secondary | ICD-10-CM | POA: Insufficient documentation

## 2019-07-23 DIAGNOSIS — C3492 Malignant neoplasm of unspecified part of left bronchus or lung: Secondary | ICD-10-CM | POA: Insufficient documentation

## 2019-07-23 DIAGNOSIS — I10 Essential (primary) hypertension: Secondary | ICD-10-CM

## 2019-07-23 DIAGNOSIS — E785 Hyperlipidemia, unspecified: Secondary | ICD-10-CM | POA: Insufficient documentation

## 2019-07-23 DIAGNOSIS — D61818 Other pancytopenia: Secondary | ICD-10-CM | POA: Insufficient documentation

## 2019-07-23 DIAGNOSIS — J449 Chronic obstructive pulmonary disease, unspecified: Secondary | ICD-10-CM | POA: Diagnosis not present

## 2019-07-23 DIAGNOSIS — E876 Hypokalemia: Secondary | ICD-10-CM | POA: Insufficient documentation

## 2019-07-23 DIAGNOSIS — Z7189 Other specified counseling: Secondary | ICD-10-CM

## 2019-07-23 DIAGNOSIS — C787 Secondary malignant neoplasm of liver and intrahepatic bile duct: Secondary | ICD-10-CM | POA: Diagnosis not present

## 2019-07-23 DIAGNOSIS — Z5111 Encounter for antineoplastic chemotherapy: Secondary | ICD-10-CM | POA: Diagnosis not present

## 2019-07-23 DIAGNOSIS — C7951 Secondary malignant neoplasm of bone: Secondary | ICD-10-CM | POA: Insufficient documentation

## 2019-07-23 DIAGNOSIS — Z95828 Presence of other vascular implants and grafts: Secondary | ICD-10-CM

## 2019-07-23 LAB — CBC WITH DIFFERENTIAL (CANCER CENTER ONLY)
Abs Immature Granulocytes: 0.01 10*3/uL (ref 0.00–0.07)
Basophils Absolute: 0 10*3/uL (ref 0.0–0.1)
Basophils Relative: 0 %
Eosinophils Absolute: 0 10*3/uL (ref 0.0–0.5)
Eosinophils Relative: 1 %
HCT: 28.2 % — ABNORMAL LOW (ref 36.0–46.0)
Hemoglobin: 10 g/dL — ABNORMAL LOW (ref 12.0–15.0)
Immature Granulocytes: 0 %
Lymphocytes Relative: 15 %
Lymphs Abs: 0.7 10*3/uL (ref 0.7–4.0)
MCH: 37 pg — ABNORMAL HIGH (ref 26.0–34.0)
MCHC: 35.5 g/dL (ref 30.0–36.0)
MCV: 104.4 fL — ABNORMAL HIGH (ref 80.0–100.0)
Monocytes Absolute: 0.6 10*3/uL (ref 0.1–1.0)
Monocytes Relative: 13 %
Neutro Abs: 3.4 10*3/uL (ref 1.7–7.7)
Neutrophils Relative %: 71 %
Platelet Count: 88 10*3/uL — ABNORMAL LOW (ref 150–400)
RBC: 2.7 MIL/uL — ABNORMAL LOW (ref 3.87–5.11)
RDW: 13.9 % (ref 11.5–15.5)
WBC Count: 4.8 10*3/uL (ref 4.0–10.5)
nRBC: 0 % (ref 0.0–0.2)

## 2019-07-23 LAB — CMP (CANCER CENTER ONLY)
ALT: 30 U/L (ref 0–44)
AST: 52 U/L — ABNORMAL HIGH (ref 15–41)
Albumin: 3.9 g/dL (ref 3.5–5.0)
Alkaline Phosphatase: 146 U/L — ABNORMAL HIGH (ref 38–126)
Anion gap: 13 (ref 5–15)
BUN: 6 mg/dL — ABNORMAL LOW (ref 8–23)
CO2: 25 mmol/L (ref 22–32)
Calcium: 9.1 mg/dL (ref 8.9–10.3)
Chloride: 87 mmol/L — ABNORMAL LOW (ref 98–111)
Creatinine: 0.63 mg/dL (ref 0.44–1.00)
GFR, Est AFR Am: >60 mL/min (ref >60)
GFR, Estimated: >60 mL/min (ref >60)
Glucose, Bld: 114 mg/dL — ABNORMAL HIGH (ref 70–99)
Potassium: 2.9 mmol/L — CL (ref 3.5–5.1)
Sodium: 125 mmol/L — ABNORMAL LOW (ref 135–145)
Total Bilirubin: 0.5 mg/dL (ref 0.3–1.2)
Total Protein: 7.2 g/dL (ref 6.5–8.1)

## 2019-07-23 LAB — MAGNESIUM: Magnesium: 1.5 mg/dL — ABNORMAL LOW (ref 1.7–2.4)

## 2019-07-23 MED ORDER — SODIUM CHLORIDE 0.9 % IV SOLN: Freq: Once | INTRAVENOUS | Status: DC

## 2019-07-23 MED ORDER — SODIUM CHLORIDE 0.9% FLUSH
10.0000 mL | INTRAVENOUS | Status: DC | PRN
  Administered 2019-07-23: 10 mL
  Filled 2019-07-23: qty 10

## 2019-07-23 MED ORDER — SODIUM CHLORIDE 0.9 % IV SOLN
Freq: Once | INTRAVENOUS | Status: AC
  Administered 2019-07-23: 10:00:00 via INTRAVENOUS
  Filled 2019-07-23: qty 1000

## 2019-07-23 MED ORDER — POTASSIUM CHLORIDE CRYS ER 20 MEQ PO TBCR: 20.0000 meq | EXTENDED_RELEASE_TABLET | Freq: Two times a day (BID) | ORAL | 0 refills | Status: DC

## 2019-07-23 MED ORDER — HEPARIN SOD (PORK) LOCK FLUSH 100 UNIT/ML IV SOLN
500.0000 [IU] | Freq: Once | INTRAVENOUS | Status: AC | PRN
  Administered 2019-07-23: 14:00:00 500 [IU]
  Filled 2019-07-23: qty 5

## 2019-07-23 NOTE — Patient Instructions (Signed)
Magnesium Sulfate injection What is this medicine? MAGNESIUM SULFATE (mag NEE zee um SUL fate) is an electrolyte injection commonly used to treat low magnesium levels in your blood. It is also used to prevent or control seizures in women with preeclampsia or eclampsia. This medicine may be used for other purposes; ask your health care provider or pharmacist if you have questions. What should I tell my health care provider before I take this medicine? They need to know if you have any of these conditions:  heart disease  history of irregular heart beat  kidney disease  an unusual or allergic reaction to magnesium sulfate, medicines, foods, dyes, or preservatives  pregnant or trying to get pregnant  breast-feeding How should I use this medicine? This medicine is for infusion into a vein. It is given by a health care professional in a hospital or clinic setting. Talk to your pediatrician regarding the use of this medicine in children. While this drug may be prescribed for selected conditions, precautions do apply. Overdosage: If you think you have taken too much of this medicine contact a poison control center or emergency room at once. NOTE: This medicine is only for you. Do not share this medicine with others. What if I miss a dose? This does not apply. What may interact with this medicine? This medicine may interact with the following medications:  certain medicines for anxiety or sleep  certain medicines for seizures like phenobarbital  digoxin  medicines that relax muscles for surgery  narcotic medicines for pain This list may not describe all possible interactions. Give your health care provider a list of all the medicines, herbs, non-prescription drugs, or dietary supplements you use. Also tell them if you smoke, drink alcohol, or use illegal drugs. Some items may interact with your medicine. What should I watch for while using this medicine? Your condition will be  monitored carefully while you are receiving this medicine. You may need blood work done while you are receiving this medicine. What side effects may I notice from receiving this medicine? Side effects that you should report to your doctor or health care professional as soon as possible:  allergic reactions like skin rash, itching or hives, swelling of the face, lips, or tongue  facial flushing  muscle weakness  signs and symptoms of low blood pressure like dizziness; feeling faint or lightheaded, falls; unusually weak or tired  signs and symptoms of a dangerous change in heartbeat or heart rhythm like chest pain; dizziness; fast or irregular heartbeat; palpitations; breathing problems  sweating This list may not describe all possible side effects. Call your doctor for medical advice about side effects. You may report side effects to FDA at 1-800-FDA-1088. Where should I keep my medicine? This drug is given in a hospital or clinic and will not be stored at home. NOTE: This sheet is a summary. It may not cover all possible information. If you have questions about this medicine, talk to your doctor, pharmacist, or health care provider.  2020 Elsevier/Gold Standard (2016-05-24 12:31:42)  

## 2019-07-23 NOTE — Patient Instructions (Signed)
-  New drug once every 3 weeks -Start next week -

## 2019-07-23 NOTE — Progress Notes (Signed)
[name] OFFICE PROGRESS NOTE  [name], [name] [place] [address]  DIAGNOSIS: Extensive stage (T3, N2, M1a)small cell lung cancer diagnosed in August 2019 and presented with large left hilar/subhilar mass in addition to mediastinal lymphadenopathy and contracture and mass in the right upper lobe.  PRIOR THERAPY: 1)Systemic therapy with carboplatin for AUC of 5 on day 1, etoposide 100 mg/M2 on days 1, 2 and 3 as well as Tecentriq (Atezolizumab) 1200 mg IV every 3 weeks with Neulasta support. Status post 10 cycles. Starting from cycle #7, the patient will be treated with only single agent immunotherapy with Tecentriq 1200 mg IV every 3 weeks. Last dose was given January 27, 2019 discontinued secondary to disease progression. 2)palliative radiotherapy to the mediastinal lymph nodes. 3) Systemic chemotherapy with cisplatin30mg /M2 and irinotecan 65mg /M2 every 3 weeks. Status post3cycles.Her dose was reduced to Cisplatin 25 mg/m2 and irinotecan 50 mg/m2 starting cycle #3. Last dose 06/18/2019.   CURRENT THERAPY: Zepzelca 3.2 mg/m2 IV every 3 weeks. First dose expected on 08/01/2019  INTERVAL HISTORY: [name] 63 y.o. female returns to the clinic for a follow up visit. The patient is feeling well today without any concerning complaints. She continues to tolerate treatment with a reduced dose of cisplatin and irinotecan except for electrolyte abnormalities and pancytopenia which often require frequent dose delays and supplemental electrolytes. The patient states that she is eating a diet rich in potassium. The patient denies any fever, chills, night sweats, or weight loss. She denies any nausea, vomiting, diarrhea, or constipation. She denies any headaches or visual changes. She denies any shortness of breath, cough, hemoptysis, or chest pain. She recently had a restaging CT scan performed. She is here for evaluation and to review her scan results  before starting cycle #3.   MEDICAL HISTORY: Past Medical History:  Diagnosis Date  . Alpha-1-antitrypsin deficiency ([place])   . Cervical cancer ([place]) 2004  . 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 This doesn't fit entirely with emphysema.      . Exposure to hepatitis C 09/12/2016  . Fibromyalgia   . GERD (gastroesophageal reflux disease)   . Hyperlipidemia   . IBS (irritable bowel syndrome)   . Obstructive sleep apnea 06/02/2008   NPSG 06/07/08, AHI 6.9/ hr, weight 220 lbs    . Osteoporosis   . Pulmonary fibrosis, unspecified ([place]) 09/29/2016   CXR 03/28/2016  . Rhinitis, nonallergic 05/08/2012  . 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 Vitamin D deficiency     ALLERGIES:  is allergic to penicillins; actonel [risedronate sodium]; advair diskus [fluticasone-salmeterol]; alendronate sodium; aspirin; azithromycin; keflex [cephalexin]; levofloxacin; morphine; nsaids; omnicef [cefdinir]; and erythromycin.  MEDICATIONS:  Current Outpatient Medications  Medication Sig Dispense Refill  . albuterol (PROVENTIL) (2.5 MG/3ML) 0.083% nebulizer solution Take 3 mLs (2.5 mg total) by nebulization every 6 (six) hours as needed. (Patient not taking: Reported on 07/16/2019) 90 mL 4  . allopurinol (ZYLOPRIM) 300 MG tablet Take 1 tablet (300 mg total) by mouth daily. Begin on 07/25/2018 14 tablet 0  . amitriptyline (ELAVIL) 75 MG tablet Take 75 mg by mouth at bedtime.      . benzonatate (TESSALON) 100 MG capsule TAKE 1 CAPSULE THREE TIMES DAILY AS NEEDED FOR COUGH 20 capsule 0  . demeclocycline (DECLOMYCIN) 150  MG tablet TAKE  (1)  TABLET TWICE A DAY. 60 tablet 0  . ezetimibe (ZETIA) 10 MG tablet TAKE 1 TABLET ONCE A DAY 90 tablet 0  . feeding supplement, ENSURE ENLIVE, (ENSURE ENLIVE) LIQD Take 237 mLs  by mouth 2 (two) times daily between meals. 60 Bottle 0  . fluticasone (FLONASE) 50 MCG/ACT nasal spray USE 2 SPRAYS IN EACH NOSTRIL ONCE DAILY. 16 g 5  . folic acid (FOLVITE) 1 MG tablet Take 1 mg by mouth daily.    . Glycopyrrolate-Formoterol (BEVESPI AEROSPHERE) 9-4.8 MCG/ACT AERO Inhale 2 puffs into the lungs 2 (two) times daily. 1 Inhaler 12  . HYDROcodone-acetaminophen (NORCO/VICODIN) 5-325 MG tablet Take 1 tablet by mouth 3 (three) times daily. 30 tablet 0  . levothyroxine (SYNTHROID) 75 MCG tablet TAKE (1) TABLET DAILY BE- FORE BREAKFAST. 30 tablet 0  . lidocaine-prilocaine (EMLA) cream Apply 1 application topically as needed. 30 g 0  . Nebulizers (COMPRESSOR NEBULIZER) MISC 1 Device by Does not apply route as needed. (Patient not taking: Reported on 07/16/2019) 1 each prn  . omeprazole (PRILOSEC) 40 MG capsule TAKE (1) CAPSULE DAILY 90 capsule 0  . potassium chloride SA (K-DUR) 20 MEQ tablet Take 1 tablet (20 mEq total) by mouth 2 (two) times daily. 14 tablet 0  . PROAIR HFA 108 (90 Base) MCG/ACT inhaler 2 PUFFS EVERY 4 HOURS AS NEEDED FOR WHEEZING 8.5 g 0  . prochlorperazine (COMPAZINE) 10 MG tablet Take 1 tablet (10 mg total) by mouth every 6 (six) hours as needed for nausea or vomiting. (Patient not taking: Reported on 06/18/2019) 30 tablet 1  . rosuvastatin (CRESTOR) 40 MG tablet Take 0.5 tablets (20 mg total) by mouth daily. 45 tablet 3   No current facility-administered medications for this visit.    Facility-Administered Medications Ordered in Other Visits  Medication Dose Route Frequency Provider Last Rate Last Dose  . sodium chloride 0.9 % 1,000 mL with potassium chloride 40 mEq, magnesium sulfate 4 g infusion   Intravenous Once Rose, Jessica L, PA-C 250 mL/hr at 07/23/19 1003      SURGICAL HISTORY:  Past Surgical History:  Procedure Laterality Date  . ABDOMINAL HYSTERECTOMY    . IR IMAGING GUIDED PORT INSERTION  08/02/2018  . LUMBAR DISC SURGERY    . VIDEO  BRONCHOSCOPY Bilateral 07/10/2018   Procedure: VIDEO BRONCHOSCOPY WITHOUT FLUORO;  Surgeon: [name], [name];  Location: [facility];  Service: Cardiopulmonary;  Laterality: Bilateral;    REVIEW OF SYSTEMS:   Review of Systems  Constitutional: Negative for appetite change, chills, fatigue, fever and unexpected weight change.  HENT:   Negative for mouth sores, nosebleeds, sore throat and trouble swallowing.   Eyes: Negative for eye problems and icterus.  Respiratory: Negative for cough, hemoptysis, shortness of breath and wheezing.   Cardiovascular: Negative for chest pain and leg swelling.  Gastrointestinal: Negative for abdominal pain, constipation, diarrhea, nausea and vomiting.  Genitourinary: Negative for bladder incontinence, difficulty urinating, dysuria, frequency and hematuria.   Musculoskeletal: Negative for back pain, gait problem, neck pain and neck stiffness.  Skin: Negative for itching and rash.  Neurological: Negative for dizziness, extremity weakness, gait problem, headaches, light-headedness and seizures.  Hematological: Negative for adenopathy. Does not bruise/bleed easily.  Psychiatric/Behavioral: Negative for confusion, depression and sleep disturbance. The patient is not nervous/anxious.     PHYSICAL EXAMINATION:  Blood pressure 136/68, pulse 82, temperature 98.3 F (36.8 C), resp. rate 18, height 5\' 5"  (1.651 m), weight 174 lb 6.4 oz (79.1  kg), SpO2 96 %.  ECOG PERFORMANCE STATUS: 1 - Symptomatic but completely ambulatory  Physical Exam Constitutional: Oriented to person, place, and time and well-developed, well-nourished, and in no distress.  HENT:  Head: Normocephalic and atraumatic.  Mouth/Throat: Oropharynx is clear and moist. No oropharyngeal exudate.  Eyes: Conjunctivae are normal. Right eye exhibits no discharge. Left eye exhibits no discharge. No scleral icterus.  Neck: Normal range of motion. Neck supple.  Cardiovascular: Normal rate, regular  rhythm, normal heart sounds and intact distal pulses.  Pulmonary/Chest:Crackles noted bilaterally.Effort normal and breath sounds normal. No respiratory distress. No wheezes.  Abdominal: Soft. Bowel sounds are normal. Exhibits no distension and no mass. There is no tenderness.  Musculoskeletal: Normal range of motion. Exhibits no edema.  Lymphadenopathy:  No cervical adenopathy.  Neurological: Alert and oriented to person, place, and time. Exhibits normal muscle tone. Gait normal. Coordination normal.  Skin: Skin is warm and dry. No rash noted. Not diaphoretic. No erythema. No pallor.  Psychiatric: Mood, memory and judgment normal.  Vitals reviewed.  LABORATORY DATA: Lab Results  Component Value Date   WBC 4.8 07/23/2019   HGB 10.0 (Rose) 07/23/2019   HCT 28.2 (Rose) 07/23/2019   MCV 104.4 (H) 07/23/2019   PLT 88 (Rose) 07/23/2019      Chemistry      Component Value Date/Time   NA 125 (Rose) 07/23/2019 0748   NA 124 (Rose) 07/16/2018 0853   K 2.9 (LL) 07/23/2019 0748   CL 87 (Rose) 07/23/2019 0748   CO2 25 07/23/2019 0748   BUN 6 (Rose) 07/23/2019 0748   BUN 8 07/16/2018 0853   CREATININE 0.63 07/23/2019 0748   CREATININE 0.68 02/13/2013 1523      Component Value Date/Time   CALCIUM 9.1 07/23/2019 0748   ALKPHOS 146 (H) 07/23/2019 0748   AST 52 (H) 07/23/2019 0748   ALT 30 07/23/2019 0748   BILITOT 0.5 07/23/2019 0748       RADIOGRAPHIC STUDIES:  Ct Chest W Contrast  Result Date: 07/23/2019 CLINICAL DATA:  Extensive stage small cell lung cancer. EXAM: CT CHEST, ABDOMEN, AND PELVIS WITH CONTRAST TECHNIQUE: Multidetector CT imaging of the chest, abdomen and pelvis was performed following the standard protocol during bolus administration of intravenous contrast. CONTRAST:  124mL OMNIPAQUE IOHEXOL 300 MG/ML  SOLN COMPARISON:  02/14/2019 FINDINGS: CT CHEST FINDINGS Cardiovascular: The heart size appears within normal limits. Aortic atherosclerosis. Lad and RCA coronary artery  calcifications. No pericardial effusion. Mediastinum/Nodes: Thyroid gland is either atrophic or surgically absent. The trachea appears patent and is midline. Normal appearance of the esophagus. Left paratracheal lymph node measures 1.2 cm, image 20/2. Previously this measured the same. The index left hilar lesion measures 0.9 x 0.9 cm, image 27/2. Previously 1.1 by 1.0 cm. No new or enlarging mediastinal or hilar lymph nodes. Lungs/Pleura: No pleural effusion. Centrilobular emphysema. Geographic area of fibrosis, ground-glass attenuation and architectural distortion is identified within the left mid lung compatible with changes secondary to external beam radiation. Irregular density in the right base and posterior row medial right lower lobe identified, image 99/6. New from previous exam, favored to represent postinflammatory change. No suspicious pulmonary nodule or mass. Musculoskeletal: No chest wall mass or suspicious bone lesions identified. CT ABDOMEN PELVIS FINDINGS Hepatobiliary: The liver appears cirrhotic. Faint, ill-defined liver lesions are again identified scattered throughout both lobes. Index lesion within lateral segment of left lobe measures 2.1 cm, image 59/2. Previously 1.3 cm. The index lesion within lateral segment of left lobe of liver  measures 0.9 cm, image 63/2. Unchanged. Also in left lobe of liver is a 1.9 cm low-density lesion, image 58/2. Previously 1 cm. Previously measured central right lobe of liver lesion is not confidently seen on today's study. Gallbladder normal. No biliary dilatation. Pancreas: Unremarkable. No pancreatic ductal dilatation or surrounding inflammatory changes. Spleen: Normal in size without focal abnormality. Adrenals/Urinary Tract: Normal appearance of the adrenal glands. No kidney mass or hydronephrosis. The urinary bladder is unremarkable. Stomach/Bowel: The small bowel loops are nondilated. The appendix is visualized and appears normal. No pathologic dilatation  of the colon. Vascular/Lymphatic: Aortic atherosclerosis. No aneurysm. The index portacaval node measures 1.2 cm, image 61/2. Unchanged. Left periaortic node measures 1.2 cm, image 56/2. Unchanged. Previous pelvic node dissection. Reproductive: Status post hysterectomy.  No adnexal mass. Other: No ascites or focal fluid collections. Musculoskeletal: Increased sclerosis and right femoral head compatible with AVN.Previous interbody fusion of L4-5. No acute or suspicious osseous findings. IMPRESSION: 1. Stable appearance of the chest. Small residual soft tissue in the left perihilar region is unchanged from prior study. 2. Multiple hepatic lesions within a background of cirrhosis are again noted. Lesions are ill-defined and difficult to measure by CT. There are several low-density lesions in left lobe of liver which appear increased in size in the interval. Other lesions are unchanged or less conspicuous. 3. Unchanged borderline upper abdominal adenopathy. 4. Cirrhosis 5. Aortic Atherosclerosis (ICD10-I70.0) and Emphysema (ICD10-J43.9). Coronary artery calcifications. Electronically Signed   By: Kerby Moors M.D.   On: 07/23/2019 09:02   Ct Abdomen Pelvis W Contrast  Result Date: 07/23/2019 CLINICAL DATA:  Extensive stage small cell lung cancer. EXAM: CT CHEST, ABDOMEN, AND PELVIS WITH CONTRAST TECHNIQUE: Multidetector CT imaging of the chest, abdomen and pelvis was performed following the standard protocol during bolus administration of intravenous contrast. CONTRAST:  146mL OMNIPAQUE IOHEXOL 300 MG/ML  SOLN COMPARISON:  02/14/2019 FINDINGS: CT CHEST FINDINGS Cardiovascular: The heart size appears within normal limits. Aortic atherosclerosis. Lad and RCA coronary artery calcifications. No pericardial effusion. Mediastinum/Nodes: Thyroid gland is either atrophic or surgically absent. The trachea appears patent and is midline. Normal appearance of the esophagus. Left paratracheal lymph node measures 1.2 cm, image  20/2. Previously this measured the same. The index left hilar lesion measures 0.9 x 0.9 cm, image 27/2. Previously 1.1 by 1.0 cm. No new or enlarging mediastinal or hilar lymph nodes. Lungs/Pleura: No pleural effusion. Centrilobular emphysema. Geographic area of fibrosis, ground-glass attenuation and architectural distortion is identified within the left mid lung compatible with changes secondary to external beam radiation. Irregular density in the right base and posterior row medial right lower lobe identified, image 99/6. New from previous exam, favored to represent postinflammatory change. No suspicious pulmonary nodule or mass. Musculoskeletal: No chest wall mass or suspicious bone lesions identified. CT ABDOMEN PELVIS FINDINGS Hepatobiliary: The liver appears cirrhotic. Faint, ill-defined liver lesions are again identified scattered throughout both lobes. Index lesion within lateral segment of left lobe measures 2.1 cm, image 59/2. Previously 1.3 cm. The index lesion within lateral segment of left lobe of liver measures 0.9 cm, image 63/2. Unchanged. Also in left lobe of liver is a 1.9 cm low-density lesion, image 58/2. Previously 1 cm. Previously measured central right lobe of liver lesion is not confidently seen on today's study. Gallbladder normal. No biliary dilatation. Pancreas: Unremarkable. No pancreatic ductal dilatation or surrounding inflammatory changes. Spleen: Normal in size without focal abnormality. Adrenals/Urinary Tract: Normal appearance of the adrenal glands. No kidney mass or hydronephrosis. The  urinary bladder is unremarkable. Stomach/Bowel: The small bowel loops are nondilated. The appendix is visualized and appears normal. No pathologic dilatation of the colon. Vascular/Lymphatic: Aortic atherosclerosis. No aneurysm. The index portacaval node measures 1.2 cm, image 61/2. Unchanged. Left periaortic node measures 1.2 cm, image 56/2. Unchanged. Previous pelvic node dissection. Reproductive:  Status post hysterectomy.  No adnexal mass. Other: No ascites or focal fluid collections. Musculoskeletal: Increased sclerosis and right femoral head compatible with AVN.Previous interbody fusion of L4-5. No acute or suspicious osseous findings. IMPRESSION: 1. Stable appearance of the chest. Small residual soft tissue in the left perihilar region is unchanged from prior study. 2. Multiple hepatic lesions within a background of cirrhosis are again noted. Lesions are ill-defined and difficult to measure by CT. There are several low-density lesions in left lobe of liver which appear increased in size in the interval. Other lesions are unchanged or less conspicuous. 3. Unchanged borderline upper abdominal adenopathy. 4. Cirrhosis 5. Aortic Atherosclerosis (ICD10-I70.0) and Emphysema (ICD10-J43.9). Coronary artery calcifications. Electronically Signed   By: Kerby Moors M.D.   On: 07/23/2019 09:02     ASSESSMENT/PLAN:  This is a very pleasant 63 year old Caucasian female diagnosed with extensive stage small cell lung cancer. She presented with a large left perihilar mass as well as mediastinal lymphadenopathy as well as metastatic disease to the liver and bone. She wasdiagnosed in August 2019.  The patient was previously treated with systemic chemotherapy with carboplatin, etoposide, and Tecentriq. She is status post 6 cycles. She then was treated withsingle agent maintenance Tecentriq for 4 cycles. This was discontinued due to evidence of disease progression.  She underwent palliative radiotherapy to the progressive disease involving the lung and mediastinum.  She is currently undergoing treatment with cisplatin at a reduced dose of 30 mg/m andirinotecan65 mg/m on days 1 and 8 every 3 weeks. She is status post3cycles. She experienced electrolyte disturbances and pancytopenia requiring granixand dose delays with treatment. Her dose was reduced to 25 mg/m2 of Cisplatin and 50 mg/m2 of irinotecan  starting fromcycle #3. Day 8 of treatment has been cancelled the last few cycles secondary to thrombocytopenia.  The patient recently had a restaging CT scan performed.  Dr. Julien Nordmann personally and independently reviewed the scan and discussed the results with the patient today. The scan showed stable disease in the chest. The liver showed some increase in the liver lesions.   Labs were reviewed with the patient which show thrombocytopenia, hypomagnesemia, and hypokalemia.   Given the patient's frequent dose delays and significant lab abnormalities, Dr. Julien Nordmann recommends changing the patient's treatment to Zepzelca IV every 3 weeks. The patient is in agreement with the current plan and she is expected to receive her first dose next week   The patient will receive an additional 40 meq of KCl today and 4 g of magnesium sulfate in 1 Rose of normal saline today. I have sent p.o. KCl 20 meq BID for 7 days to the patient's pharmacy.   We will see the patient back for a follow up visit in 1 week for evaluation before starting cycle #1 of Zepzelca.   The patient was advised to call immediately if she has any concerning symptoms in the interval. The patient voices understanding of current disease status and treatment options and is in agreement with the current care plan. All questions were answered. The patient knows to call the clinic with any problems, questions or concerns. We can certainly see the patient much sooner if necessary   No  orders of the defined types were placed in this encounter.    [name] Heilingoetter, PA-C 07/23/19  ADDENDUM: Hematology/Oncology Attending: I had a face-to-face encounter with the patient today.  I recommended her care plan.  This is a very pleasant 63 years old white female with recurrent small cell lung cancer after induction systemic chemotherapy with carboplatin, etoposide and Tecentriq.  The patient was started on second line treatment with reduced dose  cisplatin and irinotecan status post 3 cycles.  She tolerated the treatment well but she had significant electrolyte imbalance with hypokalemia, hypomagnesemia as well as persistent pancytopenia for several weeks and we were unable to resume her treatment. She had repeat CT scan of the chest, abdomen pelvis performed recently.  I personally and independently reviewed the scans and discussed the results with the patient today. Her scan showed no concerning findings for disease progression except for slight increase in some of the liver lesion. I recommended for the patient to discontinue her previous treatment with cisplatin and irinotecan and then will consider her for treatment with the new approved second line therapy for small cell lung cancer with zepzelca every 3 weeks. We discussed with the patient the adverse effect of this treatment including myelosuppression as well as nausea and vomiting and hair loss. She is expected to start the first dose of this treatment next week. She will come back for follow-up visit at that time. For the hypomagnesemia and hypokalemia we will arrange for the patient to receive magnesium and potassium supplements intravenously in the clinic today. She was advised to call immediately if she has any concerning symptoms in the interval.  Disclaimer: This note was dictated with voice recognition software. Similar sounding words can inadvertently be transcribed and may be missed upon review. Jessica Kempf, [name] 07/23/19

## 2019-07-23 NOTE — Progress Notes (Signed)
Per Cassie/MD, pt to get electrolytes/fluids only today. Treatment is to change to Zepzelca next week.   Demetrius Charity, PharmD, [facility] Pharmacist Pharmacy Phone: [phone] 07/23/2019

## 2019-07-23 NOTE — Progress Notes (Signed)
START ON PATHWAY REGIMEN - Small Cell Lung     A cycle is every 21 days:     Lurbinectedin   **Always confirm dose/schedule in your pharmacy ordering system**  Patient Characteristics: Relapsed or Progressive Disease, Second Line, Relapse < 3 Months Therapeutic Status: Relapsed or Progressive Disease Line of Therapy: Second Line Time to Relapse: Relapse < 3 Months Intent of Therapy: Non-Curative / Palliative Intent, Discussed with Patient

## 2019-07-25 ENCOUNTER — Other Ambulatory Visit: Payer: Self-pay | Admitting: Internal Medicine

## 2019-07-25 ENCOUNTER — Telehealth: Payer: Self-pay | Admitting: Internal Medicine

## 2019-07-25 NOTE — Telephone Encounter (Signed)
Scheduled appt per 9/2 los - spoke with patient and she is aware of appt scheduled on 9/9 and will get an updated schedule when she comes in on that day

## 2019-07-30 ENCOUNTER — Inpatient Hospital Stay: Payer: Medicare Other

## 2019-07-30 ENCOUNTER — Other Ambulatory Visit: Payer: Medicare Other

## 2019-07-30 ENCOUNTER — Inpatient Hospital Stay (HOSPITAL_BASED_OUTPATIENT_CLINIC_OR_DEPARTMENT_OTHER): Payer: Medicare Other | Admitting: Physician Assistant

## 2019-07-30 ENCOUNTER — Encounter: Payer: Self-pay | Admitting: Physician Assistant

## 2019-07-30 ENCOUNTER — Other Ambulatory Visit: Payer: Self-pay

## 2019-07-30 ENCOUNTER — Ambulatory Visit (HOSPITAL_COMMUNITY): Admission: RE | Admit: 2019-07-30 | Payer: Medicare Other | Source: Ambulatory Visit

## 2019-07-30 ENCOUNTER — Ambulatory Visit: Payer: Medicare Other

## 2019-07-30 ENCOUNTER — Ambulatory Visit: Payer: Medicare Other | Admitting: Internal Medicine

## 2019-07-30 VITALS — BP 157/81 | HR 72 | Temp 98.2°F | Resp 17 | Ht 65.0 in | Wt 172.4 lb

## 2019-07-30 DIAGNOSIS — I10 Essential (primary) hypertension: Secondary | ICD-10-CM | POA: Diagnosis not present

## 2019-07-30 DIAGNOSIS — Z5111 Encounter for antineoplastic chemotherapy: Secondary | ICD-10-CM | POA: Diagnosis not present

## 2019-07-30 DIAGNOSIS — C3492 Malignant neoplasm of unspecified part of left bronchus or lung: Secondary | ICD-10-CM | POA: Diagnosis not present

## 2019-07-30 DIAGNOSIS — Z95828 Presence of other vascular implants and grafts: Secondary | ICD-10-CM

## 2019-07-30 LAB — CBC WITH DIFFERENTIAL (CANCER CENTER ONLY)
Abs Immature Granulocytes: 0.02 10*3/uL (ref 0.00–0.07)
Basophils Absolute: 0 10*3/uL (ref 0.0–0.1)
Basophils Relative: 0 %
Eosinophils Absolute: 0 10*3/uL (ref 0.0–0.5)
Eosinophils Relative: 0 %
HCT: 30 % — ABNORMAL LOW (ref 36.0–46.0)
Hemoglobin: 10.4 g/dL — ABNORMAL LOW (ref 12.0–15.0)
Immature Granulocytes: 0 %
Lymphocytes Relative: 9 %
Lymphs Abs: 0.6 10*3/uL — ABNORMAL LOW (ref 0.7–4.0)
MCH: 36.6 pg — ABNORMAL HIGH (ref 26.0–34.0)
MCHC: 34.7 g/dL (ref 30.0–36.0)
MCV: 105.6 fL — ABNORMAL HIGH (ref 80.0–100.0)
Monocytes Absolute: 0.9 10*3/uL (ref 0.1–1.0)
Monocytes Relative: 15 %
Neutro Abs: 4.8 10*3/uL (ref 1.7–7.7)
Neutrophils Relative %: 76 %
Platelet Count: 99 10*3/uL — ABNORMAL LOW (ref 150–400)
RBC: 2.84 MIL/uL — ABNORMAL LOW (ref 3.87–5.11)
RDW: 13.6 % (ref 11.5–15.5)
WBC Count: 6.4 10*3/uL (ref 4.0–10.5)
nRBC: 0 % (ref 0.0–0.2)

## 2019-07-30 LAB — CMP (CANCER CENTER ONLY)
ALT: 49 U/L — ABNORMAL HIGH (ref 0–44)
AST: 77 U/L — ABNORMAL HIGH (ref 15–41)
Albumin: 4.1 g/dL (ref 3.5–5.0)
Alkaline Phosphatase: 170 U/L — ABNORMAL HIGH (ref 38–126)
Anion gap: 10 (ref 5–15)
BUN: 7 mg/dL — ABNORMAL LOW (ref 8–23)
CO2: 25 mmol/L (ref 22–32)
Calcium: 9.4 mg/dL (ref 8.9–10.3)
Chloride: 90 mmol/L — ABNORMAL LOW (ref 98–111)
Creatinine: 0.62 mg/dL (ref 0.44–1.00)
GFR, Est AFR Am: >60 mL/min (ref >60)
GFR, Estimated: >60 mL/min (ref >60)
Glucose, Bld: 101 mg/dL — ABNORMAL HIGH (ref 70–99)
Potassium: 3.7 mmol/L (ref 3.5–5.1)
Sodium: 125 mmol/L — ABNORMAL LOW (ref 135–145)
Total Bilirubin: 0.5 mg/dL (ref 0.3–1.2)
Total Protein: 7.5 g/dL (ref 6.5–8.1)

## 2019-07-30 LAB — MAGNESIUM: Magnesium: 1.5 mg/dL — ABNORMAL LOW (ref 1.7–2.4)

## 2019-07-30 MED ORDER — DEXAMETHASONE SODIUM PHOSPHATE 10 MG/ML IJ SOLN
10.0000 mg | Freq: Once | INTRAMUSCULAR | Status: AC
  Administered 2019-07-30: 10:00:00 10 mg via INTRAVENOUS

## 2019-07-30 MED ORDER — SODIUM CHLORIDE 0.9 % IV SOLN
3.2000 mg/m2 | Freq: Once | INTRAVENOUS | Status: AC
  Administered 2019-07-30: 6.1 mg via INTRAVENOUS
  Filled 2019-07-30: qty 12.2

## 2019-07-30 MED ORDER — MAGNESIUM OXIDE 400 (241.3 MG) MG PO TABS: 400.0000 mg | ORAL_TABLET | Freq: Two times a day (BID) | ORAL | 1 refills | Status: DC

## 2019-07-30 MED ORDER — HEPARIN SOD (PORK) LOCK FLUSH 100 UNIT/ML IV SOLN
500.0000 [IU] | Freq: Once | INTRAVENOUS | Status: AC | PRN
  Administered 2019-07-30: 500 [IU]
  Filled 2019-07-30: qty 5

## 2019-07-30 MED ORDER — SODIUM CHLORIDE 0.9% FLUSH
10.0000 mL | INTRAVENOUS | Status: DC | PRN
  Administered 2019-07-30: 10 mL
  Filled 2019-07-30: qty 10

## 2019-07-30 MED ORDER — PALONOSETRON HCL INJECTION 0.25 MG/5ML
0.2500 mg | Freq: Once | INTRAVENOUS | Status: AC
  Administered 2019-07-30: 0.25 mg via INTRAVENOUS

## 2019-07-30 MED ORDER — DEXAMETHASONE SODIUM PHOSPHATE 10 MG/ML IJ SOLN
INTRAMUSCULAR | Status: AC
  Filled 2019-07-30: qty 1

## 2019-07-30 MED ORDER — SODIUM CHLORIDE 0.9 % IV SOLN
Freq: Once | INTRAVENOUS | Status: AC
  Administered 2019-07-30: 10:00:00 via INTRAVENOUS
  Filled 2019-07-30: qty 250

## 2019-07-30 MED ORDER — PALONOSETRON HCL INJECTION 0.25 MG/5ML
INTRAVENOUS | Status: AC
  Filled 2019-07-30: qty 5

## 2019-07-30 NOTE — Progress Notes (Signed)
Per Dr. Julien Nordmann, okay to treat with PLT 99.

## 2019-07-30 NOTE — Patient Instructions (Signed)
[facility] Discharge Instructions for Patients Receiving Chemotherapy  Today you received the following chemotherapy agents: Zepzelca  To help prevent nausea and vomiting after your treatment, we encourage you to take your nausea medication as directed.   If you develop nausea and vomiting that is not controlled by your nausea medication, call the clinic.   BELOW ARE SYMPTOMS THAT SHOULD BE REPORTED IMMEDIATELY:  *FEVER GREATER THAN 100.5 F  *CHILLS WITH OR WITHOUT FEVER  NAUSEA AND VOMITING THAT IS NOT CONTROLLED WITH YOUR NAUSEA MEDICATION  *UNUSUAL SHORTNESS OF BREATH  *UNUSUAL BRUISING OR BLEEDING  TENDERNESS IN MOUTH AND THROAT WITH OR WITHOUT PRESENCE OF ULCERS  *URINARY PROBLEMS  *BOWEL PROBLEMS  UNUSUAL RASH Items with * indicate a potential emergency and should be followed up as soon as possible.  Feel free to call the clinic should you have any questions or concerns. The clinic phone number is [phone].  Please show the [facility] at check-in to the Emergency Department and triage nurse.

## 2019-07-30 NOTE — Progress Notes (Signed)
[facility] OFFICE PROGRESS NOTE  Jessica Herb, [name] [place] [address]  DIAGNOSIS: Extensive stage (T3, N2, M1a)small cell lung cancer diagnosed in August 2019 and presented with large left hilar/subhilar mass in addition to mediastinal lymphadenopathy and contracture and mass in the right upper lobe.  PRIOR THERAPY: 1)Systemic therapy with carboplatin for AUC of 5 on day 1, etoposide 100 mg/M2 on days 1, 2 and 3 as well as Tecentriq (Atezolizumab) 1200 mg IV every 3 weeks with Neulasta support. Status post 10 cycles. Starting from cycle #7, the patient will be treated with only single agent immunotherapy with Tecentriq 1200 mg IV every 3 weeks. Last dose was given January 27, 2019 discontinued secondary to disease progression. 2)palliative radiotherapy to the mediastinal lymph nodes. 3) Systemic chemotherapy with cisplatin30mg /M2 and irinotecan 65mg /M2 every 3 weeks. Status post3cycles.Her dosewas reduced toCisplatin 25 mg/m2 and irinotecan 50 mg/m2 starting cycle #3. Last dose 06/18/2019.   CURRENT THERAPY: Zepzelca ( Lurbinectedin) 3.2 mg/m2 IV every 3 weeks. First dose expected on 07/30/2019.   INTERVAL HISTORY: [name] 63 y.o. female returns to the clinic today for a follow-up visit.  The patient is feeling fairly well today without any concerning complaints.  She denies any fever, chills, or night sweats. She lost approximately 2 lbs since her visit last week. She denies any nausea, vomiting, diarrhea, or constipation.  She denies any headache or visual changes.  She denies any chest pain, shortness of breath, cough, or hemoptysis.  The patient is here today to start her first cycle of her new chemotherapy with Zepzelca.  She previously treated with a reduced dose of cisplatin and irinotecan. This was discontinued secondary to increased liver lesions as well as persistent electrolyte abnormalities and pancytopenia requiring frequent dose  delays and intravenous KCl and Mg.  She is here today for evaluation and to review her lab work before starting her first cycle of chemotherapy with Zepzelca.   MEDICAL HISTORY: Past Medical History:  Diagnosis Date  . Alpha-1-antitrypsin deficiency ([place])   . Cervical cancer ([place]) 2004  . 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 This doesn't fit entirely with emphysema.      . Exposure to hepatitis C 09/12/2016  . Fibromyalgia   . GERD (gastroesophageal reflux disease)   . Hyperlipidemia   . IBS (irritable bowel syndrome)   . Obstructive sleep apnea 06/02/2008   NPSG 06/07/08, AHI 6.9/ hr, weight 220 lbs    . Osteoporosis   . Pulmonary fibrosis, unspecified ([place]) 09/29/2016   CXR 03/28/2016  . Rhinitis, nonallergic 05/08/2012  . 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 Vitamin D deficiency     ALLERGIES:  is allergic to penicillins; actonel [risedronate sodium]; advair diskus [fluticasone-salmeterol]; alendronate sodium; aspirin; azithromycin; keflex [cephalexin]; levofloxacin; morphine; nsaids; omnicef [cefdinir]; and erythromycin.  MEDICATIONS:  Current Outpatient Medications  Medication Sig Dispense Refill  . allopurinol (ZYLOPRIM) 300 MG tablet Take 1 tablet (300 mg total) by mouth daily. Begin on 07/25/2018 14 tablet 0  . amitriptyline (ELAVIL) 75 MG tablet Take 75 mg by mouth at bedtime.      . benzonatate (TESSALON) 100 MG capsule TAKE 1 CAPSULE THREE TIMES DAILY AS NEEDED FOR COUGH 20 capsule 0  . demeclocycline (DECLOMYCIN) 150 MG tablet TAKE  (1)  TABLET TWICE A DAY. 60 tablet 0  . ezetimibe (ZETIA) 10 MG tablet TAKE 1 TABLET ONCE A DAY 90 tablet 0  . feeding supplement, ENSURE ENLIVE, (ENSURE ENLIVE) LIQD Take 237 mLs by mouth 2 (two) times daily between meals. 60 Bottle 0   . fluticasone (FLONASE) 50 MCG/ACT nasal spray USE 2 SPRAYS IN EACH NOSTRIL ONCE DAILY. 16 g 5  . folic acid (FOLVITE) 1 MG tablet Take 1 mg by mouth daily.    . Glycopyrrolate-Formoterol (BEVESPI AEROSPHERE) 9-4.8 MCG/ACT AERO Inhale 2 puffs into the lungs 2 (two) times daily. 1 Inhaler 12  . HYDROcodone-acetaminophen (NORCO/VICODIN) 5-325 MG tablet Take 1 tablet by mouth 3 (three) times daily. 30 tablet 0  . levothyroxine (SYNTHROID) 75 MCG tablet TAKE (1) TABLET DAILY BE- FORE BREAKFAST. 30 tablet 0  . lidocaine-prilocaine (EMLA) cream Apply 1 application topically as needed. 30 g 0  . omeprazole (PRILOSEC) 40 MG capsule TAKE (1) CAPSULE DAILY 90 capsule 0  . potassium chloride SA (K-DUR) 20 MEQ tablet Take 1 tablet (20 mEq total) by mouth 2 (two) times daily. 14 tablet 0  . PROAIR HFA 108 (90 Base) MCG/ACT inhaler 2 PUFFS EVERY 4 HOURS AS NEEDED FOR WHEEZING 8.5 g 0  . rosuvastatin (CRESTOR) 40 MG tablet Take 0.5 tablets (20 mg total) by mouth daily. 45 tablet 3  . albuterol (PROVENTIL) (2.5 MG/3ML) 0.083% nebulizer solution Take 3 mLs (2.5 mg total) by nebulization every 6 (six) hours as needed. (Patient not taking: Reported on 07/16/2019) 90 mL 4  . magnesium oxide (MAG-OX) 400 (241.3 Mg) MG tablet Take 1 tablet (400 mg total) by mouth 2 (two) times daily. 30 tablet 1  . Nebulizers (COMPRESSOR NEBULIZER) MISC 1 Device by Does not apply route as needed. (Patient not taking: Reported on 07/16/2019) 1 each prn  . prochlorperazine (COMPAZINE) 10 MG tablet Take 1 tablet (10 mg total) by mouth every 6 (six) hours as needed for nausea or vomiting. (Patient not taking: Reported on 06/18/2019) 30 tablet 1   No current facility-administered medications for this visit.    Facility-Administered Medications Ordered in Other Visits  Medication Dose Route Frequency Provider Last Rate Last Dose  . heparin lock flush 100 unit/mL  500 Units Intracatheter Once PRN Curt Bears, [name]      . lurbinectedin  [facility]) 6.1 mg in sodium chloride 0.9 % 250 mL chemo infusion  3.2 mg/m2 (Treatment Plan Recorded) Intravenous Once Curt Bears, [name]      . sodium chloride flush (NS) 0.9 % injection 10 mL  10 mL Intracatheter PRN Curt Bears, [name]        SURGICAL HISTORY:  Past Surgical History:  Procedure Laterality Date  . ABDOMINAL HYSTERECTOMY    . IR IMAGING GUIDED PORT INSERTION  08/02/2018  . LUMBAR DISC SURGERY    . VIDEO BRONCHOSCOPY Bilateral 07/10/2018   Procedure: VIDEO BRONCHOSCOPY WITHOUT FLUORO;  Surgeon: Laurin Coder, [name];  Location: [facility];  Service: Cardiopulmonary;  Laterality: Bilateral;    REVIEW OF SYSTEMS:   Review of Systems  Constitutional: Negative for appetite change, chills, fatigue, fever and unexpected weight change.  HENT:   Negative for mouth sores, nosebleeds, sore throat and trouble swallowing.   Eyes: Negative for eye problems and icterus.  Respiratory: Negative for cough, hemoptysis, shortness of breath and wheezing.   Cardiovascular: Negative for chest pain and leg swelling.  Gastrointestinal: Negative for abdominal pain, constipation, diarrhea, nausea and vomiting.  Genitourinary: Negative for bladder incontinence, difficulty urinating, dysuria,  frequency and hematuria.   Musculoskeletal: Negative for back pain, gait problem, neck pain and neck stiffness.  Skin: Negative for itching and rash.  Neurological: Negative for dizziness, extremity weakness, gait problem, headaches, light-headedness and seizures.  Hematological: Negative for adenopathy. Does not bruise/bleed easily.  Psychiatric/Behavioral: Negative for confusion, depression and sleep disturbance. The patient is not nervous/anxious.     PHYSICAL EXAMINATION:  Blood pressure (!) 157/81, pulse 72, temperature 98.2 F (36.8 C), resp. rate 17, height 5\' 5"  (1.651 m), weight 172 lb 6.4 oz (78.2 kg), SpO2 97 %.  ECOG PERFORMANCE STATUS: 1 - Symptomatic but completely ambulatory  Physical  Exam Constitutional: Oriented to person, place, and time and well-developed, well-nourished, and in no distress.  HENT:  Head: Normocephalic and atraumatic.  Mouth/Throat: Oropharynx is clear and moist. No oropharyngeal exudate.  Eyes: Conjunctivae are normal. Right eye exhibits no discharge. Left eye exhibits no discharge. No scleral icterus.  Neck: Normal range of motion. Neck supple.  Cardiovascular: Normal rate, regular rhythm, normal heart sounds and intact distal pulses.  Pulmonary/Chest:Crackles noted bilaterally.Effort normal and breath sounds normal. No respiratory distress. No wheezes.  Abdominal: Soft. Bowel sounds are normal. Exhibits no distension and no mass. There is no tenderness.  Musculoskeletal: Normal range of motion. Exhibits no edema.  Lymphadenopathy:  No cervical adenopathy.  Neurological: Alert and oriented to person, place, and time. Exhibits normal muscle tone. Gait normal. Coordination normal.  Skin: Skin is warm and dry. No rash noted. Not diaphoretic. No erythema. No pallor.  Psychiatric: Mood, memory and judgment normal.  Vitals reviewed.  LABORATORY DATA: Lab Results  Component Value Date   WBC 6.4 07/30/2019   HGB 10.4 (L) 07/30/2019   HCT 30.0 (L) 07/30/2019   MCV 105.6 (H) 07/30/2019   PLT 99 (L) 07/30/2019      Chemistry      Component Value Date/Time   NA 125 (L) 07/30/2019 0838   NA 124 (L) 07/16/2018 0853   K 3.7 07/30/2019 0838   CL 90 (L) 07/30/2019 0838   CO2 25 07/30/2019 0838   BUN 7 (L) 07/30/2019 0838   BUN 8 07/16/2018 0853   CREATININE 0.62 07/30/2019 0838   CREATININE 0.68 02/13/2013 1523      Component Value Date/Time   CALCIUM 9.4 07/30/2019 0838   ALKPHOS 170 (H) 07/30/2019 0838   AST 77 (H) 07/30/2019 0838   ALT 49 (H) 07/30/2019 0838   BILITOT 0.5 07/30/2019 0838       RADIOGRAPHIC STUDIES:  Ct Chest W Contrast  Result Date: 07/23/2019 CLINICAL DATA:  Extensive stage small cell lung cancer. EXAM: CT  CHEST, ABDOMEN, AND PELVIS WITH CONTRAST TECHNIQUE: Multidetector CT imaging of the chest, abdomen and pelvis was performed following the standard protocol during bolus administration of intravenous contrast. CONTRAST:  157mL OMNIPAQUE IOHEXOL 300 MG/ML  SOLN COMPARISON:  02/14/2019 FINDINGS: CT CHEST FINDINGS Cardiovascular: The heart size appears within normal limits. Aortic atherosclerosis. Lad and RCA coronary artery calcifications. No pericardial effusion. Mediastinum/Nodes: Thyroid gland is either atrophic or surgically absent. The trachea appears patent and is midline. Normal appearance of the esophagus. Left paratracheal lymph node measures 1.2 cm, image 20/2. Previously this measured the same. The index left hilar lesion measures 0.9 x 0.9 cm, image 27/2. Previously 1.1 by 1.0 cm. No new or enlarging mediastinal or hilar lymph nodes. Lungs/Pleura: No pleural effusion. Centrilobular emphysema. Geographic area of fibrosis, ground-glass attenuation and architectural distortion is identified within the left mid lung compatible with changes secondary  to external beam radiation. Irregular density in the right base and posterior row medial right lower lobe identified, image 99/6. New from previous exam, favored to represent postinflammatory change. No suspicious pulmonary nodule or mass. Musculoskeletal: No chest wall mass or suspicious bone lesions identified. CT ABDOMEN PELVIS FINDINGS Hepatobiliary: The liver appears cirrhotic. Faint, ill-defined liver lesions are again identified scattered throughout both lobes. Index lesion within lateral segment of left lobe measures 2.1 cm, image 59/2. Previously 1.3 cm. The index lesion within lateral segment of left lobe of liver measures 0.9 cm, image 63/2. Unchanged. Also in left lobe of liver is a 1.9 cm low-density lesion, image 58/2. Previously 1 cm. Previously measured central right lobe of liver lesion is not confidently seen on today's study. Gallbladder normal.  No biliary dilatation. Pancreas: Unremarkable. No pancreatic ductal dilatation or surrounding inflammatory changes. Spleen: Normal in size without focal abnormality. Adrenals/Urinary Tract: Normal appearance of the adrenal glands. No kidney mass or hydronephrosis. The urinary bladder is unremarkable. Stomach/Bowel: The small bowel loops are nondilated. The appendix is visualized and appears normal. No pathologic dilatation of the colon. Vascular/Lymphatic: Aortic atherosclerosis. No aneurysm. The index portacaval node measures 1.2 cm, image 61/2. Unchanged. Left periaortic node measures 1.2 cm, image 56/2. Unchanged. Previous pelvic node dissection. Reproductive: Status post hysterectomy.  No adnexal mass. Other: No ascites or focal fluid collections. Musculoskeletal: Increased sclerosis and right femoral head compatible with AVN.Previous interbody fusion of L4-5. No acute or suspicious osseous findings. IMPRESSION: 1. Stable appearance of the chest. Small residual soft tissue in the left perihilar region is unchanged from prior study. 2. Multiple hepatic lesions within a background of cirrhosis are again noted. Lesions are ill-defined and difficult to measure by CT. There are several low-density lesions in left lobe of liver which appear increased in size in the interval. Other lesions are unchanged or less conspicuous. 3. Unchanged borderline upper abdominal adenopathy. 4. Cirrhosis 5. Aortic Atherosclerosis (ICD10-I70.0) and Emphysema (ICD10-J43.9). Coronary artery calcifications. Electronically Signed   By: Kerby Moors M.D.   On: 07/23/2019 09:02   Ct Abdomen Pelvis W Contrast  Result Date: 07/23/2019 CLINICAL DATA:  Extensive stage small cell lung cancer. EXAM: CT CHEST, ABDOMEN, AND PELVIS WITH CONTRAST TECHNIQUE: Multidetector CT imaging of the chest, abdomen and pelvis was performed following the standard protocol during bolus administration of intravenous contrast. CONTRAST:  131mL OMNIPAQUE IOHEXOL  300 MG/ML  SOLN COMPARISON:  02/14/2019 FINDINGS: CT CHEST FINDINGS Cardiovascular: The heart size appears within normal limits. Aortic atherosclerosis. Lad and RCA coronary artery calcifications. No pericardial effusion. Mediastinum/Nodes: Thyroid gland is either atrophic or surgically absent. The trachea appears patent and is midline. Normal appearance of the esophagus. Left paratracheal lymph node measures 1.2 cm, image 20/2. Previously this measured the same. The index left hilar lesion measures 0.9 x 0.9 cm, image 27/2. Previously 1.1 by 1.0 cm. No new or enlarging mediastinal or hilar lymph nodes. Lungs/Pleura: No pleural effusion. Centrilobular emphysema. Geographic area of fibrosis, ground-glass attenuation and architectural distortion is identified within the left mid lung compatible with changes secondary to external beam radiation. Irregular density in the right base and posterior row medial right lower lobe identified, image 99/6. New from previous exam, favored to represent postinflammatory change. No suspicious pulmonary nodule or mass. Musculoskeletal: No chest wall mass or suspicious bone lesions identified. CT ABDOMEN PELVIS FINDINGS Hepatobiliary: The liver appears cirrhotic. Faint, ill-defined liver lesions are again identified scattered throughout both lobes. Index lesion within lateral segment of left lobe measures  2.1 cm, image 59/2. Previously 1.3 cm. The index lesion within lateral segment of left lobe of liver measures 0.9 cm, image 63/2. Unchanged. Also in left lobe of liver is a 1.9 cm low-density lesion, image 58/2. Previously 1 cm. Previously measured central right lobe of liver lesion is not confidently seen on today's study. Gallbladder normal. No biliary dilatation. Pancreas: Unremarkable. No pancreatic ductal dilatation or surrounding inflammatory changes. Spleen: Normal in size without focal abnormality. Adrenals/Urinary Tract: Normal appearance of the adrenal glands. No kidney mass  or hydronephrosis. The urinary bladder is unremarkable. Stomach/Bowel: The small bowel loops are nondilated. The appendix is visualized and appears normal. No pathologic dilatation of the colon. Vascular/Lymphatic: Aortic atherosclerosis. No aneurysm. The index portacaval node measures 1.2 cm, image 61/2. Unchanged. Left periaortic node measures 1.2 cm, image 56/2. Unchanged. Previous pelvic node dissection. Reproductive: Status post hysterectomy.  No adnexal mass. Other: No ascites or focal fluid collections. Musculoskeletal: Increased sclerosis and right femoral head compatible with AVN.Previous interbody fusion of L4-5. No acute or suspicious osseous findings. IMPRESSION: 1. Stable appearance of the chest. Small residual soft tissue in the left perihilar region is unchanged from prior study. 2. Multiple hepatic lesions within a background of cirrhosis are again noted. Lesions are ill-defined and difficult to measure by CT. There are several low-density lesions in left lobe of liver which appear increased in size in the interval. Other lesions are unchanged or less conspicuous. 3. Unchanged borderline upper abdominal adenopathy. 4. Cirrhosis 5. Aortic Atherosclerosis (ICD10-I70.0) and Emphysema (ICD10-J43.9). Coronary artery calcifications. Electronically Signed   By: Kerby Moors M.D.   On: 07/23/2019 09:02     ASSESSMENT/PLAN:  This is a very pleasant 63 year old Caucasian female diagnosed with extensive stage small cell lung cancer. She presented with a large left perihilar mass as well as mediastinal lymphadenopathy as well as metastatic disease to the liver and bone. She wasdiagnosed in August 2019.  The patient was previously treated with systemic chemotherapy with carboplatin, etoposide, and Tecentriq. She is status post 6 cycles. She then was treated withsingle agent maintenance Tecentriq for 4 cycles. This was discontinued due to evidence of disease progression.  She underwent palliative  radiotherapy to the progressive disease involving the lung and mediastinum  She recently was treated with cisplatin at a reduced dose of 25 mg/m andirinotecan50 mg/m on days 1 and 8 every 3 weeks. She is status post3cycles. This was discontinued recently secondary to pancytopenia and electrolyte abnormalities requiring frequent dose delays. She also had evidence of increased liver lesions on her most recent restaging CT scan.   She is currently undergoing treatment with Zepzelca IV every 3 weeks. She is scheduled for her first cycle today.   The patient was seen with Dr. Julien Nordmann. Labs were reviewed and were adequate for treatment. We will continue to monitor her labs closely every week.   We recommend that the patient proceed with cycle #1 today as scheduled.   We will see her back for a follow up visit in 3 weeks for evaluation before starting cycle #2.   The patient will continue taking demeclocycline for her hyponatremia and follow her fluid restriction. She also will continue to eat a diet rich in potassium.  I also have sent a prescription for magnesium oxide 400 mg BID to her pharmacy.   The patient's blood pressure was noted to be elevated today. She states that she is nervous about her first treatment today. She monitors her BP at home and states that it  is within normal limits. She will continue to monitor her BP at home and will seek medical evaluation if it continues to be high for discussion of initiating anti-hypertensive medications.   No orders of the defined types were placed in this encounter.    Christopherjame Carnell L Ayeshia Coppin, PA-C 07/30/19  ADDENDUM: Hematology/Oncology Attending: I had a face-to-face encounter with the patient today.  I recommended her care plan.  This is a very pleasant 63 years old white female with extensive stage small cell lung cancer status post induction systemic chemotherapy with carboplatin, etoposide and Tecentriq with initial partial response  followed by disease progression.  The patient was also treated with second line chemotherapy with reduced dose cisplatin and irinotecan but she has a rough time with this treatment and could not tolerate it with significant electrolyte abnormalities as well as pancytopenia. I recommended for the patient treatment with Lurbinectedin 3.2 mg/M2 every 3 weeks. The patient is here today to start the first cycle of this treatment.  She was advised about the possible adverse effect of this treatment.  She would like to proceed with her treatment today as planned. She will come back for follow-up visit in 3 weeks for evaluation and management of any adverse effect of her treatment. For the hypomagnesemia, will arrange for the patient to receive 2 g of magnesium sulfate intravenously today.  She was also advised to continue with oral magnesium oxide. The patient was advised to call immediately if she has any other concerning symptoms in the interval.  Disclaimer: This note was dictated with voice recognition software. Similar sounding words can inadvertently be transcribed and may be missed upon review. Eilleen Kempf, [name] 07/30/19

## 2019-08-04 ENCOUNTER — Other Ambulatory Visit: Payer: Self-pay | Admitting: Family Medicine

## 2019-08-05 ENCOUNTER — Other Ambulatory Visit: Payer: Self-pay | Admitting: Internal Medicine

## 2019-08-06 ENCOUNTER — Other Ambulatory Visit: Payer: Medicare Other

## 2019-08-06 ENCOUNTER — Ambulatory Visit: Payer: Medicare Other

## 2019-08-06 ENCOUNTER — Other Ambulatory Visit: Payer: Self-pay

## 2019-08-06 ENCOUNTER — Ambulatory Visit: Payer: Medicare Other | Admitting: Internal Medicine

## 2019-08-06 ENCOUNTER — Inpatient Hospital Stay: Payer: Medicare Other

## 2019-08-06 DIAGNOSIS — C3492 Malignant neoplasm of unspecified part of left bronchus or lung: Secondary | ICD-10-CM

## 2019-08-06 DIAGNOSIS — Z5111 Encounter for antineoplastic chemotherapy: Secondary | ICD-10-CM | POA: Diagnosis not present

## 2019-08-06 LAB — CBC WITH DIFFERENTIAL (CANCER CENTER ONLY)
Abs Immature Granulocytes: 0.03 10*3/uL (ref 0.00–0.07)
Basophils Absolute: 0 10*3/uL (ref 0.0–0.1)
Basophils Relative: 0 %
Eosinophils Absolute: 0.1 10*3/uL (ref 0.0–0.5)
Eosinophils Relative: 2 %
HCT: 30.5 % — ABNORMAL LOW (ref 36.0–46.0)
Hemoglobin: 10.4 g/dL — ABNORMAL LOW (ref 12.0–15.0)
Immature Granulocytes: 1 %
Lymphocytes Relative: 17 %
Lymphs Abs: 0.8 10*3/uL (ref 0.7–4.0)
MCH: 36.5 pg — ABNORMAL HIGH (ref 26.0–34.0)
MCHC: 34.1 g/dL (ref 30.0–36.0)
MCV: 107 fL — ABNORMAL HIGH (ref 80.0–100.0)
Monocytes Absolute: 0.4 10*3/uL (ref 0.1–1.0)
Monocytes Relative: 9 %
Neutro Abs: 3.4 10*3/uL (ref 1.7–7.7)
Neutrophils Relative %: 71 %
Platelet Count: 70 10*3/uL — ABNORMAL LOW (ref 150–400)
RBC: 2.85 MIL/uL — ABNORMAL LOW (ref 3.87–5.11)
RDW: 14.1 % (ref 11.5–15.5)
WBC Count: 4.7 10*3/uL (ref 4.0–10.5)
nRBC: 0 % (ref 0.0–0.2)

## 2019-08-06 LAB — CMP (CANCER CENTER ONLY)
ALT: 43 U/L (ref 0–44)
AST: 51 U/L — ABNORMAL HIGH (ref 15–41)
Albumin: 3.5 g/dL (ref 3.5–5.0)
Alkaline Phosphatase: 208 U/L — ABNORMAL HIGH (ref 38–126)
Anion gap: 7 (ref 5–15)
BUN: 7 mg/dL — ABNORMAL LOW (ref 8–23)
CO2: 31 mmol/L (ref 22–32)
Calcium: 9 mg/dL (ref 8.9–10.3)
Chloride: 91 mmol/L — ABNORMAL LOW (ref 98–111)
Creatinine: 0.71 mg/dL (ref 0.44–1.00)
GFR, Est AFR Am: >60 mL/min (ref >60)
GFR, Estimated: >60 mL/min (ref >60)
Glucose, Bld: 101 mg/dL — ABNORMAL HIGH (ref 70–99)
Potassium: 3.3 mmol/L — ABNORMAL LOW (ref 3.5–5.1)
Sodium: 129 mmol/L — ABNORMAL LOW (ref 135–145)
Total Bilirubin: 0.3 mg/dL (ref 0.3–1.2)
Total Protein: 7 g/dL (ref 6.5–8.1)

## 2019-08-06 LAB — MAGNESIUM: Magnesium: 1.8 mg/dL (ref 1.7–2.4)

## 2019-08-13 ENCOUNTER — Other Ambulatory Visit: Payer: Self-pay | Admitting: Physician Assistant

## 2019-08-13 ENCOUNTER — Other Ambulatory Visit: Payer: Medicare Other

## 2019-08-13 ENCOUNTER — Other Ambulatory Visit: Payer: Self-pay

## 2019-08-13 ENCOUNTER — Telehealth: Payer: Self-pay | Admitting: *Deleted

## 2019-08-13 ENCOUNTER — Telehealth: Payer: Self-pay | Admitting: Physician Assistant

## 2019-08-13 ENCOUNTER — Other Ambulatory Visit: Payer: Self-pay | Admitting: Internal Medicine

## 2019-08-13 ENCOUNTER — Inpatient Hospital Stay: Payer: Medicare Other

## 2019-08-13 ENCOUNTER — Ambulatory Visit: Payer: Medicare Other

## 2019-08-13 DIAGNOSIS — E876 Hypokalemia: Secondary | ICD-10-CM

## 2019-08-13 DIAGNOSIS — Z5111 Encounter for antineoplastic chemotherapy: Secondary | ICD-10-CM | POA: Diagnosis not present

## 2019-08-13 DIAGNOSIS — C3492 Malignant neoplasm of unspecified part of left bronchus or lung: Secondary | ICD-10-CM

## 2019-08-13 LAB — CBC WITH DIFFERENTIAL (CANCER CENTER ONLY)
Abs Immature Granulocytes: 0.02 K/uL (ref 0.00–0.07)
Basophils Absolute: 0 K/uL (ref 0.0–0.1)
Basophils Relative: 0 %
Eosinophils Absolute: 0.1 K/uL (ref 0.0–0.5)
Eosinophils Relative: 1 %
HCT: 29.8 % — ABNORMAL LOW (ref 36.0–46.0)
Hemoglobin: 10.4 g/dL — ABNORMAL LOW (ref 12.0–15.0)
Immature Granulocytes: 1 %
Lymphocytes Relative: 17 %
Lymphs Abs: 0.7 K/uL (ref 0.7–4.0)
MCH: 36.4 pg — ABNORMAL HIGH (ref 26.0–34.0)
MCHC: 34.9 g/dL (ref 30.0–36.0)
MCV: 104.2 fL — ABNORMAL HIGH (ref 80.0–100.0)
Monocytes Absolute: 0.5 K/uL (ref 0.1–1.0)
Monocytes Relative: 11 %
Neutro Abs: 3 K/uL (ref 1.7–7.7)
Neutrophils Relative %: 70 %
Platelet Count: 73 K/uL — ABNORMAL LOW (ref 150–400)
RBC: 2.86 MIL/uL — ABNORMAL LOW (ref 3.87–5.11)
RDW: 13.5 % (ref 11.5–15.5)
WBC Count: 4.2 K/uL (ref 4.0–10.5)
nRBC: 0 % (ref 0.0–0.2)

## 2019-08-13 LAB — CMP (CANCER CENTER ONLY)
ALT: 32 U/L (ref 0–44)
AST: 47 U/L — ABNORMAL HIGH (ref 15–41)
Albumin: 3.6 g/dL (ref 3.5–5.0)
Alkaline Phosphatase: 218 U/L — ABNORMAL HIGH (ref 38–126)
Anion gap: 10 (ref 5–15)
BUN: 7 mg/dL — ABNORMAL LOW (ref 8–23)
CO2: 27 mmol/L (ref 22–32)
Calcium: 9 mg/dL (ref 8.9–10.3)
Chloride: 91 mmol/L — ABNORMAL LOW (ref 98–111)
Creatinine: 0.72 mg/dL (ref 0.44–1.00)
GFR, Est AFR Am: >60 mL/min (ref >60)
GFR, Estimated: >60 mL/min (ref >60)
Glucose, Bld: 120 mg/dL — ABNORMAL HIGH (ref 70–99)
Potassium: 3 mmol/L — CL (ref 3.5–5.1)
Sodium: 128 mmol/L — ABNORMAL LOW (ref 135–145)
Total Bilirubin: 0.4 mg/dL (ref 0.3–1.2)
Total Protein: 7.1 g/dL (ref 6.5–8.1)

## 2019-08-13 LAB — MAGNESIUM: Magnesium: 1.5 mg/dL — ABNORMAL LOW (ref 1.7–2.4)

## 2019-08-13 MED ORDER — POTASSIUM CHLORIDE CRYS ER 20 MEQ PO TBCR: 20.0000 meq | EXTENDED_RELEASE_TABLET | Freq: Two times a day (BID) | ORAL | 0 refills | Status: AC

## 2019-08-13 NOTE — Telephone Encounter (Signed)
[name] notified of potassium 3.0

## 2019-08-13 NOTE — Telephone Encounter (Signed)
Called and left a voicemail. I was calling regarding her lab work today and instructed her to call us back so that we can communicate to her that I have sent a prescription for potassium chloride to her pharmacy for 7 days. Also, I was going to instruct the patient to take 2 400 mg tablets of magnesium oxide.

## 2019-08-14 ENCOUNTER — Telehealth: Payer: Self-pay | Admitting: *Deleted

## 2019-08-14 NOTE — Telephone Encounter (Signed)
Called pt to instruct her p/u Rx for K+, continue to take magnesium as prescribed. No further concerns.

## 2019-08-20 ENCOUNTER — Other Ambulatory Visit: Payer: Medicare Other

## 2019-08-20 ENCOUNTER — Other Ambulatory Visit: Payer: Self-pay

## 2019-08-20 ENCOUNTER — Inpatient Hospital Stay: Payer: Medicare Other

## 2019-08-20 ENCOUNTER — Inpatient Hospital Stay (HOSPITAL_BASED_OUTPATIENT_CLINIC_OR_DEPARTMENT_OTHER): Payer: Medicare Other | Admitting: Physician Assistant

## 2019-08-20 VITALS — BP 163/94 | HR 80 | Temp 100.1°F | Resp 17 | Ht 65.0 in | Wt 174.4 lb

## 2019-08-20 DIAGNOSIS — C3492 Malignant neoplasm of unspecified part of left bronchus or lung: Secondary | ICD-10-CM

## 2019-08-20 DIAGNOSIS — Z5111 Encounter for antineoplastic chemotherapy: Secondary | ICD-10-CM | POA: Diagnosis not present

## 2019-08-20 DIAGNOSIS — Z95828 Presence of other vascular implants and grafts: Secondary | ICD-10-CM

## 2019-08-20 DIAGNOSIS — E871 Hypo-osmolality and hyponatremia: Secondary | ICD-10-CM

## 2019-08-20 DIAGNOSIS — Z8589 Personal history of malignant neoplasm of other organs and systems: Secondary | ICD-10-CM

## 2019-08-20 DIAGNOSIS — Z85841 Personal history of malignant neoplasm of brain: Secondary | ICD-10-CM

## 2019-08-20 LAB — CMP (CANCER CENTER ONLY)
ALT: 52 U/L — ABNORMAL HIGH (ref 0–44)
AST: 70 U/L — ABNORMAL HIGH (ref 15–41)
Albumin: 3.7 g/dL (ref 3.5–5.0)
Alkaline Phosphatase: 227 U/L — ABNORMAL HIGH (ref 38–126)
Anion gap: 9 (ref 5–15)
BUN: 8 mg/dL (ref 8–23)
CO2: 26 mmol/L (ref 22–32)
Calcium: 8.8 mg/dL — ABNORMAL LOW (ref 8.9–10.3)
Chloride: 87 mmol/L — ABNORMAL LOW (ref 98–111)
Creatinine: 0.7 mg/dL (ref 0.44–1.00)
GFR, Est AFR Am: >60 mL/min (ref >60)
GFR, Estimated: >60 mL/min (ref >60)
Glucose, Bld: 103 mg/dL — ABNORMAL HIGH (ref 70–99)
Potassium: 3.7 mmol/L (ref 3.5–5.1)
Sodium: 122 mmol/L — ABNORMAL LOW (ref 135–145)
Total Bilirubin: 0.4 mg/dL (ref 0.3–1.2)
Total Protein: 7.1 g/dL (ref 6.5–8.1)

## 2019-08-20 LAB — CBC WITH DIFFERENTIAL (CANCER CENTER ONLY)
Abs Immature Granulocytes: 0.03 10*3/uL (ref 0.00–0.07)
Basophils Absolute: 0 10*3/uL (ref 0.0–0.1)
Basophils Relative: 0 %
Eosinophils Absolute: 0 10*3/uL (ref 0.0–0.5)
Eosinophils Relative: 0 %
HCT: 29.7 % — ABNORMAL LOW (ref 36.0–46.0)
Hemoglobin: 10.6 g/dL — ABNORMAL LOW (ref 12.0–15.0)
Immature Granulocytes: 1 %
Lymphocytes Relative: 10 %
Lymphs Abs: 0.6 10*3/uL — ABNORMAL LOW (ref 0.7–4.0)
MCH: 37.2 pg — ABNORMAL HIGH (ref 26.0–34.0)
MCHC: 35.7 g/dL (ref 30.0–36.0)
MCV: 104.2 fL — ABNORMAL HIGH (ref 80.0–100.0)
Monocytes Absolute: 1.1 10*3/uL — ABNORMAL HIGH (ref 0.1–1.0)
Monocytes Relative: 19 %
Neutro Abs: 4 10*3/uL (ref 1.7–7.7)
Neutrophils Relative %: 70 %
Platelet Count: 116 10*3/uL — ABNORMAL LOW (ref 150–400)
RBC: 2.85 MIL/uL — ABNORMAL LOW (ref 3.87–5.11)
RDW: 13.4 % (ref 11.5–15.5)
WBC Count: 5.7 10*3/uL (ref 4.0–10.5)
nRBC: 0 % (ref 0.0–0.2)

## 2019-08-20 LAB — MAGNESIUM: Magnesium: 1.6 mg/dL — ABNORMAL LOW (ref 1.7–2.4)

## 2019-08-20 MED ORDER — SODIUM CHLORIDE 0.9 % IV SOLN
3.2000 mg/m2 | Freq: Once | INTRAVENOUS | Status: AC
  Administered 2019-08-20: 6.1 mg via INTRAVENOUS
  Filled 2019-08-20: qty 12.2

## 2019-08-20 MED ORDER — DEXAMETHASONE SODIUM PHOSPHATE 10 MG/ML IJ SOLN
INTRAMUSCULAR | Status: AC
  Filled 2019-08-20: qty 1

## 2019-08-20 MED ORDER — PALONOSETRON HCL INJECTION 0.25 MG/5ML
INTRAVENOUS | Status: AC
  Filled 2019-08-20: qty 5

## 2019-08-20 MED ORDER — MAGNESIUM SULFATE 2 GM/50ML IV SOLN
2.0000 g | Freq: Once | INTRAVENOUS | Status: AC
  Administered 2019-08-20: 2 g via INTRAVENOUS
  Filled 2019-08-20: qty 50

## 2019-08-20 MED ORDER — SODIUM CHLORIDE 0.9% FLUSH
10.0000 mL | INTRAVENOUS | Status: DC | PRN
  Administered 2019-08-20: 17:00:00 10 mL
  Filled 2019-08-20: qty 10

## 2019-08-20 MED ORDER — DEXAMETHASONE SODIUM PHOSPHATE 10 MG/ML IJ SOLN
10.0000 mg | Freq: Once | INTRAMUSCULAR | Status: AC
  Administered 2019-08-20: 13:00:00 10 mg via INTRAVENOUS

## 2019-08-20 MED ORDER — HEPARIN SOD (PORK) LOCK FLUSH 100 UNIT/ML IV SOLN
500.0000 [IU] | Freq: Once | INTRAVENOUS | Status: AC | PRN
  Administered 2019-08-20: 500 [IU]
  Filled 2019-08-20: qty 5

## 2019-08-20 MED ORDER — SODIUM CHLORIDE 0.9% FLUSH
10.0000 mL | INTRAVENOUS | Status: DC | PRN
  Administered 2019-08-20: 10 mL
  Filled 2019-08-20: qty 10

## 2019-08-20 MED ORDER — PALONOSETRON HCL INJECTION 0.25 MG/5ML
0.2500 mg | Freq: Once | INTRAVENOUS | Status: AC
  Administered 2019-08-20: 0.25 mg via INTRAVENOUS

## 2019-08-20 MED ORDER — SODIUM CHLORIDE 0.9 % IV SOLN
Freq: Once | INTRAVENOUS | Status: AC
  Administered 2019-08-20: 13:00:00 via INTRAVENOUS
  Filled 2019-08-20: qty 250

## 2019-08-20 MED ORDER — SODIUM CHLORIDE 0.9 % IV SOLN: Freq: Once | INTRAVENOUS | Status: DC

## 2019-08-20 NOTE — Patient Instructions (Signed)
[facility] Discharge Instructions for Patients Receiving Chemotherapy  Today you received the following chemotherapy agents: Zepzelca  To help prevent nausea and vomiting after your treatment, we encourage you to take your nausea medication as directed.   If you develop nausea and vomiting that is not controlled by your nausea medication, call the clinic.   BELOW ARE SYMPTOMS THAT SHOULD BE REPORTED IMMEDIATELY:  *FEVER GREATER THAN 100.5 F  *CHILLS WITH OR WITHOUT FEVER  NAUSEA AND VOMITING THAT IS NOT CONTROLLED WITH YOUR NAUSEA MEDICATION  *UNUSUAL SHORTNESS OF BREATH  *UNUSUAL BRUISING OR BLEEDING  TENDERNESS IN MOUTH AND THROAT WITH OR WITHOUT PRESENCE OF ULCERS  *URINARY PROBLEMS  *BOWEL PROBLEMS  UNUSUAL RASH Items with * indicate a potential emergency and should be followed up as soon as possible.  Feel free to call the clinic should you have any questions or concerns. The clinic phone number is [phone].  Please show the [facility] at check-in to the Emergency Department and triage nurse.

## 2019-08-20 NOTE — Progress Notes (Signed)
Okay to treat with Na 122 per Dr. Julien Nordmann and patient stated that [facility] is increasing her dose of sodium tablets to take at home.

## 2019-08-20 NOTE — Progress Notes (Signed)
[facility] OFFICE PROGRESS NOTE  [name], DO [place] [address]  DIAGNOSIS: Extensive stage (T3, N2, M1a)small cell lung cancer diagnosed in August 2019 and presented with large left hilar/subhilar mass in addition to mediastinal lymphadenopathy and contracture and mass in the right upper lobe  PRIOR THERAPY: 1)Systemic therapy with carboplatin for AUC of 5 on day 1, etoposide 100 mg/M2 on days 1, 2 and 3 as well as Tecentriq (Atezolizumab) 1200 mg IV every 3 weeks with Neulasta support. Status post 10 cycles. Starting from cycle #7, the patient will be treated with only single agent immunotherapy with Tecentriq 1200 mg IV every 3 weeks. Last dose was given January 27, 2019 discontinued secondary to disease progression. 2)palliative radiotherapy to the mediastinal lymph nodes. 3)Systemic chemotherapy with cisplatin30mg /M2 and irinotecan 65mg /M2 every 3 weeks. Status post3cycles.Her dosewas reduced toCisplatin 25 mg/m2 and irinotecan 50 mg/m2 starting cycle #3.Last dose 06/18/2019.  CURRENT THERAPY: Zepzelca ( Lurbinectedin) 3.2 mg/m2 IV every 3 weeks. First dose on 07/30/2019. Status post 1 cycle.   INTERVAL HISTORY: [name] 63 y.o. female returns to the clinic for a follow up visit. The patient is feeling well today without any concerning complaints. The patient continues to tolerate treatment with Zepzelca well without any adverse effects. Denies any fever, chills, night sweats, or weight loss. Denies any chest pain, shortness of breath, cough, or hemoptysis. Denies any nausea, vomiting, diarrhea, or constipation. The patient has a history of metastatic disease to the brain for which she completed radiation in May 2020. The patient notes that she has a mild headache occasionally across her forehead which is relieved with tylenol. She describes her headaches as achy. She denies any seizures or visual changes.  The patient recently  completed her oral potassium supplements. She takes declomycn for her hyponatremia and she states that she tries to follow her fluid restriction. The patient is here today for evaluation prior to starting cycle # 2  MEDICAL HISTORY: Past Medical History:  Diagnosis Date  . Alpha-1-antitrypsin deficiency ([place])   . Cervical cancer ([place]) 2004  . 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 This doesn't fit entirely with emphysema.      . Exposure to hepatitis C 09/12/2016  . Fibromyalgia   . GERD (gastroesophageal reflux disease)   . Hyperlipidemia   . IBS (irritable bowel syndrome)   . Obstructive sleep apnea 06/02/2008   NPSG 06/07/08, AHI 6.9/ hr, weight 220 lbs    . Osteoporosis   . Pulmonary fibrosis, unspecified ([place]) 09/29/2016   CXR 03/28/2016  . Rhinitis, nonallergic 05/08/2012  . 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 Vitamin D deficiency     ALLERGIES:  is allergic to penicillins; actonel [risedronate sodium]; advair diskus [fluticasone-salmeterol]; alendronate sodium; aspirin; azithromycin; keflex [cephalexin]; levofloxacin; morphine; nsaids; omnicef [cefdinir]; and erythromycin.  MEDICATIONS:  Current Outpatient Medications  Medication Sig Dispense Refill  . allopurinol (ZYLOPRIM) 300 MG tablet Take 1 tablet (300 mg total) by mouth daily. Begin on 07/25/2018 14 tablet 0  . amitriptyline (ELAVIL) 75 MG tablet Take 75 mg by mouth at bedtime.      . benzonatate (TESSALON) 100 MG capsule TAKE 1 CAPSULE THREE TIMES DAILY AS NEEDED FOR COUGH 20 capsule 0  . demeclocycline (DECLOMYCIN) 150 MG tablet TAKE  (  1)  TABLET TWICE A DAY. 60 tablet 0  . ezetimibe (ZETIA) 10 MG tablet TAKE 1 TABLET ONCE A DAY 90 tablet 0  . feeding supplement, ENSURE ENLIVE, (ENSURE ENLIVE) LIQD Take 237 mLs by  mouth 2 (two) times daily between meals. 60 Bottle 0  . fluticasone (FLONASE) 50 MCG/ACT nasal spray USE 2 SPRAYS IN EACH NOSTRIL ONCE DAILY. 16 g 5  . folic acid (FOLVITE) 1 MG tablet Take 1 mg by mouth daily.    Marland Kitchen HYDROcodone-acetaminophen (NORCO/VICODIN) 5-325 MG tablet Take 1 tablet by mouth 3 (three) times daily. 30 tablet 0  . levothyroxine (SYNTHROID) 75 MCG tablet TAKE (1) TABLET DAILY BE- FORE BREAKFAST. 30 tablet 0  . lidocaine-prilocaine (EMLA) cream Apply 1 application topically as needed. 30 g 0  . magnesium oxide (MAG-OX) 400 (241.3 Mg) MG tablet Take 1 tablet (400 mg total) by mouth 2 (two) times daily. 30 tablet 1  . omeprazole (PRILOSEC) 40 MG capsule TAKE (1) CAPSULE DAILY 90 capsule 0  . potassium chloride SA (K-DUR) 20 MEQ tablet Take 1 tablet (20 mEq total) by mouth 2 (two) times daily. 14 tablet 0  . rosuvastatin (CRESTOR) 40 MG tablet Take 0.5 tablets (20 mg total) by mouth daily. 45 tablet 3  . albuterol (PROVENTIL) (2.5 MG/3ML) 0.083% nebulizer solution Take 3 mLs (2.5 mg total) by nebulization every 6 (six) hours as needed. (Patient not taking: Reported on 07/16/2019) 90 mL 4  . Glycopyrrolate-Formoterol (BEVESPI AEROSPHERE) 9-4.8 MCG/ACT AERO Inhale 2 puffs into the lungs 2 (two) times daily. (Patient not taking: Reported on 08/20/2019) 1 Inhaler 12  . Nebulizers (COMPRESSOR NEBULIZER) MISC 1 Device by Does not apply route as needed. (Patient not taking: Reported on 07/16/2019) 1 each prn  . PROAIR HFA 108 (90 Base) MCG/ACT inhaler 2 PUFFS EVERY 4 HOURS AS NEEDED FOR WHEEZING (Patient not taking: Reported on 08/20/2019) 8.5 g 0  . prochlorperazine (COMPAZINE) 10 MG tablet Take 1 tablet (10 mg total) by mouth every 6 (six) hours as needed for nausea or vomiting. (Patient not taking: Reported on 06/18/2019) 30 tablet 1   No current facility-administered medications for this visit.    Facility-Administered Medications Ordered in Other Visits  Medication Dose Route Frequency  Provider Last Rate Last Dose  . heparin lock flush 100 unit/mL  500 Units Intracatheter Once PRN Curt Bears, MD      . lurbinectedin [facility]) 6.1 mg in sodium chloride 0.9 % 250 mL chemo infusion  3.2 mg/m2 (Treatment Plan Recorded) Intravenous Once Curt Bears, MD      . magnesium sulfate IVPB 2 g 50 mL  2 g Intravenous Once Minard Millirons L, [name]      . sodium chloride flush (NS) 0.9 % injection 10 mL  10 mL Intracatheter PRN Curt Bears, MD        SURGICAL HISTORY:  Past Surgical History:  Procedure Laterality Date  . ABDOMINAL HYSTERECTOMY    . IR IMAGING GUIDED PORT INSERTION  08/02/2018  . LUMBAR DISC SURGERY    . VIDEO BRONCHOSCOPY Bilateral 07/10/2018   Procedure: VIDEO BRONCHOSCOPY WITHOUT FLUORO;  Surgeon: Laurin Coder, MD;  Location: [facility];  Service: Cardiopulmonary;  Laterality: Bilateral;    REVIEW OF SYSTEMS:   Review of Systems  Constitutional: Negative for appetite change, chills, fatigue, fever and unexpected weight change.  HENT: Negative for mouth sores, nosebleeds, sore throat and trouble swallowing.   Eyes: Negative for eye problems and icterus.  Respiratory: Negative for cough, hemoptysis, shortness  of breath and wheezing.   Cardiovascular: Negative for chest pain and leg swelling.  Gastrointestinal: Negative for abdominal pain, constipation, diarrhea, nausea and vomiting.  Genitourinary: Negative for bladder incontinence, difficulty urinating, dysuria, frequency and hematuria.   Musculoskeletal: Negative for back pain, gait problem, neck pain and neck stiffness.  Skin: Negative for itching and rash.  Neurological: Positive for occasional headaches. Negative for dizziness, extremity weakness, gait problem, light-headedness and seizures.  Hematological: Negative for adenopathy. Does not bruise/bleed easily.  Psychiatric/Behavioral: Negative for confusion, depression and sleep disturbance. The patient is not nervous/anxious.      PHYSICAL EXAMINATION:  Blood pressure (!) 163/94, pulse 80, temperature 100.1 F (37.8 C), temperature source Temporal, resp. rate 17, height 5\' 5"  (1.651 m), weight 174 lb 6.4 oz (79.1 kg), SpO2 100 %.  ECOG PERFORMANCE STATUS: 1 - Symptomatic but completely ambulatory  Physical Exam  Constitutional: Oriented to person, place, and time and well-developed, well-nourished, and in no distress. No distress.  HENT:  Head: Normocephalic and atraumatic.  Mouth/Throat: Oropharynx is clear and moist. No oropharyngeal exudate.  Eyes: Conjunctivae are normal. Right eye exhibits no discharge. Left eye exhibits no discharge. No scleral icterus.  Neck: Normal range of motion. Neck supple.  Cardiovascular: Normal rate, regular rhythm, normal heart sounds and intact distal pulses.   Pulmonary/Chest: Effort normal and breath sounds normal. No respiratory distress. No wheezes. No rales.  Abdominal: Soft. Bowel sounds are normal. Exhibits no distension and no mass. There is no tenderness.  Musculoskeletal: Normal range of motion. Exhibits no edema.  Lymphadenopathy:    No cervical adenopathy.  Neurological: Alert and oriented to person, place, and time. Exhibits normal muscle tone. Gait normal. Coordination normal.  Skin: Skin is warm and dry. No rash noted. Not diaphoretic. No erythema. No pallor.  Psychiatric: Mood, memory and judgment normal.  Vitals reviewed.  LABORATORY DATA: Lab Results  Component Value Date   WBC 5.7 08/20/2019   HGB 10.6 (L) 08/20/2019   HCT 29.7 (L) 08/20/2019   MCV 104.2 (H) 08/20/2019   PLT 116 (L) 08/20/2019      Chemistry      Component Value Date/Time   NA 122 (L) 08/20/2019 1125   NA 124 (L) 07/16/2018 0853   K 3.7 08/20/2019 1125   CL 87 (L) 08/20/2019 1125   CO2 26 08/20/2019 1125   BUN 8 08/20/2019 1125   BUN 8 07/16/2018 0853   CREATININE 0.70 08/20/2019 1125   CREATININE 0.68 02/13/2013 1523      Component Value Date/Time   CALCIUM 8.8 (L)  08/20/2019 1125   ALKPHOS 227 (H) 08/20/2019 1125   AST 70 (H) 08/20/2019 1125   ALT 52 (H) 08/20/2019 1125   BILITOT 0.4 08/20/2019 1125       RADIOGRAPHIC STUDIES:  Ct Chest W Contrast  Result Date: 07/23/2019 CLINICAL DATA:  Extensive stage small cell lung cancer. EXAM: CT CHEST, ABDOMEN, AND PELVIS WITH CONTRAST TECHNIQUE: Multidetector CT imaging of the chest, abdomen and pelvis was performed following the standard protocol during bolus administration of intravenous contrast. CONTRAST:  160mL OMNIPAQUE IOHEXOL 300 MG/ML  SOLN COMPARISON:  02/14/2019 FINDINGS: CT CHEST FINDINGS Cardiovascular: The heart size appears within normal limits. Aortic atherosclerosis. Lad and RCA coronary artery calcifications. No pericardial effusion. Mediastinum/Nodes: Thyroid gland is either atrophic or surgically absent. The trachea appears patent and is midline. Normal appearance of the esophagus. Left paratracheal lymph node measures 1.2 cm, image 20/2. Previously this measured the same. The index left hilar lesion  measures 0.9 x 0.9 cm, image 27/2. Previously 1.1 by 1.0 cm. No new or enlarging mediastinal or hilar lymph nodes. Lungs/Pleura: No pleural effusion. Centrilobular emphysema. Geographic area of fibrosis, ground-glass attenuation and architectural distortion is identified within the left mid lung compatible with changes secondary to external beam radiation. Irregular density in the right base and posterior row medial right lower lobe identified, image 99/6. New from previous exam, favored to represent postinflammatory change. No suspicious pulmonary nodule or mass. Musculoskeletal: No chest wall mass or suspicious bone lesions identified. CT ABDOMEN PELVIS FINDINGS Hepatobiliary: The liver appears cirrhotic. Faint, ill-defined liver lesions are again identified scattered throughout both lobes. Index lesion within lateral segment of left lobe measures 2.1 cm, image 59/2. Previously 1.3 cm. The index lesion  within lateral segment of left lobe of liver measures 0.9 cm, image 63/2. Unchanged. Also in left lobe of liver is a 1.9 cm low-density lesion, image 58/2. Previously 1 cm. Previously measured central right lobe of liver lesion is not confidently seen on today's study. Gallbladder normal. No biliary dilatation. Pancreas: Unremarkable. No pancreatic ductal dilatation or surrounding inflammatory changes. Spleen: Normal in size without focal abnormality. Adrenals/Urinary Tract: Normal appearance of the adrenal glands. No kidney mass or hydronephrosis. The urinary bladder is unremarkable. Stomach/Bowel: The small bowel loops are nondilated. The appendix is visualized and appears normal. No pathologic dilatation of the colon. Vascular/Lymphatic: Aortic atherosclerosis. No aneurysm. The index portacaval node measures 1.2 cm, image 61/2. Unchanged. Left periaortic node measures 1.2 cm, image 56/2. Unchanged. Previous pelvic node dissection. Reproductive: Status post hysterectomy.  No adnexal mass. Other: No ascites or focal fluid collections. Musculoskeletal: Increased sclerosis and right femoral head compatible with AVN.Previous interbody fusion of L4-5. No acute or suspicious osseous findings. IMPRESSION: 1. Stable appearance of the chest. Small residual soft tissue in the left perihilar region is unchanged from prior study. 2. Multiple hepatic lesions within a background of cirrhosis are again noted. Lesions are ill-defined and difficult to measure by CT. There are several low-density lesions in left lobe of liver which appear increased in size in the interval. Other lesions are unchanged or less conspicuous. 3. Unchanged borderline upper abdominal adenopathy. 4. Cirrhosis 5. Aortic Atherosclerosis (ICD10-I70.0) and Emphysema (ICD10-J43.9). Coronary artery calcifications. Electronically Signed   By: Kerby Moors M.D.   On: 07/23/2019 09:02   Ct Abdomen Pelvis W Contrast  Result Date: 07/23/2019 CLINICAL DATA:   Extensive stage small cell lung cancer. EXAM: CT CHEST, ABDOMEN, AND PELVIS WITH CONTRAST TECHNIQUE: Multidetector CT imaging of the chest, abdomen and pelvis was performed following the standard protocol during bolus administration of intravenous contrast. CONTRAST:  196mL OMNIPAQUE IOHEXOL 300 MG/ML  SOLN COMPARISON:  02/14/2019 FINDINGS: CT CHEST FINDINGS Cardiovascular: The heart size appears within normal limits. Aortic atherosclerosis. Lad and RCA coronary artery calcifications. No pericardial effusion. Mediastinum/Nodes: Thyroid gland is either atrophic or surgically absent. The trachea appears patent and is midline. Normal appearance of the esophagus. Left paratracheal lymph node measures 1.2 cm, image 20/2. Previously this measured the same. The index left hilar lesion measures 0.9 x 0.9 cm, image 27/2. Previously 1.1 by 1.0 cm. No new or enlarging mediastinal or hilar lymph nodes. Lungs/Pleura: No pleural effusion. Centrilobular emphysema. Geographic area of fibrosis, ground-glass attenuation and architectural distortion is identified within the left mid lung compatible with changes secondary to external beam radiation. Irregular density in the right base and posterior row medial right lower lobe identified, image 99/6. New from previous exam, favored to represent  postinflammatory change. No suspicious pulmonary nodule or mass. Musculoskeletal: No chest wall mass or suspicious bone lesions identified. CT ABDOMEN PELVIS FINDINGS Hepatobiliary: The liver appears cirrhotic. Faint, ill-defined liver lesions are again identified scattered throughout both lobes. Index lesion within lateral segment of left lobe measures 2.1 cm, image 59/2. Previously 1.3 cm. The index lesion within lateral segment of left lobe of liver measures 0.9 cm, image 63/2. Unchanged. Also in left lobe of liver is a 1.9 cm low-density lesion, image 58/2. Previously 1 cm. Previously measured central right lobe of liver lesion is not  confidently seen on today's study. Gallbladder normal. No biliary dilatation. Pancreas: Unremarkable. No pancreatic ductal dilatation or surrounding inflammatory changes. Spleen: Normal in size without focal abnormality. Adrenals/Urinary Tract: Normal appearance of the adrenal glands. No kidney mass or hydronephrosis. The urinary bladder is unremarkable. Stomach/Bowel: The small bowel loops are nondilated. The appendix is visualized and appears normal. No pathologic dilatation of the colon. Vascular/Lymphatic: Aortic atherosclerosis. No aneurysm. The index portacaval node measures 1.2 cm, image 61/2. Unchanged. Left periaortic node measures 1.2 cm, image 56/2. Unchanged. Previous pelvic node dissection. Reproductive: Status post hysterectomy.  No adnexal mass. Other: No ascites or focal fluid collections. Musculoskeletal: Increased sclerosis and right femoral head compatible with AVN.Previous interbody fusion of L4-5. No acute or suspicious osseous findings. IMPRESSION: 1. Stable appearance of the chest. Small residual soft tissue in the left perihilar region is unchanged from prior study. 2. Multiple hepatic lesions within a background of cirrhosis are again noted. Lesions are ill-defined and difficult to measure by CT. There are several low-density lesions in left lobe of liver which appear increased in size in the interval. Other lesions are unchanged or less conspicuous. 3. Unchanged borderline upper abdominal adenopathy. 4. Cirrhosis 5. Aortic Atherosclerosis (ICD10-I70.0) and Emphysema (ICD10-J43.9). Coronary artery calcifications. Electronically Signed   By: Kerby Moors M.D.   On: 07/23/2019 09:02     ASSESSMENT/PLAN:  This is a very pleasant 63 year old Caucasian female diagnosed with extensive stage small cell lung cancer. She presented with a large left perihilar mass as well as mediastinal lymphadenopathy as well as metastatic disease to the liver and bone. She wasdiagnosed in August  2019.  The patient was previously treated with systemic chemotherapy with carboplatin, etoposide, and Tecentriq. She is status post 6 cycles. She then was treated withsingle agent maintenance Tecentriq for 4 cycles. This was discontinued due to evidence of disease progression.  She underwent palliative radiotherapy to the progressive disease involving the lung and mediastinum  She recently was treated with cisplatin at a reduced dose of 25 mg/m andirinotecan50 mg/m on days 1 and 8 every 3 weeks. She is status post3cycles. This was discontinued recently secondary to pancytopenia and electrolyte abnormalities requiring frequent dose delays. She also had evidence of increased liver lesions on her most recent restaging CT scan.   She is currently undergoing treatment with ZepzelcaIV every 3 weeks. She is status post her first cycle and she tolerated it well without any adverse side effects.   Labs were reviewed and were adequate for treatment. We will continue to monitor her labs closely every week. She will proceed with cycle #2 today as scheduled.   I will arrange for a restaging CT scan to be performed prior to her next cycle of treatment. The patient received radiation to the metastatic brain lesions in May 2020. The patient has not had a repeat brain MRI since that time. We will order a repeat brain MRI to be performed  prior to her next visit.   We will see her back for a follow up visit in 3 weeks for evaluation and to review her scan results before starting cycle #3.   The patient will continue taking demeclocycline for her hyponatremia and follow her fluid restriction. Her sodium is 122 today. I reviewed concerning symptoms of hyponatremia which warrant medical evaluation. She also will continue to eat a diet rich in potassium.  She will receive 2g of magnesium sulfate while in infusion today and she will continue to take magnesium oxide 400 mg BID p.o. daily.   The patient's  blood pressure was noted to be elevated again today. She monitors her BP at home by a nurse that comes by once a week and states that it is within normal limits. She will continue to monitor her BP at home and will seek medical evaluation with her PCP if it continues to be high for discussion of initiating anti-hypertensive medications.   The patient was advised to call immediately if she has any concerning symptoms in the interval. The patient voices understanding of current disease status and treatment options and is in agreement with the current care plan. All questions were answered. The patient knows to call the clinic with any problems, questions or concerns. We can certainly see the patient much sooner if necessary  Orders Placed This Encounter  Procedures  . CT Chest W Contrast    Standing Status:   Future    Standing Expiration Date:   08/19/2020    Order Specific Question:   ** REASON FOR EXAM (FREE TEXT)    Answer:   Restaging Lung Cancer    Order Specific Question:   If indicated for the ordered procedure, I authorize the administration of contrast media per Radiology protocol    Answer:   Yes    Order Specific Question:   Preferred imaging location?    Answer:   [facility]    Order Specific Question:   Radiology Contrast Protocol - do NOT remove file path    Answer:   \\[email or web address]  . CT Abdomen Pelvis W Contrast    Standing Status:   Future    Standing Expiration Date:   08/19/2020    Order Specific Question:   ** REASON FOR EXAM (FREE TEXT)    Answer:   Restaging Lung Cancer    Order Specific Question:   If indicated for the ordered procedure, I authorize the administration of contrast media per Radiology protocol    Answer:   Yes    Order Specific Question:   Preferred imaging location?    Answer:   [facility]    Order Specific Question:   Is Oral Contrast requested for this exam?    Answer:   Yes, Per Radiology protocol     Order Specific Question:   Radiology Contrast Protocol - do NOT remove file path    Answer:   \\[email or web address]  . MR Brain W Wo Contrast    Standing Status:   Future    Standing Expiration Date:   08/19/2020    Order Specific Question:   ** REASON FOR EXAM (FREE TEXT)    Answer:   Restaging Lung Cancer with brain metstasis. Headaches.    Order Specific Question:   If indicated for the ordered procedure, I authorize the administration of contrast media per Radiology protocol    Answer:   Yes    Order Specific Question:   What is the patient's  sedation requirement?    Answer:   No Sedation    Order Specific Question:   Does the patient have a pacemaker or implanted devices?    Answer:   No    Order Specific Question:   Use SRS Protocol?    Answer:   No    Order Specific Question:   Radiology Contrast Protocol - do NOT remove file path    Answer:   [email or web address]    Order Specific Question:   Preferred imaging location?    Answer:   [facility] (table limit-350 lbs)     Jessica Devins L Torrey Horseman, [name] 08/20/19

## 2019-08-25 ENCOUNTER — Other Ambulatory Visit: Payer: Self-pay | Admitting: Internal Medicine

## 2019-08-25 ENCOUNTER — Other Ambulatory Visit: Payer: Self-pay | Admitting: Physician Assistant

## 2019-08-27 ENCOUNTER — Other Ambulatory Visit: Payer: Self-pay

## 2019-08-27 ENCOUNTER — Inpatient Hospital Stay: Payer: Medicare Other | Attending: Internal Medicine

## 2019-08-27 DIAGNOSIS — E039 Hypothyroidism, unspecified: Secondary | ICD-10-CM | POA: Diagnosis not present

## 2019-08-27 DIAGNOSIS — C3492 Malignant neoplasm of unspecified part of left bronchus or lung: Secondary | ICD-10-CM | POA: Insufficient documentation

## 2019-08-27 DIAGNOSIS — C787 Secondary malignant neoplasm of liver and intrahepatic bile duct: Secondary | ICD-10-CM | POA: Diagnosis not present

## 2019-08-27 DIAGNOSIS — D61818 Other pancytopenia: Secondary | ICD-10-CM | POA: Diagnosis not present

## 2019-08-27 DIAGNOSIS — Z79899 Other long term (current) drug therapy: Secondary | ICD-10-CM | POA: Insufficient documentation

## 2019-08-27 DIAGNOSIS — C7951 Secondary malignant neoplasm of bone: Secondary | ICD-10-CM | POA: Insufficient documentation

## 2019-08-27 DIAGNOSIS — E876 Hypokalemia: Secondary | ICD-10-CM | POA: Insufficient documentation

## 2019-08-27 LAB — CBC WITH DIFFERENTIAL (CANCER CENTER ONLY)
Abs Immature Granulocytes: 0.04 10*3/uL (ref 0.00–0.07)
Basophils Absolute: 0 10*3/uL (ref 0.0–0.1)
Basophils Relative: 0 %
Eosinophils Absolute: 0 10*3/uL (ref 0.0–0.5)
Eosinophils Relative: 0 %
HCT: 31.5 % — ABNORMAL LOW (ref 36.0–46.0)
Hemoglobin: 10.7 g/dL — ABNORMAL LOW (ref 12.0–15.0)
Immature Granulocytes: 1 %
Lymphocytes Relative: 15 %
Lymphs Abs: 0.7 10*3/uL (ref 0.7–4.0)
MCH: 35.9 pg — ABNORMAL HIGH (ref 26.0–34.0)
MCHC: 34 g/dL (ref 30.0–36.0)
MCV: 105.7 fL — ABNORMAL HIGH (ref 80.0–100.0)
Monocytes Absolute: 0.4 10*3/uL (ref 0.1–1.0)
Monocytes Relative: 8 %
Neutro Abs: 3.8 10*3/uL (ref 1.7–7.7)
Neutrophils Relative %: 76 %
Platelet Count: 78 10*3/uL — ABNORMAL LOW (ref 150–400)
RBC: 2.98 MIL/uL — ABNORMAL LOW (ref 3.87–5.11)
RDW: 13.7 % (ref 11.5–15.5)
WBC Count: 5 10*3/uL (ref 4.0–10.5)
nRBC: 0 % (ref 0.0–0.2)

## 2019-08-27 LAB — MAGNESIUM: Magnesium: 1.6 mg/dL — ABNORMAL LOW (ref 1.7–2.4)

## 2019-08-27 LAB — CMP (CANCER CENTER ONLY)
ALT: 48 U/L — ABNORMAL HIGH (ref 0–44)
AST: 52 U/L — ABNORMAL HIGH (ref 15–41)
Albumin: 3.5 g/dL (ref 3.5–5.0)
Alkaline Phosphatase: 259 U/L — ABNORMAL HIGH (ref 38–126)
Anion gap: 8 (ref 5–15)
BUN: 8 mg/dL (ref 8–23)
CO2: 30 mmol/L (ref 22–32)
Calcium: 9 mg/dL (ref 8.9–10.3)
Chloride: 91 mmol/L — ABNORMAL LOW (ref 98–111)
Creatinine: 0.7 mg/dL (ref 0.44–1.00)
GFR, Est AFR Am: >60 mL/min (ref >60)
GFR, Estimated: >60 mL/min (ref >60)
Glucose, Bld: 80 mg/dL (ref 70–99)
Potassium: 3.5 mmol/L (ref 3.5–5.1)
Sodium: 129 mmol/L — ABNORMAL LOW (ref 135–145)
Total Bilirubin: 0.4 mg/dL (ref 0.3–1.2)
Total Protein: 7.2 g/dL (ref 6.5–8.1)

## 2019-09-03 ENCOUNTER — Inpatient Hospital Stay: Payer: Medicare Other

## 2019-09-03 ENCOUNTER — Other Ambulatory Visit: Payer: Self-pay

## 2019-09-03 DIAGNOSIS — C3492 Malignant neoplasm of unspecified part of left bronchus or lung: Secondary | ICD-10-CM | POA: Diagnosis not present

## 2019-09-03 LAB — CMP (CANCER CENTER ONLY)
ALT: 30 U/L (ref 0–44)
AST: 41 U/L (ref 15–41)
Albumin: 3.6 g/dL (ref 3.5–5.0)
Alkaline Phosphatase: 212 U/L — ABNORMAL HIGH (ref 38–126)
Anion gap: 10 (ref 5–15)
BUN: 7 mg/dL — ABNORMAL LOW (ref 8–23)
CO2: 30 mmol/L (ref 22–32)
Calcium: 9.3 mg/dL (ref 8.9–10.3)
Chloride: 91 mmol/L — ABNORMAL LOW (ref 98–111)
Creatinine: 0.69 mg/dL (ref 0.44–1.00)
GFR, Est AFR Am: >60 mL/min (ref >60)
GFR, Estimated: >60 mL/min (ref >60)
Glucose, Bld: 90 mg/dL (ref 70–99)
Potassium: 3.3 mmol/L — ABNORMAL LOW (ref 3.5–5.1)
Sodium: 131 mmol/L — ABNORMAL LOW (ref 135–145)
Total Bilirubin: 0.4 mg/dL (ref 0.3–1.2)
Total Protein: 7.4 g/dL (ref 6.5–8.1)

## 2019-09-03 LAB — CBC WITH DIFFERENTIAL (CANCER CENTER ONLY)
Abs Immature Granulocytes: 0.01 10*3/uL (ref 0.00–0.07)
Basophils Absolute: 0 10*3/uL (ref 0.0–0.1)
Basophils Relative: 0 %
Eosinophils Absolute: 0 10*3/uL (ref 0.0–0.5)
Eosinophils Relative: 1 %
HCT: 32.1 % — ABNORMAL LOW (ref 36.0–46.0)
Hemoglobin: 10.8 g/dL — ABNORMAL LOW (ref 12.0–15.0)
Immature Granulocytes: 0 %
Lymphocytes Relative: 16 %
Lymphs Abs: 0.7 10*3/uL (ref 0.7–4.0)
MCH: 35.5 pg — ABNORMAL HIGH (ref 26.0–34.0)
MCHC: 33.6 g/dL (ref 30.0–36.0)
MCV: 105.6 fL — ABNORMAL HIGH (ref 80.0–100.0)
Monocytes Absolute: 0.6 10*3/uL (ref 0.1–1.0)
Monocytes Relative: 13 %
Neutro Abs: 3 10*3/uL (ref 1.7–7.7)
Neutrophils Relative %: 70 %
Platelet Count: 79 10*3/uL — ABNORMAL LOW (ref 150–400)
RBC: 3.04 MIL/uL — ABNORMAL LOW (ref 3.87–5.11)
RDW: 14.5 % (ref 11.5–15.5)
WBC Count: 4.3 10*3/uL (ref 4.0–10.5)
nRBC: 0 % (ref 0.0–0.2)

## 2019-09-08 ENCOUNTER — Ambulatory Visit (HOSPITAL_COMMUNITY)
Admission: RE | Admit: 2019-09-08 | Discharge: 2019-09-08 | Disposition: A | Discharge status: Home / Self Care | Payer: Medicare Other | Source: Ambulatory Visit | Attending: Physician Assistant | Admitting: Physician Assistant

## 2019-09-08 ENCOUNTER — Other Ambulatory Visit: Payer: Medicare Other

## 2019-09-08 ENCOUNTER — Other Ambulatory Visit: Payer: Self-pay

## 2019-09-08 DIAGNOSIS — C3492 Malignant neoplasm of unspecified part of left bronchus or lung: Secondary | ICD-10-CM

## 2019-09-08 DIAGNOSIS — Z8589 Personal history of malignant neoplasm of other organs and systems: Secondary | ICD-10-CM

## 2019-09-08 DIAGNOSIS — Z85841 Personal history of malignant neoplasm of brain: Secondary | ICD-10-CM | POA: Insufficient documentation

## 2019-09-08 MED ORDER — SODIUM CHLORIDE (PF) 0.9 % IJ SOLN
INTRAMUSCULAR | Status: AC
  Filled 2019-09-08: qty 50

## 2019-09-08 MED ORDER — IOHEXOL 300 MG/ML  SOLN
100.0000 mL | Freq: Once | INTRAMUSCULAR | Status: AC | PRN
  Administered 2019-09-08: 14:00:00 100 mL via INTRAVENOUS

## 2019-09-08 MED ORDER — GADOBUTROL 1 MMOL/ML IV SOLN
8.0000 mL | Freq: Once | INTRAVENOUS | Status: AC | PRN
  Administered 2019-09-08: 8 mL via INTRAVENOUS

## 2019-09-09 ENCOUNTER — Other Ambulatory Visit: Payer: Self-pay | Admitting: Physician Assistant

## 2019-09-09 ENCOUNTER — Other Ambulatory Visit: Payer: Self-pay | Admitting: Internal Medicine

## 2019-09-10 ENCOUNTER — Ambulatory Visit: Payer: Medicare Other | Admitting: Internal Medicine

## 2019-09-10 ENCOUNTER — Encounter: Payer: Self-pay | Admitting: Internal Medicine

## 2019-09-10 ENCOUNTER — Telehealth: Payer: Self-pay | Admitting: Emergency Medicine

## 2019-09-10 ENCOUNTER — Other Ambulatory Visit: Payer: Self-pay

## 2019-09-10 ENCOUNTER — Inpatient Hospital Stay: Payer: Medicare Other

## 2019-09-10 ENCOUNTER — Inpatient Hospital Stay (HOSPITAL_BASED_OUTPATIENT_CLINIC_OR_DEPARTMENT_OTHER): Payer: Medicare Other | Admitting: Internal Medicine

## 2019-09-10 VITALS — BP 151/71 | HR 76 | Temp 98.3°F | Resp 18 | Ht 65.0 in | Wt 169.3 lb

## 2019-09-10 DIAGNOSIS — D61810 Antineoplastic chemotherapy induced pancytopenia: Secondary | ICD-10-CM | POA: Diagnosis not present

## 2019-09-10 DIAGNOSIS — T451X5A Adverse effect of antineoplastic and immunosuppressive drugs, initial encounter: Secondary | ICD-10-CM

## 2019-09-10 DIAGNOSIS — C3492 Malignant neoplasm of unspecified part of left bronchus or lung: Secondary | ICD-10-CM

## 2019-09-10 DIAGNOSIS — I10 Essential (primary) hypertension: Secondary | ICD-10-CM | POA: Diagnosis not present

## 2019-09-10 DIAGNOSIS — E876 Hypokalemia: Secondary | ICD-10-CM | POA: Diagnosis not present

## 2019-09-10 DIAGNOSIS — Z5111 Encounter for antineoplastic chemotherapy: Secondary | ICD-10-CM

## 2019-09-10 DIAGNOSIS — Z95828 Presence of other vascular implants and grafts: Secondary | ICD-10-CM

## 2019-09-10 DIAGNOSIS — Z7189 Other specified counseling: Secondary | ICD-10-CM

## 2019-09-10 LAB — CBC WITH DIFFERENTIAL (CANCER CENTER ONLY)
Abs Immature Granulocytes: 0.02 10*3/uL (ref 0.00–0.07)
Basophils Absolute: 0 10*3/uL (ref 0.0–0.1)
Basophils Relative: 0 %
Eosinophils Absolute: 0 10*3/uL (ref 0.0–0.5)
Eosinophils Relative: 0 %
HCT: 32.9 % — ABNORMAL LOW (ref 36.0–46.0)
Hemoglobin: 11.6 g/dL — ABNORMAL LOW (ref 12.0–15.0)
Immature Granulocytes: 0 %
Lymphocytes Relative: 11 %
Lymphs Abs: 0.7 10*3/uL (ref 0.7–4.0)
MCH: 35.7 pg — ABNORMAL HIGH (ref 26.0–34.0)
MCHC: 35.3 g/dL (ref 30.0–36.0)
MCV: 101.2 fL — ABNORMAL HIGH (ref 80.0–100.0)
Monocytes Absolute: 1 10*3/uL (ref 0.1–1.0)
Monocytes Relative: 17 %
Neutro Abs: 4.2 10*3/uL (ref 1.7–7.7)
Neutrophils Relative %: 72 %
Platelet Count: 164 10*3/uL (ref 150–400)
RBC: 3.25 MIL/uL — ABNORMAL LOW (ref 3.87–5.11)
RDW: 14.3 % (ref 11.5–15.5)
WBC Count: 5.9 10*3/uL (ref 4.0–10.5)
nRBC: 0 % (ref 0.0–0.2)

## 2019-09-10 LAB — CMP (CANCER CENTER ONLY)
ALT: 39 U/L (ref 0–44)
AST: 53 U/L — ABNORMAL HIGH (ref 15–41)
Albumin: 3.6 g/dL (ref 3.5–5.0)
Alkaline Phosphatase: 218 U/L — ABNORMAL HIGH (ref 38–126)
Anion gap: 13 (ref 5–15)
BUN: 7 mg/dL — ABNORMAL LOW (ref 8–23)
CO2: 26 mmol/L (ref 22–32)
Calcium: 9.1 mg/dL (ref 8.9–10.3)
Chloride: 90 mmol/L — ABNORMAL LOW (ref 98–111)
Creatinine: 0.63 mg/dL (ref 0.44–1.00)
GFR, Est AFR Am: >60 mL/min (ref >60)
GFR, Estimated: >60 mL/min (ref >60)
Glucose, Bld: 109 mg/dL — ABNORMAL HIGH (ref 70–99)
Potassium: 2.9 mmol/L — CL (ref 3.5–5.1)
Sodium: 129 mmol/L — ABNORMAL LOW (ref 135–145)
Total Bilirubin: 0.5 mg/dL (ref 0.3–1.2)
Total Protein: 7.4 g/dL (ref 6.5–8.1)

## 2019-09-10 MED ORDER — POTASSIUM CHLORIDE ER 20 MEQ PO TBCR: 20.0000 meq | EXTENDED_RELEASE_TABLET | Freq: Every day | ORAL | 0 refills | Status: AC

## 2019-09-10 MED ORDER — HEPARIN SOD (PORK) LOCK FLUSH 100 UNIT/ML IV SOLN
500.0000 [IU] | Freq: Once | INTRAVENOUS | Status: AC
  Administered 2019-09-10: 500 [IU] via INTRAVENOUS
  Filled 2019-09-10: qty 5

## 2019-09-10 MED ORDER — SODIUM CHLORIDE 0.9% FLUSH
10.0000 mL | Freq: Once | INTRAVENOUS | Status: AC
  Administered 2019-09-10: 10 mL via INTRAVENOUS
  Filled 2019-09-10: qty 10

## 2019-09-10 MED ORDER — SODIUM CHLORIDE 0.9% FLUSH
10.0000 mL | INTRAVENOUS | Status: DC | PRN
  Administered 2019-09-10: 10 mL
  Filled 2019-09-10: qty 10

## 2019-09-10 MED ORDER — SODIUM CHLORIDE 0.9 % IV SOLN
INTRAVENOUS | Status: DC
  Administered 2019-09-10: 11:00:00 via INTRAVENOUS
  Filled 2019-09-10: qty 250

## 2019-09-10 MED ORDER — POTASSIUM CHLORIDE 10 MEQ/100ML IV SOLN
10.0000 meq | INTRAVENOUS | Status: AC
  Administered 2019-09-10 (×4): 10 meq via INTRAVENOUS
  Filled 2019-09-10: qty 100

## 2019-09-10 NOTE — Progress Notes (Signed)
[place] [phone]   [phone]  OFFICE PROGRESS NOTE  Jessica Norlander, DO [place] [address]  DIAGNOSIS: Extensive stage (T3, N2, M1a)  small cell lung cancer diagnosed in August 2019 and presented with large left hilar/subhilar mass in addition to mediastinal lymphadenopathy and contracture and mass in the right upper lobe.  PRIOR THERAPY:  1) Systemic therapy with carboplatin for AUC of 5 on day 1, etoposide 100 mg/M2 on days 1, 2 and 3 as well as Tecentriq (Atezolizumab) 1200 mg IV every 3 weeks with Neulasta support.  Status post 10 cycles.  Starting from cycle #7, the patient will be treated with only single agent immunotherapy with Tecentriq 1200 mg IV every 3 weeks.  Last dose was given January 27, 2019 discontinued secondary to disease progression. 2) palliative radiotherapy to the mediastinal lymph nodes. 3) Systemic chemotherapy with reduced dose cisplatin 25 mg/M2 and irinotecan 50 mg/M2 every 3 weeks.  First dose Apr 16, 2019.  Status post 4 cycles.  Discontinued secondary to disease progression. 4) systemic chemotherapy with Zepzelca (lurbinectedin) 3.2 mg/M2 every 3 weeks status post 2 cycles.  Discontinued secondary to disease progression.  CURRENT THERAPY: None.   INTERVAL HISTORY: [name] 63 y.o. female returns to the clinic today for follow-up visit.  Her husband [name] was available by phone during the visit.  The patient is feeling fine today with no concerning complaints except for fatigue and occasional constipation.  She also has shortness of breath with exertion.  She denied having any chest pain, cough or hemoptysis.  She denied having any fever or chills.  She has no nausea, vomiting, diarrhea or constipation.  She has no headache or visual changes.  She tolerated her treatment with Zepzelca (lurbinectedin) fairly well.  She had repeat CT scan of the chest, abdomen pelvis as well as MRI of the brain  performed recently and she is here for evaluation and discussion of her scan results and treatment options.  MEDICAL HISTORY: Past Medical History:  Diagnosis Date   Alpha-1-antitrypsin deficiency ([place])    Cervical cancer (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 This doesn't fit entirely with emphysema.       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  MEDICATIONS:  Current Outpatient Medications  Medication Sig Dispense Refill   albuterol (PROVENTIL) (2.5 MG/3ML) 0.083% nebulizer solution Take 3 mLs (2.5 mg total) by nebulization every 6 (six) hours as needed. (Patient not taking: Reported on 07/16/2019) 90 mL 4   allopurinol (ZYLOPRIM) 300 MG tablet Take 1 tablet (300 mg total) by mouth daily. Begin on 07/25/2018 14 tablet 0   amitriptyline (ELAVIL) 75 MG  tablet Take 75 mg by mouth at bedtime.       benzonatate (TESSALON) 100 MG capsule TAKE 1 CAPSULE THREE TIMES DAILY AS NEEDED FOR COUGH 20 capsule 0   BEVESPI AEROSPHERE 9-4.8 MCG/ACT AERO Inhale 2 puffs into the lungs 2 (two) times daily. 10.7 g 0   demeclocycline (DECLOMYCIN) 150 MG tablet TAKE   (1)  TABLET TWICE A DAY. 60 tablet 0   ezetimibe (ZETIA) 10 MG tablet TAKE 1 TABLET ONCE A DAY 90 tablet 0   feeding supplement, ENSURE ENLIVE, (ENSURE ENLIVE) LIQD Take 237 mLs by mouth 2 (two) times daily between meals. 60 Bottle 0   fluticasone (FLONASE) 50 MCG/ACT nasal spray USE 2 SPRAYS IN EACH NOSTRIL ONCE DAILY. 16 g 5   folic acid (FOLVITE) 1 MG tablet Take 1 mg by mouth daily.     HYDROcodone-acetaminophen (NORCO/VICODIN) 5-325 MG tablet Take 1 tablet by mouth 3 (three) times daily. 30 tablet 0   levothyroxine (SYNTHROID) 75 MCG tablet TAKE (1) TABLET DAILY BE- FORE BREAKFAST. 30 tablet 0   lidocaine-prilocaine (EMLA) cream Apply 1 application topically as needed. 30 g 0   magnesium oxide (MAG-OX) 400 (241.3 Mg) MG tablet TAKE  (1)  TABLET TWICE A DAY. 30 tablet 0   Nebulizers (COMPRESSOR NEBULIZER) MISC 1 Device by Does not apply route as needed. (Patient not taking: Reported on 07/16/2019) 1 each prn   omeprazole (PRILOSEC) 40 MG capsule TAKE (1) CAPSULE DAILY 90 capsule 0   potassium chloride SA (K-DUR) 20 MEQ tablet Take 1 tablet (20 mEq total) by mouth 2 (two) times daily. 14 tablet 0   PROAIR HFA 108 (90 Base) MCG/ACT inhaler 2 PUFFS EVERY 4 HOURS AS NEEDED FOR WHEEZING (Patient not taking: Reported on 08/20/2019) 8.5 g 0   prochlorperazine (COMPAZINE) 10 MG tablet Take 1 tablet (10 mg total) by mouth every 6 (six) hours as needed for nausea or vomiting. (Patient not taking: Reported on 06/18/2019) 30 tablet 1   rosuvastatin (CRESTOR) 40 MG tablet Take 0.5 tablets (20 mg total) by mouth daily. 45 tablet 3   No current facility-administered medications for this visit.     SURGICAL HISTORY:  Past Surgical History:  Procedure Laterality Date   ABDOMINAL HYSTERECTOMY     IR IMAGING GUIDED PORT INSERTION  08/02/2018   LUMBAR DISC SURGERY     VIDEO BRONCHOSCOPY Bilateral 07/10/2018   Procedure: VIDEO BRONCHOSCOPY WITHOUT FLUORO;  Surgeon: Laurin Coder, MD;   Location: [facility];  Service: Cardiopulmonary;  Laterality: Bilateral;    REVIEW OF SYSTEMS:  Constitutional: positive for fatigue Eyes: negative Ears, nose, mouth, throat, and face: negative Respiratory: positive for dyspnea on exertion Cardiovascular: negative Gastrointestinal: negative Genitourinary:negative Integument/breast: negative Hematologic/lymphatic: negative Musculoskeletal:negative Neurological: negative Behavioral/Psych: negative Endocrine: negative Allergic/Immunologic: negative   PHYSICAL EXAMINATION: General appearance: alert, cooperative, fatigued and no distress Head: Normocephalic, without obvious abnormality, atraumatic Neck: no adenopathy, no JVD, supple, symmetrical, trachea midline and thyroid not enlarged, symmetric, no tenderness/mass/nodules Lymph nodes: Cervical, supraclavicular, and axillary nodes normal. Resp: clear to auscultation bilaterally Back: symmetric, no curvature. ROM normal. No CVA tenderness. Cardio: regular rate and rhythm, S1, S2 normal, no murmur, click, rub or gallop GI: soft, non-tender; bowel sounds normal; no masses,  no organomegaly Extremities: extremities normal, atraumatic, no cyanosis or edema Neurologic: Alert and oriented X 3, normal strength and tone. Normal symmetric reflexes. Normal coordination and gait  ECOG PERFORMANCE STATUS: 1 - Symptomatic but completely ambulatory  Blood pressure (!) 151/71, pulse 76,  temperature 98.3 F (36.8 C), temperature source Temporal, resp. rate 18, height 5\' 5"  (1.651 m), weight 169 lb 4.8 oz (76.8 kg), SpO2 97 %.  LABORATORY DATA: Lab Results  Component Value Date   WBC 5.9 09/10/2019   HGB 11.6 (L) 09/10/2019   HCT 32.9 (L) 09/10/2019   MCV 101.2 (H) 09/10/2019   PLT 164 09/10/2019      Chemistry      Component Value Date/Time   NA 129 (L) 09/10/2019 0955   NA 124 (L) 07/16/2018 0853   K 2.9 (LL) 09/10/2019 0955   CL 90 (L) 09/10/2019 0955   CO2 26 09/10/2019 0955    BUN 7 (L) 09/10/2019 0955   BUN 8 07/16/2018 0853   CREATININE 0.63 09/10/2019 0955   CREATININE 0.68 02/13/2013 1523      Component Value Date/Time   CALCIUM 9.1 09/10/2019 0955   ALKPHOS 218 (H) 09/10/2019 0955   AST 53 (H) 09/10/2019 0955   ALT 39 09/10/2019 0955   BILITOT 0.5 09/10/2019 0955       RADIOGRAPHIC STUDIES: Ct Chest W Contrast  Result Date: 09/08/2019 CLINICAL DATA:  Restaging small cell lung cancer. Initial diagnosis September 2019. EXAM: CT CHEST, ABDOMEN, AND PELVIS WITH CONTRAST TECHNIQUE: Multidetector CT imaging of the chest, abdomen and pelvis was performed following the standard protocol during bolus administration of intravenous contrast. CONTRAST:  176mL OMNIPAQUE IOHEXOL 300 MG/ML  SOLN COMPARISON:  CT 07/22/2019 FINDINGS: CT CHEST FINDINGS Cardiovascular: The heart is normal in size. No pericardial effusion. Permanent right-sided Port-A-Cath is stable. Stable aortic calcifications but no aneurysm or dissection. Stable coronary artery calcifications. Mediastinum/Nodes: Prevascular lymph node on image number 18 measures 12.5 mm. This previously measured 5 mm. AP window node on image number 19 measures 13 mm and previously measured 10.5 mm. Lateral AP window node on image number 19 measures 7.5 mm and previously measured 6 mm. Findings worrisome for progressive/recurrent tumor. Lungs/Pleura: Stable underlying emphysematous changes and pulmonary scarring. Radiation changes involving the left hilum and left paramediastinal lung with interval increase and soft tissue nodularity in this area, particularly posteriorly along the left major fissure. Findings could be due to progressive radiation fibrosis, superimposed infection or recurrent tumor. No pulmonary nodules to suggest pulmonary metastatic disease. Stable scarring changes at both lung bases. Musculoskeletal: No chest wall mass, breast mass or axillary adenopathy. No supraclavicular adenopathy. A few scattered nodes are  stable. CT ABDOMEN PELVIS FINDINGS Hepatobiliary: Underlying cirrhotic changes are again demonstrated. Innumerable hepatic lesions are again demonstrated but are very vague and ill-defined. Several lesions appear more conspicuous and slightly larger. The portal and hepatic veins are patent. No intrahepatic or extrahepatic biliary dilatation. Pancreas: No mass, inflammation or ductal dilatation. Spleen: Normal size.  No focal lesions. Adrenals/Urinary Tract: No adrenal gland lesions. The kidneys are unremarkable and stable. The bladder appears normal. Stomach/Bowel: The stomach, duodenum, small bowel and colon are unremarkable and stable. No acute inflammatory changes, mass lesions or obstructive findings. The terminal ileum and appendix are normal. Vascular/Lymphatic: Stable age advanced vascular calcifications but no aneurysm or dissection. The branch vessels are patent. The major venous structures are patent. Progressive periportal lymphadenopathy. See a CT axis lymph node on image number 57 measures 13 mm and previously measured 8.5 mm. 14 mm portal lymph node on image number 60 previously measured 7.5 mm. Reproductive: Surgically absent. Other: No pelvic mass or pelvic lymphadenopathy. Surgical changes in the pelvis from prior lymph node dissection. Musculoskeletal: Stable scattered sclerotic bone lesions, likely treated  metastasis. Remote AVN involving the right femoral head. IMPRESSION: 1. Enlarging mediastinal lymph nodes in a short interval. Worrisome for aggressive tumor recurrence. 2. Progressive nodular interstitial changes in the left paramediastinal lung could reflect progressive radiation changes, superimposed infection or recurrent tumor. 3. Stable underlying emphysematous changes and pulmonary scarring. 4. Progressive appearing hepatic metastatic disease. 5. Progressive periportal and celiac axis adenopathy. 6. Stable scattered sclerotic bone lesions Electronically Signed   By: Marijo Sanes M.D.    On: 09/08/2019 14:33   Mr Jeri Cos JG Contrast  Result Date: 09/08/2019 CLINICAL DATA:  Restaging small cell lung cancer. 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 EXAM: MRI HEAD WITHOUT AND WITH CONTRAST TECHNIQUE: Multiplanar, multiecho pulse sequences of the brain and surrounding structures were obtained without and with intravenous contrast. CONTRAST:  79mL GADAVIST GADOBUTROL 1 MMOL/ML IV SOLN COMPARISON:  MRI head 02/28/2019 FINDINGS: Brain: No enhancing metastatic deposits are seen on today's study. Numerous small metastatic deposits are seen previously. Extensive white matter signal abnormality is present bilaterally has developed since the prior study. This is most likely due to whole-brain radiation. This also extends into the thalamus and pons. Negative for hydrocephalus. Negative for acute infarct or hemorrhage Vascular: Normal arterial flow voids. Skull and upper cervical spine: Negative Sinuses/Orbits: Negative Other: None IMPRESSION: Negative for metastatic disease to the brain. Previously noted lesions have resolved. 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 Electronically Signed   By: Franchot Gallo M.D.   On: 09/08/2019 16:11   Ct Abdomen Pelvis W Contrast  Result Date: 09/08/2019 CLINICAL DATA:  Restaging small cell lung cancer. Initial diagnosis September 2019. EXAM: CT CHEST, ABDOMEN, AND PELVIS WITH CONTRAST TECHNIQUE: Multidetector CT imaging of the chest, abdomen and pelvis was performed following the standard protocol during bolus administration of intravenous contrast. CONTRAST:  174mL OMNIPAQUE IOHEXOL 300 MG/ML  SOLN COMPARISON:  CT 07/22/2019 FINDINGS: CT CHEST FINDINGS Cardiovascular: The heart is normal in size. No pericardial effusion. Permanent right-sided Port-A-Cath is stable. Stable aortic calcifications but no aneurysm or dissection. Stable coronary artery calcifications. Mediastinum/Nodes: Prevascular lymph node on  image number 18 measures 12.5 mm. This previously measured 5 mm. AP window node on image number 19 measures 13 mm and previously measured 10.5 mm. Lateral AP window node on image number 19 measures 7.5 mm and previously measured 6 mm. Findings worrisome for progressive/recurrent tumor. Lungs/Pleura: Stable underlying emphysematous changes and pulmonary scarring. Radiation changes involving the left hilum and left paramediastinal lung with interval increase and soft tissue nodularity in this area, particularly posteriorly along the left major fissure. Findings could be due to progressive radiation fibrosis, superimposed infection or recurrent tumor. No pulmonary nodules to suggest pulmonary metastatic disease. Stable scarring changes at both lung bases. Musculoskeletal: No chest wall mass, breast mass or axillary adenopathy. No supraclavicular adenopathy. A few scattered nodes are stable. CT ABDOMEN PELVIS FINDINGS Hepatobiliary: Underlying cirrhotic changes are again demonstrated. Innumerable hepatic lesions are again demonstrated but are very vague and ill-defined. Several lesions appear more conspicuous and slightly larger. The portal and hepatic veins are patent. No intrahepatic or extrahepatic biliary dilatation. Pancreas: No mass, inflammation or ductal dilatation. Spleen: Normal size.  No focal lesions. Adrenals/Urinary Tract: No adrenal gland lesions. The kidneys are unremarkable and stable. The bladder appears normal. Stomach/Bowel: The stomach, duodenum, small bowel and colon are unremarkable and stable. No acute inflammatory changes, mass lesions or obstructive findings. The terminal ileum and appendix are normal. Vascular/Lymphatic: Stable age advanced vascular calcifications but  no aneurysm or dissection. The branch vessels are patent. The major venous structures are patent. Progressive periportal lymphadenopathy. See a CT axis lymph node on image number 57 measures 13 mm and previously measured 8.5 mm.  14 mm portal lymph node on image number 60 previously measured 7.5 mm. Reproductive: Surgically absent. Other: No pelvic mass or pelvic lymphadenopathy. Surgical changes in the pelvis from prior lymph node dissection. Musculoskeletal: Stable scattered sclerotic bone lesions, likely treated metastasis. Remote AVN involving the right femoral head. IMPRESSION: 1. Enlarging mediastinal lymph nodes in a short interval. Worrisome for aggressive tumor recurrence. 2. Progressive nodular interstitial changes in the left paramediastinal lung could reflect progressive radiation changes, superimposed infection or recurrent tumor. 3. Stable underlying emphysematous changes and pulmonary scarring. 4. Progressive appearing hepatic metastatic disease. 5. Progressive periportal and celiac axis adenopathy. 6. Stable scattered sclerotic bone lesions Electronically Signed   By: Marijo Sanes M.D.   On: 09/08/2019 14:33    ASSESSMENT AND PLAN: This is a very pleasant 63 years old white female recently diagnosed with extensive stage small cell lung cancer.  The patient had a recent PET scan that showed multiple metastatic disease in the lung, liver as well as bone. The patient is currently on systemic chemotherapy with carboplatin, etoposide and Tecentriq status post 6 cycles. She tolerated the induction phase of her treatment well with no concerning complaints. The patient was started on maintenance treatment with single agent Tecentriq status post 4 cycles. She has been tolerating this treatment well with no concerning adverse effects. Her treatment was discontinued recently secondary to disease progression.  She underwent palliative radiotherapy to the progressive disease in the lung and mediastinum. The patient had evidence for disease progression especially in the liver.  She was started on treatment with reduced dose cisplatin 25 mg/M2 and irinotecan 50 mg/M2 on days 1 and 8 every 3 weeks.  Status post 3 cycles.  She has  been tolerating this treatment well except for the pancytopenia.  This was also discontinued secondary to disease progression. The patient started treatment with Lurbinectedin for 2 cycles. She tolerated this treatment well but unfortunately her recent CT scan of the chest, abdomen pelvis showed significant disease progression in the chest, liver as well as abdominal lymphadenopathy. Her MRI of the brain showed no concerning findings for disease progression in the brain. I had a lengthy discussion with the patient today about her current condition and treatment options.  I discussed with the patient the option of palliative care and hospice referral versus consideration of treatment with single agent paclitaxel. The patient would like to discuss this option with her husband and family before making a final decision. For the hypokalemia, we will arrange for the patient to receive 40 mEq of potassium chloride intravenously in the clinic today.  I will also send prescription for potassium chloride 20 mEq p.o. daily for 10 days to her pharmacy. For the hypothyroidism, I will adjust her dose and continue to monitor her TSH closely. She will call me with her decision hopefully in the next few days.  If she decided to proceed with the palliative care and hospice, will make the referral for the patient and will be happy to continue to be the attending of record for her during her end-of-life care. She was advised to call immediately if she has any concerning symptoms in the interval. The patient voices understanding of current disease status and treatment options and is in agreement with the current care plan. All questions  were answered. The patient knows to call the clinic with any problems, questions or concerns. We can certainly see the patient much sooner if necessary.  Disclaimer: This note was dictated with voice recognition software. Similar sounding words can inadvertently be transcribed and may not be  corrected upon review.

## 2019-09-10 NOTE — Patient Instructions (Signed)
Potassium chloride injection What is this medicine? POTASSIUM CHLORIDE (poe TASS i um KLOOR ide) is a potassium supplement used to prevent and to treat low potassium. Potassium is important for the heart, muscles, and nerves. Too much or too little potassium in the body can cause serious problems. This medicine may be used for other purposes; ask your health care provider or pharmacist if you have questions. COMMON BRAND NAME(S): PROAMP What should I tell my health care provider before I take this medicine? They need to know if you have any of these conditions:  Addison's disease  dehydration  diabetes  heart disease  high levels of potassium in the blood  irregular heartbeat  kidney disease  recent severe burn  an unusual or allergic reaction to potassium, other medicines, foods, dyes, or preservatives  pregnant or trying to get pregnant  breast-feeding How should I use this medicine? This medicine is for infusion into a vein. It is given by a health care professional in a hospital or clinic setting. Talk to your pediatrician regarding the use of this medicine in children. Special care may be needed. Overdosage: If you think you have taken too much of this medicine contact a poison control center or emergency room at once. NOTE: This medicine is only for you. Do not share this medicine with others. What if I miss a dose? This does not apply. What may interact with this medicine? Do not take this medicine with any of the following medications:  certain diuretics such as spironolactone, triamterene  eplerenone  sodium polystyrene sulfonate This medicine may also interact with the following medications:  certain medicines for blood pressure or heart disease like lisinopril, losartan, quinapril, valsartan  medicines that lower your chance of fighting infection such as cyclosporine, tacrolimus  NSAIDs, medicines for pain and inflammation, like ibuprofen or  naproxen  other potassium supplements  salt substitutes This list may not describe all possible interactions. Give your health care provider a list of all the medicines, herbs, non-prescription drugs, or dietary supplements you use. Also tell them if you smoke, drink alcohol, or use illegal drugs. Some items may interact with your medicine. What should I watch for while using this medicine? Your condition will be monitored carefully while you are receiving this medicine. You may need blood work done while you are taking this medicine. What side effects may I notice from receiving this medicine? Side effects that you should report to your doctor or health care professional as soon as possible:  allergic reactions like skin rash, itching or hives, swelling of the face, lips, or tongue  breathing problems  confusion  fast, irregular heartbeat  feeling faint or lightheaded, falls  low blood pressure  numbness or tingling in hands or feet  pain, redness, or irritation at site where injected  unusually weak or tired  weakness, heaviness of legs Side effects that usually do not require medical attention (report to your doctor or health care professional if they continue or are bothersome):  diarrhea  nausea, vomiting  stomach pain This list may not describe all possible side effects. Call your doctor for medical advice about side effects. You may report side effects to FDA at 1-800-FDA-1088. Where should I keep my medicine? This drug is given in a hospital or clinic and will not be stored at home. NOTE: This sheet is a summary. It may not cover all possible information. If you have questions about this medicine, talk to your doctor, pharmacist, or health care provider.  2020 Elsevier/Gold Standard (2016-08-09 13:59:20)  Coronavirus (COVID-19) Are you at risk?  Are you at risk for the Coronavirus (COVID-19)?  To be considered HIGH RISK for Coronavirus (COVID-19), you have to  meet the following criteria:  . Traveled to [place], [place], [place], [place] or [place]; or in the [place] to [place], [place], [place], or [place]; and have fever, cough, and shortness of breath within the last 2 weeks of travel OR . Been in close contact with a person diagnosed with COVID-19 within the last 2 weeks and have fever, cough, and shortness of breath . IF YOU DO NOT MEET THESE CRITERIA, YOU ARE CONSIDERED LOW RISK FOR COVID-19.  What to do if you are HIGH RISK for COVID-19?  Marland Kitchen If you are having a medical emergency, call 911. . Seek medical care right away. Before you go to a doctor's office, urgent care or emergency department, call ahead and tell them about your recent travel, contact with someone diagnosed with COVID-19, and your symptoms. You should receive instructions from your physician's office regarding next steps of care.  . When you arrive at healthcare provider, tell the healthcare staff immediately you have returned from visiting [place], [place], [place], [place] or [place]; or traveled in the [place] to [place], [place], [place], or [place]; in the last two weeks or you have been in close contact with a person diagnosed with COVID-19 in the last 2 weeks.   . Tell the health care staff about your symptoms: fever, cough and shortness of breath. . After you have been seen by a medical provider, you will be either: o Tested for (COVID-19) and discharged home on quarantine except to seek medical care if symptoms worsen, and asked to  - Stay home and avoid contact with others until you get your results (4-5 days)  - Avoid travel on public transportation if possible (such as bus, train, or airplane) or o Sent to the Emergency Department by EMS for evaluation, COVID-19 testing, and possible admission depending on your condition and test results.  What to do if you are LOW RISK for COVID-19?  Reduce your risk of any infection by using the same  precautions used for avoiding the common cold or flu:  Marland Kitchen Wash your hands often with soap and warm water for at least 20 seconds.  If soap and water are not readily available, use an alcohol-based hand sanitizer with at least 60% alcohol.  . If coughing or sneezing, cover your mouth and nose by coughing or sneezing into the elbow areas of your shirt or coat, into a tissue or into your sleeve (not your hands). . Avoid shaking hands with others and consider head nods or verbal greetings only. . Avoid touching your eyes, nose, or mouth with unwashed hands.  . Avoid close contact with people who are sick. . Avoid places or events with large numbers of people in one location, like concerts or sporting events. . Carefully consider travel plans you have or are making. . If you are planning any travel outside or inside the [place], visit the CDC's Travelers' Health webpage for the latest health notices. . If you have some symptoms but not all symptoms, continue to monitor at home and seek medical attention if your symptoms worsen. . If you are having a medical emergency, call 911.   ADDITIONAL HEALTHCARE OPTIONS FOR PATIENTS  [facility] / e-Visit: [email or web address]         [facility]: [facility]: [phone]                   [facility]: [phone]

## 2019-09-10 NOTE — Patient Instructions (Signed)

## 2019-09-10 NOTE — Telephone Encounter (Signed)
Critical lab received at 1031: Potassium 2.9. Desk RN Diane made aware.

## 2019-09-11 ENCOUNTER — Telehealth: Payer: Self-pay | Admitting: Internal Medicine

## 2019-09-11 NOTE — Telephone Encounter (Signed)
No los per 10/21.

## 2019-09-16 ENCOUNTER — Telehealth: Payer: Self-pay | Admitting: Internal Medicine

## 2019-09-16 NOTE — Telephone Encounter (Signed)
Noted, thanks!

## 2019-09-16 NOTE — Telephone Encounter (Signed)
Called and spoke with  pharmacist at [facility]  She states that the pt told her that she wanted to stop the prolastin C therapy due to her dx of lung ca  Will forward to Dr Annamaria Boots to make him aware

## 2019-09-23 ENCOUNTER — Other Ambulatory Visit: Payer: Self-pay | Admitting: Internal Medicine

## 2019-09-23 ENCOUNTER — Other Ambulatory Visit: Payer: Self-pay | Admitting: Physician Assistant

## 2019-09-25 ENCOUNTER — Other Ambulatory Visit: Payer: Self-pay

## 2019-09-25 ENCOUNTER — Telehealth: Payer: Self-pay | Admitting: *Deleted

## 2019-09-25 NOTE — Telephone Encounter (Signed)
Patient called to request medication for pain.  She states that she has not other treatment options and that the pain some days is unbearable rating a 9 out of 10 in the spine, back, hips and abdomen.    She was offered single agent Taxol and today she states she doesn't want to proceed with that.  She was also offered Hospice and Palliative care and she told me today she isn't ready for that step yet.  Upon review see that Jessica Kindle, PA has been managing pain for several months.  Advised to reach out to her for refill and if that dosing is ineffective then proceeding with hospice referral that was recommended by Dr Julien Nordmann might be best as they could help manage her pain beyond what she has already been getting.  Patient expressed understanding and denies any further requests.  She will let us know when and if she is ready for Hospice care.

## 2019-09-26 ENCOUNTER — Ambulatory Visit (INDEPENDENT_AMBULATORY_CARE_PROVIDER_SITE_OTHER): Payer: Medicare Other | Admitting: *Deleted

## 2019-09-26 DIAGNOSIS — Z23 Encounter for immunization: Secondary | ICD-10-CM

## 2019-10-03 ENCOUNTER — Other Ambulatory Visit: Payer: Self-pay | Admitting: Internal Medicine

## 2019-10-06 ENCOUNTER — Telehealth: Payer: Self-pay | Admitting: Family Medicine

## 2019-10-21 DEATH — deceased

## 2020-02-09 IMAGING — CT CT CHEST W/ CM
2 of 3 series · 14 of 36 positions shown, 17 images · IV contrast (omnipaque)
Comparison: Abdominal series radiograph 07/06/2018

ADDENDUM:
These results were called by telephone at the time of interpretation
on 07/06/2018 at [DATE] to Dr. Herman, who verbally acknowledged
these results.
CLINICAL DATA: Elevated liver function tests.

EXAM:
CT CHEST WITH CONTRAST
TECHNIQUE: Multidetector CT imaging of the chest was performed during
intravenous contrast administration.
CONTRAST:  75mL OMNIPAQUE IOHEXOL 300 MG/ML  SOLN

[Series 2: axial st · axial · 0.69mm/px · z∈[+1192,+1454]mm · 11 of 155 slices shown, 14 images]
[im 12/155  mediastinal]
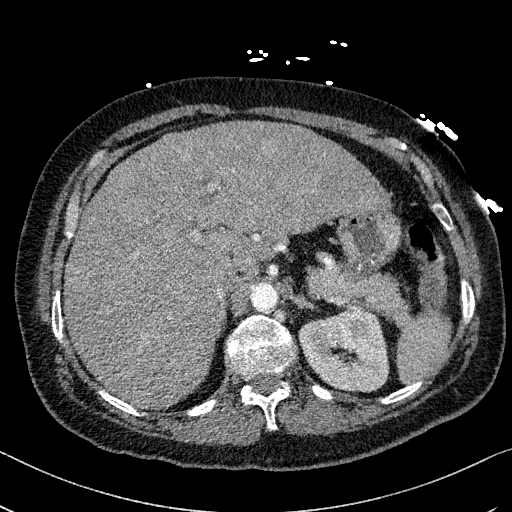
[im 12/155  lung]
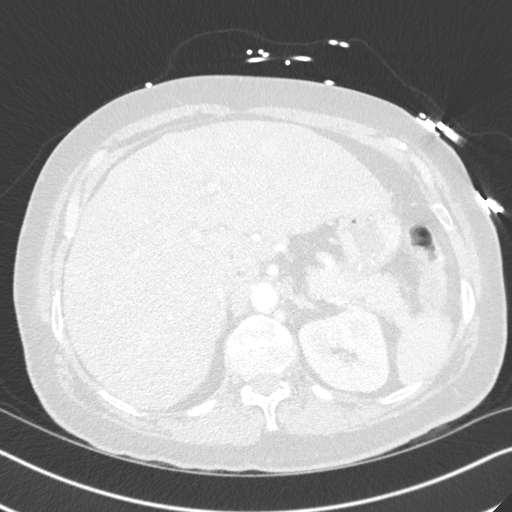
[im 23/155  lung]
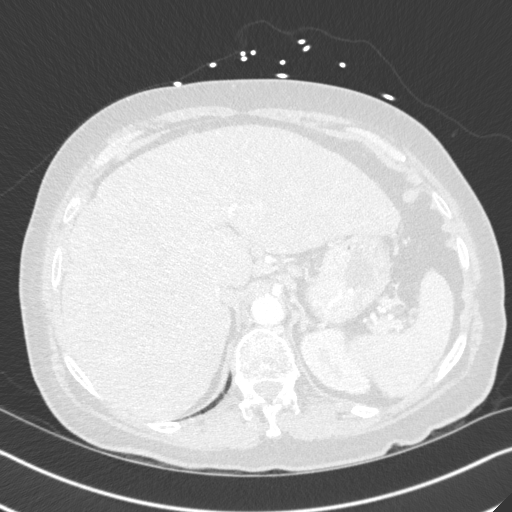
[im 35/155  lung]
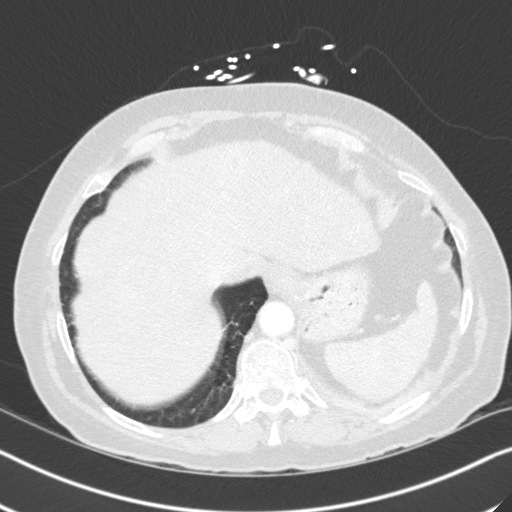
[im 52/155  lung]
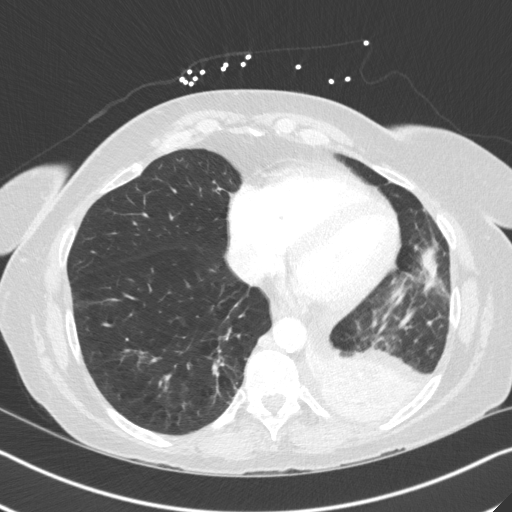
[im 63/155  mediastinal]
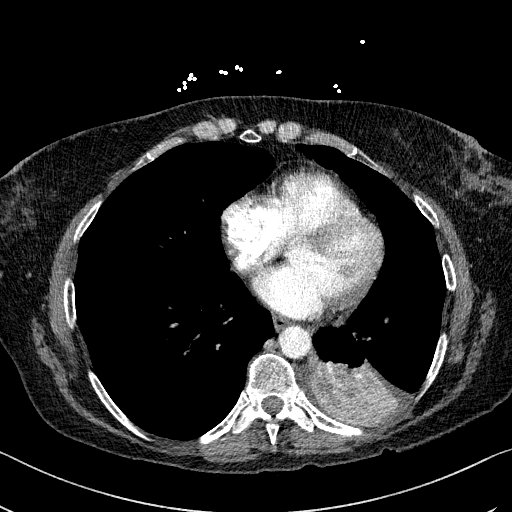
[im 63/155  lung]
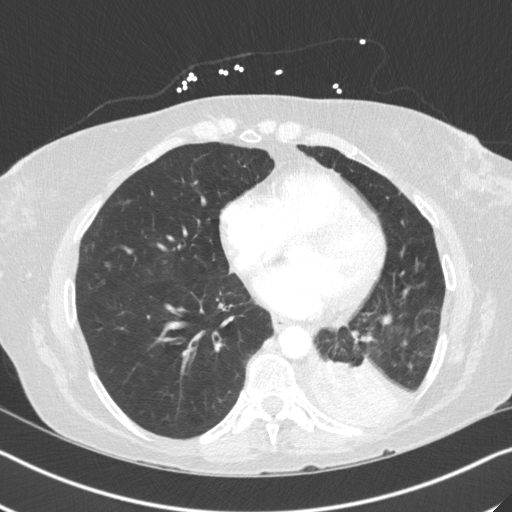
[im 80/155  lung]
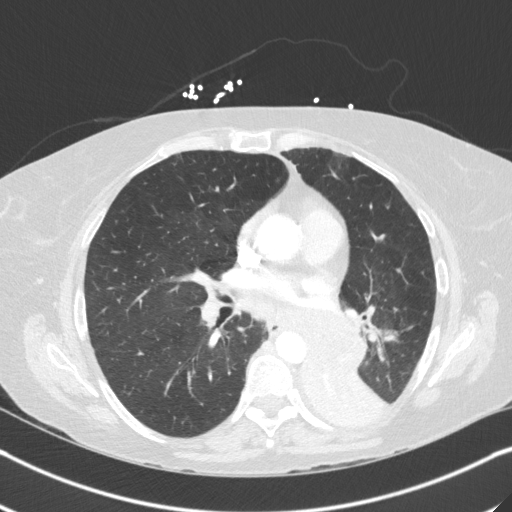
[im 92/155  lung]
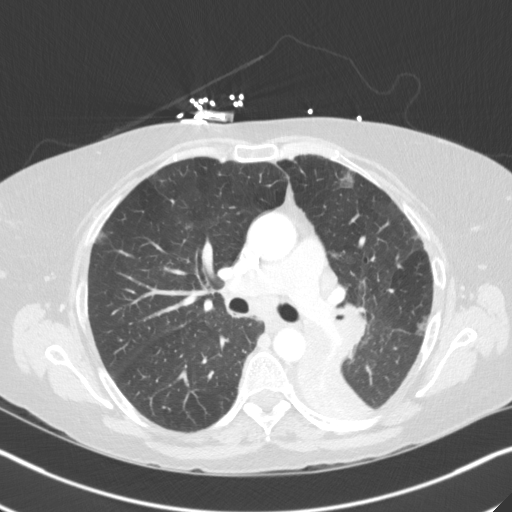
[im 103/155  lung]
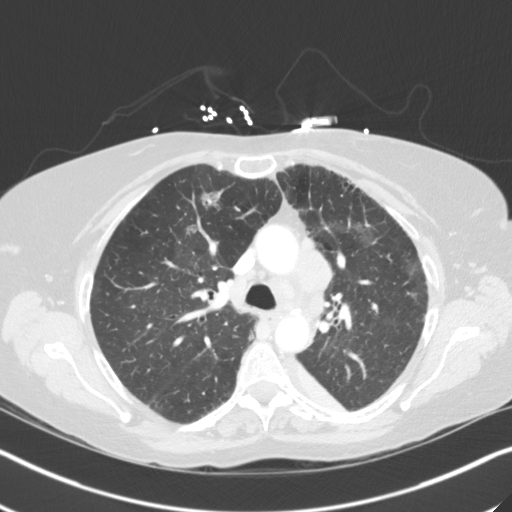
[im 120/155  mediastinal]
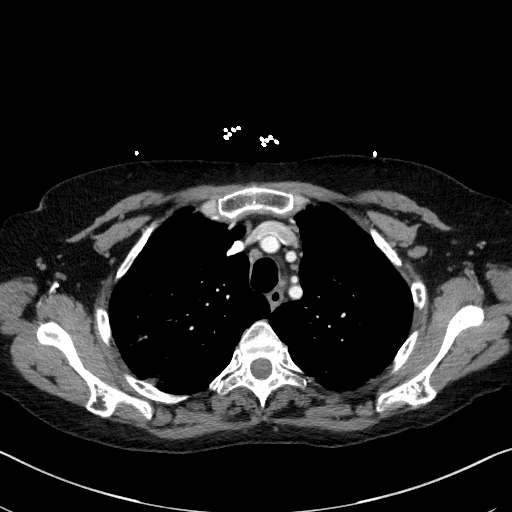
[im 120/155  lung]
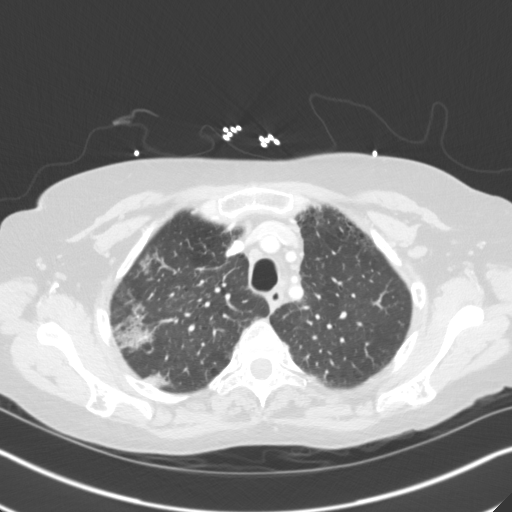
[im 132/155  lung]
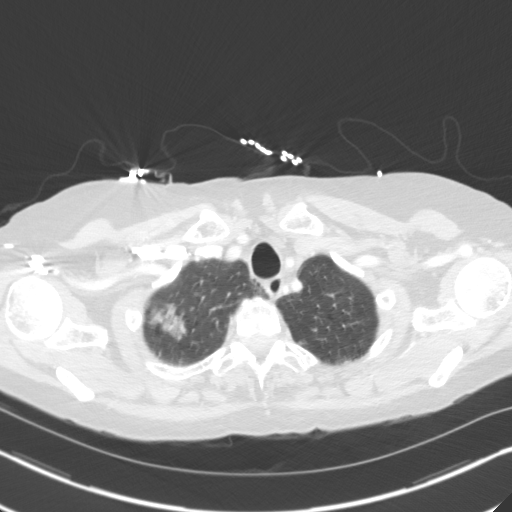
[im 143/155  lung]
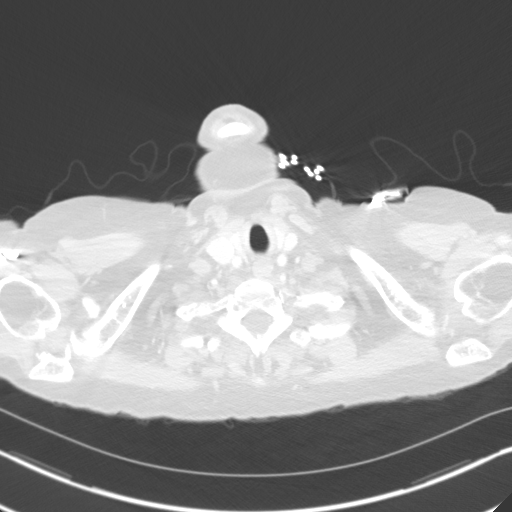

[Series 5: coronal · coronal · 0.60mm/px · 3 of 131 slices shown]
[im 27/131  lung]
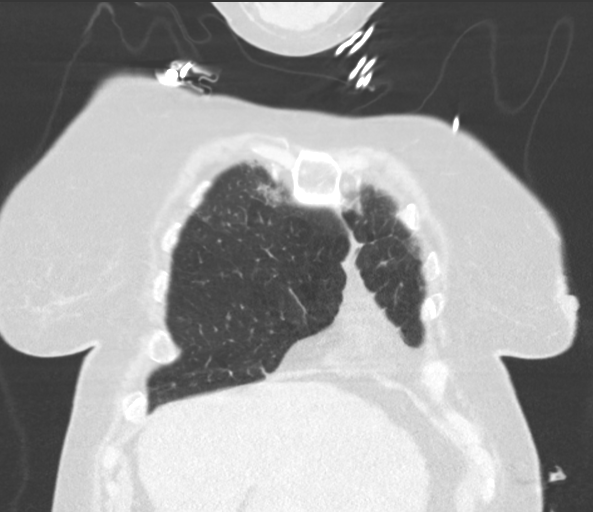
[im 53/131  lung]
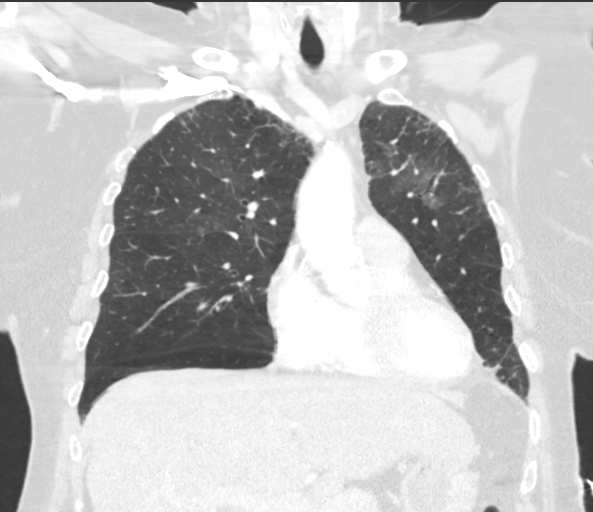
[im 79/131  lung]
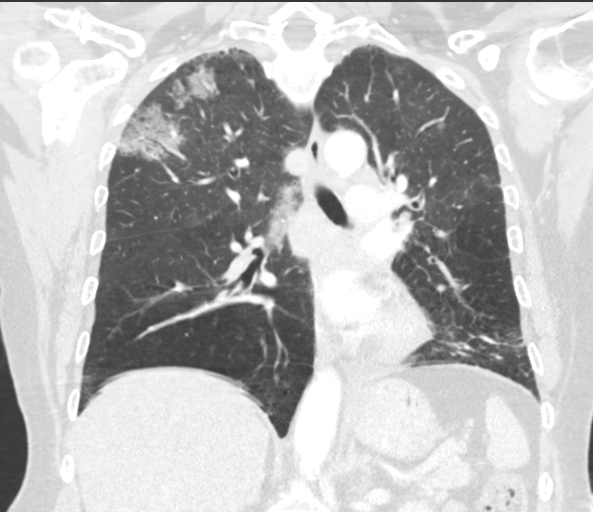

[14 of 36 positions shown; findings below may reference images not displayed]

FINDINGS: Cardiovascular: The heart is normal in size. No pericardial
effusion. Calcific atherosclerotic disease of the coronary arteries
and aorta, mild.

Mediastinum/Nodes: Left-sided mediastinal lymphadenopathy at all
lymph node station. Index abnormal lymph node in the AP window
measures 2.2 cm in short axis. Subcarinal lymphadenopathy is also
present. In the left hilar/subhilar region there is a soft tissue
mass versus conglomerate of abnormal lymph nodes which measures
approximately 6.0 x 3.1 x 5.8 cm. This mass causes compression of
the left lower lobar pulmonary arterial branches and left lower
lobar main bronchus, with subsequent complete collapse of the left
lower lobe. There is also mass effect to the lower esophagus.

Lungs/Pleura: Post compressive atelectasis of the left lower lobe.
Additionally, there are multiple areas of ground-glass opacity in
bilateral lungs. An index lesion in the periphery of the right upper
lobe measures 4.0 x 3.8 cm, image 42/155.

Upper Abdomen: Diffusely heterogeneous nodular appearance of the
liver with lobulated contours. 1.3 cm in short axis lymph node in
the gastrohepatic legs. Other small lymph nodes in porta hepatis.

Musculoskeletal: No chest wall abnormality. No acute or significant
osseous findings.
IMPRESSION: Predominantly left-sided mediastinal, hilar and subcarinal
lymphadenopathy. A 6 cm left hilar/subhilar mass may represent a
primary malignancy versus a conglomerate of abnormal lymph nodes. It
causes compression of the left lower lobe pulmonary arterial
branches, left lower lobe main bronchus and the lower esophagus.

Post compressive complete collapse of the left lower lobe.

Multiple areas of ground-glass opacity in bilateral lungs. The index
lesion in the periphery of the right upper lobe measures 4 cm. These
may represent infectious or inflammatory consolidation, however
bronchoalveolar carcinoma cannot be excluded.

Diffusely heterogeneous nodular appearance of the liver. This may
represent changes of cirrhosis or alternatively diffuse hepatic
metastatic disease.

Moderate lymphadenopathy in the upper abdomen.

Bronchoscopy with tissue sampling may be considered to arrive to a
histologic diagnosis.

Aortic Atherosclerosis ([id number]).
